# Patient Record
Sex: Female | Born: 1960 | Race: Black or African American | Hispanic: No | Marital: Single | Smoking: Former smoker
Health system: Southern US, Community
[De-identification: ages and names within clinical notes are randomized; demographics above are authoritative.]

## PROBLEM LIST (undated history)

## (undated) DIAGNOSIS — T4145XA Adverse effect of unspecified anesthetic, initial encounter: Secondary | ICD-10-CM

## (undated) DIAGNOSIS — F32A Depression, unspecified: Secondary | ICD-10-CM

## (undated) DIAGNOSIS — F329 Major depressive disorder, single episode, unspecified: Secondary | ICD-10-CM

## (undated) DIAGNOSIS — M797 Fibromyalgia: Secondary | ICD-10-CM

## (undated) DIAGNOSIS — I38 Endocarditis, valve unspecified: Secondary | ICD-10-CM

## (undated) DIAGNOSIS — Z8614 Personal history of Methicillin resistant Staphylococcus aureus infection: Secondary | ICD-10-CM

## (undated) DIAGNOSIS — Z923 Personal history of irradiation: Secondary | ICD-10-CM

## (undated) DIAGNOSIS — K219 Gastro-esophageal reflux disease without esophagitis: Secondary | ICD-10-CM

## (undated) DIAGNOSIS — G4730 Sleep apnea, unspecified: Secondary | ICD-10-CM

## (undated) DIAGNOSIS — J439 Emphysema, unspecified: Secondary | ICD-10-CM

## (undated) DIAGNOSIS — M179 Osteoarthritis of knee, unspecified: Secondary | ICD-10-CM

## (undated) DIAGNOSIS — C7951 Secondary malignant neoplasm of bone: Secondary | ICD-10-CM

## (undated) DIAGNOSIS — R918 Other nonspecific abnormal finding of lung field: Secondary | ICD-10-CM

## (undated) DIAGNOSIS — T8859XA Other complications of anesthesia, initial encounter: Secondary | ICD-10-CM

## (undated) DIAGNOSIS — L309 Dermatitis, unspecified: Secondary | ICD-10-CM

## (undated) DIAGNOSIS — I700 Atherosclerosis of aorta: Secondary | ICD-10-CM

## (undated) DIAGNOSIS — J90 Pleural effusion, not elsewhere classified: Secondary | ICD-10-CM

## (undated) DIAGNOSIS — I10 Essential (primary) hypertension: Secondary | ICD-10-CM

## (undated) DIAGNOSIS — F419 Anxiety disorder, unspecified: Secondary | ICD-10-CM

## (undated) DIAGNOSIS — R011 Cardiac murmur, unspecified: Secondary | ICD-10-CM

## (undated) DIAGNOSIS — R6889 Other general symptoms and signs: Secondary | ICD-10-CM

## (undated) DIAGNOSIS — R0602 Shortness of breath: Secondary | ICD-10-CM

## (undated) DIAGNOSIS — M1710 Unilateral primary osteoarthritis, unspecified knee: Secondary | ICD-10-CM

## (undated) DIAGNOSIS — A159 Respiratory tuberculosis unspecified: Secondary | ICD-10-CM

## (undated) DIAGNOSIS — D649 Anemia, unspecified: Secondary | ICD-10-CM

## (undated) HISTORY — DX: Cardiac murmur, unspecified: R01.1

## (undated) HISTORY — PX: THORACENTESIS: SHX235

## (undated) HISTORY — DX: Other general symptoms and signs: R68.89

## (undated) HISTORY — DX: Personal history of irradiation: Z92.3

## (undated) HISTORY — DX: Secondary malignant neoplasm of bone: C79.51

## (undated) HISTORY — DX: Gastro-esophageal reflux disease without esophagitis: K21.9

## (undated) HISTORY — DX: Pleural effusion, not elsewhere classified: J90

## (undated) HISTORY — DX: Essential (primary) hypertension: I10

---

## 2000-03-22 HISTORY — PX: TUBAL LIGATION: SHX77

## 2006-12-06 ENCOUNTER — Other Ambulatory Visit: Admission: RE | Admit: 2006-12-06 | Discharge: 2006-12-06 | Payer: Self-pay | Admitting: *Deleted

## 2008-11-20 LAB — CONVERTED CEMR LAB

## 2009-12-05 ENCOUNTER — Ambulatory Visit: Payer: Self-pay | Admitting: Family Medicine

## 2009-12-05 DIAGNOSIS — K219 Gastro-esophageal reflux disease without esophagitis: Secondary | ICD-10-CM

## 2009-12-05 DIAGNOSIS — R011 Cardiac murmur, unspecified: Secondary | ICD-10-CM

## 2009-12-05 DIAGNOSIS — I10 Essential (primary) hypertension: Secondary | ICD-10-CM

## 2009-12-05 DIAGNOSIS — E663 Overweight: Secondary | ICD-10-CM

## 2010-04-11 ENCOUNTER — Encounter: Payer: Self-pay | Admitting: Obstetrics and Gynecology

## 2010-04-21 NOTE — Assessment & Plan Note (Signed)
Summary: NEW PT EST (PT REQ CPX W/ PAP) // RS   Vital Signs:  Patient profile:   50 year old female Menstrual status:  regular LMP:     11/17/2009 Height:      66 inches (167.64 cm) Weight:      283 pounds (128.64 kg) BMI:     45.84 O2 Sat:      98 % on Room air Temp:     98.3 degrees F (36.83 degrees C) oral Pulse rate:   102 / minute BP sitting:   124 / 92  (left arm) Cuff size:   large  Vitals Entered By: Josph Macho RMA (December 05, 2009 11:57 AM)  O2 Flow:  Room air CC: Establish new Patient/ physical w/pap/ CF Is Patient Diabetic? No LMP (date): 11/17/2009     Menstrual Status regular Enter LMP: 11/17/2009 Last PAP Result historical   Current Medications (verified): 1)  Nexium 40 Mg Cpdr (Esomeprazole Magnesium) .... Once Daily 2)  Hydrochlorothiazide 25 Mg Tabs (Hydrochlorothiazide) .... Once Daily  Allergies (verified): No Known Drug Allergies   Impression & Recommendations: patient left before exam or completion of visit.  Complete Medication List: 1)  Nexium 40 Mg Cpdr (Esomeprazole magnesium) .... Once daily 2)  Hydrochlorothiazide 25 Mg Tabs (Hydrochlorothiazide) .... Once daily  Other Orders: No Charge Patient Arrived (NCPA0) (NCPA0)  Preventive Care Screening  Pap Smear:    Date:  11/20/2008    Results:  historical   Last Tetanus Booster:    Date:  03/22/2005    Results:  Historical   Mammogram:    Date:  03/23/2003    Results:  historical

## 2010-04-22 ENCOUNTER — Encounter: Payer: Self-pay | Admitting: Obstetrics and Gynecology

## 2010-04-27 ENCOUNTER — Other Ambulatory Visit: Payer: Self-pay | Admitting: Obstetrics & Gynecology

## 2010-04-27 DIAGNOSIS — R928 Other abnormal and inconclusive findings on diagnostic imaging of breast: Secondary | ICD-10-CM

## 2010-10-28 ENCOUNTER — Encounter: Payer: Self-pay | Admitting: Family Medicine

## 2010-10-28 ENCOUNTER — Ambulatory Visit (INDEPENDENT_AMBULATORY_CARE_PROVIDER_SITE_OTHER): Payer: 59 | Admitting: Family Medicine

## 2010-10-28 VITALS — BP 138/96 | HR 120 | Temp 98.6°F | Resp 12 | Ht 65.5 in | Wt 273.0 lb

## 2010-10-28 DIAGNOSIS — M797 Fibromyalgia: Secondary | ICD-10-CM

## 2010-10-28 DIAGNOSIS — K219 Gastro-esophageal reflux disease without esophagitis: Secondary | ICD-10-CM

## 2010-10-28 DIAGNOSIS — IMO0001 Reserved for inherently not codable concepts without codable children: Secondary | ICD-10-CM

## 2010-10-28 DIAGNOSIS — J45909 Unspecified asthma, uncomplicated: Secondary | ICD-10-CM

## 2010-10-28 DIAGNOSIS — I10 Essential (primary) hypertension: Secondary | ICD-10-CM

## 2010-10-28 MED ORDER — PREDNISONE 10 MG PO TABS: ORAL_TABLET | ORAL | Status: AC

## 2010-10-28 NOTE — Progress Notes (Signed)
  Subjective:    Patient ID: Beth [name], female    DOB: 1960/08/12, 50 y.o.   MRN: [id number]  HPI New patient to establish care. Past medical history reviewed. Mild intermittent asthma, GERD, history of heart murmur which she states has been diagnosed with mitral valve prolapse, hypertension, and recently diagnosed fibromyalgia. She is seeing rheumatologist and placed on some type of muscle relaxer. No consistent exercise. All medications reviewed. Blood pressures marginal control. Takes Singulair for asthma. Currently has some wheezing for past few days. Previously been on some type of inhaler. She does smoke about 2 cigarettes per day.  Family history significant for father alcoholism. Hypertension in several members.  Patient is divorced. One grown child. She works full time. Smoking history as above. One glass of wine per day.   Review of Systems  Constitutional: Positive for fatigue. Negative for fever, activity change, appetite change and unexpected weight change.  HENT: Negative for sore throat and trouble swallowing.   Respiratory: Positive for wheezing. Negative for cough and shortness of breath.   Cardiovascular: Negative for chest pain, palpitations and leg swelling.  Gastrointestinal: Negative for abdominal pain and blood in stool.  Genitourinary: Negative for dysuria.  Musculoskeletal: Positive for myalgias. Negative for back pain, joint swelling, arthralgias and gait problem.  Neurological: Negative for dizziness, syncope, weakness and headaches.       Objective:   Physical Exam  Constitutional: She is oriented to person, place, and time. She appears well-developed and well-nourished. No distress.  HENT:  Right Ear: External ear normal.  Left Ear: External ear normal.  Mouth/Throat: Oropharynx is clear and moist.  Eyes: Pupils are equal, round, and reactive to light.  Neck: Neck supple. No thyromegaly present.  Cardiovascular: Normal rate and regular rhythm.  Exam  reveals no gallop.        Patient is murmur heard best at the left sternal border. Holosystolic and 3/6  Pulmonary/Chest: Effort normal and breath sounds normal. No respiratory distress. She has no wheezes. She has no rales.  Musculoskeletal: She exhibits no edema.  Lymphadenopathy:    She has no cervical adenopathy.  Neurological: She is alert and oriented to person, place, and time. No cranial nerve deficit.  Psychiatric: She has a normal mood and affect. Her behavior is normal.          Assessment & Plan:  #1 history of fibromyalgia. Patient requesting forms completed for work for periodic leave secondary medical issues. These are completed. We discussed importance of regular physical activity and exercise #2 asthma with current mild exacerbation. Prednisone taper written. Consider steroid inhaler if this becomes more frequent #3 history of reported mitral valve prolapse #4 history of GERD controlled and stable #5 hypertension stable. Needs to work on weight loss #6 health maintenance. Schedule complete physical 3 months

## 2010-10-30 ENCOUNTER — Telehealth: Payer: Self-pay | Admitting: Family Medicine

## 2010-10-30 NOTE — Telephone Encounter (Signed)
This WILL be completed by Monday.

## 2010-10-30 NOTE — Telephone Encounter (Signed)
Pt was following up on FMLA paper work. Pt requesting paper work by August 26.

## 2010-11-05 DIAGNOSIS — Z0279 Encounter for issue of other medical certificate: Secondary | ICD-10-CM

## 2010-11-27 ENCOUNTER — Telehealth: Payer: Self-pay | Admitting: Family Medicine

## 2010-11-27 MED ORDER — PREDNISONE 10 MG PO TABS: 10.0000 mg | ORAL_TABLET | ORAL | Status: AC

## 2010-11-27 NOTE — Telephone Encounter (Signed)
Prednisone taper 10 mg :  4-4-4-3-3-2-2-1 and office follow up if no better.

## 2010-11-27 NOTE — Telephone Encounter (Signed)
Pt.notified

## 2010-11-27 NOTE — Telephone Encounter (Signed)
Pt is asking if Dr Caryl Never will call in Prednisone for an asthma flare.

## 2010-12-29 ENCOUNTER — Telehealth: Payer: Self-pay | Admitting: Family Medicine

## 2010-12-29 NOTE — Telephone Encounter (Signed)
Pt has Hx of 5 mg and 10 mg disp on 10-28-10 and 11-27-10?

## 2010-12-29 NOTE — Telephone Encounter (Signed)
Pt informed on VM phone

## 2010-12-29 NOTE — Telephone Encounter (Signed)
Pt requesting refill on predniSONE (DELTASONE) 5 MG tablet    [facility]

## 2010-12-29 NOTE — Telephone Encounter (Signed)
Needs office follow up.  If requiring repeat prednisone will likely need steroid inhaler.  Schedule to discuss.

## 2011-01-12 ENCOUNTER — Telehealth: Payer: Self-pay | Admitting: Family Medicine

## 2011-01-12 NOTE — Telephone Encounter (Signed)
Only albuterol inhalers are acute relievers (eg ProAir, Ventolin, Proventil).  OK to give her one sample if available. Qvar, Pulmicort, Advair are daily controller medications.  Whether to use meds like Qvar hinges on severity of her asthma and I do not have a good feel at this time.  She really needs office visit to reassess asthma as soon as possible when she gets her insurance.

## 2011-01-12 NOTE — Telephone Encounter (Signed)
I spoke with pt, has hx of asthma and [facility] had treated her with Ventolin Inhaler.  Currently she is supposed to be taking Singuliar, however she ran out several weeks ago.  She does not have any insurance until Jan 21, 2011.  I asked her to call her pharmacy and ask how much 10-15 pills would cost out of pocket.  Pt requesting a sample inhaller, samples available here are Qvar, Pulmicort and Advair 100/50

## 2011-01-12 NOTE — Telephone Encounter (Signed)
Pt called and is req sample ventilator for asthma. Pt has sch an ov for 11/1 re: asthma and bronchitus issues. Pt did not want to come in any sooner.

## 2011-01-13 NOTE — Telephone Encounter (Signed)
Pt informed, she did get Singuliar #30 for $9.00, so she is all set until OV

## 2011-01-21 ENCOUNTER — Ambulatory Visit (INDEPENDENT_AMBULATORY_CARE_PROVIDER_SITE_OTHER): Payer: 59 | Admitting: Family Medicine

## 2011-01-21 ENCOUNTER — Encounter: Payer: Self-pay | Admitting: Family Medicine

## 2011-01-21 VITALS — BP 130/96 | Temp 98.4°F | Wt 258.0 lb

## 2011-01-21 DIAGNOSIS — R059 Cough, unspecified: Secondary | ICD-10-CM

## 2011-01-21 DIAGNOSIS — I10 Essential (primary) hypertension: Secondary | ICD-10-CM

## 2011-01-21 DIAGNOSIS — K219 Gastro-esophageal reflux disease without esophagitis: Secondary | ICD-10-CM

## 2011-01-21 DIAGNOSIS — Z23 Encounter for immunization: Secondary | ICD-10-CM

## 2011-01-21 DIAGNOSIS — Z8659 Personal history of other mental and behavioral disorders: Secondary | ICD-10-CM

## 2011-01-21 DIAGNOSIS — R05 Cough: Secondary | ICD-10-CM

## 2011-01-21 MED ORDER — VENLAFAXINE HCL ER 150 MG PO TB24: 150.0000 mg | ORAL_TABLET | Freq: Every day | ORAL | Status: DC

## 2011-01-21 NOTE — Progress Notes (Signed)
  Subjective:    Patient ID: Beth [name], female    DOB: 09/15/1960, 50 y.o.   MRN: [id number]  HPI  Here for several issues. History of asthma. She recently quit smoking 2 weeks ago. Has continued dry cough. Singulair helps but still has frequent night coughing and she is aware of some wheezing at night. No dyspnea with activity. No history of steroid inhaler use.  Hypertension. Takes HCTZ. Currently in process of losing some weight and lost about 15 pounds this last August.  History reflux. As she's lost weight reflux has improved. She was able to taper off Nexium recently.  History of depression. Currently stable. Takes generic Effexor and requesting refills. She also has history of fibromyalgia and this seems to help with her fibromyalgia pain.   Review of Systems  Constitutional: Negative for fever and chills.  HENT: Negative for ear pain.   Respiratory: Positive for cough and wheezing. Negative for shortness of breath.   Cardiovascular: Negative for chest pain, palpitations and leg swelling.  Neurological: Negative for dizziness and headaches.       Objective:   Physical Exam  Constitutional: She is oriented to person, place, and time. She appears well-developed and well-nourished.  Neck: Neck supple. No thyromegaly present.  Cardiovascular: Normal rate and regular rhythm.   No murmur heard. Pulmonary/Chest: Effort normal and breath sounds normal. No respiratory distress. She has no wheezes. She has no rales.  Musculoskeletal: She exhibits no edema.  Lymphadenopathy:    She has no cervical adenopathy.  Neurological: She is alert and oriented to person, place, and time.          Assessment & Plan:  #1 hypertension. Mildly elevated today. Did not take blood pressure medication. Should continue to improve with weight loss. No additional meds at this time. Reassess at physical in 2 months #2 history of asthma. Probably moderate persistent. Add Pulmicort 180 mg one puff  twice daily with instructions given. Continue Singulair. Reassess at followup #3 history of GERD stable and improved. Continue tapering off Nexium  #4 history of depression stable refill generic Effexor for one year

## 2011-01-21 NOTE — Patient Instructions (Signed)
Pulmicort one puff twice daily and rinse mouth after use.

## 2011-01-28 ENCOUNTER — Ambulatory Visit: Payer: 59 | Admitting: Family Medicine

## 2011-03-11 ENCOUNTER — Telehealth: Payer: Self-pay | Admitting: Family Medicine

## 2011-03-11 MED ORDER — ESOMEPRAZOLE MAGNESIUM 40 MG PO CPDR: 40.0000 mg | DELAYED_RELEASE_CAPSULE | Freq: Every day | ORAL | Status: DC

## 2011-03-11 NOTE — Telephone Encounter (Signed)
Pt req full script of Nexium called in to [facility] [address].

## 2011-03-17 ENCOUNTER — Other Ambulatory Visit: Payer: 59

## 2011-03-23 ENCOUNTER — Encounter (HOSPITAL_COMMUNITY): Payer: Self-pay | Admitting: *Deleted

## 2011-03-23 ENCOUNTER — Emergency Department (HOSPITAL_COMMUNITY)
Admission: EM | Admit: 2011-03-23 | Discharge: 2011-03-23 | Disposition: A | Discharge status: Home / Self Care | Payer: 59 | Source: Home / Self Care | Attending: Emergency Medicine | Admitting: Emergency Medicine

## 2011-03-23 ENCOUNTER — Emergency Department (INDEPENDENT_AMBULATORY_CARE_PROVIDER_SITE_OTHER): Payer: 59

## 2011-03-23 DIAGNOSIS — M25461 Effusion, right knee: Secondary | ICD-10-CM

## 2011-03-23 DIAGNOSIS — M25469 Effusion, unspecified knee: Secondary | ICD-10-CM

## 2011-03-23 HISTORY — DX: Unilateral primary osteoarthritis, unspecified knee: M17.10

## 2011-03-23 HISTORY — DX: Fibromyalgia: M79.7

## 2011-03-23 HISTORY — DX: Osteoarthritis of knee, unspecified: M17.9

## 2011-03-23 MED ORDER — IBUPROFEN 800 MG PO TABS: 800.0000 mg | ORAL_TABLET | Freq: Once | ORAL | Status: DC

## 2011-03-23 MED ORDER — ACETAMINOPHEN 500 MG PO TABS: 500.0000 mg | ORAL_TABLET | Freq: Four times a day (QID) | ORAL | Status: AC | PRN

## 2011-03-23 MED ORDER — IBUPROFEN 600 MG PO TABS: 600.0000 mg | ORAL_TABLET | Freq: Four times a day (QID) | ORAL | Status: DC | PRN

## 2011-03-23 MED ORDER — HYDROCODONE-ACETAMINOPHEN 5-325 MG PO TABS: 2.0000 | ORAL_TABLET | ORAL | Status: AC | PRN

## 2011-03-23 MED ORDER — IBUPROFEN 800 MG PO TABS
ORAL_TABLET | ORAL | Status: AC
  Filled 2011-03-23: qty 1

## 2011-03-23 NOTE — ED Provider Notes (Signed)
History     CSN: [id number]  Arrival date & time 03/23/11  1655   First MD Initiated Contact with Patient 03/23/11 1722      Chief Complaint  Patient presents with  . Knee Pain    HPI Comments: Pt with right knee swelling, achy dull nonradiating posterior patellar pain x 5 days. States just got a new dog and has been walking him frequently which is an increase in her normal physical activity. Went for long walk other day and last night c/o worsening of knee pain. C/o "popping" in knee espeially with walking. Tried 400 mg ibu w/o relief last night, BC powder today. No N/V, fevers, numbness, weakness, giving way. H/o injury to this knee told she had DJD. No h/o DM, steriod use.   Patient is a 51 y.o. female presenting with knee pain. The history is provided by the patient.  Knee Pain This is a new problem. The current episode started more than 2 days ago. The problem occurs constantly. The problem has been gradually worsening. The symptoms are aggravated by walking. The symptoms are relieved by nothing.    Past Medical History  Diagnosis Date  . Asthma   . GERD (gastroesophageal reflux disease)   . Hypertension   . Heart murmur   . Fibromyalgia   . Arthritis of knee, degenerative     History reviewed. No pertinent past surgical history.  Family History  Problem Relation Age of Onset  . Hypertension Mother   . Alcohol abuse Father   . Hypertension Maternal Aunt   . Hypertension Maternal Uncle   . Hypertension Maternal Grandmother   . Hypertension Maternal Grandfather     History  Substance Use Topics  . Smoking status: Former Smoker -- 0.5 packs/day for 15 years    Types: Cigarettes    Quit date: 01/01/2011  . Smokeless tobacco: Not on file  . Alcohol Use: No    OB History    Grav Para Term Preterm Abortions TAB SAB Ect Mult Living                  Review of Systems  Constitutional: Negative for fever.  Gastrointestinal: Negative for nausea.  Musculoskeletal:  Positive for joint swelling and arthralgias.  Skin: Negative for color change and rash.  Neurological: Negative for weakness and numbness.    Allergies  Mobic  Home Medications   Current Outpatient Rx  Name Route Sig Dispense Refill  . ESOMEPRAZOLE MAGNESIUM 40 MG PO CPDR Oral Take 1 capsule (40 mg total) by mouth daily before breakfast. 30 capsule 11  . HYDROCHLOROTHIAZIDE 25 MG PO TABS Oral Take 25 mg by mouth daily.     Marland Kitchen MONTELUKAST SODIUM 10 MG PO TABS Oral Take 10 mg by mouth at bedtime.      . VENLAFAXINE HCL ER 150 MG PO TB24 Oral Take 1 tablet (150 mg total) by mouth daily. 30 each 11  . ACETAMINOPHEN 500 MG PO TABS Oral Take 1 tablet (500 mg total) by mouth every 6 (six) hours as needed for pain. 30 tablet 0  . HYDROCODONE-ACETAMINOPHEN 5-325 MG PO TABS Oral Take 2 tablets by mouth every 4 (four) hours as needed for pain. 20 tablet 0  . IBUPROFEN 600 MG PO TABS Oral Take 1 tablet (600 mg total) by mouth every 6 (six) hours as needed for pain. 30 tablet 0    BP 137/82  Pulse 100  Temp(Src) 98.1 F (36.7 C) (Oral)  Resp 18  SpO2  100%  LMP 03/06/2011  Physical Exam  Nursing note and vitals reviewed. Constitutional: She is oriented to person, place, and time. She appears well-developed and well-nourished. No distress.  HENT:  Head: Normocephalic and atraumatic.  Eyes: EOM are normal. Pupils are equal, round, and reactive to light.  Neck: Normal range of motion.  Cardiovascular: Regular rhythm.   Pulmonary/Chest: Effort normal and breath sounds normal.  Musculoskeletal: Normal range of motion.       Right Knee ROM baseline for Pt, Flexion/extension  intact, Tenderness along patellar retinaculum Patella NT, Patellar apprehension test negative, Patellar tendon NT, Medial joint mildly tender, Lateral joint NT, Popliteal region NT, Lachman's stable, Varus stress testing stable, Valgus stress testing stable, McMurray's testing normal, distal NVI with intact baseline sensation /  motor / pulse distal to knee. (+) effusion.    Neurological: She is alert and oriented to person, place, and time.  Skin: Skin is warm and dry.  Psychiatric: She has a normal mood and affect. Her behavior is normal. Judgment and thought content normal.    ED Course  Procedures (including critical care time)  Labs Reviewed - No data to display Dg Knee Complete 4 Views Right  03/23/2011  *RADIOLOGY REPORT*  Clinical Data: Post patellar knee pain.  Rule out effusion, fracture.  Pain but no trauma.  Question of fluid per patient.  RIGHT KNEE - COMPLETE 4+ VIEW  Comparison: None.  Findings: There is a large joint effusion.  Moderate degenerative changes involve the medial, lateral, and patellofemoral compartments.  No evidence for acute fracture or subluxation.  IMPRESSION:  1.  Moderate degenerative changes. 2.  Large joint effusion. 3.  No evidence for fracture.  Original Report Authenticated By: Patterson Hammersmith, M.D.     1. Knee effusion, right       MDM  Suspect traumatic effusion with increased physical activity and h/o DJD in this knee. No fevers, minimal erythema, pt able to tolerate AROM and PROM, septic joint unlikely. No h/o gout. Checking XR to r/o fx.  Imaging reviewed by myself. Report per radiologist. Discussed imaging with pt. Applied knee immobilizer, crutches WBAT, instructed pt on ice, nsaid/ norco prn, and f/u with ortho Dr. Shon Baton on call, or [facility] in 7 days if no improvement. Pt agrees with plan   Luiz Blare, MD 03/23/11 2218

## 2011-03-23 NOTE — ED Notes (Signed)
Pt with onset of right knee pain x 5 days swelling onset last night with increased pain

## 2011-03-24 ENCOUNTER — Encounter: Payer: 59 | Admitting: Family Medicine

## 2011-04-01 ENCOUNTER — Other Ambulatory Visit: Payer: Self-pay | Admitting: Family Medicine

## 2011-04-01 ENCOUNTER — Ambulatory Visit: Payer: 59 | Admitting: Family Medicine

## 2011-04-06 ENCOUNTER — Other Ambulatory Visit: Payer: Self-pay | Admitting: Family Medicine

## 2011-04-06 NOTE — Telephone Encounter (Signed)
Pt would like a refill on ibuprofen 600 mg for pain call [facility] [phone]. Pt was prescribed med at [facility] on 03-23-2011.

## 2011-04-06 NOTE — Telephone Encounter (Signed)
May refill Ibuprofen 600 mg po q 6 hours prn pain #60 with no refill.

## 2011-04-07 ENCOUNTER — Other Ambulatory Visit: Payer: Self-pay | Admitting: *Deleted

## 2011-04-07 MED ORDER — IBUPROFEN 600 MG PO TABS: 600.0000 mg | ORAL_TABLET | Freq: Four times a day (QID) | ORAL | Status: DC | PRN

## 2011-04-07 NOTE — Telephone Encounter (Signed)
Pt left a VM for me also, this was filled earlier today #30 with 0 refills

## 2011-04-12 ENCOUNTER — Ambulatory Visit (INDEPENDENT_AMBULATORY_CARE_PROVIDER_SITE_OTHER): Payer: 59 | Admitting: Family Medicine

## 2011-04-12 ENCOUNTER — Encounter: Payer: Self-pay | Admitting: Family Medicine

## 2011-04-12 VITALS — BP 110/80 | HR 80 | Temp 98.3°F | Resp 12 | Ht 66.0 in | Wt 250.0 lb

## 2011-04-12 DIAGNOSIS — J45909 Unspecified asthma, uncomplicated: Secondary | ICD-10-CM

## 2011-04-12 DIAGNOSIS — I10 Essential (primary) hypertension: Secondary | ICD-10-CM

## 2011-04-12 DIAGNOSIS — J4520 Mild intermittent asthma, uncomplicated: Secondary | ICD-10-CM

## 2011-04-12 DIAGNOSIS — Z0000 Encounter for general adult medical examination without abnormal findings: Secondary | ICD-10-CM

## 2011-04-12 LAB — HEPATIC FUNCTION PANEL
ALT: 11 U/L (ref 0–35)
AST: 14 U/L (ref 0–37)
Albumin: 3.5 g/dL (ref 3.5–5.2)
Alkaline Phosphatase: 91 U/L (ref 39–117)
Bilirubin, Direct: 0 mg/dL (ref 0.0–0.3)
Total Bilirubin: 0.3 mg/dL (ref 0.3–1.2)
Total Protein: 7.7 g/dL (ref 6.0–8.3)

## 2011-04-12 LAB — CBC WITH DIFFERENTIAL/PLATELET
Basophils Absolute: 0 K/uL (ref 0.0–0.1)
Basophils Relative: 0.5 % (ref 0.0–3.0)
Eosinophils Absolute: 0.4 K/uL (ref 0.0–0.7)
Eosinophils Relative: 5.2 % — ABNORMAL HIGH (ref 0.0–5.0)
HCT: 31.2 % — ABNORMAL LOW (ref 36.0–46.0)
Hemoglobin: 10.4 g/dL — ABNORMAL LOW (ref 12.0–15.0)
Lymphocytes Relative: 30.5 % (ref 12.0–46.0)
Lymphs Abs: 2.1 K/uL (ref 0.7–4.0)
MCHC: 33.5 g/dL (ref 30.0–36.0)
MCV: 88.6 fl (ref 78.0–100.0)
Monocytes Absolute: 0.5 K/uL (ref 0.1–1.0)
Monocytes Relative: 7.9 % (ref 3.0–12.0)
Neutro Abs: 3.8 K/uL (ref 1.4–7.7)
Neutrophils Relative %: 55.9 % (ref 43.0–77.0)
Platelets: 360 K/uL (ref 150.0–400.0)
RBC: 3.52 Mil/uL — ABNORMAL LOW (ref 3.87–5.11)
RDW: 14.9 % — ABNORMAL HIGH (ref 11.5–14.6)
WBC: 6.8 K/uL (ref 4.5–10.5)

## 2011-04-12 LAB — BASIC METABOLIC PANEL WITH GFR
BUN: 15 mg/dL (ref 6–23)
CO2: 26 meq/L (ref 19–32)
Calcium: 8.9 mg/dL (ref 8.4–10.5)
Chloride: 106 meq/L (ref 96–112)
Creatinine, Ser: 0.6 mg/dL (ref 0.4–1.2)
GFR: 110.39 mL/min
Glucose, Bld: 90 mg/dL (ref 70–99)
Potassium: 3.4 meq/L — ABNORMAL LOW (ref 3.5–5.1)
Sodium: 140 meq/L (ref 135–145)

## 2011-04-12 LAB — LIPID PANEL
HDL: 52.6 mg/dL (ref >39.00)
Total CHOL/HDL Ratio: 3
Triglycerides: 39 mg/dL (ref 0.0–149.0)

## 2011-04-12 MED ORDER — CYCLOBENZAPRINE HCL 5 MG PO TABS: 5.0000 mg | ORAL_TABLET | Freq: Every evening | ORAL | Status: AC | PRN

## 2011-04-12 NOTE — Progress Notes (Signed)
Subjective:    Patient ID: Beth [name], female    DOB: 1960-07-16, 51 y.o.   MRN: [id number]  HPI  Patient sent for complete physical. She continues to see gynecologist regularly and plans to schedule followup soon. Her chronic problems include history of obesity, hypertension, GERD, fibromyalgia, and history of depression. She is requesting forms be completed for periodic absence from work for flareups of her fibromyalgia. She recently required missing 3 days earlier this month. This occurs about once every 6 months. She is currently not taking any fibromyalgia specific drugs other than nonsteroidal with ibuprofen which helps somewhat. She does have in general poor sleep quality. She does not recall she's tried low dose muscle relaxers in the past.  She also has history of mild intermittent asthma with no recent flareups. She is requesting papers be completed in the event that she needs to miss work because of this.  Patient turns 51 this year. No history of screening colonoscopy. Due for repeat mammogram. No recent lab work. Tetanus up to date. Plans to see gynecologist for ongoing Pap smears  Past Medical History  Diagnosis Date  . Asthma   . GERD (gastroesophageal reflux disease)   . Hypertension   . Heart murmur   . Fibromyalgia   . Arthritis of knee, degenerative    No past surgical history on file.  reports that she quit smoking about 3 months ago. Her smoking use included Cigarettes. She has a 7.5 pack-year smoking history. She does not have any smokeless tobacco history on file. She reports that she does not drink alcohol or use illicit drugs. family history includes Alcohol abuse in her father and Hypertension in her maternal aunt, maternal grandfather, maternal grandmother, maternal uncle, and mother. Allergies  Allergen Reactions  . Mobic     Possible GI bleed      Review of Systems  Constitutional: Positive for fatigue. Negative for fever, activity change, appetite  change and unexpected weight change.  HENT: Negative for hearing loss, ear pain, sore throat and trouble swallowing.   Eyes: Negative for visual disturbance.  Respiratory: Negative for cough and shortness of breath.   Cardiovascular: Negative for chest pain and palpitations.  Gastrointestinal: Negative for vomiting, abdominal pain, diarrhea, constipation, blood in stool and abdominal distention.  Genitourinary: Negative for dysuria and hematuria.  Musculoskeletal: Positive for myalgias (intermittently) and arthralgias (occasional right knee pains). Negative for back pain.  Skin: Negative for rash.  Neurological: Negative for dizziness, syncope and headaches.  Hematological: Negative for adenopathy.  Psychiatric/Behavioral: Negative for confusion and dysphoric mood.       Objective:   Physical Exam  Constitutional: She is oriented to person, place, and time. She appears well-developed and well-nourished.  HENT:  Head: Normocephalic and atraumatic.  Right Ear: External ear normal.  Left Ear: External ear normal.  Mouth/Throat: Oropharynx is clear and moist.  Eyes: EOM are normal. Pupils are equal, round, and reactive to light.  Neck: Normal range of motion. Neck supple. No thyromegaly present.  Cardiovascular: Normal rate, regular rhythm and normal heart sounds.   No murmur heard. Pulmonary/Chest: Breath sounds normal. No respiratory distress. She has no wheezes. She has no rales.  Abdominal: Soft. Bowel sounds are normal. She exhibits no distension and no mass. There is no tenderness. There is no rebound and no guarding.  Genitourinary:       Breast and pelvic exam per GYN  Musculoskeletal: Normal range of motion. She exhibits no edema.  Lymphadenopathy:    She  has no cervical adenopathy.  Neurological: She is alert and oriented to person, place, and time. She displays normal reflexes. No cranial nerve deficit.  Skin: No rash noted.  Psychiatric: She has a normal mood and affect.  Her behavior is normal. Judgment and thought content normal.          Assessment & Plan:  #1 Health maintenance. Obtain screening lab work. She is strongly encouraged to see gynecologist for repeat mammogram and Pap smear. Schedule screening colonoscopy after age 64. Work on weight loss and more consistent aerobic exercise #2 history of fibromyalgia.  paper work completed. Trial of low-dose cyclobenzaprine 5 mg each bedtime and we discussed other potential options such as Lyrica or Cymbalta and she is not interested at this time

## 2011-04-12 NOTE — Patient Instructions (Signed)
Schedule gyn and mammogram follow up.

## 2011-04-13 ENCOUNTER — Other Ambulatory Visit: Payer: Self-pay | Admitting: Family Medicine

## 2011-04-13 DIAGNOSIS — D649 Anemia, unspecified: Secondary | ICD-10-CM

## 2011-04-13 LAB — VITAMIN D 25 HYDROXY (VIT D DEFICIENCY, FRACTURES): Vit D, 25-Hydroxy: 27 ng/mL — ABNORMAL LOW (ref 30–89)

## 2011-04-13 MED ORDER — VITAMIN D (ERGOCALCIFEROL) 1.25 MG (50000 UNIT) PO CAPS: 50000.0000 [IU] | ORAL_CAPSULE | ORAL | Status: DC

## 2011-04-13 NOTE — Progress Notes (Signed)
Quick Note:  Labs mailed to pt home with instructions, Rx ordered, future lab also ______

## 2011-04-13 NOTE — Progress Notes (Signed)
Quick Note:  Labs mailed to pt home with instructions highlighted, future labs ordered ______

## 2011-04-26 ENCOUNTER — Other Ambulatory Visit: Payer: Self-pay | Admitting: Family Medicine

## 2011-05-10 ENCOUNTER — Telehealth: Payer: Self-pay | Admitting: *Deleted

## 2011-05-10 DIAGNOSIS — M25561 Pain in right knee: Secondary | ICD-10-CM

## 2011-05-10 NOTE — Telephone Encounter (Signed)
VM from pt requesting referral to Ortho for "a knee that keeps going out".

## 2011-05-10 NOTE — Telephone Encounter (Signed)
OK to refer to [facility] orth[facility].

## 2011-05-11 NOTE — Telephone Encounter (Signed)
We cannot refill opioid for acute problem we have not evaluated.

## 2011-05-11 NOTE — Telephone Encounter (Signed)
Will make referral when pt calls back with what knee R or L

## 2011-05-11 NOTE — Telephone Encounter (Signed)
Right knee referral made, pt informed.  Pt requesting refill of Hydrocodone for knee pain she was given at Urgent Care.  She reports she is taking Ibuprofen however she still has swelling and pain.  I explained Dr Caryl Never was gone for the day, I need authorization to refill.  Pt not happy and said she may just need to go back to Urgent Care to get it filled.

## 2011-05-12 ENCOUNTER — Telehealth: Payer: Self-pay | Admitting: Family Medicine

## 2011-05-12 NOTE — Telephone Encounter (Signed)
FMLA paperwork

## 2011-05-12 NOTE — Telephone Encounter (Signed)
Please clarify. I am not aware of [facility]

## 2011-05-12 NOTE — Telephone Encounter (Signed)
Pt informed on her cell phone VM

## 2011-05-12 NOTE — Telephone Encounter (Signed)
I printed FMLA paperwork I think she is referring to and put It on your desk

## 2011-05-12 NOTE — Telephone Encounter (Signed)
Patient called stating that doctor gave her 2 times per year for flare-ups of fibromyalgia and it should have been 2 times per week. Patient asks that the md update the info and resubmit. Please inform patient when complete. Patient also stated that the md gave her a referral to [facility] but she prefers [facility]. Please advise and inform patient of advice.

## 2011-05-12 NOTE — Telephone Encounter (Signed)
Pt informed on cell VM 

## 2011-05-17 ENCOUNTER — Ambulatory Visit: Payer: 59 | Admitting: Family Medicine

## 2011-05-17 ENCOUNTER — Ambulatory Visit (INDEPENDENT_AMBULATORY_CARE_PROVIDER_SITE_OTHER): Payer: 59 | Admitting: Family Medicine

## 2011-05-17 ENCOUNTER — Encounter: Payer: Self-pay | Admitting: Family Medicine

## 2011-05-17 VITALS — BP 150/100 | Temp 98.7°F | Wt 252.0 lb

## 2011-05-17 DIAGNOSIS — M797 Fibromyalgia: Secondary | ICD-10-CM

## 2011-05-17 DIAGNOSIS — IMO0001 Reserved for inherently not codable concepts without codable children: Secondary | ICD-10-CM

## 2011-05-17 MED ORDER — CYCLOBENZAPRINE HCL 10 MG PO TABS: 10.0000 mg | ORAL_TABLET | Freq: Three times a day (TID) | ORAL | Status: AC | PRN

## 2011-05-17 MED ORDER — VENLAFAXINE HCL ER 150 MG PO TB24: 150.0000 mg | ORAL_TABLET | Freq: Every day | ORAL | Status: DC

## 2011-05-17 MED ORDER — ESOMEPRAZOLE MAGNESIUM 40 MG PO CPDR: 40.0000 mg | DELAYED_RELEASE_CAPSULE | Freq: Every day | ORAL | Status: DC

## 2011-05-17 NOTE — Progress Notes (Signed)
  Subjective:    Patient ID: Beth [name], female    DOB: [birth year], 51 y.o.   MRN: [id number]  HPI  Here to discuss fibromyalgia issues.  Some improvement with Flexeril at 10 mg dose.  Still has frequent symptoms.  Generally sleeping well.  On Effexor for mood issues.  Have discussed other fibromyalgia meds such as Lyrica and Cymbalta.   She has not taken any tricyclics.  Requesting FMLA papers reflect possible need to be out of work up to 2 times per week.  No recent change in symptoms. Exercise with walking.  Past Medical History  Diagnosis Date  . Asthma   . GERD (gastroesophageal reflux disease)   . Hypertension   . Heart murmur   . Fibromyalgia   . Arthritis of knee, degenerative    No past surgical history on file.  reports that she quit smoking about 4 months ago. Her smoking use included Cigarettes. She has a 7.5 pack-year smoking history. She does not have any smokeless tobacco history on file. She reports that she does not drink alcohol or use illicit drugs. family history includes Alcohol abuse in her father and Hypertension in her maternal aunt, maternal grandfather, maternal grandmother, maternal uncle, and mother. Allergies  Allergen Reactions  . Mobic     Possible GI bleed      Review of Systems  Constitutional: Positive for fatigue. Negative for appetite change and unexpected weight change.  Respiratory: Negative for shortness of breath.   Cardiovascular: Negative for chest pain.  Musculoskeletal: Positive for myalgias.       Objective:   Physical Exam  Constitutional: She appears well-developed and well-nourished.  Cardiovascular: Normal rate and regular rhythm.   Pulmonary/Chest: Effort normal and breath sounds normal. No respiratory distress. She has no wheezes. She has no rales.  Musculoskeletal: She exhibits no edema.          Assessment & Plan:  Fibromyalgia.  Discussed treatment options.  She is reluctant to make med changes at this time.  Try  to get more exercise.  Papers completed.

## 2011-05-24 ENCOUNTER — Encounter: Payer: Self-pay | Admitting: Family Medicine

## 2011-05-24 ENCOUNTER — Ambulatory Visit (INDEPENDENT_AMBULATORY_CARE_PROVIDER_SITE_OTHER): Payer: 59 | Admitting: Family Medicine

## 2011-05-24 VITALS — BP 130/90 | Temp 98.6°F | Wt 247.0 lb

## 2011-05-24 DIAGNOSIS — F329 Major depressive disorder, single episode, unspecified: Secondary | ICD-10-CM

## 2011-05-24 DIAGNOSIS — R0989 Other specified symptoms and signs involving the circulatory and respiratory systems: Secondary | ICD-10-CM

## 2011-05-24 DIAGNOSIS — R0600 Dyspnea, unspecified: Secondary | ICD-10-CM

## 2011-05-24 LAB — BASIC METABOLIC PANEL
Chloride: 102 mEq/L (ref 96–112)
Creatinine, Ser: 0.6 mg/dL (ref 0.4–1.2)
Potassium: 3.5 mEq/L (ref 3.5–5.1)
Sodium: 138 mEq/L (ref 135–145)

## 2011-05-24 LAB — CBC WITH DIFFERENTIAL/PLATELET
Basophils Absolute: 0 10*3/uL (ref 0.0–0.1)
Eosinophils Absolute: 0.4 10*3/uL (ref 0.0–0.7)
Lymphocytes Relative: 30.6 % (ref 12.0–46.0)
MCHC: 32.4 g/dL (ref 30.0–36.0)
Monocytes Relative: 5.5 % (ref 3.0–12.0)
Neutrophils Relative %: 59.1 % (ref 43.0–77.0)
Platelets: 428 10*3/uL — ABNORMAL HIGH (ref 150.0–400.0)
RDW: 15.3 % — ABNORMAL HIGH (ref 11.5–14.6)

## 2011-05-24 MED ORDER — ARIPIPRAZOLE 2 MG PO TABS: 2.0000 mg | ORAL_TABLET | Freq: Every day | ORAL | Status: DC

## 2011-05-24 NOTE — Progress Notes (Signed)
  Subjective:    Patient ID: Beth [name], female    DOB: 1960-03-30, 51 y.o.   MRN: [id number]  HPI  Patient seen with issues of progressive fatigue. She has known history of fibromyalgia but feels this is more. She has felt more depressed recently. She has history of depression currently treated with Effexor. She's had progressive depressed mood, decreased appetite with 5 pound weight loss in the past week with loss of interest in activities, difficulty focusing, and early morning awakening. No suicidal ideation. Previously on Prozac without relief. She has not any recent counseling. Recent thyroid function normal.  She has increased fatigue. Recent labs revealed low hemoglobin of 10.4 low vitamin D level and low potassium. She needs repeat of all these. She is taking multivitamin with iron and also vitamin D replacement.  Other issue is she's had some recent dyspnea. No chest pain. Intermittent dry cough for several weeks. She has sister with sarcoidosis is concerned she may have the same. She has not had any hemoptysis. No pleuritic pain. No parotid enlargement. No history of hypercalcemia.   No recent chest x-ray.  Patient requesting short-term disability to get her depression stable. She is having great difficulty focusing at this time.  Review of Systems  Constitutional: Positive for appetite change, fatigue and unexpected weight change. Negative for fever and chills.  Eyes: Negative for visual disturbance.  Respiratory: Positive for cough and shortness of breath. Negative for wheezing.   Cardiovascular: Negative for chest pain, palpitations and leg swelling.  Gastrointestinal: Negative for abdominal pain.  Genitourinary: Negative for dysuria.  Neurological: Negative for dizziness, syncope, weakness and headaches.  Psychiatric/Behavioral: Positive for sleep disturbance and dysphoric mood. Negative for suicidal ideas. The patient is nervous/anxious.        Objective:   Physical Exam   Constitutional: She is oriented to person, place, and time. She appears well-developed and well-nourished.  HENT:  Mouth/Throat: Oropharynx is clear and moist.  Neck: Neck supple. No thyromegaly present.  Cardiovascular: Normal rate and regular rhythm.   Pulmonary/Chest: Effort normal and breath sounds normal. No respiratory distress. She has no wheezes. She has no rales.  Musculoskeletal: She exhibits no edema.  Lymphadenopathy:    She has no cervical adenopathy.  Neurological: She is alert and oriented to person, place, and time. No cranial nerve deficit.  Psychiatric:       Somewhat depressed mood. Tearful off and on during the interview          Assessment & Plan:  #1 depression. Not well controlled on Effexor 150 mg. Discussed options. We have elected to add Abilify 2 mg daily to her regimen and reassess in one month. Patient given names of local counselors #2 fatigue. Possibly related to 1. Likely multifactorial. Recheck hemoglobin, vitamin D, and electrolyte panel  #3 dyspnea and cough in a smoker. Chest x-ray to further evaluate. Pulse oximetry is 97%

## 2011-05-25 LAB — VITAMIN D 25 HYDROXY (VIT D DEFICIENCY, FRACTURES): Vit D, 25-Hydroxy: 27 ng/mL — ABNORMAL LOW (ref 30–89)

## 2011-05-26 NOTE — Progress Notes (Signed)
Quick Note:  Pt informed ______ 

## 2011-05-28 ENCOUNTER — Telehealth: Payer: Self-pay | Admitting: Family Medicine

## 2011-05-28 NOTE — Telephone Encounter (Signed)
Disregard. Pt req to leave vm message instead.

## 2011-05-28 NOTE — Telephone Encounter (Signed)
Pt stated she is return nancy call

## 2011-05-31 ENCOUNTER — Telehealth: Payer: Self-pay | Admitting: Family Medicine

## 2011-05-31 ENCOUNTER — Encounter: Payer: Self-pay | Admitting: *Deleted

## 2011-05-31 DIAGNOSIS — Z0279 Encounter for issue of other medical certificate: Secondary | ICD-10-CM

## 2011-05-31 NOTE — Telephone Encounter (Signed)
Letter written, signed, faxed to employer/[employer] at [phone], confirmation received.  Pt informed ready for pick-up

## 2011-05-31 NOTE — Telephone Encounter (Signed)
Pt called regarding a medication and short term disability. Would not elaborate. Wants you to call her. Thanks!

## 2011-05-31 NOTE — Telephone Encounter (Signed)
Pt called back stating she needed a letter to release her to travel. Information has been given to nancy.

## 2011-05-31 NOTE — Telephone Encounter (Signed)
Pt is calling back requesting Beth [name] to return call

## 2011-06-04 ENCOUNTER — Ambulatory Visit: Payer: 59 | Admitting: Family Medicine

## 2011-06-10 ENCOUNTER — Other Ambulatory Visit: Payer: Self-pay | Admitting: Family Medicine

## 2011-06-23 ENCOUNTER — Encounter: Payer: Self-pay | Admitting: Family Medicine

## 2011-06-23 ENCOUNTER — Ambulatory Visit (INDEPENDENT_AMBULATORY_CARE_PROVIDER_SITE_OTHER): Payer: 59 | Admitting: Licensed Clinical Social Worker

## 2011-06-23 ENCOUNTER — Ambulatory Visit (INDEPENDENT_AMBULATORY_CARE_PROVIDER_SITE_OTHER): Payer: 59 | Admitting: Family Medicine

## 2011-06-23 VITALS — BP 120/82 | Temp 98.6°F | Wt 242.0 lb

## 2011-06-23 DIAGNOSIS — D649 Anemia, unspecified: Secondary | ICD-10-CM

## 2011-06-23 DIAGNOSIS — E559 Vitamin D deficiency, unspecified: Secondary | ICD-10-CM | POA: Insufficient documentation

## 2011-06-23 DIAGNOSIS — F3189 Other bipolar disorder: Secondary | ICD-10-CM

## 2011-06-23 DIAGNOSIS — Z8659 Personal history of other mental and behavioral disorders: Secondary | ICD-10-CM

## 2011-06-23 NOTE — Patient Instructions (Signed)
Go ahead and schedule screening colonoscopy and psychiatrist evaluation.

## 2011-06-23 NOTE — Progress Notes (Signed)
  Subjective:    Patient ID: Beth [name], female    DOB: [birth year], 51 y.o.   MRN: [id number]  HPI  Medical followup. Patient has history of depression. She's been on Effexor 150 mg daily and still had some depressive symptoms. We added Abilify 2 mg at night which is helping slightly. She has been seen by counselor who apparently did mood disorders questionnaire which suggested possible bipolar. She does have occasional periods where she feels high energy and decreased need for sleep. No history of any delusions. Has been recommended that she schedule with psychiatrist which she has not yet done. Is currently out of work on the basis of her anxiety and depression symptoms. She does not feel capable of going back to work at this point because of her anxiety issues. She denies any recent agitation. No suicidal ideation.  Recent low vitamin D. She has not yet gone back on replacement. She has mild normocytic anemia which may be chronic. We referred for screening colonoscopy which she never followed through with. She is again encouraged to schedule this. She denies any stool changes. No appetite changes. She has lost 5 pounds from last visit due to her efforts.  Past Medical History  Diagnosis Date  . Asthma   . GERD (gastroesophageal reflux disease)   . Hypertension   . Heart murmur   . Fibromyalgia   . Arthritis of knee, degenerative    No past surgical history on file.  reports that she quit smoking about 5 months ago. Her smoking use included Cigarettes. She has a 7.5 pack-year smoking history. She does not have any smokeless tobacco history on file. She reports that she does not drink alcohol or use illicit drugs. family history includes Alcohol abuse in her father and Hypertension in her maternal aunt, maternal grandfather, maternal grandmother, maternal uncle, and mother. Allergies  Allergen Reactions  . Mobic     Possible GI bleed      Review of Systems  Constitutional: Negative  for fever, chills, appetite change and unexpected weight change.  Respiratory: Negative for cough and shortness of breath.   Cardiovascular: Negative for chest pain.  Gastrointestinal: Negative for nausea, vomiting, abdominal pain and blood in stool.  Neurological: Negative for headaches.  Psychiatric/Behavioral: Negative for hallucinations, confusion and agitation. The patient is nervous/anxious.        Objective:   Physical Exam  Constitutional: She is oriented to person, place, and time. She appears well-developed and well-nourished.  Cardiovascular: Normal rate and regular rhythm.   Pulmonary/Chest: Effort normal and breath sounds normal. No respiratory distress. She has no wheezes. She has no rales.  Musculoskeletal: She exhibits no edema.  Neurological: She is alert and oriented to person, place, and time.  Psychiatric: She has a normal mood and affect. Her behavior is normal. Judgment and thought content normal.          Assessment & Plan:  #1 depression. Recent question raised of possible bipolar illness. She is encouraged to schedule follow up with psychiatrist for further evaluation. Continue night time use of Abilify. We discussed possible titration but she wishes to wait at this time. #2 normocytic anemia. Question chronic. She is again encouraged to call back to schedule colonoscopy which we previously tried to schedule  #3 low vitamin D. Continue replacement 50,000 international units once per week

## 2011-06-24 ENCOUNTER — Ambulatory Visit: Payer: 59 | Admitting: Family Medicine

## 2011-06-26 ENCOUNTER — Other Ambulatory Visit: Payer: Self-pay | Admitting: Family Medicine

## 2011-06-29 ENCOUNTER — Telehealth: Payer: Self-pay | Admitting: Family Medicine

## 2011-06-29 NOTE — Telephone Encounter (Signed)
Pt called re: short term disability paperwork and said that [employer] is going to fax some additional paperwork over to Dr Caryl Never that need to be completed and signed that are an addition to the disability papers that were previously faxed by Harriett Sine.  Pls call pt asap.

## 2011-07-02 NOTE — Telephone Encounter (Signed)
Pt called today to check on paperwork, Nelva Bush informed her we have not received yet, she will re-fax.

## 2011-07-05 ENCOUNTER — Encounter: Payer: Self-pay | Admitting: Gastroenterology

## 2011-07-07 NOTE — Telephone Encounter (Signed)
Disability form received, pt called to say if it was not filled out by tomorrow, it would effect her pay.

## 2011-07-07 NOTE — Telephone Encounter (Signed)
Pt informed Dr will work on it tonight and I will fax tomorrow am

## 2011-07-07 NOTE — Telephone Encounter (Signed)
Will complete by tomorrow.

## 2011-07-09 ENCOUNTER — Telehealth: Payer: Self-pay | Admitting: Family Medicine

## 2011-07-09 NOTE — Telephone Encounter (Signed)
Pt called req Short Term Disability Form. Pt says that Dr Caryl Never has the fax #. Pt says that he wrote # on notes. Pt has a psychiatric eval re: meds not working and that's why she needs short term disability.

## 2011-07-09 NOTE — Telephone Encounter (Signed)
LMTCB because we do not have the fax number.

## 2011-07-12 ENCOUNTER — Encounter: Payer: Self-pay | Admitting: Family Medicine

## 2011-07-12 ENCOUNTER — Ambulatory Visit (INDEPENDENT_AMBULATORY_CARE_PROVIDER_SITE_OTHER): Payer: 59 | Admitting: Family Medicine

## 2011-07-12 VITALS — BP 122/90 | Temp 98.2°F | Wt 240.0 lb

## 2011-07-12 DIAGNOSIS — F411 Generalized anxiety disorder: Secondary | ICD-10-CM

## 2011-07-12 DIAGNOSIS — Z8659 Personal history of other mental and behavioral disorders: Secondary | ICD-10-CM

## 2011-07-12 DIAGNOSIS — F419 Anxiety disorder, unspecified: Secondary | ICD-10-CM | POA: Insufficient documentation

## 2011-07-12 MED ORDER — ARIPIPRAZOLE 5 MG PO TABS: 5.0000 mg | ORAL_TABLET | Freq: Every day | ORAL | Status: DC

## 2011-07-12 NOTE — Progress Notes (Signed)
  Subjective:    Patient ID: Beth [name], female    DOB: 11/29/1960, 51 y.o.   MRN: [id number]  HPI  Patient is seen to discuss her depression and anxiety issues. We recently referred for counseling and she had screening with concern for possible bipolar disorder. There is positive family history of this. She is currently out of work because of some issues with anxiety. She's never had any delusions or hallucinations but feels agitated at times. We recently added Abilify 2 mg and minimal improvement. She has been on Effexor for quite some time. She has appointment to see psychiatrist end of May.  She has some forms to complete regarding work absence. She does not feel capable going back at this time. She is having difficulties with focus and mostly bothered by extreme anxiety and agitation at times. No suicidal ideation.   Review of Systems  Constitutional: Negative for appetite change and unexpected weight change.  Respiratory: Negative for cough and shortness of breath.   Cardiovascular: Negative for chest pain.  Neurological: Negative for dizziness, syncope and headaches.  Psychiatric/Behavioral: Positive for sleep disturbance, decreased concentration and agitation. Negative for suicidal ideas, confusion and self-injury. The patient is nervous/anxious.        Objective:   Physical Exam  Constitutional: She is oriented to person, place, and time. She appears well-developed and well-nourished.  Cardiovascular: Normal rate and regular rhythm.   Pulmonary/Chest: Breath sounds normal. No respiratory distress. She has no wheezes. She has no rales.  Neurological: She is alert and oriented to person, place, and time. No cranial nerve deficit.  Psychiatric: She has a normal mood and affect. Her behavior is normal. Judgment and thought content normal.       Cognitive function seems somewhat slow.  She is able to do two/three word recall.  Difficulty with serial subtractions. Judgement intact.   Oriented times four.            Assessment & Plan:  Depression and anxiety issues. She has pending referral to psychiatrist. We have suggested titrating Abilify to 5 mg and continue Effexor. Work papers to be completed.

## 2011-07-12 NOTE — Telephone Encounter (Signed)
Pt scheduled for oV today to discuss

## 2011-07-13 ENCOUNTER — Telehealth: Payer: Self-pay | Admitting: Family Medicine

## 2011-07-13 NOTE — Telephone Encounter (Signed)
Pt is aware form is waiting on MD.

## 2011-07-13 NOTE — Telephone Encounter (Signed)
Pt would like to know if her paper work has been faxed back to her employer. Please contact pt

## 2011-07-14 ENCOUNTER — Ambulatory Visit: Payer: 59 | Admitting: Family Medicine

## 2011-07-14 ENCOUNTER — Telehealth: Payer: Self-pay | Admitting: Family Medicine

## 2011-07-14 NOTE — Telephone Encounter (Signed)
Patient called to check on her paperwork and stated that it is imperative that it gets done today and she would like a call when done.

## 2011-07-14 NOTE — Telephone Encounter (Signed)
Pt informed form was faxed, a confirmation was received, copy made for my records, and will have original scanned to her chart.

## 2011-07-15 ENCOUNTER — Ambulatory Visit (INDEPENDENT_AMBULATORY_CARE_PROVIDER_SITE_OTHER): Payer: 59 | Admitting: Licensed Clinical Social Worker

## 2011-07-15 DIAGNOSIS — F3189 Other bipolar disorder: Secondary | ICD-10-CM

## 2011-07-16 ENCOUNTER — Telehealth: Payer: Self-pay | Admitting: Family Medicine

## 2011-07-16 NOTE — Telephone Encounter (Signed)
Pt notes faxed from 4-22, 4-3 and med list faxed, confirmation received

## 2011-07-16 NOTE — Telephone Encounter (Signed)
Per pt [employer] is requesting doctor notes/progress notes from 06-23-2011 to be fax to them today. Pt stated she signed a medical release form

## 2011-08-19 ENCOUNTER — Ambulatory Visit: Payer: 59 | Admitting: Family Medicine

## 2011-08-19 ENCOUNTER — Other Ambulatory Visit: Payer: Self-pay | Admitting: Family Medicine

## 2011-08-24 ENCOUNTER — Telehealth: Payer: Self-pay | Admitting: Family Medicine

## 2011-08-24 ENCOUNTER — Ambulatory Visit: Payer: 59 | Admitting: Family Medicine

## 2011-08-24 MED ORDER — PREDNISONE 20 MG PO TABS: ORAL_TABLET | ORAL | Status: DC

## 2011-08-24 NOTE — Telephone Encounter (Signed)
Needs to be seen.  Would not wait til Friday if she is having any significant dyspnea.

## 2011-08-24 NOTE — Telephone Encounter (Signed)
Prednisone 20 mg tabs- 2 tabs daily for 3 days then one tab daily for 3 days

## 2011-08-24 NOTE — Telephone Encounter (Signed)
Pt called req refill of predniSONE (DELTASONE) 10 MG tablet for asthma. Pls call in to [facility] on [address].  Pt has ov on Friday, so if Dr Caryl Never wants to just give enough med to last until ov, that is fine per pt.

## 2011-08-24 NOTE — Telephone Encounter (Signed)
Pt informed

## 2011-08-24 NOTE — Telephone Encounter (Signed)
I did talk with pt, she just wanted to add "I am wheezing really bad"  And will be in Friday.

## 2011-08-24 NOTE — Telephone Encounter (Signed)
Pt is requesting nancy to return her call today

## 2011-08-24 NOTE — Telephone Encounter (Signed)
I tried to have pt come in sooner than Friday.  She states she gets paid Friday, has another appt in [place] near our office and is concerned about gas $.  She requesting just 2 days of prednisone, stating "I'm th same as before, not wheezing too bad now, but does not want it to get worse either".

## 2011-08-27 ENCOUNTER — Encounter: Payer: Self-pay | Admitting: Family Medicine

## 2011-08-27 ENCOUNTER — Ambulatory Visit (INDEPENDENT_AMBULATORY_CARE_PROVIDER_SITE_OTHER): Payer: 59 | Admitting: Family Medicine

## 2011-08-27 ENCOUNTER — Ambulatory Visit: Payer: 59 | Admitting: Family Medicine

## 2011-08-27 ENCOUNTER — Ambulatory Visit (INDEPENDENT_AMBULATORY_CARE_PROVIDER_SITE_OTHER)
Admission: RE | Admit: 2011-08-27 | Discharge: 2011-08-27 | Disposition: A | Discharge status: Home / Self Care | Payer: 59 | Source: Ambulatory Visit | Attending: Family Medicine | Admitting: Family Medicine

## 2011-08-27 VITALS — BP 150/100 | HR 76 | Temp 98.0°F | Resp 12 | Wt 248.0 lb

## 2011-08-27 DIAGNOSIS — R0989 Other specified symptoms and signs involving the circulatory and respiratory systems: Secondary | ICD-10-CM

## 2011-08-27 DIAGNOSIS — R0609 Other forms of dyspnea: Secondary | ICD-10-CM

## 2011-08-27 DIAGNOSIS — R0600 Dyspnea, unspecified: Secondary | ICD-10-CM

## 2011-08-27 DIAGNOSIS — R9389 Abnormal findings on diagnostic imaging of other specified body structures: Secondary | ICD-10-CM

## 2011-08-27 DIAGNOSIS — R918 Other nonspecific abnormal finding of lung field: Secondary | ICD-10-CM

## 2011-08-27 NOTE — Progress Notes (Signed)
  Subjective:    Patient ID: Beth [name], female    DOB: 1960/09/27, 51 y.o.   MRN: [id number]  HPI  History of asthma. One week history of progressive cough. Nonproductive cough. Patient called in with some wheezing couple days ago started on prednisone 40 mg daily. She denies any fever or chills. No pleuritic pain. No hemoptysis. No recent loss of appetite and she's had about 8 pound weight gain since last visit.  Some dyspnea with activity but not at rest.  She is concerned because sister was recently diagnosed with sarcoidosis. Patient continues to smoke.  Past Medical History  Diagnosis Date  . Asthma   . GERD (gastroesophageal reflux disease)   . Hypertension   . Heart murmur   . Fibromyalgia   . Arthritis of knee, degenerative    No past surgical history on file.  reports that she quit smoking about 7 months ago. Her smoking use included Cigarettes. She has a 7.5 pack-year smoking history. She does not have any smokeless tobacco history on file. She reports that she does not drink alcohol or use illicit drugs. family history includes Alcohol abuse in her father and Hypertension in her maternal aunt, maternal grandfather, maternal grandmother, maternal uncle, and mother. Allergies  Allergen Reactions  . Meloxicam     Possible GI bleed      Review of Systems  Constitutional: Positive for fatigue.  HENT: Negative for sore throat and voice change.   Respiratory: Positive for cough, shortness of breath and wheezing.   Cardiovascular: Negative for chest pain and leg swelling.       Objective:   Physical Exam  Constitutional: She appears well-developed and well-nourished.  HENT:  Right Ear: External ear normal.  Left Ear: External ear normal.  Mouth/Throat: Oropharynx is clear and moist.  Cardiovascular: Normal rate and regular rhythm.   Pulmonary/Chest:       Patient has decreased breath sounds right lung compared to left. No active wheezing. No rales. No retractions.  Normal respiratory rate. Pulse oximetry 94% room air  Musculoskeletal: She exhibits no edema.  Neurological: She is alert.          Assessment & Plan:  Cough and dyspnea. No respiratory distress.  She has asymmetric findings on lung exam. Start with chest x-ray. Need to rule out pneumothorax, obstructive lesion, versus other.  CXR shows white out right lung-?postobstructive atelectasis vs large effusion.  Pt notified. CT chest with contrast and she know to to ER over weekend for any respiratory distress.

## 2011-08-29 ENCOUNTER — Encounter: Payer: Self-pay | Admitting: Family Medicine

## 2011-08-30 ENCOUNTER — Telehealth: Payer: Self-pay | Admitting: Family Medicine

## 2011-08-30 ENCOUNTER — Ambulatory Visit (INDEPENDENT_AMBULATORY_CARE_PROVIDER_SITE_OTHER)
Admission: RE | Admit: 2011-08-30 | Discharge: 2011-08-30 | Disposition: A | Discharge status: Home / Self Care | Payer: 59 | Source: Ambulatory Visit | Attending: Family Medicine | Admitting: Family Medicine

## 2011-08-30 ENCOUNTER — Telehealth: Payer: Self-pay | Admitting: *Deleted

## 2011-08-30 DIAGNOSIS — R9389 Abnormal findings on diagnostic imaging of other specified body structures: Secondary | ICD-10-CM

## 2011-08-30 DIAGNOSIS — R918 Other nonspecific abnormal finding of lung field: Secondary | ICD-10-CM

## 2011-08-30 DIAGNOSIS — J90 Pleural effusion, not elsewhere classified: Secondary | ICD-10-CM

## 2011-08-30 DIAGNOSIS — R0609 Other forms of dyspnea: Secondary | ICD-10-CM

## 2011-08-30 DIAGNOSIS — R0600 Dyspnea, unspecified: Secondary | ICD-10-CM

## 2011-08-30 HISTORY — DX: Pleural effusion, not elsewhere classified: J90

## 2011-08-30 MED ORDER — HYDROCODONE-ACETAMINOPHEN 5-325 MG PO TABS: 1.0000 | ORAL_TABLET | Freq: Four times a day (QID) | ORAL | Status: AC | PRN

## 2011-08-30 MED ORDER — IOHEXOL 300 MG/ML  SOLN
80.0000 mL | Freq: Once | INTRAMUSCULAR | Status: AC | PRN
  Administered 2011-08-30: 80 mL via INTRAVENOUS

## 2011-08-30 NOTE — Telephone Encounter (Signed)
Received VM from pt reporting severe headaches over the weekend, she is not able to sleep.  Requesting pain med for her headache so she can get some sleep tonight.  Pt is planning on noon OV tomorrow.

## 2011-08-30 NOTE — Telephone Encounter (Signed)
Vicodin 5/325 mg 1-2 po q 4-6 hours prn , disp #30 with no refill.

## 2011-08-30 NOTE — Telephone Encounter (Signed)
Opened in error

## 2011-08-30 NOTE — Telephone Encounter (Signed)
Pt informed Rx called in. 

## 2011-08-31 ENCOUNTER — Ambulatory Visit (INDEPENDENT_AMBULATORY_CARE_PROVIDER_SITE_OTHER): Payer: 59 | Admitting: Family Medicine

## 2011-08-31 ENCOUNTER — Encounter: Payer: Self-pay | Admitting: Family Medicine

## 2011-08-31 VITALS — BP 130/92 | Temp 99.3°F | Wt 242.0 lb

## 2011-08-31 DIAGNOSIS — I10 Essential (primary) hypertension: Secondary | ICD-10-CM

## 2011-08-31 DIAGNOSIS — R918 Other nonspecific abnormal finding of lung field: Secondary | ICD-10-CM

## 2011-08-31 DIAGNOSIS — R222 Localized swelling, mass and lump, trunk: Secondary | ICD-10-CM

## 2011-08-31 LAB — CBC WITH DIFFERENTIAL/PLATELET
Basophils Absolute: 0 10*3/uL (ref 0.0–0.1)
Eosinophils Absolute: 0.4 10*3/uL (ref 0.0–0.7)
Eosinophils Relative: 4.8 % (ref 0.0–5.0)
HCT: 26.8 % — ABNORMAL LOW (ref 36.0–46.0)
Lymphs Abs: 2.6 10*3/uL (ref 0.7–4.0)
MCV: 85.9 fl (ref 78.0–100.0)
Monocytes Absolute: 0.9 10*3/uL (ref 0.1–1.0)
Neutrophils Relative %: 53.1 % (ref 43.0–77.0)
Platelets: 439 10*3/uL — ABNORMAL HIGH (ref 150.0–400.0)
RDW: 19 % — ABNORMAL HIGH (ref 11.5–14.6)
WBC: 8.4 10*3/uL (ref 4.5–10.5)

## 2011-08-31 LAB — BASIC METABOLIC PANEL
BUN: 11 mg/dL (ref 6–23)
Chloride: 105 mEq/L (ref 96–112)
Creatinine, Ser: 0.6 mg/dL (ref 0.4–1.2)
Glucose, Bld: 70 mg/dL (ref 70–99)
Potassium: 3.8 mEq/L (ref 3.5–5.1)

## 2011-08-31 LAB — HEPATIC FUNCTION PANEL
Bilirubin, Direct: 0 mg/dL (ref 0.0–0.3)
Total Bilirubin: 0.2 mg/dL — ABNORMAL LOW (ref 0.3–1.2)

## 2011-08-31 NOTE — Progress Notes (Addendum)
  Subjective:    Patient ID: Beth [name], female    DOB: [birth year], 51 y.o.   MRN: [id number]  HPI  Patient seen for followup abnormal chest x-ray and CT scan. She presented here last Friday with cough and dyspnea. On exam we noted decreased breath sounds right lung. Patient sent for chest x-ray which showed white out right lung. CT chest with contrast reveals large right-sided central obstructing mass. Also pulmonary nodule left lower lobe. Multiple enlarged mediastinal lymph nodes. Large loculated right pleural effusion. Patient is seen today in followup to review results. Symptomatically stable. She has some dyspnea with activity but not at rest. Denies fever. No hemoptysis. No recent weight changes.  She's had some right frontal headache for the past few days. No visual changes. No nausea or vomiting. No focal neurologic symptoms.  Past Medical History  Diagnosis Date  . Asthma   . GERD (gastroesophageal reflux disease)   . Hypertension   . Heart murmur   . Fibromyalgia   . Arthritis of knee, degenerative    No past surgical history on file.  reports that she quit smoking about 7 months ago. Her smoking use included Cigarettes. She has a 7.5 pack-year smoking history. She does not have any smokeless tobacco history on file. She reports that she does not drink alcohol or use illicit drugs. family history includes Alcohol abuse in her father and Hypertension in her maternal aunt, maternal grandfather, maternal grandmother, maternal uncle, and mother. Allergies  Allergen Reactions  . Meloxicam     Possible GI bleed      Review of Systems  Constitutional: Positive for fatigue. Negative for fever and chills.  Respiratory: Positive for cough and shortness of breath.   Cardiovascular: Negative for palpitations and leg swelling.  Gastrointestinal: Negative for nausea, vomiting and abdominal pain.  Neurological: Positive for headaches.  Hematological: Negative for adenopathy.       Objective:   Physical Exam  Constitutional: She is oriented to person, place, and time. She appears well-developed and well-nourished.  Neck: Neck supple.  Cardiovascular: Normal rate and regular rhythm.   Pulmonary/Chest: Effort normal.       Diminished breath sounds throughout right lung. Left lung is clear  Musculoskeletal: She exhibits no edema.  Lymphadenopathy:    She has no cervical adenopathy.  Neurological: She is alert and oriented to person, place, and time.          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 She has large loculated pulmonary effusion. Pulmonary referral set up for tomorrow. Go ahead and check CT head and abdomen and pelvis. Obtain screening labs.  Patient will need oncology referral but we'll need tissue diagnosis first.  She knows to follow up immediately for any fever or increased dyspnea. #2 hypertension. Stable.  We have recommended that patient be out of work for the following dates:  08-31-11 through 12-01-11

## 2011-09-01 ENCOUNTER — Telehealth: Payer: Self-pay | Admitting: Family Medicine

## 2011-09-01 ENCOUNTER — Encounter: Payer: Self-pay | Admitting: Pulmonary Disease

## 2011-09-01 ENCOUNTER — Ambulatory Visit (INDEPENDENT_AMBULATORY_CARE_PROVIDER_SITE_OTHER): Payer: 59 | Admitting: Pulmonary Disease

## 2011-09-01 ENCOUNTER — Encounter: Payer: Self-pay | Admitting: *Deleted

## 2011-09-01 VITALS — BP 140/100 | HR 101 | Temp 98.6°F | Ht 66.0 in | Wt 240.8 lb

## 2011-09-01 DIAGNOSIS — J90 Pleural effusion, not elsewhere classified: Secondary | ICD-10-CM

## 2011-09-01 NOTE — Patient Instructions (Addendum)
US guided thoracentesis (sent for cell count, biochemistry, cultures and cytology) Follow up next week

## 2011-09-01 NOTE — Progress Notes (Deleted)
  Subjective:    Patient ID: Beth [name], female    DOB: 04/16/1960, 51 y.o.   MRN: [id number]  HPI    Review of Systems  Constitutional: Positive for unexpected weight change. Negative for fever.  HENT: Positive for congestion, rhinorrhea, trouble swallowing, postnasal drip and sinus pressure. Negative for ear pain, nosebleeds, sore throat, sneezing and dental problem.   Eyes: Negative for redness and itching.  Respiratory: Positive for cough, choking, chest tightness, shortness of breath and wheezing.   Cardiovascular: Positive for palpitations. Negative for leg swelling.  Gastrointestinal: Positive for nausea. Negative for vomiting.  Genitourinary: Negative for dysuria.  Musculoskeletal: Negative for joint swelling.  Skin: Negative for rash.  Neurological: Positive for headaches.  Hematological: Bruises/bleeds easily.  Psychiatric/Behavioral: Negative for dysphoric mood. The patient is nervous/anxious.        Objective:   Physical Exam        Assessment & Plan:   

## 2011-09-01 NOTE — Progress Notes (Signed)
Name: Beth [name] MRN: [id number] DOB: [birth year]  Referring Physician:  Caryl Never Reason for Consultation:  Lung mass / pleural effusion   INTERVENTIONAL PULMONOLOGY CONSULTATION NOTE   History of Present Illness: 51 year old smoker with progressive dyspnea since several month ago associated with dry cough.  No fever, chills, hemoptysis.  Does report night sweats.  Never had similar symptoms in the past.  Chest imaging demonstrated large right hilar mass and pleural effusion.  Patient referred for further evaluation and treatment.  Past Medical History  Diagnosis Date  . Asthma   . GERD (gastroesophageal reflux disease)   . Hypertension   . Heart murmur   . Fibromyalgia   . Arthritis of knee, degenerative    No past surgical history on file. Prior to Admission medications   Medication Sig Start Date End Date Taking? Authorizing Provider  ARIPiprazole (ABILIFY) 5 MG tablet Take 5 mg by mouth daily. 07/12/11 08/27/15 Yes [name], MD  cyclobenzaprine (FLEXERIL) 10 MG tablet Take 10 mg by mouth as needed.  05/17/11  Yes Historical Provider, MD  esomeprazole (NEXIUM) 40 MG capsule Take 1 capsule (40 mg total) by mouth daily before breakfast. 05/17/11  Yes [name], MD  fish oil-omega-3 fatty acids 1000 MG capsule Take 2 g by mouth daily.   Yes Historical Provider, MD  hydrochlorothiazide (HYDRODIURIL) 25 MG tablet TAKE ONE TABLET EVERY DAY 06/26/11  Yes [name], MD  HYDROcodone-acetaminophen (NORCO) 5-325 MG per tablet Take 1 tablet by mouth every 6 (six) hours as needed for pain. 08/30/11 09/09/11 Yes [name], MD  ibuprofen (ADVIL,MOTRIN) 600 MG tablet TAKE 1 TABLET BY MOUTH EVERY 6 HOURS AS NEEDED FOR PAIN 04/26/11  Yes [name], MD  lamoTRIgine (LAMICTAL) 25 MG tablet  08/11/11  Yes Historical Provider, MD  Multiple Vitamins-Minerals (MULTIVITAMIN WITH MINERALS) tablet Take 1 tablet by mouth daily.   Yes Historical Provider, MD  Venlafaxine HCl  150 MG TB24 Take 1 tablet (150 mg total) by mouth daily. 05/17/11  Yes [name], MD  VENTOLIN HFA 108 (90 BASE) MCG/ACT inhaler Inhale 2 puffs into the lungs as needed.  03/23/11  Yes Historical Provider, MD  Vitamin D, Ergocalciferol, (DRISDOL) 50000 UNITS CAPS Take 1 capsule (50,000 Units total) by mouth every 7 (seven) days. 04/13/11  Yes [name], MD  ARIPiprazole (ABILIFY) 2 MG tablet Take 2 mg by mouth daily. 05/24/11 08/27/15  [name], MD  montelukast (SINGULAIR) 10 MG tablet TAKE 1 TABLET EVERY EVENING 06/10/11   Gordy Savers, MD  predniSONE (DELTASONE) 20 MG tablet 2 tabs by mouth daily for 3 days, then 1 tab by mouth for 3 days 08/24/11   [name], MD   Allergies Allergies  Allergen Reactions  . Meloxicam     Possible GI bleed    Family History Lung disease: Malignancies:  Social History Smoking: Occupational exposure:  Review Of Systems:  Constitutional: Positive for unexpected weight change. Negative for fever.  HENT: Positive for congestion, rhinorrhea, trouble swallowing, postnasal drip and sinus pressure. Negative for ear pain, nosebleeds, sore throat, sneezing and dental problem.  Eyes: Negative for redness and itching.  Respiratory: Positive for cough, choking, chest tightness, shortness of breath and wheezing.  Cardiovascular: Positive for palpitations. Negative for leg swelling.  Gastrointestinal: Positive for nausea. Negative for vomiting.  Genitourinary: Negative for dysuria.  Musculoskeletal: Negative for joint swelling.  Skin: Negative for rash.  Neurological: Positive for headaches.  Hematological: Bruises/bleeds easily.  Psychiatric/Behavioral: Negative for dysphoric mood. The patient is nervous/anxious.   Vital Signs: Filed Vitals:   09/01/11 1544  BP: 140/100  Pulse: 101  Temp: 98.6 F (37 C)  TempSrc: Oral  Height: 5\' 6"  (1.676 m)  Weight: 109.226 kg (240 lb 12.8 oz)  SpO2: 94%   Physical  Examination: General:  No acute distress, obese Neuro:  Alert, oriented, nonfocal HEENT:  Pink conjunctivae, moist mucus membranes Neck:  No lymphadenopathy Cardiovascular:  RRR, no murmurs Lungs:  Diminished air entry / dullness to percussion R lung field Abdomen:  Obese, soft, nontender, nondistended Musculoskeletal:  No clubbing, cyanosis or edema Skin: No rash   Labs:  Lab 08/31/11 1308  HGB 8.5 Repeated and verified X2.*  HCT 26.8*  WBC 8.4  PLT 439.0*  NA 142  K 3.8  CL 105  CO2 27  GLUCOSE 70  BUN 11  CREATININE 0.6  CALCIUM 8.6  MG --  PHOS --  INR --  APTT --    Chest CT(images reviewed):  Large right-sided hilar mass with evidence of airway cutoff. Large right-sided loculated pleural effusion with right lung collapse. Bilateral mediastinal lymphadenopathy.  Extensive multifocal sclerotic bone lesions.  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  Unable to perform in the office today.  Will schedule US guided at [facility] for tomorrow AM.  Should send cell count, biochemistry, cultures and cytology (large volume).  If diagnostic, may not need further diagnostic procedures. -->  If thoracentesis is not diagnostic will schedule bronchoscopic biopsy. -->  May need tunneled pleural catheter in a long run, will reassess the need. -->  Will see back next week.  Orlean Bradford, M.D., F.C.C.P. Interventional Pulmonology [facility] Cell: [phone] Pager: [phone]  09/01/2011, 5:30 PM

## 2011-09-01 NOTE — Telephone Encounter (Signed)
Patient called stating that she was following up on whether or not her ov notes were sent to [facility]. Please advise.

## 2011-09-01 NOTE — Telephone Encounter (Signed)
We need to send note to keep her out of work for the next 3 months to [employer] (we may have their fax from prior notes we have sent).

## 2011-09-01 NOTE — Progress Notes (Deleted)
  Subjective:    Patient ID: Beth [name], female    DOB: 07/04/1960, 51 y.o.   MRN: [id number]  HPI    Review of Systems  Constitutional: Positive for unexpected weight change. Negative for fever.  HENT: Positive for congestion, rhinorrhea, trouble swallowing, postnasal drip and sinus pressure. Negative for ear pain, nosebleeds, sore throat, sneezing and dental problem.   Eyes: Negative for redness and itching.  Respiratory: Positive for cough, choking, chest tightness, shortness of breath and wheezing.   Cardiovascular: Positive for palpitations. Negative for leg swelling.  Gastrointestinal: Positive for nausea. Negative for vomiting.  Genitourinary: Negative for dysuria.  Musculoskeletal: Negative for joint swelling.  Skin: Negative for rash.  Neurological: Positive for headaches.  Hematological: Bruises/bleeds easily.  Psychiatric/Behavioral: Negative for dysphoric mood. The patient is nervous/anxious.        Objective:   Physical Exam        Assessment & Plan:

## 2011-09-01 NOTE — Progress Notes (Signed)
Quick Note:  Labs will be mailed to pt home ______

## 2011-09-01 NOTE — Telephone Encounter (Signed)
Pt gave verbal permission for her OV note from 6/11 be faxed to [facility] at [phone].

## 2011-09-02 ENCOUNTER — Inpatient Hospital Stay: Admission: RE | Admit: 2011-09-02 | Payer: 59 | Source: Ambulatory Visit

## 2011-09-02 ENCOUNTER — Encounter: Payer: Self-pay | Admitting: Physician Assistant

## 2011-09-02 ENCOUNTER — Telehealth: Payer: Self-pay | Admitting: Pulmonary Disease

## 2011-09-02 ENCOUNTER — Ambulatory Visit (HOSPITAL_COMMUNITY)
Admission: RE | Admit: 2011-09-02 | Discharge: 2011-09-02 | Disposition: A | Discharge status: Home / Self Care | Payer: 59 | Source: Ambulatory Visit | Attending: Physician Assistant | Admitting: Physician Assistant

## 2011-09-02 ENCOUNTER — Other Ambulatory Visit: Payer: 59

## 2011-09-02 ENCOUNTER — Ambulatory Visit (HOSPITAL_COMMUNITY)
Admission: RE | Admit: 2011-09-02 | Discharge: 2011-09-02 | Disposition: A | Discharge status: Home / Self Care | Payer: 59 | Source: Ambulatory Visit | Attending: Pulmonary Disease | Admitting: Pulmonary Disease

## 2011-09-02 VITALS — BP 150/103

## 2011-09-02 DIAGNOSIS — J90 Pleural effusion, not elsewhere classified: Secondary | ICD-10-CM | POA: Insufficient documentation

## 2011-09-02 LAB — BODY FLUID CELL COUNT WITH DIFFERENTIAL
Lymphs, Fluid: 86 %
Monocyte-Macrophage-Serous Fluid: 5 % — ABNORMAL LOW (ref 50–90)
Total Nucleated Cell Count, Fluid: 863 cu mm (ref 0–1000)

## 2011-09-02 LAB — PROTEIN, BODY FLUID: Total protein, fluid: 4.2 g/dL

## 2011-09-02 LAB — LACTATE DEHYDROGENASE, PLEURAL OR PERITONEAL FLUID: LD, Fluid: 158 U/L — ABNORMAL HIGH (ref 3–23)

## 2011-09-02 NOTE — Telephone Encounter (Signed)
Scheduled today@1 :00pm pt is aware Beth [name]

## 2011-09-02 NOTE — Procedures (Signed)
Procedure : right thoracentesis Specimen : 800 ml bloody serous fluid Complications : none immediate. Patient did experience initial chest pain during lung expansion - resolved and patient without distress  02 saturations remained above 95 % on RA. Fluid sent to the lab for studies as requested

## 2011-09-03 ENCOUNTER — Telehealth: Payer: Self-pay | Admitting: *Deleted

## 2011-09-03 DIAGNOSIS — R918 Other nonspecific abnormal finding of lung field: Secondary | ICD-10-CM

## 2011-09-03 LAB — PH, BODY FLUID: pH, Fluid: 7.5

## 2011-09-03 NOTE — Telephone Encounter (Signed)
Left message on machine for patient

## 2011-09-03 NOTE — Telephone Encounter (Signed)
Patient was calling for lab results.  She also would like to know if it is too early for a referral to a cancer center or if she should wait for results?

## 2011-09-03 NOTE — Telephone Encounter (Signed)
Pulmonologist should call with lab results. I will go ahead with setting up oncology referral.  They will need tissue diagnosis before proceeding with further treatment recommendations

## 2011-09-05 LAB — BODY FLUID CULTURE: Special Requests: NORMAL

## 2011-09-06 ENCOUNTER — Telehealth: Payer: Self-pay | Admitting: Pulmonary Disease

## 2011-09-06 NOTE — Telephone Encounter (Signed)
Called spoke patient who stated that she would like to have another thoracentesis done tomorrow before her 6.19.13 appt with Dr Herma Carson.  Pt does not want to wait until her ov to discuss this.  Pt also has a CT head and body scheduled for tomorrow (ordered by Dr Caryl Never) and she does not want to have this done - does not want anymore radiation exposure at this time; requesting Dr Burna Forts opinion on this.  Will forward to Dr Herma Carson and page him to make him aware of this message.

## 2011-09-07 ENCOUNTER — Other Ambulatory Visit: Payer: 59

## 2011-09-07 ENCOUNTER — Telehealth: Payer: Self-pay | Admitting: Family Medicine

## 2011-09-07 ENCOUNTER — Other Ambulatory Visit: Payer: Self-pay | Admitting: Pulmonary Disease

## 2011-09-07 DIAGNOSIS — J90 Pleural effusion, not elsewhere classified: Secondary | ICD-10-CM

## 2011-09-07 NOTE — Telephone Encounter (Signed)
Pt mother requesting to be contacted about fluid on her chest. Mother thinks she should be admit to the hospital Please contact

## 2011-09-08 ENCOUNTER — Ambulatory Visit (INDEPENDENT_AMBULATORY_CARE_PROVIDER_SITE_OTHER)
Admission: RE | Admit: 2011-09-08 | Discharge: 2011-09-08 | Disposition: A | Discharge status: Home / Self Care | Payer: 59 | Source: Ambulatory Visit | Attending: Pulmonary Disease | Admitting: Pulmonary Disease

## 2011-09-08 ENCOUNTER — Ambulatory Visit (INDEPENDENT_AMBULATORY_CARE_PROVIDER_SITE_OTHER): Payer: 59 | Admitting: Pulmonary Disease

## 2011-09-08 ENCOUNTER — Encounter: Payer: Self-pay | Admitting: Pulmonary Disease

## 2011-09-08 VITALS — BP 106/80 | HR 119 | Temp 98.6°F | Ht 65.0 in | Wt 233.0 lb

## 2011-09-08 DIAGNOSIS — J90 Pleural effusion, not elsewhere classified: Secondary | ICD-10-CM

## 2011-09-08 DIAGNOSIS — R222 Localized swelling, mass and lump, trunk: Secondary | ICD-10-CM

## 2011-09-08 DIAGNOSIS — R918 Other nonspecific abnormal finding of lung field: Secondary | ICD-10-CM

## 2011-09-08 NOTE — Telephone Encounter (Signed)
Not sure her mother is on HIPPA.  If patient is having significant dyspnea she would need to be seen by pulmonology.  If she has severe dyspnea she should go to hospital this time for further evaluation

## 2011-09-08 NOTE — Telephone Encounter (Signed)
Pt mother informed, pt has appt with pulmonary tomorrow.

## 2011-09-08 NOTE — Patient Instructions (Addendum)
US guided thoracentesis ASAP (send large volume for cytology) Schedule EBUS in OR next week May need PleurX placement Will call for follow up appointment

## 2011-09-08 NOTE — Progress Notes (Signed)
Name: Beth [name] MRN: [id number] DOB: 02/21/1961  Referring Physician:  Burchette Reason for Consultation:  Lung mass / pleural effusion   INTERVENTIONAL PULMONOLOGY CONSULTATION NOTE   History of Present Illness: 51-year-old smoker with progressive dyspnea since several month ago associated with dry cough.  No fever, chills, hemoptysis.  Does report night sweats.  Never had similar symptoms in the past.  Chest imaging demonstrated large right hilar mass and pleural effusion.  Patient referred for further evaluation and treatment.  Interval history:  Called the office yesterday complaining more dyspnea, suspects effusion is back.  Vital Signs: Filed Vitals:   09/08/11 1337  BP: 106/80  Pulse: 119  Temp: 98.6 F (37 C)  TempSrc: Oral  Height: 5' 5" (1.651 m)  Weight: 233 lb (105.688 kg)  SpO2: 93%   Physical Examination: General:  Comfortable Neuro:  Alert, oriented, nonfocal HEENT:  Pink conjunctivae, moist mucus membranes Neck:  No lymphadenopathy Cardiovascular:  RRR, no murmurs Lungs:  Diminished air entry on the right Abdomen:  Obese, soft, nontender, nondistended Musculoskeletal:  No clubbing, cyanosis or edema Skin: No rash   Labs: No results found for this basename: HGB:3,HCT:3,WBC:3,PLT:3,NA:5,K:2,CL:5,CO2:5,GLUCOSE:5,BUN:5,CREATININE:5,CALCIUM:5,MG:5,PHOS:5,INR:5,APTT:5 in the last 168 hours  CXR  6/19 >>> Large right pleural effusion  Chest CT(images reviewed):  Large right-sided hilar mass with evidence of airway cutoff. Large right-sided loculated pleural effusion with right lung collapse. Bilateral mediastinal lymphadenopathy.  Extensive multifocal sclerotic bone lesions.  PFT:  NA  TTE:  NA  Pleural fluid cytology: 6/13 >>> Atypical cohesive epithelioid cells  ASSESSMENT AND PLAN  Large right hilar mass suspicious for malignancy associated with mediastinal lymphadenopathy and sclerotic bone lesions suspicious for metastatic disease Large right  pleural effusion, likely malignant Mild intermittent asthma, stable  -->  Will schedule US guided thoracentesis to alleviate symptoms -->  Will resend large volume sample for cytology -->  May eventually need PleurX placement -->  Schedule Bronchoscopy / EBUS in OR next week in case pleural cytology is again nondiagnotic.  K. Dameisha Tschida, M.D., F.C.C.P. Interventional Pulmonology [facility] Cell: [phone] Pager: [phone]  09/08/2011, 2:30 PM   

## 2011-09-09 ENCOUNTER — Other Ambulatory Visit (HOSPITAL_COMMUNITY): Payer: 59

## 2011-09-09 ENCOUNTER — Ambulatory Visit (HOSPITAL_COMMUNITY)
Admission: RE | Admit: 2011-09-09 | Discharge: 2011-09-09 | Disposition: A | Discharge status: Home / Self Care | Payer: 59 | Source: Ambulatory Visit | Attending: Radiology | Admitting: Radiology

## 2011-09-09 ENCOUNTER — Ambulatory Visit (HOSPITAL_COMMUNITY)
Admission: RE | Admit: 2011-09-09 | Discharge: 2011-09-09 | Disposition: A | Discharge status: Home / Self Care | Payer: 59 | Source: Ambulatory Visit | Attending: Pulmonary Disease | Admitting: Pulmonary Disease

## 2011-09-09 DIAGNOSIS — R222 Localized swelling, mass and lump, trunk: Secondary | ICD-10-CM | POA: Insufficient documentation

## 2011-09-09 DIAGNOSIS — J90 Pleural effusion, not elsewhere classified: Secondary | ICD-10-CM | POA: Insufficient documentation

## 2011-09-09 DIAGNOSIS — R918 Other nonspecific abnormal finding of lung field: Secondary | ICD-10-CM

## 2011-09-09 NOTE — Telephone Encounter (Signed)
Pt was seen 09-08-11 by Dr. Herma Carson so will close this encounter.

## 2011-09-09 NOTE — Procedures (Signed)
US guided right thoracentesis. Yielded only 25mL of blood tinged fluid. Pt tolerated procedure well. No immediate complications.  Specimen was sent for labs. CXR ordered.  Brayton El PA-C 09/09/2011 2:23 PM

## 2011-09-10 ENCOUNTER — Encounter (HOSPITAL_COMMUNITY): Payer: Self-pay | Admitting: *Deleted

## 2011-09-13 ENCOUNTER — Encounter (HOSPITAL_COMMUNITY): Payer: Self-pay | Admitting: Certified Registered"

## 2011-09-13 ENCOUNTER — Ambulatory Visit (HOSPITAL_COMMUNITY)
Admission: RE | Admit: 2011-09-13 | Discharge: 2011-09-13 | Disposition: A | Discharge status: Home / Self Care | Payer: 59 | Source: Ambulatory Visit | Attending: Pulmonary Disease | Admitting: Pulmonary Disease

## 2011-09-13 ENCOUNTER — Ambulatory Visit (HOSPITAL_COMMUNITY): Payer: 59 | Admitting: Certified Registered"

## 2011-09-13 ENCOUNTER — Ambulatory Visit (HOSPITAL_COMMUNITY): Payer: 59

## 2011-09-13 ENCOUNTER — Encounter (HOSPITAL_COMMUNITY)
Admission: RE | Disposition: A | Discharge status: Home / Self Care | Payer: Self-pay | Source: Ambulatory Visit | Attending: Pulmonary Disease

## 2011-09-13 ENCOUNTER — Encounter (HOSPITAL_COMMUNITY): Payer: Self-pay | Admitting: *Deleted

## 2011-09-13 DIAGNOSIS — R222 Localized swelling, mass and lump, trunk: Secondary | ICD-10-CM | POA: Insufficient documentation

## 2011-09-13 DIAGNOSIS — J90 Pleural effusion, not elsewhere classified: Secondary | ICD-10-CM

## 2011-09-13 DIAGNOSIS — R599 Enlarged lymph nodes, unspecified: Secondary | ICD-10-CM | POA: Insufficient documentation

## 2011-09-13 HISTORY — DX: Endocarditis, valve unspecified: I38

## 2011-09-13 HISTORY — DX: Shortness of breath: R06.02

## 2011-09-13 HISTORY — DX: Other complications of anesthesia, initial encounter: T88.59XA

## 2011-09-13 HISTORY — DX: Other nonspecific abnormal finding of lung field: R91.8

## 2011-09-13 HISTORY — DX: Sleep apnea, unspecified: G47.30

## 2011-09-13 HISTORY — DX: Adverse effect of unspecified anesthetic, initial encounter: T41.45XA

## 2011-09-13 LAB — APTT: aPTT: 44 seconds — ABNORMAL HIGH (ref 24–37)

## 2011-09-13 LAB — CBC
HCT: 25.2 % — ABNORMAL LOW (ref 36.0–46.0)
MCV: 84.3 fL (ref 78.0–100.0)
RBC: 2.99 MIL/uL — ABNORMAL LOW (ref 3.87–5.11)
RDW: 17.3 % — ABNORMAL HIGH (ref 11.5–15.5)
WBC: 9.8 10*3/uL (ref 4.0–10.5)

## 2011-09-13 LAB — COMPREHENSIVE METABOLIC PANEL
Albumin: 2.9 g/dL — ABNORMAL LOW (ref 3.5–5.2)
BUN: 15 mg/dL (ref 6–23)
Calcium: 9.1 mg/dL (ref 8.4–10.5)
Creatinine, Ser: 0.74 mg/dL (ref 0.50–1.10)
GFR calc Af Amer: >90 mL/min (ref >90)
Glucose, Bld: 80 mg/dL (ref 70–99)
Total Protein: 7.9 g/dL (ref 6.0–8.3)

## 2011-09-13 LAB — PROTIME-INR: INR: 1.09 (ref 0.00–1.49)

## 2011-09-13 SURGERY — BRONCHOSCOPY, WITH EBUS
Anesthesia: General | Laterality: Bilateral | Wound class: Clean Contaminated

## 2011-09-13 MED ORDER — FENTANYL CITRATE 0.05 MG/ML IJ SOLN
INTRAMUSCULAR | Status: DC | PRN
  Administered 2011-09-13: 50 ug via INTRAVENOUS

## 2011-09-13 MED ORDER — LACTATED RINGERS IV SOLN
INTRAVENOUS | Status: DC | PRN
  Administered 2011-09-13: 11:00:00 via INTRAVENOUS

## 2011-09-13 MED ORDER — LIDOCAINE HCL 4 % MT SOLN
OROMUCOSAL | Status: DC | PRN
  Administered 2011-09-13: 4 mL via TOPICAL

## 2011-09-13 MED ORDER — PROPOFOL 10 MG/ML IV EMUL
INTRAVENOUS | Status: DC | PRN
  Administered 2011-09-13: 50 mg via INTRAVENOUS
  Administered 2011-09-13: 180 mg via INTRAVENOUS

## 2011-09-13 MED ORDER — NEOSTIGMINE METHYLSULFATE 1 MG/ML IJ SOLN
INTRAMUSCULAR | Status: DC | PRN
  Administered 2011-09-13: 3 mg via INTRAVENOUS

## 2011-09-13 MED ORDER — ONDANSETRON HCL 4 MG/2ML IJ SOLN: 4.0000 mg | Freq: Once | INTRAMUSCULAR | Status: DC | PRN

## 2011-09-13 MED ORDER — HYDROMORPHONE HCL PF 1 MG/ML IJ SOLN: 0.2500 mg | INTRAMUSCULAR | Status: DC | PRN

## 2011-09-13 MED ORDER — GLYCOPYRROLATE 0.2 MG/ML IJ SOLN
INTRAMUSCULAR | Status: DC | PRN
  Administered 2011-09-13: 0.4 mg via INTRAVENOUS

## 2011-09-13 MED ORDER — ROCURONIUM BROMIDE 100 MG/10ML IV SOLN
INTRAVENOUS | Status: DC | PRN
  Administered 2011-09-13: 20 mg via INTRAVENOUS

## 2011-09-13 MED ORDER — ONDANSETRON HCL 4 MG/2ML IJ SOLN
INTRAMUSCULAR | Status: DC | PRN
  Administered 2011-09-13 (×2): 4 mg via INTRAVENOUS

## 2011-09-13 MED ORDER — EPINEPHRINE HCL 1 MG/ML IJ SOLN: INTRAMUSCULAR | Status: DC | PRN

## 2011-09-13 MED ORDER — LIDOCAINE HCL (CARDIAC) 20 MG/ML IV SOLN
INTRAVENOUS | Status: DC | PRN
  Administered 2011-09-13: 40 mg via INTRAVENOUS

## 2011-09-13 MED ORDER — SUCCINYLCHOLINE CHLORIDE 20 MG/ML IJ SOLN
INTRAMUSCULAR | Status: DC | PRN
  Administered 2011-09-13: 100 mg via INTRAVENOUS

## 2011-09-13 MED ORDER — 0.9 % SODIUM CHLORIDE (POUR BTL) OPTIME
TOPICAL | Status: DC | PRN
  Administered 2011-09-13: 1000 mL

## 2011-09-13 SURGICAL SUPPLY — 23 items
BRUSH CYTOL CELLEBRITY 1.5X140 (MISCELLANEOUS) IMPLANT
CANISTER SUCTION 2500CC (MISCELLANEOUS) ×2 IMPLANT
CLOTH BEACON ORANGE TIMEOUT ST (SAFETY) ×2 IMPLANT
CONT SPEC 4OZ CLIKSEAL STRL BL (MISCELLANEOUS) ×4 IMPLANT
COVER TABLE BACK 60X90 (DRAPES) ×2 IMPLANT
EPINEPHRINE 1ML ×2 IMPLANT
FORCEPS BIOP RJ4 1.8 (CUTTING FORCEPS) IMPLANT
GLOVE SURG SIGNA 7.5 PF LTX (GLOVE) ×2 IMPLANT
KIT ROOM TURNOVER OR (KITS) ×2 IMPLANT
MARKER SKIN DUAL TIP RULER LAB (MISCELLANEOUS) ×2 IMPLANT
NEEDLE BIOPSY TRANSBRONCH 21G (NEEDLE) IMPLANT
NEEDLE SYS SONOTIP II EBUSTBNA (NEEDLE) ×2 IMPLANT
NS IRRIG 1000ML POUR BTL (IV SOLUTION) ×2 IMPLANT
OIL SILICONE PENTAX (PARTS (SERVICE/REPAIRS)) IMPLANT
PAD ARMBOARD 7.5X6 YLW CONV (MISCELLANEOUS) ×4 IMPLANT
SPONGE GAUZE 4X4 12PLY (GAUZE/BANDAGES/DRESSINGS) IMPLANT
SYR 20CC LL (SYRINGE) ×2 IMPLANT
SYR 20ML ECCENTRIC (SYRINGE) ×2 IMPLANT
SYR 5ML LUER SLIP (SYRINGE) IMPLANT
SYR TOOMEY 50ML (SYRINGE) IMPLANT
TOWEL OR 17X24 6PK STRL BLUE (TOWEL DISPOSABLE) ×2 IMPLANT
TRAP SPECIMEN MUCOUS 40CC (MISCELLANEOUS) ×2 IMPLANT
TUBE CONNECTING 12X1/4 (SUCTIONS) ×2 IMPLANT

## 2011-09-13 NOTE — Anesthesia Preprocedure Evaluation (Addendum)
Anesthesia Evaluation  Patient identified by MRN, date of birth, ID band Patient awake    Reviewed: Allergy & Precautions, H&P , NPO status   Airway Mallampati: II TM Distance: >3 FB     Dental  (+) Teeth Intact   Pulmonary    + decreased breath sounds      Cardiovascular Rhythm:Regular Rate:Normal     Neuro/Psych    GI/Hepatic   Endo/Other    Renal/GU      Musculoskeletal   Abdominal   Peds  Hematology   Anesthesia Other Findings   Reproductive/Obstetrics                           Anesthesia Physical Anesthesia Plan  ASA: III  Anesthesia Plan: General   Post-op Pain Management:    Induction: Intravenous  Airway Management Planned: Oral ETT  Additional Equipment:   Intra-op Plan:   Post-operative Plan: Extubation in OR  Informed Consent: I have reviewed the patients History and Physical, chart, labs and discussed the procedure including the risks, benefits and alternatives for the proposed anesthesia with the patient or authorized representative who has indicated his/her understanding and acceptance.   Dental advisory given  Plan Discussed with:   Anesthesia Plan Comments: (Lung Ca with R. Lung complete opacification. Htn  Kipp Brood, MD )       Anesthesia Quick Evaluation

## 2011-09-13 NOTE — Anesthesia Procedure Notes (Signed)
Procedure Name: Intubation Date/Time: 09/13/2011 11:55 AM Performed by: Beth [name] A Pre-anesthesia Checklist: Patient identified, Emergency Drugs available, Suction available, Patient being monitored and Timeout performed Patient Re-evaluated:Patient Re-evaluated prior to inductionOxygen Delivery Method: Circle system utilized Preoxygenation: Pre-oxygenation with 100% oxygen Intubation Type: IV induction and Rapid sequence Laryngoscope Size: Mac and 4 Grade View: Grade I Tube type: Oral Tube size: 8.5 mm Number of attempts: 1 Airway Equipment and Method: Stylet and LTA kit utilized Placement Confirmation: ETT inserted through vocal cords under direct vision,  positive ETCO2 and breath sounds checked- equal and bilateral Secured at: 21 cm Tube secured with: Tape Dental Injury: Teeth and Oropharynx as per pre-operative assessment

## 2011-09-13 NOTE — Progress Notes (Signed)
Dr. Noreene Larsson notified of Hgb.  7.8 today. Also reviewed EKG with him. He will evaluate pt. In Holding area.

## 2011-09-13 NOTE — Progress Notes (Signed)
EKG shown to [employer] [facility] . Pt. Does not have any previous EKG's for comparison.

## 2011-09-13 NOTE — H&P (View-Only) (Signed)
Name: Beth [name] MRN: [id number] DOB: 04-26-1960  Referring Physician:  Caryl Never Reason for Consultation:  Lung mass / pleural effusion   INTERVENTIONAL PULMONOLOGY CONSULTATION NOTE   History of Present Illness: 51 year old smoker with progressive dyspnea since several month ago associated with dry cough.  No fever, chills, hemoptysis.  Does report night sweats.  Never had similar symptoms in the past.  Chest imaging demonstrated large right hilar mass and pleural effusion.  Patient referred for further evaluation and treatment.  Interval history:  Called the office yesterday complaining more dyspnea, suspects effusion is back.  Vital Signs: Filed Vitals:   09/08/11 1337  BP: 106/80  Pulse: 119  Temp: 98.6 F (37 C)  TempSrc: Oral  Height: 5\' 5"  (1.651 m)  Weight: 233 lb (105.688 kg)  SpO2: 93%   Physical Examination: General:  Comfortable Neuro:  Alert, oriented, nonfocal HEENT:  Pink conjunctivae, moist mucus membranes Neck:  No lymphadenopathy Cardiovascular:  RRR, no murmurs Lungs:  Diminished air entry on the right Abdomen:  Obese, soft, nontender, nondistended Musculoskeletal:  No clubbing, cyanosis or edema Skin: No rash   Labs: No results found for this basename: HGB:3,HCT:3,WBC:3,PLT:3,NA:5,K:2,CL:5,CO2:5,GLUCOSE:5,BUN:5,CREATININE:5,CALCIUM:5,MG:5,PHOS:5,INR:5,APTT:5 in the last 168 hours  CXR  6/19 >>> Large right pleural effusion  Chest CT(images reviewed):  Large right-sided hilar mass with evidence of airway cutoff. Large right-sided loculated pleural effusion with right lung collapse. Bilateral mediastinal lymphadenopathy.  Extensive multifocal sclerotic bone lesions.  PFT:  NA  TTE:  NA  Pleural fluid cytology: 6/13 >>> Atypical cohesive epithelioid cells  ASSESSMENT AND PLAN  Large right hilar mass suspicious for malignancy associated with mediastinal lymphadenopathy and sclerotic bone lesions suspicious for metastatic disease Large right  pleural effusion, likely malignant Mild intermittent asthma, stable  -->  Will schedule US guided thoracentesis to alleviate symptoms -->  Will resend large volume sample for cytology -->  May eventually need PleurX placement -->  Schedule Bronchoscopy / EBUS in OR next week in case pleural cytology is again nondiagnotic.  Orlean Bradford, M.D., F.C.C.P. Interventional Pulmonology [facility] Cell: [phone] Pager: [phone]  09/08/2011, 2:30 PM

## 2011-09-13 NOTE — Op Note (Signed)
Name:  Ajahnae [name] MRN:  [id number] DOB:  [birth year]  OPERATIVE NOTE  Procedure(s): Flexible bronchoscopy [phone]) Endobronchial ultrasound ([id number]) Transbronchial needle aspiration ([id number]) of the right hilar mass  Indications: Right hilar mass  Consent:  Procedure, benefits, risks and alternatives discussed.  Questions answered.  Consent obtained.  Anesthesia:  General endotracheal.  Procedure summary:  Appropriate equipment was assembled.  The patient was brought to the operating room and identified as Beth [name].  Safety timeout was performed. The patient was placed supine on the operating table, airway established and general anesthesia administered by Anesthesia team.   After the appropriate level of anesthesia was assured, flexible video bronchoscope was lubricated and inserted through the endotracheal tube.  Airway examination was performed bilaterally to subsegmental level.  Minimal clear secretions were noted.  Mucosa appeared normal and no endobronchial lesions were identified. There was significant extrinsic compression of bronchus intermedius and lobar bronchi on the left.  Endobronchial ultrasound video bronchoscope was then lubricated and inserted through the endotracheal tube. Endobronchial ultrasound guided transbronchial needle aspiration of right hilar mass was performed and the specimen was sent for immediate reading.  Immediately after the first pass the patient was noted to be difficult to ventilate.  The EBUS bronchoscope was withdrawn.  Regular bronchoscope was inserted and the endotracheal tube was noted to be obstructed by clot / soft tissue mass which appeared to be partially attached to the tracheal wall.  It was freed using ETT manipulation and bronchoscope suction and removed.  Normal ventilation resumed.  There was no significant desaturation noted during the event.  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  The EBUS bronchoscope was used again to obtain additional TBNA samples of the right hilar mass.    After hemostasis was assured, the bronchoscope was withdrawn.  The patient was extubated in operating room and transferred to PACU.   Specimens sent: TBNA sample of the right hilar mass Clot / soft tissue mass  Complications:  As above.  Hemodynamic parameters and oxygenation remained stable throughout the procedure.  Estimated blood loss:  Less then 5 mL.  Orlean Bradford, M.D. Pulmonary and Critical Care Medicine [facility] Cell: [phone]  09/13/2011, 6:52 PM

## 2011-09-13 NOTE — Anesthesia Postprocedure Evaluation (Signed)
  Anesthesia Post-op Note  Patient: Beth [name]  Procedure(s) Performed: Procedure(s) (LRB): VIDEO BRONCHOSCOPY WITH ENDOBRONCHIAL ULTRASOUND (Bilateral)  Patient Location: PACU  Anesthesia Type: General  Level of Consciousness: awake, alert  and oriented  Airway and Oxygen Therapy: Patient Spontanous Breathing and Patient connected to nasal cannula oxygen  Post-op Pain: mild  Post-op Assessment: Post-op Vital signs reviewed and Patient's Cardiovascular Status Stable  Post-op Vital Signs: stable  Complications: No apparent anesthesia complications

## 2011-09-13 NOTE — Transfer of Care (Signed)
Immediate Anesthesia Transfer of Care Note  Patient: Beth [name]  Procedure(s) Performed: Procedure(s) (LRB): VIDEO BRONCHOSCOPY WITH ENDOBRONCHIAL ULTRASOUND (Bilateral)  Patient Location: PACU  Anesthesia Type: General  Level of Consciousness: awake, alert , oriented and patient cooperative  Airway & Oxygen Therapy: Patient Spontanous Breathing and Patient connected to face mask oxygen  Post-op Assessment: Report given to PACU RN and Post -op Vital signs reviewed and stable  Post vital signs: Reviewed and stable  Complications: No apparent anesthesia complications

## 2011-09-13 NOTE — Interval H&P Note (Signed)
No changes since last seen.  Consent obtained. 

## 2011-09-14 ENCOUNTER — Telehealth: Payer: Self-pay | Admitting: Pulmonary Disease

## 2011-09-14 ENCOUNTER — Telehealth: Payer: Self-pay | Admitting: Family Medicine

## 2011-09-14 DIAGNOSIS — C3490 Malignant neoplasm of unspecified part of unspecified bronchus or lung: Secondary | ICD-10-CM

## 2011-09-14 NOTE — Telephone Encounter (Signed)
Pt returned call. Beth [name]  

## 2011-09-14 NOTE — Telephone Encounter (Signed)
Per Dr. Herma Carson Pt needs to be referred to Dr. Shirline Frees next week and needs PET scan   Order's have been placed. Please advise PCC's, thanks

## 2011-09-14 NOTE — Telephone Encounter (Signed)
lmomtcb x1 for pt 

## 2011-09-14 NOTE — Telephone Encounter (Signed)
Referral order has been faxed to [facility].Kandice Hams

## 2011-09-14 NOTE — Telephone Encounter (Signed)
Pt called re: pulmonary surgery that she done yesterday. Pt said that she would like Beth [name] to call her today before leaving office.

## 2011-09-14 NOTE — Telephone Encounter (Signed)
lmomtcb x1 

## 2011-09-14 NOTE — Telephone Encounter (Signed)
Attempted call back, I was talking with her mother on her cell phone, she was driving.  We were disconnected.  I will try again tomorrow.

## 2011-09-15 NOTE — Telephone Encounter (Signed)
Pet scan has been scheduled for 09/20/11 @ [facility], pt mom was notified and she willl tell pt, done 09/14/11 .Kandice Hams

## 2011-09-15 NOTE — Telephone Encounter (Signed)
I spoke with pt mother, she is not sure why pt called.  Pt is home and doing OK, mother will check on her tomorrow and have daughter dall again if needed

## 2011-09-16 ENCOUNTER — Telehealth: Payer: Self-pay | Admitting: *Deleted

## 2011-09-16 ENCOUNTER — Telehealth: Payer: Self-pay | Admitting: Internal Medicine

## 2011-09-16 NOTE — Telephone Encounter (Signed)
Received PC from pt asking if all of her recent testing and procedures, surgery reports be faxed to [facility].  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

## 2011-09-16 NOTE — Telephone Encounter (Signed)
l/m on home and wrk # to call for new pt appt  aom

## 2011-09-16 NOTE — Telephone Encounter (Signed)
OK to fax.

## 2011-09-17 ENCOUNTER — Telehealth: Payer: Self-pay | Admitting: Internal Medicine

## 2011-09-17 NOTE — Telephone Encounter (Signed)
s/w pt and she cannot come in on 7/3 as she will be out of town,advised her she should keep this appt and she declined.states she will wait and she wht her pet says.  dana aware/aom

## 2011-09-17 NOTE — Telephone Encounter (Signed)
pt called and made decision to make the 7/3 appt    aom

## 2011-09-17 NOTE — Telephone Encounter (Signed)
Pt informed medical records up to today faxed to [facility], pt informed

## 2011-09-20 ENCOUNTER — Encounter (HOSPITAL_COMMUNITY)
Admission: RE | Admit: 2011-09-20 | Discharge: 2011-09-20 | Disposition: A | Discharge status: Home / Self Care | Payer: 59 | Source: Ambulatory Visit | Attending: Pulmonary Disease | Admitting: Pulmonary Disease

## 2011-09-20 ENCOUNTER — Telehealth: Payer: Self-pay | Admitting: *Deleted

## 2011-09-20 ENCOUNTER — Telehealth: Payer: Self-pay | Admitting: Internal Medicine

## 2011-09-20 DIAGNOSIS — C7951 Secondary malignant neoplasm of bone: Secondary | ICD-10-CM | POA: Insufficient documentation

## 2011-09-20 DIAGNOSIS — C770 Secondary and unspecified malignant neoplasm of lymph nodes of head, face and neck: Secondary | ICD-10-CM | POA: Insufficient documentation

## 2011-09-20 DIAGNOSIS — J90 Pleural effusion, not elsewhere classified: Secondary | ICD-10-CM | POA: Insufficient documentation

## 2011-09-20 DIAGNOSIS — C3490 Malignant neoplasm of unspecified part of unspecified bronchus or lung: Secondary | ICD-10-CM | POA: Insufficient documentation

## 2011-09-20 DIAGNOSIS — C771 Secondary and unspecified malignant neoplasm of intrathoracic lymph nodes: Secondary | ICD-10-CM | POA: Insufficient documentation

## 2011-09-20 HISTORY — DX: Secondary malignant neoplasm of bone: C79.51

## 2011-09-20 MED ORDER — FLUDEOXYGLUCOSE F - 18 (FDG) INJECTION
19.6000 | Freq: Once | INTRAVENOUS | Status: AC | PRN
  Administered 2011-09-20: 19.6 via INTRAVENOUS

## 2011-09-20 MED FILL — Epinephrine HCl Inj 1 MG/ML: INTRAMUSCULAR | Qty: 1 | Status: AC

## 2011-09-20 NOTE — Telephone Encounter (Signed)
Pt called requesting her recent medical records be faxed to the [facility], records since she saw Dr Caryl Never with diagnosis of lung CA.  This was done this AM.  Pt also requesting a letter be sent with the information discussed at OV with Dr Caryl Never with CXR, CT report, diagnosis.  Pt wants to be sure Dr Caryl Never indicates she will need permanent disability.  Pt had a PET scan done today and "she is really nervous waiting for report"  State fax [phone], attn:  Tiburcio Bash

## 2011-09-20 NOTE — Telephone Encounter (Signed)
Referrred by Dr. Blima Dessert Dx Lung Ca

## 2011-09-22 ENCOUNTER — Encounter: Payer: Self-pay | Admitting: *Deleted

## 2011-09-22 ENCOUNTER — Other Ambulatory Visit (HOSPITAL_COMMUNITY)
Admission: RE | Admit: 2011-09-22 | Discharge: 2011-09-22 | Disposition: A | Discharge status: Home / Self Care | Payer: 59 | Source: Ambulatory Visit | Attending: Internal Medicine | Admitting: Internal Medicine

## 2011-09-22 ENCOUNTER — Ambulatory Visit (HOSPITAL_BASED_OUTPATIENT_CLINIC_OR_DEPARTMENT_OTHER): Payer: 59 | Admitting: Internal Medicine

## 2011-09-22 ENCOUNTER — Ambulatory Visit (HOSPITAL_BASED_OUTPATIENT_CLINIC_OR_DEPARTMENT_OTHER): Payer: 59

## 2011-09-22 ENCOUNTER — Other Ambulatory Visit (HOSPITAL_BASED_OUTPATIENT_CLINIC_OR_DEPARTMENT_OTHER): Payer: 59 | Admitting: Lab

## 2011-09-22 VITALS — BP 125/86 | HR 103 | Temp 97.4°F | Ht 65.0 in | Wt 234.8 lb

## 2011-09-22 DIAGNOSIS — C3490 Malignant neoplasm of unspecified part of unspecified bronchus or lung: Secondary | ICD-10-CM | POA: Insufficient documentation

## 2011-09-22 DIAGNOSIS — D649 Anemia, unspecified: Secondary | ICD-10-CM

## 2011-09-22 DIAGNOSIS — C771 Secondary and unspecified malignant neoplasm of intrathoracic lymph nodes: Secondary | ICD-10-CM

## 2011-09-22 DIAGNOSIS — C7951 Secondary malignant neoplasm of bone: Secondary | ICD-10-CM

## 2011-09-22 DIAGNOSIS — C3400 Malignant neoplasm of unspecified main bronchus: Secondary | ICD-10-CM

## 2011-09-22 LAB — CBC WITH DIFFERENTIAL/PLATELET
Basophils Absolute: 0.1 10*3/uL (ref 0.0–0.1)
EOS%: 5.7 % (ref 0.0–7.0)
Eosinophils Absolute: 0.5 10*3/uL (ref 0.0–0.5)
HCT: 25.2 % — ABNORMAL LOW (ref 34.8–46.6)
HGB: 7.9 g/dL — ABNORMAL LOW (ref 11.6–15.9)
MCH: 26 pg (ref 25.1–34.0)
MCV: 83.4 fL (ref 79.5–101.0)
MONO%: 7.9 % (ref 0.0–14.0)
NEUT#: 5.4 10*3/uL (ref 1.5–6.5)
NEUT%: 58 % (ref 38.4–76.8)

## 2011-09-22 LAB — COMPREHENSIVE METABOLIC PANEL
AST: 11 U/L (ref 0–37)
Albumin: 3.5 g/dL (ref 3.5–5.2)
Alkaline Phosphatase: 102 U/L (ref 39–117)
BUN: 14 mg/dL (ref 6–23)
Calcium: 9.1 mg/dL (ref 8.4–10.5)
Chloride: 102 mEq/L (ref 96–112)
Creatinine, Ser: 0.68 mg/dL (ref 0.50–1.10)
Glucose, Bld: 93 mg/dL (ref 70–99)

## 2011-09-22 MED ORDER — CYANOCOBALAMIN 1000 MCG/ML IJ SOLN
1000.0000 ug | Freq: Once | INTRAMUSCULAR | Status: AC
  Administered 2011-09-22: 1000 ug via INTRAMUSCULAR

## 2011-09-22 MED ORDER — DEXAMETHASONE 4 MG PO TABS: 4.0000 mg | ORAL_TABLET | ORAL | Status: AC

## 2011-09-22 MED ORDER — PROCHLORPERAZINE MALEATE 10 MG PO TABS: 10.0000 mg | ORAL_TABLET | Freq: Four times a day (QID) | ORAL | Status: DC | PRN

## 2011-09-22 MED ORDER — INTEGRA 62.5-62.5-40-3 MG PO CAPS: 1.0000 | ORAL_CAPSULE | Freq: Every day | ORAL | Status: DC

## 2011-09-22 MED ORDER — FOLIC ACID 1 MG PO TABS: 1.0000 mg | ORAL_TABLET | Freq: Every day | ORAL | Status: DC

## 2011-09-22 NOTE — Progress Notes (Signed)
[facility] Telephone:([phone]) [phone]   Fax:([phone]  CONSULT NOTE  REASON FOR CONSULTATION:  51 years old [place] American female diagnosed with lung cancer.  HPI Beth [name] is a 51 y.o. female was past medical history significant for hypertension, GERD, fibromyalgia and arthritis as well as long history of smoking. The patient mentions that she has been complaining of shortness of breath on and off for the last year. That was getting worse and she was seen by her primary care physician Dr. Caryl Never. Chest x-ray was performed on 08/29/2011 and it showed complete opacification of the right lung due to effusion, dense airspace consolidation or obstructing mass with atelectasis. CT scan of the chest was performed on 08/31/2011 and it showed large right-sided central obstructing mass measuring 8.8x6.1x13.2 CM. There was significant narrowing of the branches of the right mainstem bronchus and marked attenuation of the branches of the right pulmonary artery. There was also bilateral mediastinal lymph node metastases and large loculated right pleural effusion. The patient also has extensive multifocal sclerotic bone metastasis. Ultrasound-guided thoracentesis of the right pleural fluid was performed on 09/03/2011 with drainage of 800 mL of pleural fluid. The cytology showed atypical cells. On 09/13/2014 the patient underwent flexible bronchoscopy, endobronchial ultrasound and transbronchial needle aspiration of the right hilar mass under the care of Dr. Marin Shutter. The final pathology showed the Wang needed fine-needle aspiration of the right hilar mass was positive for adenocarcinoma. The immunohistochemical stains that were performed and showed the malignant cells were positive for TTF-1 and cytokeratin 7 but negative for cytokeratin 5/60, P63 and thyroglobulin. The immunohistochemical stains phenotype along with the morphology was consistent with an adenocarcinoma of lung  primary. A PET scan on 09/20/2011 showed central obstructing mass in the right lung with near complete collapse and associated hypermetabolism with maximum SUV of 7.4 corresponding to the known lung adenocarcinoma. There was also right prevascular, paratracheal and subcarinal nodal metastasis and suspected large malignant right pleural effusion.  There was also diffuse sclerotic osseous metastases throughout the visualized axial and appendicular skeleton. Patient was referred to me today for further evaluation and recommendation regarding treatment of her condition.  When seen today she continues to complain of pain on the right side of her chest as well as shortness of breath and cough but no hemoptysis,she lost around 15 pounds in the last 2 years the patient also complains of headache and blurred vision in addition to occasional nausea.  The patient is divorced and has one son age 67. She was to work for the [employer]. She has a history of smoking one pack per day for around 25 years quit last month. She also used to drink alcohol at regular basis but not recently and no history of drug abuse. The patient has plans to move close to her family in [place] in August 2013.  @SFHPI @  Past Medical History  Diagnosis Date  . Asthma   . GERD (gastroesophageal reflux disease)   . Hypertension   . Heart murmur   . Fibromyalgia   . Arthritis of knee, degenerative   . Endocarditis     as teenager  . Complication of anesthesia     difficulty waking up  . Shortness of breath   . Lung mass     R  . Sleep apnea     no longer using CPAP    Past Surgical History  Procedure Date  . Tubal ligation 2002    Family History  Problem Relation Age of Onset  . Hypertension Mother   . Alcohol abuse Father   . Hypertension Maternal Aunt   . Hypertension Maternal Uncle   . Hypertension Maternal Grandmother   . Hypertension Maternal Grandfather     Social History History  Substance  Use Topics  . Smoking status: Former Smoker -- 0.5 packs/day for 15 years    Types: Cigarettes    Quit date: 01/01/2011  . Smokeless tobacco: Not on file  . Alcohol Use: No    Allergies  Allergen Reactions  . Meloxicam     Possible GI bleed    Current Outpatient Prescriptions  Medication Sig Dispense Refill  . ARIPiprazole (ABILIFY) 5 MG tablet Take 5 mg by mouth daily.      Marland Kitchen esomeprazole (NEXIUM) 40 MG capsule Take 40 mg by mouth daily before breakfast.      . fish oil-omega-3 fatty acids 1000 MG capsule Take 2 g by mouth daily.      . hydrochlorothiazide (HYDRODIURIL) 25 MG tablet Take 25 mg by mouth daily.      Marland Kitchen lamoTRIgine (LAMICTAL) 25 MG tablet Take 25 mg by mouth daily.      . montelukast (SINGULAIR) 10 MG tablet Take 10 mg by mouth at bedtime.      . Multiple Vitamins-Minerals (MULTIVITAMIN WITH MINERALS) tablet Take 1 tablet by mouth daily.      . Venlafaxine HCl 150 MG TB24 Take 150 mg by mouth daily.      . VENTOLIN HFA 108 (90 BASE) MCG/ACT inhaler Inhale 2 puffs into the lungs every 4 (four) hours as needed. For wheezing      . Vitamin D, Ergocalciferol, (DRISDOL) 50000 UNITS CAPS Take 50,000 Units by mouth every 7 (seven) days. On wednesdays       Current Facility-Administered Medications  Medication Dose Route Frequency Provider Last Rate Last Dose  . cyanocobalamin ((VITAMIN B-12)) injection 1,000 mcg  1,000 mcg Intramuscular Once Si Gaul, MD        Review of Systems  A comprehensive review of systems was negative except for: Constitutional: positive for fatigue and weight loss Respiratory: positive for cough, dyspnea on exertion and pleurisy/chest pain Gastrointestinal: positive for nausea Musculoskeletal: positive for muscle weakness and myalgias  Physical Exam  ZOX:WRUEA, healthy, no distress, well nourished and well developed SKIN: skin color, texture, turgor are normal HEAD: Normocephalic, No masses, lesions, tenderness or abnormalities EYES:  normal EARS: External ears normal OROPHARYNX:no exudate and no erythema  NECK: supple, no adenopathy LYMPH:  no palpable lymphadenopathy, no hepatosplenomegaly BREAST: not examined. LUNGS: Dullness to percussion and decreased breath sounds at the lower right lung field, clear to auscultation on the left HEART: regular rate & rhythm, no murmurs and no gallops ABDOMEN:abdomen soft, non-tender, normal bowel sounds and no masses or organomegaly BACK: Back symmetric, no curvature. EXTREMITIES:no joint deformities, effusion, or inflammation, no edema, no skin discoloration, no clubbing, no cyanosis  NEURO: alert & oriented x 3 with fluent speech, no focal motor/sensory deficits, gait normal  PERFORMANCE STATUS: ECOG 1  LABORATORY DATA: Lab Results  Component Value Date   WBC 9.3 09/22/2011   HGB 7.9* 09/22/2011   HCT 25.2* 09/22/2011   MCV 83.4 09/22/2011   PLT 564* 09/22/2011      Chemistry      Component Value Date/Time   NA 139 09/13/2011 0907   K 3.7 09/13/2011 0907   CL 100 09/13/2011 0907   CO2 28 09/13/2011 0907   BUN 15 09/13/2011  4696   CREATININE 0.74 09/13/2011 0907      Component Value Date/Time   CALCIUM 9.1 09/13/2011 0907   ALKPHOS 91 09/13/2011 0907   AST 14 09/13/2011 0907   ALT 8 09/13/2011 0907   BILITOT 0.2* 09/13/2011 0907       RADIOGRAPHIC STUDIES: Dg Chest 1 View  09/09/2011  *RADIOLOGY REPORT*  Clinical Data: Post right thoracentesis.  CHEST - 1 VIEW  Comparison: 09/08/2011  Findings: Continued near complete opacification of the right hemithorax.  Stable appearance.  No pneumothorax following thoracentesis.  Left lung is clear.  IMPRESSION: Stable appearance.  No pneumothorax.  Original Report Authenticated By: Cyndie Chime, M.D.   Dg Chest 1 View  09/02/2011  *RADIOLOGY REPORT*  Clinical Data: Status post right-sided thoracentesis.  CHEST - 1 VIEW  Comparison: Chest x-ray 08/27/2011.  Findings: Compared to the prior examination the size of the large right-sided  pleural fluid collection has slightly decreased.  There is now an air-fluid level within this collection, compatible with a hydropneumothorax (the pneumothorax component is rather small). There is now a small amount of aerated lung in the right perihilar region.  Known pulmonary nodules in the left lower lobe are not well visualized on today's examination.  The aerated left lung is remarkable for pulmonary venous congestion without frank pulmonary edema.  Heart size is mildly enlarged.  IMPRESSION: 1.  Right-sided hydropneumothorax following thoracentesis.  The pneumothorax component is small, while the size of the right-sided pleural effusion has slightly decreased, and there is now some aeration to the right lung in the perihilar region.  These results were discussed by telephone on 09/02/2011  at  02:50 p.m. to  Michael Litter, PA, who verbally acknowledged these results.  Original Report Authenticated By: Florencia Reasons, M.D.   Dg Chest 2 View  09/13/2011  *RADIOLOGY REPORT*  Clinical Data: Cardiac and pulmonary history, asthma, hypertension, bronchoscopy  CHEST - 2 VIEW  Comparison: 09/09/2011  Findings: Persistent sub total opacification of right hemithorax by large right pleural effusion and pulmonary atelectasis. Underlying infiltrate and tumor not excluded. Upper normal heart size. Pulmonary vascular congestion. Question minimal left upper lobe infiltrate. Remaining left lung clear. No pneumothorax. Sclerosis at proximal right humerus question prior infarct.  IMPRESSION: No interval change.  Original Report Authenticated By: Lollie Marrow, M.D.   Dg Chest 2 View  09/08/2011  *RADIOLOGY REPORT*  Clinical Data: Pleural effusion.  CHEST - 2 VIEW  Comparison: CT chest 08/30/2011 and plain film chest 09/02/2011  Findings: Near complete whiteout of the right hemithorax is again seen.  Small focus of aerated lung is seen in the right mid chest. Left lung remains clear.  Heart size is upper normal.   Multifocal sclerotic lesions consistent with metastatic disease is again identified.  IMPRESSION:  1.  No change in near complete whiteout of the right chest consistent with large effusion and extensive airspace disease. 2.  Multifocal osseous metastases.  Original Report Authenticated By: Bernadene Bell. Maricela Curet, M.D.   Dg Chest 2 View  08/27/2011  *RADIOLOGY REPORT*  Clinical Data: Cough and decreased breath sounds  CHEST - 2 VIEW  Comparison: None  Findings: Heart size appears enlarged.  There is complete opacification of the right hemidiaphragm.  Patchy airspace densities are noted within the left lung.  No left pleural effusion or edema.  IMPRESSION:  1.  Complete opacification of the right lung may be due to effusion, dense airspace consolidation or obstructing mass with atelectasis. 2.  Mild  patchy airspace disease is noted within the left lung  Original Report Authenticated By: Rosealee Albee, M.D.   Ct Chest W Contrast  08/30/2011  *RADIOLOGY REPORT*  Clinical Data: Cough and shortness of breath.  Abnormal chest radiograph  CT CHEST WITH CONTRAST  Technique:  Multidetector CT imaging of the chest was performed following the standard protocol during bolus administration of intravenous contrast.  Contrast: 80mL OMNIPAQUE IOHEXOL 300 MG/ML  SOLN  Comparison: 08/27/2011  Findings: There is no supraclavicular or axillary adenopathy. There are multiple enlarged mediastinal lymph nodes.  Prevascular lymph node anterior to the superior vena cava measures 1.3 cm, image 20.  Precarinal lymph node measures 1.1 cm, image 21.  There is a left-sided mediastinal lymph node adjacent to the transverse aortic arch which measures 1.3 cm, image 19.  The patient has a large loculated right pleural effusion.  Large, right-sided central obstructing mass measures 8.8 x 6.1 x 13.2 cm.  There is significant narrowing of the branches of the right mainstem bronchus.  Marked attenuation of the branches of the right pulmonary artery  is also present.  There is a pulmonary nodule identified within the left lower lobe which measures 0.9 cm. Nonspecific area of ground-glass attenuation is present within the left upper lobe.  Review of the visualized osseous structures is significant for extensive multifocal sclerotic bone metastases.  No pathologic fractures identified.  IMPRESSION:  1.  Large, right-sided central obstructing mass.  Worrisome for primary bronchogenic carcinoma. 2.  Bilateral mediastinal lymph node metastasis. 3.  Large loculated right pleural effusion.  Therapeutic and diagnostic thoracentesis may be helpful. 4.  Extensive multifocal sclerotic bone metastases.  Original Report Authenticated By: Rosealee Albee, M.D.   Nm Pet Image Initial (pi) Skull Base To Thigh  09/20/2011  *RADIOLOGY REPORT*  Clinical Data: Initial treatment strategy for lung cancer.  NUCLEAR MEDICINE PET SKULL BASE TO THIGH  Fasting Blood Glucose:  86  Technique:  19.6 mCi F-18 FDG was injected intravenously. CT data was obtained and used for attenuation correction and anatomic localization only.  (This was not acquired as a diagnostic CT examination.) Additional exam technical data entered on technologist worksheet.  Comparison:  CT chest dated 08/30/2011  Findings:  Neck: No hypermetabolic lymph nodes in the neck.  Chest:  Near complete collapse of the right lung with associated central obstructing mass/hypermetabolism, max SUV 7.4, corresponding to known lung adenocarcinoma.  8 and 12 mm left lower lobe pulmonary nodules (series 2/images 110 and 117), without convincing hypermetabolism.  1.9 cm right prevascular node (series 2/image 92), max SUV 17.3. Additional right paratracheal and subcarinal nodal metastases, max SUV 6.9 (PET image 97).  Large right pleural effusion with associated pleural thickening and internal debris (series 2/image 132), suggesting malignant effusion.  Foci of gas posteriorly (series 2/image 101), possibly related to prior  thoracentesis, although not nondependent.  Abdomen/Pelvis:  No abnormal hypermetabolic activity within the liver, pancreas, adrenal glands, or spleen.  Malrotated right kidney.  Mildly heterogeneous uterus, possibly reflecting uterine fibroids.  Small fat-containing left inguinal hernia.  No hypermetabolic lymph nodes in the abdomen or pelvis.  [place]:  Diffuse sclerotic osseous metastases throughout the visualized axial and appendicular skeleton.  Associated hypermetabolism in the right sternum, max SUV 7.4 (PET image 82).  IMPRESSION: Central obstructing mass in the right lung with near complete collapse and associated hypermetabolism, max SUV 7.4, corresponding to known lung adenocarcinoma.  Right prevascular, paratracheal, and subcarinal nodal metastases, as described above, max SUV 17.3.  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  Diffuse sclerotic osseous metastases throughout the visualized axial and appendicular skeleton, max SUV 7.4.  Original Report Authenticated By: Charline Bills, M.D.   US Thoracentesis Asp Pleural Space W/img Guide  09/09/2011  *RADIOLOGY REPORT*  Clinical Data:  Right-sided hilar mass, right-sided pleural effusion  ULTRASOUND GUIDED right THORACENTESIS  Comparison:  Previous thoracentesis on 09/02/11  An ultrasound guided thoracentesis was thoroughly discussed with the patient and questions answered.  The benefits, risks, alternatives and complications were also discussed.  The patient understands and wishes to proceed with the procedure.  Written consent was obtained.  Ultrasound was performed to localize and mark an adequate pocket of fluid in the right chest.  There were numerous loculations seen and thickened pleura. The lung was also densely consolidated as well. The largest of the collections was marked for aspiration. The area was then prepped and draped in the normal sterile fashion.  1% Lidocaine was used for local anesthesia.  Under ultrasound guidance a 19  gauge Yueh catheter was introduced.  Thoracentesis was performed, but yielded only approximately 25cc of blood tinged fluid. The catheter was removed and another thorough US exam was done by Dr. Fredia Sorrow but another suitable location for thoracentesis was not found. A dressing was placed.  Complications:  None  Findings: A total of approximately 25 ml of blood tinged fluid was removed. A fluid sample was sent for laboratory analysis.  IMPRESSION: Successful ultrasound guided right thoracentesis yielding 25 ml of pleural fluid.  The pt states she is scheduled for bronchoscopy which is likely to yield better results for tissue diagnosis.  Procedure completed with Dr. Fredia Sorrow.  Read by Brayton El PA-C  Original Report Authenticated By: Reola Calkins, M.D.   US Thoracentesis Asp Pleural Space W/img Guide  09/03/2011  *RADIOLOGY REPORT*  Clinical Data:  Symptomatic right pleural effusion of uncertain etiology.  Request has been made for large right-sided thoracentesis.  ULTRASOUND GUIDED right THORACENTESIS  Comparison:  CT imaging of the chest.  An ultrasound guided thoracentesis was thoroughly discussed with the patient and questions answered.  The benefits, risks, alternatives and complications were also discussed.  The patient understands and wishes to proceed with the procedure.  Written consent was obtained.  Ultrasound was performed to localize and mark an adequate pocket of fluid in the right chest.  The area was then prepped and draped in the normal sterile fashion.  1% Lidocaine was used for local anesthesia.  Under ultrasound guidance a 19 gauge Yueh catheter was introduced.  Thoracentesis was performed. During aspiration the patient moved pulling the Griffith Citron out of position and the procedure was aborted. A dressing was applied and patient went for post CXR which was reviewed with radiologist, Dr. Vinnie Level.  Complications:  None immediate  Findings: A total of approximately 800 ml of bloody serous  fluid was removed. A fluid sample was sent for laboratory analysis.  IMPRESSION: Successful ultrasound guided right thoracentesis yielding 800 ml of pleural fluid.  Read by: Anselm Pancoast, P.A.-C  Original Report Authenticated By: Reola Calkins, M.D.    ASSESSMENT: This is a very pleasant 51 years old African American female diagnosed with metastatic non-small cell lung cancer, adenocarcinoma presented with large right lung mass in addition to mediastinal lymphadenopathy a right pleural effusion and extensive bone metastasis.  PLAN: I have a lengthy discussion with the patient today about her disease stage, prognosis and treatment options. I recommended for the patient the following: #1 I will  complete the staging workup I ordered an MRI of the brain to rule out any brain metastasis. #2 I would sent a tissue block to be tested for EGFR mutation as well as ALK gene translocation. #3 if the patient has negative mutation status, I would consider her for systemic chemotherapy with carboplatin for AUC of 5 and Alimta 500 mg/M2 every 3 weeks. I discussed with the patient adverse effect of this treatment including but not limited to alopecia, myelosuppression, nausea and vomiting, peripheral neuropathy, liver or renal dysfunction. The patient agreed to the current plan. #4 the patient would have a chemotherapy education class before starting the first cycle of her treatment next week. #5 I would consider her for treatment with either Zometa or Xgeva after I receive dental clearance from her dentist. #6 she will receive vitamin B12 injection today. She was also given prescription for Compazine 10 mg by mouth every 6 hours as needed for nausea, folic acid 1 mg by mouth daily, Decadron 4 mg by mouth twice a day the day before, day of and day after the chemotherapy. #7 for anemia, I would start the patient on Integra plus1 capsule by mouth daily. #8 the patient would come back for followup visit in 2 weeks  for reevaluation and management any adverse effect of her chemotherapy. #9 I may consider referring the patient to radiation oncology for consideration of palliative radiotherapy to the right lung mass as well as the painful bony metastasis.  All questions were answered. The patient knows to call the clinic with any problems, questions or concerns. We can certainly see the patient much sooner if necessary.  Thank you so much for allowing me to participate in the care of Beth [name]. I will continue to follow up the patient with you and assist in her care.  I spent 35 minutes counseling the patient face to face. The total time spent in the appointment was 65 minutes.   Kaeya Schiffer K. 09/22/2011, 3:39 PM

## 2011-09-22 NOTE — Progress Notes (Signed)
Spoke with pt at [facility] today.  Gave educational/resource information with explaination.

## 2011-09-27 ENCOUNTER — Telehealth: Payer: Self-pay | Admitting: Medical Oncology

## 2011-09-27 ENCOUNTER — Telehealth: Payer: Self-pay | Admitting: *Deleted

## 2011-09-27 ENCOUNTER — Encounter: Payer: Self-pay | Admitting: Radiation Oncology

## 2011-09-27 ENCOUNTER — Other Ambulatory Visit: Payer: Self-pay | Admitting: Internal Medicine

## 2011-09-27 DIAGNOSIS — C3490 Malignant neoplasm of unspecified part of unspecified bronchus or lung: Secondary | ICD-10-CM

## 2011-09-27 LAB — FUNGUS CULTURE W SMEAR
Fungal Smear: NONE SEEN
Special Requests: NORMAL

## 2011-09-27 NOTE — Telephone Encounter (Signed)
Pt calling to request a letter be sent to the [facility], Attn: Tiburcio Bash, Fax [phone].  Letter needs to indicate pt will need permanent disability.  Pt found out on 7/11 that she has stage 4 lung cancer and has metastasized to other organs, chemo to start this week sometime.

## 2011-09-27 NOTE — Telephone Encounter (Signed)
Pt aware of appointment for xrt

## 2011-09-27 NOTE — Telephone Encounter (Signed)
Has questions about starting chemo and  left a message but not heard back . I told [name] per Dr Arbutus Ped that he has ordered appointment with Radiation oncologist to start treating the lung . He is waiting for results from  special genetic test and will see her in two weeks. I told her someone will call her back today . She voices understanding.

## 2011-09-27 NOTE — Telephone Encounter (Signed)
I told pt I did not have any paperwork .

## 2011-09-28 ENCOUNTER — Encounter: Payer: Self-pay | Admitting: Radiation Oncology

## 2011-09-28 ENCOUNTER — Ambulatory Visit
Admission: RE | Admit: 2011-09-28 | Discharge: 2011-09-28 | Disposition: A | Discharge status: Home / Self Care | Payer: 59 | Source: Ambulatory Visit | Attending: Radiation Oncology | Admitting: Radiation Oncology

## 2011-09-28 ENCOUNTER — Other Ambulatory Visit: Payer: Self-pay | Admitting: Medical Oncology

## 2011-09-28 VITALS — BP 115/83 | HR 110 | Temp 98.6°F | Resp 20 | Wt 236.5 lb

## 2011-09-28 DIAGNOSIS — C7952 Secondary malignant neoplasm of bone marrow: Secondary | ICD-10-CM | POA: Insufficient documentation

## 2011-09-28 DIAGNOSIS — C3490 Malignant neoplasm of unspecified part of unspecified bronchus or lung: Secondary | ICD-10-CM | POA: Insufficient documentation

## 2011-09-28 DIAGNOSIS — C7951 Secondary malignant neoplasm of bone: Secondary | ICD-10-CM | POA: Insufficient documentation

## 2011-09-28 DIAGNOSIS — Z79899 Other long term (current) drug therapy: Secondary | ICD-10-CM | POA: Insufficient documentation

## 2011-09-28 DIAGNOSIS — Z510 Encounter for antineoplastic radiation therapy: Secondary | ICD-10-CM | POA: Insufficient documentation

## 2011-09-28 DIAGNOSIS — C7931 Secondary malignant neoplasm of brain: Secondary | ICD-10-CM

## 2011-09-28 DIAGNOSIS — K219 Gastro-esophageal reflux disease without esophagitis: Secondary | ICD-10-CM | POA: Insufficient documentation

## 2011-09-28 DIAGNOSIS — I10 Essential (primary) hypertension: Secondary | ICD-10-CM | POA: Insufficient documentation

## 2011-09-28 HISTORY — DX: Depression, unspecified: F32.A

## 2011-09-28 HISTORY — DX: Major depressive disorder, single episode, unspecified: F32.9

## 2011-09-28 HISTORY — DX: Anxiety disorder, unspecified: F41.9

## 2011-09-28 NOTE — Progress Notes (Signed)
[facility] Radiation Oncology NEW PATIENT EVALUATION  Name: Beth [name] MRN: [id number]  Date:   09/28/2011           DOB: 03-29-1960  Status: outpatient   CC: [name], MD  Si Gaul, MD    REFERRING PHYSICIAN: Si Gaul, MD   DIAGNOSIS: Stage IV adenocarcinoma of the right lung   HISTORY OF PRESENT ILLNESS:  Beth [name] is a 51 y.o. female who is seen today for the courtesy of Dr. Arbutus Ped for consideration of a brief course of palliative radiotherapy in the management of her stage IV adenocarcinoma of the right lung. She states that she has had intermittent dyspnea over the past one to one half years. This worsened, and her primary care physician, Dr. Caryl Never obtained a chest x-ray on 08/29/2011 which showed complete opacification of the right lung due to a pleural effusion and centrally obstructing mass.. A CT scan of the chest from 08/31/2011 showed a large right-sided central obstructing mass measuring 8.8 x 6.1 x 13.2 cm. There was significant narrowing of the branches of the right mainstem bronchus and marked attenuation of the branches of the right pulmonary artery. There was also bilateral mediastinal lymph node metastases a large loculated right pleural effusion. The patient was also noted to have multifocal sclerotic bone metastases. She had drainage of 800 mL of pleural fluid on 09/03/2011 with cytology showing atypical cells. On 05/16/2011 she underwent flexible bronchoscopy with transbronchial needle aspiration of the right hilar mass  diagnostic for adenocarcinoma. A staging PET scan showed a central obstructing mass the right lung with near complete collapse along with mediastinal lymphadenopathy. There were diffuse sclerotic metastases seen throughout the axial and appendicular skeleton. She was seen in consultation by Dr. Arbutus Ped who has her seen today for consideration of a brief course of palliative radiation therapy. Special  stains/genetics are pending for selection of chemotherapy. She states that she has lost approximately 50 pounds over the past 2 years. Her appetite has been good. She does have chronic frontal headaches but these are unchanged. No history of change in vision, nausea, or vomiting. A brain MRI scan is pending for completion of her staging workup. She does report dyspnea on exertion.   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     PAST SURGICAL HISTORY:  Past Surgical History  Procedure Date  . Tubal ligation 2002  . Thoracentesis 09/02/11, 09/09/11    right-side pleural effusion     FAMILY HISTORY: family history includes Alcohol abuse in her father; Hypertension in her maternal aunt, maternal grandfather, maternal grandmother, maternal uncle, and mother; and Sarcoidosis in her sister. Her mother is alive and well at age 27. Her father died from alcoholic cirrhosis, unknown age.   SOCIAL HISTORY:  reports that she quit smoking about 8 months ago. Her smoking use included Cigarettes. She has a 7.5 pack-year smoking history. She does not have any smokeless tobacco history on file. She reports that she does not drink alcohol or use illicit drugs. 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 Single, 1 son age 82.  ALLERGIES: Meloxicam   MEDICATIONS:  Current Outpatient Prescriptions  Medication Sig Dispense Refill  . ARIPiprazole (ABILIFY) 5 MG tablet  Take 5 mg by mouth daily.      . cyclobenzaprine (FLEXERIL) 5 MG tablet       . esomeprazole (NEXIUM) 40 MG capsule Take 40 mg by mouth daily before breakfast.      . Fe  Fum-FePoly-Vit C-Vit B3 (INTEGRA) 62.5-62.5-40-3 MG CAPS Take 1 tablet by mouth daily.  30 capsule  2  . FeFum-FePoly-FA-B Cmp-C-Biot (INTEGRA PLUS) CAPS       . fish oil-omega-3 fatty acids 1000 MG capsule Take 2 g by mouth daily.      . folic acid (FOLVITE) 1 MG tablet Take 1 tablet (1 mg total) by mouth daily.  30 tablet  1  . hydrochlorothiazide (HYDRODIURIL) 25 MG tablet Take 25 mg by mouth daily.      Marland Kitchen lamoTRIgine (LAMICTAL) 25 MG tablet Take 25 mg by mouth daily.      . montelukast (SINGULAIR) 10 MG tablet Take 10 mg by mouth at bedtime.      . Multiple Vitamins-Minerals (MULTIVITAMIN WITH MINERALS) tablet Take 1 tablet by mouth daily.      . prochlorperazine (COMPAZINE) 10 MG tablet Take 1 tablet (10 mg total) by mouth every 6 (six) hours as needed.  30 tablet  1  . Vitamin D, Ergocalciferol, (DRISDOL) 50000 UNITS CAPS Take 50,000 Units by mouth every 7 (seven) days. On wednesdays      . dexamethasone (DECADRON) 4 MG tablet Take 1 tablet (4 mg total) by mouth as directed. 1 tab BID day before day of and day after chemo  40 tablet  1  . Venlafaxine HCl 150 MG TB24 Take 150 mg by mouth daily.      . VENTOLIN HFA 108 (90 BASE) MCG/ACT inhaler Inhale 2 puffs into the lungs every 4 (four) hours as needed. For wheezing         REVIEW OF SYSTEMS:  Pertinent items are noted in HPI.    PHYSICAL EXAM: Alert and oriented, in no respiratory distress.  weight is 236 lb 8 oz (107.276 kg). Her oral temperature is 98.6 F (37 C). Her blood pressure is 115/83 and her pulse is 110. Her respiration is 20 and oxygen saturation is 97%.   Head and neck examination: Grossly unremarkable. Nodes: Without palpable cervical or supraclavicular lymphadenopathy. Chest: Diminished breath sounds along the entire right hemithorax. Left lung is clear to auscultation. Heart: Regular in rhythm. Abdomen: Soft without masses organomegaly. Back: Without spinal or CVA tenderness. The pelvis there is no palpable pelvic  discomfort. Neurologic examination: Grossly nonfocal.    LABORATORY DATA:  Lab Results  Component Value Date   WBC 9.3 09/22/2011   HGB 7.9* 09/22/2011   HCT 25.2* 09/22/2011   MCV 83.4 09/22/2011   PLT 564* 09/22/2011   Lab Results  Component Value Date   NA 138 09/22/2011   K 4.5 09/22/2011   CL 102 09/22/2011   CO2 28 09/22/2011   Lab Results  Component Value Date   ALT <8 09/22/2011   AST 11 09/22/2011   ALKPHOS 102 09/22/2011   BILITOT 0.2* 09/22/2011      IMPRESSION:  stage IV adenocarcinoma of the right lung. She obviously needs systemic therapy. However, I feel that she would benefit from a 2-3 week course of radiation therapy directed to her right chest/mediastinum to control her bone disease. I explained to the patient that it was probably unlikely that she would reexpand her lung since it may have been collapse for some time. I discussed the potential acute  and late toxicities of radiation therapy she wishes to proceed as outlined. I will have her return tomorrow for simulation/treatment planning and I will try to get her treatment started with her tomorrow as well. Chemotherapy can begin at any time at the discretion of Dr. Arbutus Ped. Consent is signed today.   PLAN:  as discussed above.    I spent 60 minutes minutes face to face with the patient and more than 50% of that time was spent in counseling and/or coordination of care.

## 2011-09-28 NOTE — Progress Notes (Signed)
New consult Lung Cancer dx 09/13/11 Adenocarcinoma Single, 1 son, alert,oriented x3, staeady gait, dry cough, c/o right back thoracic pain  And front of chest, sob with ambulation, 97% room air sats 4:45 PM    Allergies:NKda Anxious to start chemotherapy

## 2011-09-28 NOTE — Progress Notes (Signed)
Please see the Nurse Progress Note in the MD Initial Consult Encounter for this patient. 

## 2011-09-29 ENCOUNTER — Encounter: Payer: Self-pay | Admitting: Radiation Oncology

## 2011-09-29 ENCOUNTER — Ambulatory Visit
Admission: RE | Admit: 2011-09-29 | Discharge: 2011-09-29 | Disposition: A | Discharge status: Home / Self Care | Payer: 59 | Source: Ambulatory Visit | Attending: Radiation Oncology | Admitting: Radiation Oncology

## 2011-09-29 DIAGNOSIS — C3490 Malignant neoplasm of unspecified part of unspecified bronchus or lung: Secondary | ICD-10-CM

## 2011-09-29 NOTE — Progress Notes (Signed)
Met with patient to discuss RO billing.  Dx: 162.9 Lung, NOS  Attending Rad: Dr. Chinita Greenland Tx: [phone] Extrl Beam

## 2011-09-29 NOTE — Progress Notes (Signed)
3-D simulation note:  The patient underwent 3-D simulation for treatment of her non-small cell carcinoma the right lung. She was set up conformally to her right lung PTV 2700 with AP and PA fields. I took a 1.0 cm expansion of her CTV 2700. 2 separate multileaf, designed to conform the field. I prescribing 2700 cGy in 15 sessions utilizing 15 MV photons. I requesting daily cone beam CT, setting up to her carina and spine in addition to her CTV. Dose volume histograms were obtained for the lungs, esophagus, spinal cord, heart, and targets.

## 2011-09-29 NOTE — Addendum Note (Signed)
Encounter addended by: Lowella Petties, RN on: 09/29/2011  7:19 AM<BR>     Documentation filed: Charges VN

## 2011-09-29 NOTE — Progress Notes (Signed)
Simulation/treatment planning note: The patient was taken to the CT simulator. She was placed on a wing board with her arms extended. Her chest was scanned. I please isocenter along the right mediastinum. I contoured her CTV 2700 presented her gross disease and extension and expanded this by 1.0 cm to create PTV 2700. I prescribing 2700 cGy in 15 sessions to her PTV 2700. This be given in conjunction with chemotherapy and we can expect increased toxicity, namely esophagitis. This represents a special treatment procedure. She'll have weekly blood counts. I requesting daily cone beam CT, setting up to her carina and spinal cord. She'll begin her radiation therapy later today. She is now ready for 3-D simulation.

## 2011-09-30 ENCOUNTER — Ambulatory Visit
Admission: RE | Admit: 2011-09-30 | Discharge: 2011-09-30 | Disposition: A | Discharge status: Home / Self Care | Payer: 59 | Source: Ambulatory Visit | Attending: Radiation Oncology | Admitting: Radiation Oncology

## 2011-09-30 ENCOUNTER — Telehealth: Payer: Self-pay | Admitting: Medical Oncology

## 2011-09-30 ENCOUNTER — Telehealth: Payer: Self-pay | Admitting: Family Medicine

## 2011-09-30 ENCOUNTER — Telehealth: Payer: Self-pay | Admitting: Internal Medicine

## 2011-09-30 ENCOUNTER — Encounter: Payer: Self-pay | Admitting: Radiation Oncology

## 2011-09-30 DIAGNOSIS — C3490 Malignant neoplasm of unspecified part of unspecified bronchus or lung: Secondary | ICD-10-CM

## 2011-09-30 MED ORDER — HYDROCODONE-ACETAMINOPHEN 5-500 MG PO CAPS: 1.0000 | ORAL_CAPSULE | Freq: Four times a day (QID) | ORAL | Status: AC | PRN

## 2011-09-30 NOTE — Progress Notes (Signed)
Weekly Management Note:  Site:R Lung Current Dose:  360  cGy Projected Dose: 2700  cGy  Narrative: The patient is seen today for routine under treatment assessment. CBCT/MVCT images/port films were reviewed. The chart was reviewed.   She seen today complaining of worsening right chest pain along with pain involving her right lower extremity. She denies low back pain. She denies lower stem the numbness or weakness. She is tried Aleve, Advil and Tylenol without much benefit.  Physical Examination: There were no vitals filed for this visit..  Weight:  . No change.  Impression: Tolerating radiation therapy well, however, she is more symptomatic from what I believe to be right chest pain from her primary carcinoma. I'll start her on hydrocodone/APAP (5/500) to take 1 by mouth every 6 hours when necessary.  Plan: Continue radiation therapy as planned.

## 2011-09-30 NOTE — Telephone Encounter (Signed)
Pt left voicemail message requesting a referral to see another oncologist as she is "not happy" with the one she is currently seeing.  Returned call to pt and left a message informing her that we received her message.  However, Dr. Caryl Never and Harriett Sine were out of the office until Monday and we would follow up with her request once they returned to the office.

## 2011-09-30 NOTE — Progress Notes (Signed)
Pt c/o "headaches, pain in my right chest and through to my back which is sometimes severe, has radiated down my right leg before". She states she has had this pain, but it has worsened in past few days. Pt states she has tried Tylenol, Aleve, Motrin with no relief except some relief of her headache. She states she is not sleeping well due to her pain. Pt requesting pain med. Dr Dayton Scrape in to see pt; Hydrocodone-APAPscript given to pt.  Post sim education completed w/pt; gave pt "Radiation and You" booklet w/pertinent pages marked, Radiaplex lotion w/instructions for proper use. Pt verbalized understanding. Informed pt she may see nurse at any time during her treatments for questions, concerns.

## 2011-09-30 NOTE — Telephone Encounter (Signed)
Pt called me back and was upset that "things were not moving fast enough . I am going to get another doctor" and hung up. I notified Corrie Dandy in radiation .

## 2011-09-30 NOTE — Telephone Encounter (Signed)
Pt called back and wants to know if special labs tests back and I told her they were not . Marland Kitchen She is also requesting some pain medicine for anterior chest pain. I consulted with Dr Donnald Garre and per Dr Donnald Garre pt is getting radiation for pain. I  left a message to discuss her pain with radiation nurse today .

## 2011-09-30 NOTE — Telephone Encounter (Signed)
caling about appointments for " head CT" and chemo appointments.-message forwarded to [facility]

## 2011-09-30 NOTE — Addendum Note (Signed)
Encounter addended by: Glennie Hawk, RN on: 09/30/2011  3:38 PM<BR>     Documentation filed: Notes Section

## 2011-09-30 NOTE — Telephone Encounter (Signed)
l/m with mri appt and mkm f;u

## 2011-09-30 NOTE — Progress Notes (Signed)
Informed pt she has nutrition appt scheduled on 10/07/11. Pt unsure if she'll keep this appt; informed her she may need nutrition consult due to radiation tx possibly causing swallowing difficulty and sore throat. Pt verbalized understanding.

## 2011-09-30 NOTE — Progress Notes (Signed)
Weekly Management Note: (Patient seen on first day, September 29, 2011)  Site:R Lung Current Dose:  180  cGy Projected Dose: 2700  cGy  Narrative: The patient is seen today for routine under treatment assessment. CBCT/MVCT images/port films were reviewed. The chart was reviewed.  "Mark and start today". No new complaints today.  Physical Examination: There were no vitals filed for this visit..  Weight:  . No change  Impression: Tolerating radiation therapy well.  Plan: Continue radiation therapy as planned.

## 2011-10-01 ENCOUNTER — Ambulatory Visit
Admission: RE | Admit: 2011-10-01 | Discharge: 2011-10-01 | Disposition: A | Discharge status: Home / Self Care | Payer: 59 | Source: Ambulatory Visit | Attending: Radiation Oncology | Admitting: Radiation Oncology

## 2011-10-01 ENCOUNTER — Telehealth: Payer: Self-pay | Admitting: Medical Oncology

## 2011-10-01 NOTE — Telephone Encounter (Signed)
I returned pts call and gave her appointment information for MRI

## 2011-10-03 NOTE — Telephone Encounter (Signed)
There is only one oncology group in [place].  Going outside of [place] would usually mean [place], [facility], or [place].  Let me know if she is interested.  However, she will likely get very similar care in any of these locations-including [place].  We have a very solid oncology group here.  It is probably not an option to see another oncologist in [place] as Dr Shirline Frees sees all of the lung cancer patients.

## 2011-10-04 ENCOUNTER — Emergency Department (HOSPITAL_COMMUNITY)
Admission: EM | Admit: 2011-10-04 | Discharge: 2011-10-04 | Disposition: A | Discharge status: Home / Self Care | Payer: 59 | Attending: Emergency Medicine | Admitting: Emergency Medicine

## 2011-10-04 ENCOUNTER — Emergency Department (HOSPITAL_COMMUNITY): Payer: 59

## 2011-10-04 ENCOUNTER — Ambulatory Visit: Payer: 59

## 2011-10-04 ENCOUNTER — Telehealth: Payer: Self-pay | Admitting: Internal Medicine

## 2011-10-04 ENCOUNTER — Encounter: Payer: Self-pay | Admitting: *Deleted

## 2011-10-04 ENCOUNTER — Telehealth: Payer: Self-pay | Admitting: *Deleted

## 2011-10-04 ENCOUNTER — Encounter (HOSPITAL_COMMUNITY): Payer: Self-pay | Admitting: *Deleted

## 2011-10-04 ENCOUNTER — Other Ambulatory Visit: Payer: 59 | Admitting: Lab

## 2011-10-04 ENCOUNTER — Ambulatory Visit: Payer: 59 | Admitting: Internal Medicine

## 2011-10-04 DIAGNOSIS — J45909 Unspecified asthma, uncomplicated: Secondary | ICD-10-CM | POA: Insufficient documentation

## 2011-10-04 DIAGNOSIS — J90 Pleural effusion, not elsewhere classified: Secondary | ICD-10-CM | POA: Insufficient documentation

## 2011-10-04 DIAGNOSIS — IMO0001 Reserved for inherently not codable concepts without codable children: Secondary | ICD-10-CM | POA: Insufficient documentation

## 2011-10-04 DIAGNOSIS — Z87891 Personal history of nicotine dependence: Secondary | ICD-10-CM | POA: Insufficient documentation

## 2011-10-04 DIAGNOSIS — I10 Essential (primary) hypertension: Secondary | ICD-10-CM | POA: Insufficient documentation

## 2011-10-04 DIAGNOSIS — F341 Dysthymic disorder: Secondary | ICD-10-CM | POA: Insufficient documentation

## 2011-10-04 DIAGNOSIS — K219 Gastro-esophageal reflux disease without esophagitis: Secondary | ICD-10-CM | POA: Insufficient documentation

## 2011-10-04 DIAGNOSIS — Z85118 Personal history of other malignant neoplasm of bronchus and lung: Secondary | ICD-10-CM | POA: Insufficient documentation

## 2011-10-04 LAB — CARDIAC PANEL(CRET KIN+CKTOT+MB+TROPI)
Relative Index: INVALID (ref 0.0–2.5)
Troponin I: <0.3 ng/mL (ref <0.30)

## 2011-10-04 LAB — CBC WITH DIFFERENTIAL/PLATELET
Basophils Relative: 0 % (ref 0–1)
HCT: 26.7 % — ABNORMAL LOW (ref 36.0–46.0)
Hemoglobin: 8.2 g/dL — ABNORMAL LOW (ref 12.0–15.0)
Lymphs Abs: 1.7 10*3/uL (ref 0.7–4.0)
MCHC: 30.7 g/dL (ref 30.0–36.0)
Monocytes Absolute: 0.8 10*3/uL (ref 0.1–1.0)
Monocytes Relative: 8 % (ref 3–12)
Neutro Abs: 7.4 10*3/uL (ref 1.7–7.7)
Neutrophils Relative %: 70 % (ref 43–77)
RBC: 3.22 MIL/uL — ABNORMAL LOW (ref 3.87–5.11)

## 2011-10-04 LAB — BASIC METABOLIC PANEL
BUN: 12 mg/dL (ref 6–23)
CO2: 26 mEq/L (ref 19–32)
Chloride: 94 mEq/L — ABNORMAL LOW (ref 96–112)
Creatinine, Ser: 0.56 mg/dL (ref 0.50–1.10)
Glucose, Bld: 120 mg/dL — ABNORMAL HIGH (ref 70–99)
Potassium: 3.4 mEq/L — ABNORMAL LOW (ref 3.5–5.1)

## 2011-10-04 MED ORDER — OXYCODONE-ACETAMINOPHEN 5-325 MG PO TABS: 2.0000 | ORAL_TABLET | ORAL | Status: DC | PRN

## 2011-10-04 MED ORDER — IOHEXOL 350 MG/ML SOLN
100.0000 mL | Freq: Once | INTRAVENOUS | Status: AC | PRN
  Administered 2011-10-04: 100 mL via INTRAVENOUS

## 2011-10-04 MED ORDER — MORPHINE SULFATE 4 MG/ML IJ SOLN
4.0000 mg | Freq: Once | INTRAMUSCULAR | Status: AC
  Administered 2011-10-04: 4 mg via INTRAVENOUS
  Filled 2011-10-04: qty 1

## 2011-10-04 MED ORDER — ONDANSETRON HCL 4 MG/2ML IJ SOLN
4.0000 mg | Freq: Once | INTRAMUSCULAR | Status: AC
  Administered 2011-10-04: 4 mg via INTRAVENOUS
  Filled 2011-10-04: qty 2

## 2011-10-04 NOTE — Telephone Encounter (Signed)
pt called and l/m to make an appt,per sj she had spoken/pt l/m for her as well,go ahead and make an appt,called pt with appt   aom

## 2011-10-04 NOTE — ED Provider Notes (Signed)
History     CSN: [id number]  Arrival date & time 10/04/11  6644   First MD Initiated Contact with Patient 10/04/11 [phone]      Chief Complaint  Patient presents with  . Chest Pain    pain in right lung; worse with breathing    (Consider location/radiation/quality/duration/timing/severity/associated sxs/prior treatment) HPI Comments: Patient presents with complaints of right-sided sharp chest pain. This been gone on for several months but it's been worse over the last 4-5 days. She states it does hurt when she agrees. She is feeling a little bit more short of breath than normal. She has a history of a right lung cancer mass which is being treated with radiation therapy. She is not receiving chemotherapy and she's not had surgery, mass. She's been followed by Dr. Gwenyth Bouillon. She does feel a she's had some low-grade fevers over last couple days. She's had a cough which has been slightly productive. She denies any leg pain or swelling. Denies a history of DVTs in the past. She's been using some Tylenol for the pain with no improvement.  Patient is a 51 y.o. female presenting with chest pain. The history is provided by the patient.  Chest Pain The chest pain began 3 - 5 days ago. Primary symptoms include a fever, fatigue, shortness of breath and cough. Pertinent negatives for primary symptoms include no abdominal pain, no nausea, no vomiting and no dizziness.  Pertinent negatives for associated symptoms include no diaphoresis, no numbness and no weakness.     Past Medical History  Diagnosis Date  . Asthma   . GERD (gastroesophageal reflux disease)   . Hypertension   . Heart murmur   . Fibromyalgia   . Arthritis of knee, degenerative   . Endocarditis     as teenager  . Complication of anesthesia     difficulty waking up  . Shortness of breath   . Lung mass     R- ADENOCARCINOMA  . Sleep apnea     no longer using CPAP  . Pleural effusion 08/30/11  . Osseous metastasis 09/20/11    per  PET scan  . Anxiety   . Depression     Past Surgical History  Procedure Date  . Tubal ligation 2002  . Thoracentesis 09/02/11, 09/09/11    right-side pleural effusion    Family History  Problem Relation Age of Onset  . Hypertension Mother   . Alcohol abuse Father   . Hypertension Maternal Aunt   . Hypertension Maternal Uncle   . Hypertension Maternal Grandmother   . Hypertension Maternal Grandfather   . Sarcoidosis Sister     History  Substance Use Topics  . Smoking status: Former Smoker -- 0.5 packs/day for 15 years    Types: Cigarettes    Quit date: 01/01/2011  . Smokeless tobacco: Not on file  . Alcohol Use: No     per H&P, used to drink alcohol regularly    OB History    Grav Para Term Preterm Abortions TAB SAB Ect Mult Living                  Review of Systems  Constitutional: Positive for fever and fatigue. Negative for chills and diaphoresis.  HENT: Negative for congestion, rhinorrhea and sneezing.   Eyes: Negative.   Respiratory: Positive for cough and shortness of breath. Negative for chest tightness.   Cardiovascular: Positive for chest pain. Negative for leg swelling.  Gastrointestinal: Negative for nausea, vomiting, abdominal pain, diarrhea and blood  in stool.  Genitourinary: Negative for frequency, hematuria, flank pain and difficulty urinating.  Musculoskeletal: Negative for back pain and arthralgias.  Skin: Negative for rash.  Neurological: Negative for dizziness, speech difficulty, weakness, numbness and headaches.    Allergies  Meloxicam  Home Medications   Current Outpatient Rx  Name Route Sig Dispense Refill  . ARIPIPRAZOLE 5 MG PO TABS Oral Take 5 mg by mouth daily.    . CYCLOBENZAPRINE HCL 5 MG PO TABS      . ESOMEPRAZOLE MAGNESIUM 40 MG PO CPDR Oral Take 40 mg by mouth daily before breakfast.    . INTEGRA 62.5-62.5-40-3 MG PO CAPS Oral Take 1 tablet by mouth daily.    . OMEGA-3 FATTY ACIDS 1000 MG PO CAPS Oral Take 2 g by mouth daily.     Marland Kitchen FOLIC ACID 1 MG PO TABS Oral Take 1 tablet (1 mg total) by mouth daily. 30 tablet 1  . HYDROCHLOROTHIAZIDE 25 MG PO TABS Oral Take 25 mg by mouth daily.    Marland Kitchen HYDROCODONE-ACETAMINOPHEN 5-500 MG PO CAPS Oral Take 1 capsule by mouth every 6 (six) hours as needed for pain. 30 capsule 1  . LAMOTRIGINE 25 MG PO TABS Oral Take 25 mg by mouth daily.    . MULTI-VITAMIN/MINERALS PO TABS Oral Take 1 tablet by mouth daily.    . VENLAFAXINE HCL ER 150 MG PO TB24 Oral Take 150 mg by mouth daily.    Marland Kitchen VITAMIN D (ERGOCALCIFEROL) 50000 UNITS PO CAPS Oral Take 50,000 Units by mouth every 7 (seven) days. On wednesdays    . OXYCODONE-ACETAMINOPHEN 5-325 MG PO TABS Oral Take 2 tablets by mouth every 4 (four) hours as needed for pain. 15 tablet 0  . PROCHLORPERAZINE MALEATE 10 MG PO TABS Oral Take 1 tablet (10 mg total) by mouth every 6 (six) hours as needed. 30 tablet 1    BP 97/63  Pulse 120  Temp 98.7 F (37.1 C) (Oral)  Resp 18  Wt 233 lb (105.688 kg)  SpO2 94%  LMP 09/28/2011  Physical Exam  Constitutional: She is oriented to person, place, and time. She appears well-developed and well-nourished.  HENT:  Head: Normocephalic and atraumatic.  Mouth/Throat: Oropharynx is clear and moist.  Eyes: Pupils are equal, round, and reactive to light.  Neck: Normal range of motion. Neck supple.  Cardiovascular: Normal rate, regular rhythm and normal heart sounds.   Pulmonary/Chest: Effort normal and breath sounds normal. No respiratory distress. She has no wheezes. She has no rales. She exhibits no tenderness.       Decreased breath sounds on the right as compared to the left  Abdominal: Soft. Bowel sounds are normal. There is no tenderness. There is no rebound and no guarding.  Musculoskeletal: Normal range of motion. She exhibits no edema.  Lymphadenopathy:    She has no cervical adenopathy.  Neurological: She is alert and oriented to person, place, and time.  Skin: Skin is warm and dry. No rash noted.    Psychiatric: She has a normal mood and affect.    ED Course  Procedures (including critical care time)  Results for orders placed during the hospital encounter of 10/04/11  CBC WITH DIFFERENTIAL      Component Value Range   WBC 10.5  4.0 - 10.5 K/uL   RBC 3.22 (*) 3.87 - 5.11 MIL/uL   Hemoglobin 8.2 (*) 12.0 - 15.0 g/dL   HCT 81.1 (*) 91.4 - 78.2 %   MCV 82.9  78.0 -  100.0 fL   MCH 25.5 (*) 26.0 - 34.0 pg   MCHC 30.7  30.0 - 36.0 g/dL   RDW 21.3 (*) 08.6 - 57.8 %   Platelets 547 (*) 150 - 400 K/uL   Neutrophils Relative 70  43 - 77 %   Neutro Abs 7.4  1.7 - 7.7 K/uL   Lymphocytes Relative 16  12 - 46 %   Lymphs Abs 1.7  0.7 - 4.0 K/uL   Monocytes Relative 8  3 - 12 %   Monocytes Absolute 0.8  0.1 - 1.0 K/uL   Eosinophils Relative 6 (*) 0 - 5 %   Eosinophils Absolute 0.7  0.0 - 0.7 K/uL   Basophils Relative 0  0 - 1 %   Basophils Absolute 0.0  0.0 - 0.1 K/uL  BASIC METABOLIC PANEL      Component Value Range   Sodium 131 (*) 135 - 145 mEq/L   Potassium 3.4 (*) 3.5 - 5.1 mEq/L   Chloride 94 (*) 96 - 112 mEq/L   CO2 26  19 - 32 mEq/L   Glucose, Bld 120 (*) 70 - 99 mg/dL   BUN 12  6 - 23 mg/dL   Creatinine, Ser 4.69  0.50 - 1.10 mg/dL   Calcium 9.0  8.4 - 62.9 mg/dL   GFR calc non Af Amer >90  >90 mL/min   GFR calc Af Amer >90  >90 mL/min  CARDIAC PANEL(CRET KIN+CKTOT+MB+TROPI)      Component Value Range   Total CK 24  7 - 177 U/L   CK, MB 1.0  0.3 - 4.0 ng/mL   Troponin I <0.30  <0.30 ng/mL   Relative Index RELATIVE INDEX IS INVALID  0.0 - 2.5    Date: 10/04/2011  Rate: 96  Rhythm: normal sinus rhythm  QRS Axis: left  Intervals: normal  ST/T Wave abnormalities: nonspecific ST/T changes  Conduction Disutrbances:none  Narrative Interpretation:   Old EKG Reviewed: unchanged  Results for orders placed during the hospital encounter of 10/04/11  CBC WITH DIFFERENTIAL      Component Value Range   WBC 10.5  4.0 - 10.5 K/uL   RBC 3.22 (*) 3.87 - 5.11 MIL/uL    Hemoglobin 8.2 (*) 12.0 - 15.0 g/dL   HCT 52.8 (*) 41.3 - 24.4 %   MCV 82.9  78.0 - 100.0 fL   MCH 25.5 (*) 26.0 - 34.0 pg   MCHC 30.7  30.0 - 36.0 g/dL   RDW 01.0 (*) 27.2 - 53.6 %   Platelets 547 (*) 150 - 400 K/uL   Neutrophils Relative 70  43 - 77 %   Neutro Abs 7.4  1.7 - 7.7 K/uL   Lymphocytes Relative 16  12 - 46 %   Lymphs Abs 1.7  0.7 - 4.0 K/uL   Monocytes Relative 8  3 - 12 %   Monocytes Absolute 0.8  0.1 - 1.0 K/uL   Eosinophils Relative 6 (*) 0 - 5 %   Eosinophils Absolute 0.7  0.0 - 0.7 K/uL   Basophils Relative 0  0 - 1 %   Basophils Absolute 0.0  0.0 - 0.1 K/uL  BASIC METABOLIC PANEL      Component Value Range   Sodium 131 (*) 135 - 145 mEq/L   Potassium 3.4 (*) 3.5 - 5.1 mEq/L   Chloride 94 (*) 96 - 112 mEq/L   CO2 26  19 - 32 mEq/L   Glucose, Bld 120 (*) 70 - 99 mg/dL  BUN 12  6 - 23 mg/dL   Creatinine, Ser 4.25  0.50 - 1.10 mg/dL   Calcium 9.0  8.4 - 95.6 mg/dL   GFR calc non Af Amer >90  >90 mL/min   GFR calc Af Amer >90  >90 mL/min  CARDIAC PANEL(CRET KIN+CKTOT+MB+TROPI)      Component Value Range   Total CK 24  7 - 177 U/L   CK, MB 1.0  0.3 - 4.0 ng/mL   Troponin I <0.30  <0.30 ng/mL   Relative Index RELATIVE INDEX IS INVALID  0.0 - 2.5   Dg Chest 1 View  09/09/2011  *RADIOLOGY REPORT*  Clinical Data: Post right thoracentesis.  CHEST - 1 VIEW  Comparison: 09/08/2011  Findings: Continued near complete opacification of the right hemithorax.  Stable appearance.  No pneumothorax following thoracentesis.  Left lung is clear.  IMPRESSION: Stable appearance.  No pneumothorax.  Original Report Authenticated By: Cyndie Chime, M.D.   Dg Chest 2 View  09/13/2011  *RADIOLOGY REPORT*  Clinical Data: Cardiac and pulmonary history, asthma, hypertension, bronchoscopy  CHEST - 2 VIEW  Comparison: 09/09/2011  Findings: Persistent sub total opacification of right hemithorax by large right pleural effusion and pulmonary atelectasis. Underlying infiltrate and tumor not  excluded. Upper normal heart size. Pulmonary vascular congestion. Question minimal left upper lobe infiltrate. Remaining left lung clear. No pneumothorax. Sclerosis at proximal right humerus question prior infarct.  IMPRESSION: No interval change.  Original Report Authenticated By: Lollie Marrow, M.D.   Dg Chest 2 View  09/08/2011  *RADIOLOGY REPORT*  Clinical Data: Pleural effusion.  CHEST - 2 VIEW  Comparison: CT chest 08/30/2011 and plain film chest 09/02/2011  Findings: Near complete whiteout of the right hemithorax is again seen.  Small focus of aerated lung is seen in the right mid chest. Left lung remains clear.  Heart size is upper normal.  Multifocal sclerotic lesions consistent with metastatic disease is again identified.  IMPRESSION:  1.  No change in near complete whiteout of the right chest consistent with large effusion and extensive airspace disease. 2.  Multifocal osseous metastases.  Original Report Authenticated By: Bernadene Bell. Maricela Curet, M.D.   Ct Angio Chest W/cm &/or Wo Cm  10/04/2011  *RADIOLOGY REPORT*  Clinical Data: Lung cancer with chest pain and shortness of breath. Evaluate for pulmonary embolus versus pleural effusion.  CT ANGIOGRAPHY CHEST  Technique:  Multidetector CT imaging of the chest using the standard protocol during bolus administration of intravenous contrast. Multiplanar reconstructed images including MIPs were obtained and reviewed to evaluate the vascular anatomy.  Contrast: OMNIPAQUE IOHEXOL 350 MG/ML SOLN  Comparison: PET CT 09/20/2011 and CT chest 08/30/2011.  Findings: No definite pulmonary embolus.  Mediastinal adenopathy measures up to 1.7 cm in the AP window (previously 1.3 cm).  There is a centrally obstructing ill-defined mass in the right lung measuring approximately 9.2 x 6.6 cm (previously 8.8 x 6.1 cm).  A large complex collection of pleural fluid occupies the right hemithorax, with a thick rind of soft tissue. Overall size has decreased slightly from  08/30/2011.  A single locule of air is seen posteriorly within the right pleural space, which may be due to intervening thoracentesis. Pulmonary arteries are enlarged.  Heart is at the upper limits of normal in size.  No pericardial effusion.  Left lower lobe nodule measures 9 mm (previously 7 mm).  Added density in the left lung is due in part to expiratory phase imaging.  There are some areas  of more confluent ground-glass in the left upper lobe.  No left pleural fluid.  Right upper and right lower lobe bronchi are obstructed.  Airway is otherwise unremarkable.  Incidental imaging of the upper abdomen shows no acute findings. Retrocrural adenopathy measures up to 10 mm.  Sclerotic lesions are seen throughout the visualized osseous structures.  IMPRESSION:  1.  No definite pulmonary embolus. 2.  Slight progression of stage IV primary bronchogenic carcinoma, when compared baseline examination of 08/30/2011. 3.  Slight interval decrease in size of a presumably malignant right pleural effusion after intervening thoracentesis.  Original Report Authenticated By: Reyes Ivan, M.D.   Nm Pet Image Initial (pi) Skull Base To Thigh  09/20/2011  *RADIOLOGY REPORT*  Clinical Data: Initial treatment strategy for lung cancer.  NUCLEAR MEDICINE PET SKULL BASE TO THIGH  Fasting Blood Glucose:  86  Technique:  19.6 mCi F-18 FDG was injected intravenously. CT data was obtained and used for attenuation correction and anatomic localization only.  (This was not acquired as a diagnostic CT examination.) Additional exam technical data entered on technologist worksheet.  Comparison:  CT chest dated 08/30/2011  Findings:  Neck: No hypermetabolic lymph nodes in the neck.  Chest:  Near complete collapse of the right lung with associated central obstructing mass/hypermetabolism, max SUV 7.4, corresponding to known lung adenocarcinoma.  8 and 12 mm left lower lobe pulmonary nodules (series 2/images 110 and 117), without convincing  hypermetabolism.  1.9 cm right prevascular node (series 2/image 92), max SUV 17.3. Additional right paratracheal and subcarinal nodal metastases, max SUV 6.9 (PET image 97).  Large right pleural effusion with associated pleural thickening and internal debris (series 2/image 132), suggesting malignant effusion.  Foci of gas posteriorly (series 2/image 101), possibly related to prior thoracentesis, although not nondependent.  Abdomen/Pelvis:  No abnormal hypermetabolic activity within the liver, pancreas, adrenal glands, or spleen.  Malrotated right kidney.  Mildly heterogeneous uterus, possibly reflecting uterine fibroids.  Small fat-containing left inguinal hernia.  No hypermetabolic lymph nodes in the abdomen or pelvis.  [place]:  Diffuse sclerotic osseous metastases throughout the visualized axial and appendicular skeleton.  Associated hypermetabolism in the right sternum, max SUV 7.4 (PET image 82).  IMPRESSION: Central obstructing mass in the right lung with near complete collapse and associated hypermetabolism, max SUV 7.4, corresponding to known lung adenocarcinoma.  Right prevascular, paratracheal, and subcarinal nodal metastases, as described above, max SUV 17.3.  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  Diffuse sclerotic osseous metastases throughout the visualized axial and appendicular skeleton, max SUV 7.4.  Original Report Authenticated By: Charline Bills, M.D.   US Thoracentesis Asp Pleural Space W/img Guide  09/09/2011  *RADIOLOGY REPORT*  Clinical Data:  Right-sided hilar mass, right-sided pleural effusion  ULTRASOUND GUIDED right THORACENTESIS  Comparison:  Previous thoracentesis on 09/02/11  An ultrasound guided thoracentesis was thoroughly discussed with the patient and questions answered.  The benefits, risks, alternatives and complications were also discussed.  The patient understands and wishes to proceed with the procedure.  Written consent was obtained.  Ultrasound was performed  to localize and mark an adequate pocket of fluid in the right chest.  There were numerous loculations seen and thickened pleura. The lung was also densely consolidated as well. The largest of the collections was marked for aspiration. The area was then prepped and draped in the normal sterile fashion.  1% Lidocaine was used for local anesthesia.  Under ultrasound guidance a 19 gauge Yueh catheter was introduced.  Thoracentesis was performed,  but yielded only approximately 25cc of blood tinged fluid. The catheter was removed and another thorough US exam was done by Dr. Fredia Sorrow but another suitable location for thoracentesis was not found. A dressing was placed.  Complications:  None  Findings: A total of approximately 25 ml of blood tinged fluid was removed. A fluid sample was sent for laboratory analysis.  IMPRESSION: Successful ultrasound guided right thoracentesis yielding 25 ml of pleural fluid.  The pt states she is scheduled for bronchoscopy which is likely to yield better results for tissue diagnosis.  Procedure completed with Dr. Fredia Sorrow.  Read by Brayton El PA-C  Original Report Authenticated By: Reola Calkins, M.D.       Ct Angio Chest W/cm &/or Wo Cm  10/04/2011  *RADIOLOGY REPORT*  Clinical Data: Lung cancer with chest pain and shortness of breath. Evaluate for pulmonary embolus versus pleural effusion.  CT ANGIOGRAPHY CHEST  Technique:  Multidetector CT imaging of the chest using the standard protocol during bolus administration of intravenous contrast. Multiplanar reconstructed images including MIPs were obtained and reviewed to evaluate the vascular anatomy.  Contrast: OMNIPAQUE IOHEXOL 350 MG/ML SOLN  Comparison: PET CT 09/20/2011 and CT chest 08/30/2011.  Findings: No definite pulmonary embolus.  Mediastinal adenopathy measures up to 1.7 cm in the AP window (previously 1.3 cm).  There is a centrally obstructing ill-defined mass in the right lung measuring approximately 9.2 x 6.6  cm (previously 8.8 x 6.1 cm).  A large complex collection of pleural fluid occupies the right hemithorax, with a thick rind of soft tissue. Overall size has decreased slightly from 08/30/2011.  A single locule of air is seen posteriorly within the right pleural space, which may be due to intervening thoracentesis. Pulmonary arteries are enlarged.  Heart is at the upper limits of normal in size.  No pericardial effusion.  Left lower lobe nodule measures 9 mm (previously 7 mm).  Added density in the left lung is due in part to expiratory phase imaging.  There are some areas of more confluent ground-glass in the left upper lobe.  No left pleural fluid.  Right upper and right lower lobe bronchi are obstructed.  Airway is otherwise unremarkable.  Incidental imaging of the upper abdomen shows no acute findings. Retrocrural adenopathy measures up to 10 mm.  Sclerotic lesions are seen throughout the visualized osseous structures.  IMPRESSION:  1.  No definite pulmonary embolus. 2.  Slight progression of stage IV primary bronchogenic carcinoma, when compared baseline examination of 08/30/2011. 3.  Slight interval decrease in size of a presumably malignant right pleural effusion after intervening thoracentesis.  Original Report Authenticated By: Reyes Ivan, M.D.     1. Pleural effusion       MDM  Patient with no evidence of pulmonary embolus or pneumonia on the CT scan. There is some expansion of her bronchogenic carcinoma. There is a large right pleural effusion which is noted on past x-rays it seems to be improved since her last thoracentesis. I discussed findings with Dr. Arbutus Ped and he suggested pain medicine and and they will follow her up as an outpatient. She had been taking Vicodin which she did not feel was helping her pain very well.  Will switch her to oxycodone and have her follow Dr. Curt Bears, MD 10/04/11 1058

## 2011-10-04 NOTE — Telephone Encounter (Signed)
Ok to provide letter that pt has stage 4 Adenocarcinoma of lung with complete consolidation and collapse of right lung and multiple metastases.  She has severe dyspnea and will be permanently disabled.

## 2011-10-04 NOTE — Telephone Encounter (Signed)
Called pt to check on status since her ED visit this morning. No answer; left vm w/callback #.

## 2011-10-04 NOTE — Telephone Encounter (Signed)
Letter faxed, confirmation received, pt informed 

## 2011-10-04 NOTE — Telephone Encounter (Signed)
Pt informed and voiced her frustration, but understood.  She will think about her options and let us know how we can assist.

## 2011-10-04 NOTE — ED Notes (Signed)
Pt is angry, left message on MD's phone stating "you know what this is, I don't appreciate you blowing me off, if you don't want to tx me anymore"; pt states "it's my lung cancer, hurts worse with breathing"; spoke to MD on phone, wanting to r/o PE or pleural effusion.

## 2011-10-05 ENCOUNTER — Encounter: Payer: Self-pay | Admitting: Internal Medicine

## 2011-10-05 ENCOUNTER — Telehealth: Payer: Self-pay | Admitting: Family Medicine

## 2011-10-05 ENCOUNTER — Telehealth: Payer: Self-pay | Admitting: *Deleted

## 2011-10-05 ENCOUNTER — Encounter: Payer: Self-pay | Admitting: *Deleted

## 2011-10-05 ENCOUNTER — Other Ambulatory Visit: Payer: Self-pay | Admitting: Radiation Oncology

## 2011-10-05 ENCOUNTER — Ambulatory Visit: Payer: 59

## 2011-10-05 DIAGNOSIS — C3490 Malignant neoplasm of unspecified part of unspecified bronchus or lung: Secondary | ICD-10-CM | POA: Insufficient documentation

## 2011-10-05 MED ORDER — MORPHINE SULFATE ER 15 MG PO TBCR: 15.0000 mg | EXTENDED_RELEASE_TABLET | Freq: Two times a day (BID) | ORAL | Status: DC

## 2011-10-05 MED ORDER — MORPHINE SULFATE 15 MG PO TABS: 15.0000 mg | ORAL_TABLET | ORAL | Status: DC | PRN

## 2011-10-05 MED ORDER — DIPHENHYDRAMINE HCL 25 MG PO CAPS: ORAL_CAPSULE | ORAL | Status: DC

## 2011-10-05 NOTE — Telephone Encounter (Signed)
Called pt who left vm re: pain medications. Pt states she only has 2 Oxycodone 5-325 mg tabs left, requests a refill and increase in dose. Pt received 15 tabs from ED yesterday to take 2 tabs q4 hrs prn pain. Pt states right chest pain was 10/10, states w/med her pain decreased to 5. She states she got "good relief yesterday but has not had as much relief of pain today".  Discussed w/Dr Michell Heinrich who prescribed MS Contin, MSIR for insurance coverage. Called pt to inform; advised she will have to see dr before she receives scripts. Also educated pt on need for OTC to prevent constipation. Advised she take stool softeners nightly plus Miralax or similar laxative.  Pt also aware that due to [facility] issues, she may not receive radiation tx today. If issues resolved she will have to come to cancer center later today for treatment, requiring another trip. Pt verbalized understanding, agreement of all above.

## 2011-10-05 NOTE — Progress Notes (Signed)
Put [employer] disability forms on nurse's desk.

## 2011-10-05 NOTE — Progress Notes (Signed)
Clinical Social Work received referral from Colen Darling, RadOnc RN, for psychosocial assessment.  CSW spoke briefly with [name], however, [name] requested to speak at a later time or meet at [facility] after her radtx.  CSW will follow up with the patient tomorrow or after radtx on Thursday.   Kathrin Penner, MSW, [facility] Clinical Social Worker [facility] [phone]

## 2011-10-05 NOTE — Telephone Encounter (Signed)
She can take benadryl 25 mg 1-2 po q 6 hours prn allergies #120

## 2011-10-05 NOTE — Progress Notes (Signed)
Pt in nursing to pick up scripts; seen by Dr Michell Heinrich. Pt can be tx this morning, but states she wants to come back this afternoon. [facility] RT will call pt w/appt time. Talked w/pt re: seeing L Mullis, SW. Pt states she wishes to speak w/SW re: transportation needs. Left vm for L Mullis.

## 2011-10-05 NOTE — Telephone Encounter (Signed)
Pt called - said to disregard the earlier message about pain meds - it's been taken care of. She would like a Rx of Benadryl called in ([facility]). Realizes it is OTC, but if RX, can pay with her [employer] card. Thanks.

## 2011-10-05 NOTE — Progress Notes (Signed)
[employer] mgmt services form (physician statement) placed in Beth [name]'s inbox in medical mgmt.  SLJ

## 2011-10-06 ENCOUNTER — Telehealth: Payer: Self-pay | Admitting: *Deleted

## 2011-10-06 ENCOUNTER — Ambulatory Visit: Payer: 59

## 2011-10-06 ENCOUNTER — Telehealth: Payer: Self-pay | Admitting: Internal Medicine

## 2011-10-06 NOTE — Telephone Encounter (Signed)
Reached pt on cell phone. She states she cancelled her radiation appt this morning because her "pain was too bad". Pt states yesterday between 3:30 pm - 8 pm she had difficulty getting her pain under control. She states that she is taking her MS Contin q 12 hr, MSIR q 4 hrs, currently rates her right chest pain at 5/10. She states this med regime is controlling her pain better than other pain meds. Pt also states that she prefers tx in the afternoon, "it is easier for me". Her schedule for radiation tx is back to 2:45 pm for the remainder of her tx. Pt states she "will come in for treatment tomorrow".

## 2011-10-06 NOTE — Telephone Encounter (Signed)
Per RT note, pt called and cancelled her radiation tx appt today. Called pt to discuss; left vm w/callback #.

## 2011-10-06 NOTE — Telephone Encounter (Signed)
pt l/m to verify her 7/19 mri,called and l/m with info    aom

## 2011-10-07 ENCOUNTER — Other Ambulatory Visit (HOSPITAL_BASED_OUTPATIENT_CLINIC_OR_DEPARTMENT_OTHER): Payer: 59 | Admitting: Lab

## 2011-10-07 ENCOUNTER — Encounter: Payer: Self-pay | Admitting: Internal Medicine

## 2011-10-07 ENCOUNTER — Encounter: Payer: Self-pay | Admitting: Nutrition

## 2011-10-07 ENCOUNTER — Ambulatory Visit (HOSPITAL_BASED_OUTPATIENT_CLINIC_OR_DEPARTMENT_OTHER): Payer: 59 | Admitting: Internal Medicine

## 2011-10-07 ENCOUNTER — Telehealth: Payer: Self-pay | Admitting: *Deleted

## 2011-10-07 ENCOUNTER — Encounter: Payer: 59 | Admitting: Nutrition

## 2011-10-07 ENCOUNTER — Encounter: Payer: Self-pay | Admitting: *Deleted

## 2011-10-07 ENCOUNTER — Telehealth: Payer: Self-pay | Admitting: Internal Medicine

## 2011-10-07 ENCOUNTER — Other Ambulatory Visit: Payer: Self-pay | Admitting: *Deleted

## 2011-10-07 ENCOUNTER — Other Ambulatory Visit (HOSPITAL_COMMUNITY): Payer: 59

## 2011-10-07 ENCOUNTER — Ambulatory Visit: Payer: 59

## 2011-10-07 VITALS — BP 97/67 | HR 131 | Temp 98.4°F | Ht 65.0 in | Wt 233.3 lb

## 2011-10-07 DIAGNOSIS — R0602 Shortness of breath: Secondary | ICD-10-CM

## 2011-10-07 DIAGNOSIS — C3490 Malignant neoplasm of unspecified part of unspecified bronchus or lung: Secondary | ICD-10-CM

## 2011-10-07 DIAGNOSIS — C3400 Malignant neoplasm of unspecified main bronchus: Secondary | ICD-10-CM

## 2011-10-07 DIAGNOSIS — C7951 Secondary malignant neoplasm of bone: Secondary | ICD-10-CM

## 2011-10-07 LAB — CBC WITH DIFFERENTIAL/PLATELET
Eosinophils Absolute: 0.1 10*3/uL (ref 0.0–0.5)
HCT: 25.2 % — ABNORMAL LOW (ref 34.8–46.6)
LYMPH%: 12.6 % — ABNORMAL LOW (ref 14.0–49.7)
MONO#: 1.3 10*3/uL — ABNORMAL HIGH (ref 0.1–0.9)
NEUT#: 11.1 10*3/uL — ABNORMAL HIGH (ref 1.5–6.5)
NEUT%: 77.3 % — ABNORMAL HIGH (ref 38.4–76.8)
Platelets: 552 10*3/uL — ABNORMAL HIGH (ref 145–400)
RBC: 3.04 10*6/uL — ABNORMAL LOW (ref 3.70–5.45)
WBC: 14.3 10*3/uL — ABNORMAL HIGH (ref 3.9–10.3)
lymph#: 1.8 10*3/uL (ref 0.9–3.3)

## 2011-10-07 LAB — COMPREHENSIVE METABOLIC PANEL
ALT: <8 U/L (ref 0–35)
CO2: 24 mEq/L (ref 19–32)
Calcium: 8.5 mg/dL (ref 8.4–10.5)
Chloride: 92 mEq/L — ABNORMAL LOW (ref 96–112)
Glucose, Bld: 142 mg/dL — ABNORMAL HIGH (ref 70–99)
Sodium: 131 mEq/L — ABNORMAL LOW (ref 135–145)
Total Bilirubin: 0.4 mg/dL (ref 0.3–1.2)
Total Protein: 7.5 g/dL (ref 6.0–8.3)

## 2011-10-07 LAB — TECHNOLOGIST REVIEW: Technologist Review: 3

## 2011-10-07 MED ORDER — ERLOTINIB HCL 150 MG PO TABS: 150.0000 mg | ORAL_TABLET | Freq: Every day | ORAL | Status: DC

## 2011-10-07 NOTE — Progress Notes (Signed)
Faxed disability form to [facility] attn: Parke Simmers @ [phone].

## 2011-10-07 NOTE — Progress Notes (Signed)
Quick Note:  Call patient with the result and she needs to go to the ED for IV hydration for acute renal failure. ______

## 2011-10-07 NOTE — Telephone Encounter (Signed)
After speaking w/L Mullis SW, called pt to inform her L Mauri Reading will talk w/her the next time she comes in for tx. No answer at pt's home; left vm. Informed pt in vm that this RN will be out of office from 10/08/11 through 10/12/11 and that she would need to speak w/other RN instead of leaving vm on this RN's phone. Pt verbalized understanding. Then spoke w/A Ramond Dial SW who stated she will try to call pt today to discuss transpo.  [facility] RT notified that pt may not have transpo today for tx.

## 2011-10-07 NOTE — Progress Notes (Signed)
Received call from [place] at [facility] regarding new lung cancer drug for EGFR positive NSCLC, Arlys John stated drug was just approved last week and the manufacturer of the drug does not currently have a date that the drug will be available.  Per Dr Donnald Garre, will proceed with [name] 150mg  daily.  SLJ

## 2011-10-07 NOTE — Progress Notes (Signed)
[facility] Telephone:([phone]   Fax:([phone]) [phone]  OFFICE PROGRESS NOTE  [name], MD [address] [place] [address]  DIAGNOSIS: Metastatic non-small cell lung cancer, adenocarcinoma with positive EGFR mutation in exon 19 diagnosed in June of 2013  PRIOR THERAPY: None.  CURRENT THERAPY:Palliative radiotherapy to the right lung under the care of Dr. Dayton Scrape.  INTERVAL HISTORY: Beth [name] 51 y.o. female returns to the clinic today for followup visit. The patient is doing fine today except for the shortness breath with exertion as well as pain on the back and right side of her chest. She is currently undergoing palliative radiotherapy to the right lung mass under the care of Dr. Dayton Scrape. She still have 2 more weeks of radiation. She is currently on MS Contin 15 mg by mouth every 12 hours in addition to morphine sulfate 15 mg by mouth every 4 hours for breakthrough pain. The patient takes her morphine sulfate almost every 2 hours. She was a little bit lethargic today because of her pain medications. The mutation status for EGFR came back positive for mutation in exon 19 which usually carry a good response to treatment with EGFR tyrosine kinase inhibitor, like [name] or the newly approved drug, Afatinib (Gilotrif). The patient denied having any significant fever or chills, no nausea or vomiting. She was referred to Dr. Marin Shutter for consideration of Pleurx catheter placement but because off the central collapse of her right lung it was felt that the procedure will not be helpful.   MEDICAL HISTORY: Past Medical History  Diagnosis Date  . Asthma   . GERD (gastroesophageal reflux disease)   . Hypertension   . Heart murmur   . Fibromyalgia   . Arthritis of knee, degenerative   . Endocarditis     as teenager  . Complication of anesthesia     difficulty waking up  . Shortness of breath   . Lung mass     R- ADENOCARCINOMA  . Sleep apnea      no longer using CPAP  . Pleural effusion 08/30/11  . 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 Anxiety   . Depression     ALLERGIES:  is allergic to meloxicam.  MEDICATIONS:  Current Outpatient Prescriptions  Medication Sig Dispense Refill  . ARIPiprazole (ABILIFY) 5 MG tablet Take 5 mg by mouth daily.      . cyclobenzaprine (FLEXERIL) 5 MG tablet Take 5 mg by mouth daily as needed.       . diphenhydrAMINE (BENADRYL) 25 mg capsule One to two tabs every 6 hours prn allergies  120 capsule  3  . esomeprazole (NEXIUM) 40 MG capsule Take 40 mg by mouth daily before breakfast.      . Fe Fum-FePoly-Vit C-Vit B3 (INTEGRA) 62.5-62.5-40-3 MG CAPS Take 1 tablet by mouth daily.      . fish oil-omega-3 fatty acids 1000 MG capsule Take 2 g by mouth daily.      . folic acid (FOLVITE) 1 MG tablet Take 1 tablet (1 mg total) by mouth daily.  30 tablet  1  . hydrochlorothiazide (HYDRODIURIL) 25 MG tablet Take 25 mg by mouth daily.      . hydrocodone-acetaminophen (LORCET-HD) 5-500 MG per capsule Take 1 capsule by mouth every 6 (six) hours as needed for pain.  30 capsule  1  . lamoTRIgine (LAMICTAL) 25 MG tablet Take 25 mg by mouth daily.      Marland Kitchen morphine (  MS CONTIN) 15 MG 12 hr tablet Take 1 tablet (15 mg total) by mouth 2 (two) times daily.  60 tablet  0  . morphine (MSIR) 15 MG tablet Take 1 tablet (15 mg total) by mouth every 4 (four) hours as needed for pain.  90 tablet  0  . Multiple Vitamins-Minerals (MULTIVITAMIN WITH MINERALS) tablet Take 1 tablet by mouth daily.      Marland Kitchen oxyCODONE-acetaminophen (PERCOCET) 5-325 MG per tablet Take 2 tablets by mouth every 4 (four) hours as needed for pain.  15 tablet  0  . Venlafaxine HCl 150 MG TB24 Take 150 mg by mouth daily.      . Vitamin D, Ergocalciferol, (DRISDOL) 50000 UNITS CAPS Take 50,000 Units by mouth every 7 (seven) days. On wednesdays      . prochlorperazine (COMPAZINE) 10 MG tablet Take 1 tablet (10 mg total) by mouth every 6 (six) hours as  needed.  30 tablet  1    SURGICAL HISTORY:  Past Surgical History  Procedure Date  . Tubal ligation 2002  . Thoracentesis 09/02/11, 09/09/11    right-side pleural effusion    REVIEW OF SYSTEMS:  A comprehensive review of systems was negative except for: Constitutional: positive for anorexia and fatigue Respiratory: positive for cough, dyspnea on exertion and pleurisy/chest pain Musculoskeletal: positive for bone pain   PHYSICAL EXAMINATION: General appearance: alert, cooperative, fatigued and no distress Head: Normocephalic, without obvious abnormality, atraumatic Neck: no adenopathy Lymph nodes: Cervical, supraclavicular, and axillary nodes normal. Resp: diminished breath sounds RLL, RML and RUL and dullness to percussion RLL, RML and RUL Cardio: regular rate and rhythm, S1, S2 normal, no murmur, click, rub or gallop GI: soft, non-tender; bowel sounds normal; no masses,  no organomegaly Extremities: extremities normal, atraumatic, no cyanosis or edema Neurologic: Alert and oriented X 3, normal strength and tone. Normal symmetric reflexes. Normal coordination and gait  ECOG PERFORMANCE STATUS: 1 - Symptomatic but completely ambulatory  Blood pressure 97/67, pulse 131, temperature 98.4 F (36.9 C), temperature source Oral, height 5\' 5"  (1.651 m), weight 233 lb 4.8 oz (105.824 kg), last menstrual period 09/28/2011.  LABORATORY DATA: Lab Results  Component Value Date   WBC 14.3* 10/07/2011   HGB 7.9* 10/07/2011   HCT 25.2* 10/07/2011   MCV 83.0 10/07/2011   PLT 552* 10/07/2011      Chemistry      Component Value Date/Time   NA 131* 10/04/2011 0829   K 3.4* 10/04/2011 0829   CL 94* 10/04/2011 0829   CO2 26 10/04/2011 0829   BUN 12 10/04/2011 0829   CREATININE 0.56 10/04/2011 0829      Component Value Date/Time   CALCIUM 9.0 10/04/2011 0829   ALKPHOS 102 09/22/2011 1356   AST 11 09/22/2011 1356   ALT <8 09/22/2011 1356   BILITOT 0.2* 09/22/2011 1356       RADIOGRAPHIC STUDIES: Dg  Chest 1 View  09/09/2011  *RADIOLOGY REPORT*  Clinical Data: Post right thoracentesis.  CHEST - 1 VIEW  Comparison: 09/08/2011  Findings: Continued near complete opacification of the right hemithorax.  Stable appearance.  No pneumothorax following thoracentesis.  Left lung is clear.  IMPRESSION: Stable appearance.  No pneumothorax.  Original Report Authenticated By: Cyndie Chime, M.D.   Dg Chest 2 View  09/13/2011  *RADIOLOGY REPORT*  Clinical Data: Cardiac and pulmonary history, asthma, hypertension, bronchoscopy  CHEST - 2 VIEW  Comparison: 09/09/2011  Findings: Persistent sub total opacification of right hemithorax by large right pleural effusion  and pulmonary atelectasis. Underlying infiltrate and tumor not excluded. Upper normal heart size. Pulmonary vascular congestion. Question minimal left upper lobe infiltrate. Remaining left lung clear. No pneumothorax. Sclerosis at proximal right humerus question prior infarct.  IMPRESSION: No interval change.  Original Report Authenticated By: Lollie Marrow, M.D.   Dg Chest 2 View  09/08/2011  *RADIOLOGY REPORT*  Clinical Data: Pleural effusion.  CHEST - 2 VIEW  Comparison: CT chest 08/30/2011 and plain film chest 09/02/2011  Findings: Near complete whiteout of the right hemithorax is again seen.  Small focus of aerated lung is seen in the right mid chest. Left lung remains clear.  Heart size is upper normal.  Multifocal sclerotic lesions consistent with metastatic disease is again identified.  IMPRESSION:  1.  No change in near complete whiteout of the right chest consistent with large effusion and extensive airspace disease. 2.  Multifocal osseous metastases.  Original Report Authenticated By: Bernadene Bell. Maricela Curet, M.D.   Ct Angio Chest W/cm &/or Wo Cm  10/04/2011  *RADIOLOGY REPORT*  Clinical Data: Lung cancer with chest pain and shortness of breath. Evaluate for pulmonary embolus versus pleural effusion.  CT ANGIOGRAPHY CHEST  Technique:  Multidetector CT  imaging of the chest using the standard protocol during bolus administration of intravenous contrast. Multiplanar reconstructed images including MIPs were obtained and reviewed to evaluate the vascular anatomy.  Contrast: OMNIPAQUE IOHEXOL 350 MG/ML SOLN  Comparison: PET CT 09/20/2011 and CT chest 08/30/2011.  Findings: No definite pulmonary embolus.  Mediastinal adenopathy measures up to 1.7 cm in the AP window (previously 1.3 cm).  There is a centrally obstructing ill-defined mass in the right lung measuring approximately 9.2 x 6.6 cm (previously 8.8 x 6.1 cm).  A large complex collection of pleural fluid occupies the right hemithorax, with a thick rind of soft tissue. Overall size has decreased slightly from 08/30/2011.  A single locule of air is seen posteriorly within the right pleural space, which may be due to intervening thoracentesis. Pulmonary arteries are enlarged.  Heart is at the upper limits of normal in size.  No pericardial effusion.  Left lower lobe nodule measures 9 mm (previously 7 mm).  Added density in the left lung is due in part to expiratory phase imaging.  There are some areas of more confluent ground-glass in the left upper lobe.  No left pleural fluid.  Right upper and right lower lobe bronchi are obstructed.  Airway is otherwise unremarkable.  Incidental imaging of the upper abdomen shows no acute findings. Retrocrural adenopathy measures up to 10 mm.  Sclerotic lesions are seen throughout the visualized osseous structures.  IMPRESSION:  1.  No definite pulmonary embolus. 2.  Slight progression of stage IV primary bronchogenic carcinoma, when compared baseline examination of 08/30/2011. 3.  Slight interval decrease in size of a presumably malignant right pleural effusion after intervening thoracentesis.  Original Report Authenticated By: Reyes Ivan, M.D.   Nm Pet Image Initial (pi) Skull Base To Thigh  09/20/2011  *RADIOLOGY REPORT*  Clinical Data: Initial treatment  strategy for lung cancer.  NUCLEAR MEDICINE PET SKULL BASE TO THIGH  Fasting Blood Glucose:  86  Technique:  19.6 mCi F-18 FDG was injected intravenously. CT data was obtained and used for attenuation correction and anatomic localization only.  (This was not acquired as a diagnostic CT examination.) Additional exam technical data entered on technologist worksheet.  Comparison:  CT chest dated 08/30/2011  Findings:  Neck: No hypermetabolic lymph nodes in the neck.  Chest:  Near complete collapse of the right lung with associated central obstructing mass/hypermetabolism, max SUV 7.4, corresponding to known lung adenocarcinoma.  8 and 12 mm left lower lobe pulmonary nodules (series 2/images 110 and 117), without convincing hypermetabolism.  1.9 cm right prevascular node (series 2/image 92), max SUV 17.3. Additional right paratracheal and subcarinal nodal metastases, max SUV 6.9 (PET image 97).  Large right pleural effusion with associated pleural thickening and internal debris (series 2/image 132), suggesting malignant effusion.  Foci of gas posteriorly (series 2/image 101), possibly related to prior thoracentesis, although not nondependent.  Abdomen/Pelvis:  No abnormal hypermetabolic activity within the liver, pancreas, adrenal glands, or spleen.  Malrotated right kidney.  Mildly heterogeneous uterus, possibly reflecting uterine fibroids.  Small fat-containing left inguinal hernia.  No hypermetabolic lymph nodes in the abdomen or pelvis.  [place]:  Diffuse sclerotic osseous metastases throughout the visualized axial and appendicular skeleton.  Associated hypermetabolism in the right sternum, max SUV 7.4 (PET image 82).  IMPRESSION: Central obstructing mass in the right lung with near complete collapse and associated hypermetabolism, max SUV 7.4, corresponding to known lung adenocarcinoma.  Right prevascular, paratracheal, and subcarinal nodal metastases, as described above, max SUV 17.3.  Suspected large malignant  right pleural effusion.  Diffuse sclerotic osseous metastases throughout the visualized axial and appendicular skeleton, max SUV 7.4.  Original Report Authenticated By: Charline Bills, M.D.   US Thoracentesis Asp Pleural Space W/img Guide  09/09/2011  *RADIOLOGY REPORT*  Clinical Data:  Right-sided hilar mass, right-sided pleural effusion  ULTRASOUND GUIDED right THORACENTESIS  Comparison:  Previous thoracentesis on 09/02/11  An ultrasound guided thoracentesis was thoroughly discussed with the patient and questions answered.  The benefits, risks, alternatives and complications were also discussed.  The patient understands and wishes to proceed with the procedure.  Written consent was obtained.  Ultrasound was performed to localize and mark an adequate pocket of fluid in the right chest.  There were numerous loculations seen and thickened pleura. The lung was also densely consolidated as well. The largest of the collections was marked for aspiration. The area was then prepped and draped in the normal sterile fashion.  1% Lidocaine was used for local anesthesia.  Under ultrasound guidance a 19 gauge Yueh catheter was introduced.  Thoracentesis was performed, but yielded only approximately 25cc of blood tinged fluid. The catheter was removed and another thorough US exam was done by Dr. Fredia Sorrow but another suitable location for thoracentesis was not found. A dressing was placed.  Complications:  None  Findings: A total of approximately 25 ml of blood tinged fluid was removed. A fluid sample was sent for laboratory analysis.  IMPRESSION: Successful ultrasound guided right thoracentesis yielding 25 ml of pleural fluid.  The pt states she is scheduled for bronchoscopy which is likely to yield better results for tissue diagnosis.  Procedure completed with Dr. Fredia Sorrow.  Read by Brayton El PA-C  Original Report Authenticated By: Reola Calkins, M.D.    ASSESSMENT: This is a very pleasant 51 years old African  American female recently diagnosed with metastatic non-small cell lung cancer, adenocarcinoma with positive EGFR mutation in exon 19. The patient is currently undergoing palliative radiotherapy to the right lung mass.  PLAN: I have a lengthy discussion with the patient today about her disease status especially after the posterior EGFR mutation. I recommended for the patient the following: #1 complete a course of palliative radiotherapy under the care of Dr. Dayton Scrape. #2 she would have MRI of the brain  performed tomorrow for evaluation and to rule out any brain metastasis. #3 I would consider the patient for treatment with oral target agent like [name] or Afatinib. Afatinib is just approved but not available commercially yet and may take several weeks to become available. I will start the patient on [name] 150 mg by mouth daily. I discussed with the patient adverse effect of this treatment including but not limited to skin rash, diarrhea, interstitial lung disease, liver or renal dysfunction. I expect her to start this treatment in the next few days once it is approved by her insurance. #4 She would come back for followup visit in 2 weeks for evaluation and management any adverse effect of her treatment. #5 for pain management, I increase her dose of MS Contin to 30 mg by mouth every 12 hours and the patient will continue on morphine sulfate 15 mg by mouth q. 4 hours as needed for breakthrough pain. #6 the patient was seen by the thoracic navigator as well as the cancer Center social worker to help with her transportation. #7 for anemia, I started the patient on Integra plus 1 capsule by mouth daily. She was getting sample from the clinic. The patient was advised to call immediately if she has any concerning symptoms in the interval.  All questions were answered. The patient knows to call the clinic with any problems, questions or concerns. We can certainly see the patient much sooner if necessary.  I  spent 20 minutes counseling the patient face to face. The total time spent in the appointment was 30 minutes.

## 2011-10-07 NOTE — Progress Notes (Signed)
Spoke with pt at [facility] today.  Questions and concerns addressed 

## 2011-10-07 NOTE — Telephone Encounter (Signed)
Gave pt appt date for 10/21/11 lab and MD

## 2011-10-07 NOTE — Progress Notes (Signed)
Faxed disability papers to [employer] attn: Sunday Shams @ [phone].

## 2011-10-07 NOTE — Progress Notes (Signed)
Patient did not show up for Nutrition appointment on October 07, 2011.

## 2011-10-07 NOTE — Telephone Encounter (Signed)
Called pt re: transportation. Pt states she doesn't feel she should drive due to taking pain meds. Pt has no family in town, [place]. Left vm for L Mullis, SW to contact pt and assist w/transpo. Called pt back to inform. Pt also stated her family lives in [place], [place], and she plans to move there at the end of July. Pt states Dr Arbutus Ped is aware and will co-ordinate her continuing care in [place].

## 2011-10-07 NOTE — Telephone Encounter (Signed)
Message copied by Caren Griffins on Thu Oct 07, 2011  4:07 PM ------      Message from: Si Gaul      Created: Thu Oct 07, 2011  3:57 PM       Call patient with the result and she needs to go to the ED for IV hydration for acute renal failure.

## 2011-10-07 NOTE — Telephone Encounter (Signed)
Called and left msg on pt home and cell phone regarding Dr [facility] orders to go to the ED.  Pt did not answer either phone, left msg's to call back.  Called and spoke to Val in radiation (pt has radiation appt at 5:30pm tonight).  Wanted to let someone one in radiation that she needs to go to the ED in the event she does not call back before 5pm today.  Val verbalized understanding.  Also called rad-onc techs at [facility] [phone] to inform them as well.  SLJ

## 2011-10-08 ENCOUNTER — Other Ambulatory Visit: Payer: Self-pay | Admitting: Internal Medicine

## 2011-10-08 ENCOUNTER — Encounter (HOSPITAL_COMMUNITY): Payer: Self-pay | Admitting: *Deleted

## 2011-10-08 ENCOUNTER — Telehealth: Payer: Self-pay | Admitting: *Deleted

## 2011-10-08 ENCOUNTER — Inpatient Hospital Stay (HOSPITAL_COMMUNITY)
Admission: EM | Admit: 2011-10-08 | Discharge: 2011-10-14 | DRG: 683 | Disposition: A | Discharge status: Home Health | Payer: 59 | Attending: Internal Medicine | Admitting: Internal Medicine

## 2011-10-08 ENCOUNTER — Ambulatory Visit: Payer: 59

## 2011-10-08 ENCOUNTER — Emergency Department (HOSPITAL_COMMUNITY): Payer: 59

## 2011-10-08 ENCOUNTER — Inpatient Hospital Stay: Admission: RE | Admit: 2011-10-08 | Payer: 59 | Source: Ambulatory Visit

## 2011-10-08 ENCOUNTER — Other Ambulatory Visit: Payer: Self-pay

## 2011-10-08 DIAGNOSIS — K5900 Constipation, unspecified: Secondary | ICD-10-CM | POA: Diagnosis present

## 2011-10-08 DIAGNOSIS — I10 Essential (primary) hypertension: Secondary | ICD-10-CM | POA: Diagnosis present

## 2011-10-08 DIAGNOSIS — C779 Secondary and unspecified malignant neoplasm of lymph node, unspecified: Secondary | ICD-10-CM | POA: Diagnosis present

## 2011-10-08 DIAGNOSIS — G4730 Sleep apnea, unspecified: Secondary | ICD-10-CM | POA: Diagnosis present

## 2011-10-08 DIAGNOSIS — J45909 Unspecified asthma, uncomplicated: Secondary | ICD-10-CM | POA: Diagnosis present

## 2011-10-08 DIAGNOSIS — F419 Anxiety disorder, unspecified: Secondary | ICD-10-CM

## 2011-10-08 DIAGNOSIS — C3490 Malignant neoplasm of unspecified part of unspecified bronchus or lung: Secondary | ICD-10-CM | POA: Diagnosis present

## 2011-10-08 DIAGNOSIS — J9811 Atelectasis: Secondary | ICD-10-CM | POA: Diagnosis present

## 2011-10-08 DIAGNOSIS — R000 Tachycardia, unspecified: Secondary | ICD-10-CM | POA: Diagnosis present

## 2011-10-08 DIAGNOSIS — I509 Heart failure, unspecified: Secondary | ICD-10-CM

## 2011-10-08 DIAGNOSIS — D638 Anemia in other chronic diseases classified elsewhere: Secondary | ICD-10-CM | POA: Diagnosis present

## 2011-10-08 DIAGNOSIS — T451X5A Adverse effect of antineoplastic and immunosuppressive drugs, initial encounter: Secondary | ICD-10-CM | POA: Diagnosis present

## 2011-10-08 DIAGNOSIS — E871 Hypo-osmolality and hyponatremia: Secondary | ICD-10-CM | POA: Diagnosis present

## 2011-10-08 DIAGNOSIS — N19 Unspecified kidney failure: Secondary | ICD-10-CM

## 2011-10-08 DIAGNOSIS — Z79899 Other long term (current) drug therapy: Secondary | ICD-10-CM

## 2011-10-08 DIAGNOSIS — IMO0001 Reserved for inherently not codable concepts without codable children: Secondary | ICD-10-CM | POA: Diagnosis present

## 2011-10-08 DIAGNOSIS — D473 Essential (hemorrhagic) thrombocythemia: Secondary | ICD-10-CM | POA: Diagnosis present

## 2011-10-08 DIAGNOSIS — R400 Somnolence: Secondary | ICD-10-CM | POA: Diagnosis present

## 2011-10-08 DIAGNOSIS — K219 Gastro-esophageal reflux disease without esophagitis: Secondary | ICD-10-CM | POA: Diagnosis present

## 2011-10-08 DIAGNOSIS — Z8659 Personal history of other mental and behavioral disorders: Secondary | ICD-10-CM

## 2011-10-08 DIAGNOSIS — R7402 Elevation of levels of lactic acid dehydrogenase (LDH): Secondary | ICD-10-CM | POA: Diagnosis present

## 2011-10-08 DIAGNOSIS — D75839 Thrombocytosis, unspecified: Secondary | ICD-10-CM | POA: Diagnosis present

## 2011-10-08 DIAGNOSIS — E663 Overweight: Secondary | ICD-10-CM

## 2011-10-08 DIAGNOSIS — D649 Anemia, unspecified: Secondary | ICD-10-CM | POA: Diagnosis present

## 2011-10-08 DIAGNOSIS — R7989 Other specified abnormal findings of blood chemistry: Secondary | ICD-10-CM | POA: Diagnosis present

## 2011-10-08 DIAGNOSIS — R7401 Elevation of levels of liver transaminase levels: Secondary | ICD-10-CM | POA: Diagnosis present

## 2011-10-08 DIAGNOSIS — R011 Cardiac murmur, unspecified: Secondary | ICD-10-CM

## 2011-10-08 DIAGNOSIS — D72829 Elevated white blood cell count, unspecified: Secondary | ICD-10-CM | POA: Diagnosis present

## 2011-10-08 DIAGNOSIS — C7951 Secondary malignant neoplasm of bone: Secondary | ICD-10-CM | POA: Diagnosis present

## 2011-10-08 DIAGNOSIS — J90 Pleural effusion, not elsewhere classified: Secondary | ICD-10-CM

## 2011-10-08 DIAGNOSIS — N179 Acute kidney failure, unspecified: Principal | ICD-10-CM | POA: Diagnosis present

## 2011-10-08 DIAGNOSIS — R0902 Hypoxemia: Secondary | ICD-10-CM | POA: Diagnosis present

## 2011-10-08 DIAGNOSIS — J9819 Other pulmonary collapse: Secondary | ICD-10-CM | POA: Diagnosis present

## 2011-10-08 DIAGNOSIS — R918 Other nonspecific abnormal finding of lung field: Secondary | ICD-10-CM

## 2011-10-08 DIAGNOSIS — J4520 Mild intermittent asthma, uncomplicated: Secondary | ICD-10-CM

## 2011-10-08 LAB — COMPREHENSIVE METABOLIC PANEL WITH GFR
ALT: 83 U/L — ABNORMAL HIGH (ref 0–35)
AST: 204 U/L — ABNORMAL HIGH (ref 0–37)
Albumin: 3.2 g/dL — ABNORMAL LOW (ref 3.5–5.2)
CO2: 22 meq/L (ref 19–32)
Calcium: 8.8 mg/dL (ref 8.4–10.5)
Chloride: 84 meq/L — ABNORMAL LOW (ref 96–112)
GFR calc non Af Amer: 10 mL/min — ABNORMAL LOW (ref >90)
Sodium: 126 meq/L — ABNORMAL LOW (ref 135–145)

## 2011-10-08 LAB — CBC WITH DIFFERENTIAL/PLATELET
Basophils Absolute: 0 K/uL (ref 0.0–0.1)
Basophils Relative: 0 % (ref 0–1)
Eosinophils Absolute: 0.1 10*3/uL (ref 0.0–0.7)
Eosinophils Relative: 1 % (ref 0–5)
HCT: 25.5 % — ABNORMAL LOW (ref 36.0–46.0)
Hemoglobin: 8 g/dL — ABNORMAL LOW (ref 12.0–15.0)
Lymphocytes Relative: 11 % — ABNORMAL LOW (ref 12–46)
Lymphs Abs: 1.7 10*3/uL (ref 0.7–4.0)
MCH: 25.7 pg — ABNORMAL LOW (ref 26.0–34.0)
MCHC: 31.4 g/dL (ref 30.0–36.0)
MCV: 82 fL (ref 78.0–100.0)
Monocytes Absolute: 1.3 10*3/uL — ABNORMAL HIGH (ref 0.1–1.0)
Monocytes Relative: 9 % (ref 3–12)
Neutro Abs: 11.9 K/uL — ABNORMAL HIGH (ref 1.7–7.7)
Neutrophils Relative %: 79 % — ABNORMAL HIGH (ref 43–77)
Platelets: 588 K/uL — ABNORMAL HIGH (ref 150–400)
RBC: 3.11 MIL/uL — ABNORMAL LOW (ref 3.87–5.11)
RDW: 17.6 % — ABNORMAL HIGH (ref 11.5–15.5)
WBC: 15.1 K/uL — ABNORMAL HIGH (ref 4.0–10.5)

## 2011-10-08 LAB — PRO B NATRIURETIC PEPTIDE: Pro B Natriuretic peptide (BNP): 17339 pg/mL — ABNORMAL HIGH (ref 0–125)

## 2011-10-08 LAB — COMPREHENSIVE METABOLIC PANEL
Alkaline Phosphatase: 96 U/L (ref 39–117)
BUN: 52 mg/dL — ABNORMAL HIGH (ref 6–23)
Creatinine, Ser: 4.69 mg/dL — ABNORMAL HIGH (ref 0.50–1.10)
GFR calc Af Amer: 12 mL/min — ABNORMAL LOW (ref >90)
Glucose, Bld: 116 mg/dL — ABNORMAL HIGH (ref 70–99)
Potassium: 4.4 mEq/L (ref 3.5–5.1)
Total Bilirubin: 0.4 mg/dL (ref 0.3–1.2)
Total Protein: 8.4 g/dL — ABNORMAL HIGH (ref 6.0–8.3)

## 2011-10-08 LAB — BLOOD GAS, ARTERIAL
Acid-base deficit: 2 mmol/L (ref 0.0–2.0)
Bicarbonate: 24.5 meq/L — ABNORMAL HIGH (ref 20.0–24.0)
Drawn by: 340271
FIO2: 0.32 %
O2 Saturation: 95.8 %
Patient temperature: 98.6
TCO2: 23.9 mmol/L (ref 0–100)
pCO2 arterial: 55.2 mmHg — ABNORMAL HIGH (ref 35.0–45.0)
pH, Arterial: 7.27 — ABNORMAL LOW (ref 7.350–7.450)
pO2, Arterial: 100 mmHg (ref 80.0–100.0)

## 2011-10-08 LAB — URINALYSIS, ROUTINE W REFLEX MICROSCOPIC
Glucose, UA: NEGATIVE mg/dL
Hgb urine dipstick: NEGATIVE
Ketones, ur: NEGATIVE mg/dL
Leukocytes, UA: NEGATIVE
Nitrite: NEGATIVE
Protein, ur: 30 mg/dL — AB
Specific Gravity, Urine: 1.024 (ref 1.005–1.030)
Urobilinogen, UA: 1 mg/dL (ref 0.0–1.0)
pH: 5 (ref 5.0–8.0)

## 2011-10-08 LAB — URINE MICROSCOPIC-ADD ON

## 2011-10-08 LAB — PROTIME-INR
INR: 1.24 (ref 0.00–1.49)
Prothrombin Time: 15.9 s — ABNORMAL HIGH (ref 11.6–15.2)

## 2011-10-08 LAB — PHOSPHORUS: Phosphorus: 7.1 mg/dL — ABNORMAL HIGH (ref 2.3–4.6)

## 2011-10-08 LAB — TROPONIN I: Troponin I: <0.3 ng/mL (ref <0.30)

## 2011-10-08 LAB — MAGNESIUM: Magnesium: 2.3 mg/dL (ref 1.5–2.5)

## 2011-10-08 LAB — APTT: aPTT: 45 seconds — ABNORMAL HIGH (ref 24–37)

## 2011-10-08 MED ORDER — VENLAFAXINE HCL ER 150 MG PO TB24: 150.0000 mg | ORAL_TABLET | Freq: Every day | ORAL | Status: DC

## 2011-10-08 MED ORDER — PANTOPRAZOLE SODIUM 40 MG PO TBEC
40.0000 mg | DELAYED_RELEASE_TABLET | Freq: Every day | ORAL | Status: DC
  Administered 2011-10-08 – 2011-10-13 (×6): 40 mg via ORAL
  Filled 2011-10-08 (×7): qty 1

## 2011-10-08 MED ORDER — LAMOTRIGINE 25 MG PO TABS
25.0000 mg | ORAL_TABLET | Freq: Every day | ORAL | Status: DC
  Administered 2011-10-09 – 2011-10-14 (×6): 25 mg via ORAL
  Filled 2011-10-08 (×6): qty 1

## 2011-10-08 MED ORDER — VENLAFAXINE HCL ER 150 MG PO CP24
150.0000 mg | ORAL_CAPSULE | Freq: Every day | ORAL | Status: DC
  Administered 2011-10-09 – 2011-10-14 (×6): 150 mg via ORAL
  Filled 2011-10-08 (×6): qty 1

## 2011-10-08 MED ORDER — ARIPIPRAZOLE 5 MG PO TABS
5.0000 mg | ORAL_TABLET | Freq: Every day | ORAL | Status: DC
  Administered 2011-10-09 – 2011-10-14 (×6): 5 mg via ORAL
  Filled 2011-10-08 (×6): qty 1

## 2011-10-08 MED ORDER — ONDANSETRON HCL 4 MG PO TABS: 4.0000 mg | ORAL_TABLET | Freq: Four times a day (QID) | ORAL | Status: DC | PRN

## 2011-10-08 MED ORDER — SODIUM CHLORIDE 0.9 % IV BOLUS (SEPSIS)
500.0000 mL | Freq: Once | INTRAVENOUS | Status: AC
  Administered 2011-10-08: 500 mL via INTRAVENOUS

## 2011-10-08 MED ORDER — LEVALBUTEROL HCL 1.25 MG/3ML IN NEBU: 1.2500 mg | INHALATION_SOLUTION | Freq: Once | RESPIRATORY_TRACT | Status: DC

## 2011-10-08 MED ORDER — LEVALBUTEROL HCL 1.25 MG/0.5ML IN NEBU
1.2500 mg | INHALATION_SOLUTION | Freq: Once | RESPIRATORY_TRACT | Status: AC
  Administered 2011-10-08: 1.25 mg via RESPIRATORY_TRACT
  Filled 2011-10-08: qty 0.5

## 2011-10-08 MED ORDER — HEPARIN SODIUM (PORCINE) 5000 UNIT/ML IJ SOLN
5000.0000 [IU] | Freq: Three times a day (TID) | INTRAMUSCULAR | Status: DC
  Administered 2011-10-08 – 2011-10-14 (×17): 5000 [IU] via SUBCUTANEOUS
  Filled 2011-10-08 (×21): qty 1

## 2011-10-08 MED ORDER — METHYLPREDNISOLONE SODIUM SUCC 125 MG IJ SOLR: 125.0000 mg | Freq: Once | INTRAMUSCULAR | Status: DC

## 2011-10-08 MED ORDER — ERLOTINIB HCL 150 MG PO TABS: 150.0000 mg | ORAL_TABLET | Freq: Every day | ORAL | Status: DC

## 2011-10-08 MED ORDER — SODIUM CHLORIDE 0.9 % IV SOLN
INTRAVENOUS | Status: DC
  Administered 2011-10-09 (×3): via INTRAVENOUS
  Administered 2011-10-10: 20 mL via INTRAVENOUS
  Administered 2011-10-10 (×2): via INTRAVENOUS

## 2011-10-08 MED ORDER — ONDANSETRON HCL 4 MG/2ML IJ SOLN
4.0000 mg | Freq: Four times a day (QID) | INTRAMUSCULAR | Status: DC | PRN
  Filled 2011-10-08: qty 2

## 2011-10-08 MED ORDER — HYDROCODONE-ACETAMINOPHEN 5-325 MG PO TABS
1.0000 | ORAL_TABLET | ORAL | Status: DC | PRN
  Administered 2011-10-09: 2 via ORAL
  Administered 2011-10-10 (×2): 1 via ORAL
  Administered 2011-10-10 – 2011-10-12 (×6): 2 via ORAL
  Administered 2011-10-12: 1 via ORAL
  Administered 2011-10-13 – 2011-10-14 (×7): 2 via ORAL
  Filled 2011-10-08 (×2): qty 2
  Filled 2011-10-08: qty 1
  Filled 2011-10-08 (×3): qty 2
  Filled 2011-10-08: qty 1
  Filled 2011-10-08 (×4): qty 2
  Filled 2011-10-08: qty 1
  Filled 2011-10-08 (×5): qty 2
  Filled 2011-10-08: qty 1

## 2011-10-08 MED ORDER — MORPHINE SULFATE 2 MG/ML IJ SOLN
1.0000 mg | INTRAMUSCULAR | Status: DC | PRN
  Administered 2011-10-12 (×2): 1 mg via INTRAVENOUS
  Filled 2011-10-08 (×3): qty 1

## 2011-10-08 NOTE — Telephone Encounter (Signed)
Pt's mother called asking why Beth [name] needed to go to the ED, she does not want to pay the $175 co-pay and cannot afford it.  Spoke to Dr Arline Asp in Dr Arbutus Ped absence, Dr Arline Asp emphasized the importance of pt going to the ED due to Acute Renal Failure and this could be life threatening and will probably need to go additional testing as well as IVF.  Pt's mother verbalized understanding and will take pt to the [facility] ED.  SLJ

## 2011-10-08 NOTE — Progress Notes (Signed)
Pharmacy Follow Up Note - Hydroxyurea   Patient's home med, [name] (erlotinib), has not been verified by pharmacy since patient does not meet criteria for automatic continuation inpatient without oncologist approval.  Erlotinib ([name]) hold criteria  SCr > 1.5x baseline (or > 2 if baseline unknown) --> SCr 4.69  AST or ALT > 3x ULN --> AST 204  Bili > 1.5x ULN  Acute coronary syndrome  Acute CVA  Bullous or exfoliative skin eruption  Gastrointestinal perforation  Unexplained pneumonitis / hypoxemia  Plan:  Holding erlotinib.  Clance Boll, PharmD, BCPS Pager: [phone] 09/04/2011 8:48 AM

## 2011-10-08 NOTE — ED Notes (Signed)
Pt. wheeled to room Res A via wheelchair on 4L of O2. -- Debbie RN, Clance Boll, and MD Ghim at bedside.

## 2011-10-08 NOTE — ED Provider Notes (Signed)
History     CSN: [id number]  Arrival date & time 10/08/11  1523   First MD Initiated Contact with Patient 10/08/11 1550      Chief Complaint  Patient presents with  . Abnormal Lab  . Fatigue    (Consider location/radiation/quality/duration/timing/severity/associated sxs/prior treatment) HPI Comments: Level 5 caveat due to abn vital signs.  Pt was found to have lung tumor and cancer a couple of months ago, quit smoking then per family, had blood tests presumably a couple of days ago and clinic was trying to call her to come to the ED for some reason.  Family is from [place] and just arrived here as well and pt's mother tried to call pt herself a few times and pt did respond and reported she would go to the hospital, since about 11 AM, but only now arrived.  Pt drove self.  Triage discovered that RA Sat was 71%.  Pt is not known to use O2.  She has been told she has asthma, was told 2 years ago she had sarcoid but family has doubts.  Pt denies CP, palpitations.  She admits she has been taking almost double her morphine (2 rather than 1 tablets of 15 mg morphine) every 4 hours recently.    The history is provided by the patient and a relative.    Past Medical History  Diagnosis Date  . Asthma   . GERD (gastroesophageal reflux disease)   . Hypertension   . Heart murmur   . Fibromyalgia   . Arthritis of knee, degenerative   . Endocarditis     as teenager  . Complication of anesthesia     difficulty waking up  . Shortness of breath   . Lung mass     R- ADENOCARCINOMA  . Sleep apnea     no longer using CPAP  . Pleural effusion 08/30/11  . 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 Anxiety   . Depression     Past Surgical History  Procedure Date  . Tubal ligation 2002  . Thoracentesis 09/02/11, 09/09/11    right-side pleural effusion    Family History  Problem Relation Age of Onset  . Hypertension Mother   . Alcohol abuse Father   . Hypertension Maternal Aunt   .  Hypertension Maternal Uncle   . Hypertension Maternal Grandmother   . Hypertension Maternal Grandfather   . Sarcoidosis Sister     History  Substance Use Topics  . Smoking status: Former Smoker -- 0.5 packs/day for 15 years    Types: Cigarettes    Quit date: 01/01/2011  . Smokeless tobacco: Not on file  . Alcohol Use: No     per H&P, used to drink alcohol regularly    OB History    Grav Para Term Preterm Abortions TAB SAB Ect Mult Living                  Review of Systems  Unable to perform ROS: Unstable vital signs    Allergies  Meloxicam  Home Medications   Current Outpatient Rx  Name Route Sig Dispense Refill  . ARIPIPRAZOLE 5 MG PO TABS Oral Take 5 mg by mouth daily.    . CYCLOBENZAPRINE HCL 5 MG PO TABS Oral Take 5 mg by mouth daily as needed.     Marland Kitchen DIPHENHYDRAMINE HCL 25 MG PO CAPS  One to two tabs every 6 hours prn allergies 120 capsule 3  . ERLOTINIB 150 MG  PO TABS Oral Take 1 tablet (150 mg total) by mouth daily. Take on an empty stomach 1 hour before meals or 2 hours after. 30 tablet 2  . ESOMEPRAZOLE MAGNESIUM 40 MG PO CPDR Oral Take 40 mg by mouth daily before breakfast.    . INTEGRA 62.5-62.5-40-3 MG PO CAPS Oral Take 1 tablet by mouth daily.    . OMEGA-3 FATTY ACIDS 1000 MG PO CAPS Oral Take 2 g by mouth daily.    Marland Kitchen FOLIC ACID 1 MG PO TABS Oral Take 1 tablet (1 mg total) by mouth daily. 30 tablet 1  . HYDROCHLOROTHIAZIDE 25 MG PO TABS Oral Take 25 mg by mouth daily.    Marland Kitchen HYDROCODONE-ACETAMINOPHEN 5-500 MG PO CAPS Oral Take 1 capsule by mouth every 6 (six) hours as needed for pain. 30 capsule 1  . LAMOTRIGINE 25 MG PO TABS Oral Take 25 mg by mouth daily.    . MORPHINE SULFATE ER 15 MG PO TBCR Oral Take 1 tablet (15 mg total) by mouth 2 (two) times daily. 60 tablet 0  . MORPHINE SULFATE 15 MG PO TABS Oral Take 1 tablet (15 mg total) by mouth every 4 (four) hours as needed for pain. 90 tablet 0  . MULTI-VITAMIN/MINERALS PO TABS Oral Take 1 tablet by mouth  daily.    . OXYCODONE-ACETAMINOPHEN 5-325 MG PO TABS Oral Take 2 tablets by mouth every 4 (four) hours as needed for pain. 15 tablet 0  . VENLAFAXINE HCL ER 150 MG PO TB24 Oral Take 150 mg by mouth daily.    Marland Kitchen VITAMIN D (ERGOCALCIFEROL) 50000 UNITS PO CAPS Oral Take 50,000 Units by mouth every 7 (seven) days. On wednesdays    . PROCHLORPERAZINE MALEATE 10 MG PO TABS Oral Take 1 tablet (10 mg total) by mouth every 6 (six) hours as needed. 30 tablet 1    BP 126/89  Pulse 116  Temp 98.5 F (36.9 C) (Oral)  Resp 13  SpO2 100%  LMP 09/28/2011  Physical Exam  Nursing note and vitals reviewed. Constitutional: She appears well-developed and well-nourished. She appears listless. She has a sickly appearance. She appears distressed.  HENT:  Head: Normocephalic.  Eyes: Pupils are equal, round, and reactive to light. No scleral icterus.  Neck: Normal range of motion. Neck supple.  Cardiovascular: S1 normal and S2 normal.  Tachycardia present.   No murmur heard. Pulmonary/Chest: Tachypnea noted. She has no decreased breath sounds. She has no wheezes. She has rhonchi. She has no rales.  Abdominal: Soft. She exhibits no distension. There is no tenderness. There is no rebound.  Musculoskeletal: She exhibits no edema and no tenderness.  Neurological: She appears listless.  Skin: Skin is warm. No rash noted. She is diaphoretic.    ED Course  Procedures (including critical care time)   CRITICAL CARE Performed by: Lear Ng.   Total critical care time: 40 MIN  Critical care time was exclusive of separately billable procedures and treating other patients.  Critical care was necessary to treat or prevent imminent or life-threatening deterioration.  Critical care was time spent personally by me on the following activities: development of treatment plan with patient and/or surrogate as well as nursing, discussions with consultants, evaluation of patient's response to treatment, examination  of patient, obtaining history from patient or surrogate, ordering and performing treatments and interventions, ordering and review of laboratory studies, ordering and review of radiographic studies, pulse oximetry and re-evaluation of patient's condition.   Labs Reviewed  CBC WITH DIFFERENTIAL -  Abnormal; Notable for the following:    WBC 15.1 (*)     RBC 3.11 (*)     Hemoglobin 8.0 (*)     HCT 25.5 (*)     MCH 25.7 (*)     RDW 17.6 (*)     Platelets 588 (*)     Neutrophils Relative 79 (*)     Neutro Abs 11.9 (*)     Lymphocytes Relative 11 (*)     Monocytes Absolute 1.3 (*)     All other components within normal limits  COMPREHENSIVE METABOLIC PANEL - Abnormal; Notable for the following:    Sodium 126 (*)     Chloride 84 (*)     Glucose, Bld 116 (*)     BUN 52 (*)     Creatinine, Ser 4.69 (*)     Total Protein 8.4 (*)     Albumin 3.2 (*)     AST 204 (*)     ALT 83 (*)     GFR calc non Af Amer 10 (*)     GFR calc Af Amer 12 (*)     All other components within normal limits  PRO B NATRIURETIC PEPTIDE - Abnormal; Notable for the following:    Pro B Natriuretic peptide (BNP) 17339.0 (*)     All other components within normal limits  BLOOD GAS, ARTERIAL - Abnormal; Notable for the following:    pH, Arterial 7.270 (*)     pCO2 arterial 55.2 (*)     Bicarbonate 24.5 (*)     All other components within normal limits  URINALYSIS, ROUTINE W REFLEX MICROSCOPIC - Abnormal; Notable for the following:    APPearance CLOUDY (*)     Bilirubin Urine SMALL (*)     Protein, ur 30 (*)     All other components within normal limits  PROTIME-INR - Abnormal; Notable for the following:    Prothrombin Time 15.9 (*)     All other components within normal limits  APTT - Abnormal; Notable for the following:    aPTT 45 (*)     All other components within normal limits  URINE MICROSCOPIC-ADD ON - Abnormal; Notable for the following:    Squamous Epithelial / LPF FEW (*)     Bacteria, UA FEW (*)       Casts HYALINE CASTS (*)     All other components within normal limits  TROPONIN I  PHOSPHORUS  MAGNESIUM  URINE CULTURE   Dg Chest Port 1 View  10/08/2011  *RADIOLOGY REPORT*  Clinical Data: Lung mass.  Pleural effusion.  Hypertension.  PORTABLE CHEST - 1 VIEW  Comparison: 10/04/2011  Findings: The patient is rotated to the right on today's exam, resulting in reduced diagnostic sensitivity and specificity. Complete opacification of the right hemithorax noted, with bowel to the right of the heart indicating the known large anterior diaphragmatic hernia.  The patient has known left-sided pulmonary nodules based on recent CT scan; these are only apparent is a vague lingular densities on chest radiography.  No pneumothorax observed.  IMPRESSION: 1.  Complete opacification of the right hemithorax, similar to the appearance from CT scan for days ago.  2.  Bowel gas projects to the right of the heart, compatible with the known large right anterior diaphragmatic hernia.  3.  Vague densities in the left lung correspond to previously seen pulmonary nodules.  Original Report Authenticated By: Dellia Cloud, M.D.     1. CHF (congestive heart failure)  2. Renal failure   3. Elevated LFTs   4. Lung cancer     RA sat of 71% is interpreted to be abn.    ECG at time 16:02 shows sinus tachycardia at rate 117, borderline LAD, poor R wave progression V1-V5 with Rsr` in V1.  Minimal flat t waves similar to prior ECG from 08/04/11 except rate is faster by 21 bpm   6:33 PM Discussed with [facility] as well as Dr. Geanie Berlin to eventually consult on pt.  No emergent need at this time in my opinion.  sats are ok, BP and HR are ok, mildly tachy, but no cardiac collapse, will admit to step down per [facility].    MDM  Pt's sats are up to 99% on O2 at 4L [place].  Pt is somnolent, could be due to morphine, but is awake, talkative, sensical, appears dehydrated . She reports she is urinating more than usual.  Denies abd  pain,          Gavin Pound. Oletta Lamas, MD 10/08/11 1610

## 2011-10-08 NOTE — Telephone Encounter (Signed)
Called and spoke to pt's mother Beth [name], informed her that we have been unsuccessful in contacting pt to send her to the ED.  Beth [name] stated she has been unable to get in touch with her as well and is going to drive from [place] to make sure she is okay.  Beth [name] stated she will take her to the ED.   Asked Beth [name] if pt's son is in town and she said he is in [place] and to not call him.  Dr Donnald Garre aware.  SLJ

## 2011-10-08 NOTE — Progress Notes (Signed)
Page Md Betti Cruz regarding the Pt's urine output.The Pt is currently not making any urine. A F/C was placed in the Ed. Per ED report that last time the Pt made urine was after an In and Out cath was placed, she made about 500cc of urine. They next placed a F/C and since then her F/C bag has been dry. Bladder scan completed 197 cc of urine was the result. F/c has been irrigated, inserted a few inches further deflated  and inflated and aspirated still no results. MD aware no new orders given, told to monitor for the duration of the shift. Will continue to monitor.

## 2011-10-08 NOTE — Telephone Encounter (Signed)
Dr. Burchette pt  

## 2011-10-08 NOTE — H&P (Addendum)
[facility] History and Physical  Beth [name] ZOX:[id number] DOB: 12-21-1960 DOA: 10/08/2011  Referring physician: ED physician PCP: Beth Covey, MD   Chief Complaint: Somnolence  HPI:  Pt is 51 yo female who was recently diagnosed with lung cancer several months ago and has seen Dr. Shirline Frees in an outpatient setting who was also apparently see at the clinic several days prior to admission and had blood tests done. She was called from the clinic to be advised to go to the ED for further evaluation as she was found to be in acute renal failure per blood tests. Oncology office was not able to get in touch with her and family from [place] tried as well and pt was finally brought to ED but due to her somewhat somnolent status I am not able to obtain details on further history, please note most of this history obtained from ED physician and review of notes. Pt apparently drove herself to the hospital. In ED she was found to be hypoxic with O2 sat's in 70's. She denies any shortness of breath or chest pain, reports taking several different medications for pain control but unable to tell me the exact amount and names.   Assessment/Plan  Principal Problem:  *Somnolence - unclear what the exact etiology is at this time but likely secondary to narcotic use in an attempt to control symptoms - will admit the pt to telemetry floor for further evaluation and management - pt is full code  - will monitor vitals per floor protocol - will hold all home analgesia at this time and provide percocet and morphine as needed - once mental status improves we can slowly introduce home medication regimen, longer acting regimen  Active Problems:  Normocytic anemia - secondary to chronic disease - will continue to monitor Hg and Hct - will hold of on transfusion for now as it appears as this it pt's baseline - will decide in AM on transfusion based on Hg and Hct level   Malignant neoplasm of  bronchus and lung, unspecified site - will need to notify oncology service in AM if needed, will let primary team decide - per recent PET scan this is metastatic adenocarcinoma, osseous and nodal mets noted  - imaging studies appear unchanged from recent diagnostic tests   Leukocytosis - unclear etiology and so far I can not tell if there is an infectious etiology - will follow up on urinalysis and urine culture, will check blood culture if pt spikes fever > 100.5 F - CBC in AM   Hyponatremia - likely multifactorial and secondary to ? SIADH vs pre renal etiology - will continue hydration with NS for now - BMP in AM   Acute renal failure - unclear etiology at this time - will hold all nephrotoxic agents - IVF NS at 100 cc/hr - BMP in AM - will order renal US as well   Essential hypertension, benign - hold home BP medications for now, HCTZ - will monitor vitals per floor protocol - readjust the regimen as indicated   GERD - continue Protonix   Thrombocytosis - likely reactive - CBC in AM   Transaminitis - will obtain CMET in AM  Code Status: Full Family Communication: No family at bedside Disposition Plan: Will admit to step down unit    Review of Systems:   Constitutional: Negative for fever, chills and malaise/fatigue. Negative for diaphoresis.  HENT: Negative for hearing loss, ear pain, nosebleeds, congestion, sore throat, neck pain, tinnitus and ear discharge.  Eyes: Negative for blurred vision, double vision, photophobia, pain, discharge and redness.  Respiratory: Negative for cough, hemoptysis, sputum production, shortness of breath, wheezing and stridor.   Cardiovascular: Negative for chest pain, palpitations, orthopnea, claudication and leg swelling.  Gastrointestinal: Negative for nausea, vomiting and abdominal pain. Negative for heartburn, constipation, blood in stool and melena.  Genitourinary: Negative for dysuria, urgency, frequency, hematuria and flank  pain.  Musculoskeletal: Positive for myalgias, back pain. Skin: Negative for itching and rash.  Neurological: Negative for dizziness, positive for generalized weakness. Negative for tingling, tremors, sensory change, speech change, focal weakness, loss of consciousness and headaches.  Endo/Heme/Allergies: Negative for environmental allergies and polydipsia. Does not bruise/bleed easily.  Psychiatric/Behavioral: Negative for suicidal ideas. The patient is not nervous/anxious.      Past Medical History  Diagnosis Date  . Asthma   . GERD (gastroesophageal reflux disease)   . Hypertension   . Heart murmur   . Fibromyalgia   . Arthritis of knee, degenerative   . Endocarditis     as teenager  . Complication of anesthesia     difficulty waking up  . Shortness of breath   . Lung mass     R- ADENOCARCINOMA  . Sleep apnea     no longer using CPAP  . Pleural effusion 08/30/11  . 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 Anxiety   . Depression    Past Surgical History  Procedure Date  . Tubal ligation 2002  . Thoracentesis 09/02/11, 09/09/11    right-side pleural effusion   Social History:  reports that she quit smoking about 9 months ago. Her smoking use included Cigarettes. She has a 7.5 pack-year smoking history. She does not have any smokeless tobacco history on file. She reports that she does not drink alcohol or use illicit drugs.  Allergies  Allergen Reactions  . Meloxicam     Possible GI bleed    Family History  Problem Relation Age of Onset  . Hypertension Mother   . Alcohol abuse Father   . Hypertension Maternal Aunt   . Hypertension Maternal Uncle   . Hypertension Maternal Grandmother   . Hypertension Maternal Grandfather   . Sarcoidosis Sister     Prior to Admission medications   Medication Sig Start Date End Date Taking? Authorizing Provider  ARIPiprazole (ABILIFY) 5 MG tablet Take 5 mg by mouth daily. 07/12/11 08/27/15 Yes Beth Covey, MD    cyclobenzaprine (FLEXERIL) 5 MG tablet Take 5 mg by mouth daily as needed.  07/12/11  Yes Historical Provider, MD  diphenhydrAMINE (BENADRYL) 25 mg capsule One to two tabs every 6 hours prn allergies 10/05/11  Yes Beth Covey, MD  erlotinib ([name]) 150 MG tablet Take 1 tablet (150 mg total) by mouth daily. Take on an empty stomach 1 hour before meals or 2 hours after. 10/07/11 11/06/11 Yes Si Gaul, MD  esomeprazole (NEXIUM) 40 MG capsule Take 40 mg by mouth daily before breakfast.   Yes Historical Provider, MD  Fe Fum-FePoly-Vit C-Vit B3 (INTEGRA) 62.5-62.5-40-3 MG CAPS Take 1 tablet by mouth daily. 09/22/11  Yes Si Gaul, MD  fish oil-omega-3 fatty acids 1000 MG capsule Take 2 g by mouth daily.   Yes Historical Provider, MD  folic acid (FOLVITE) 1 MG tablet Take 1 tablet (1 mg total) by mouth daily. 09/22/11 09/21/12 Yes Si Gaul, MD  hydrochlorothiazide (HYDRODIURIL) 25 MG tablet Take 25 mg by mouth daily.   Yes Historical Provider, MD  hydrocodone-acetaminophen (LORCET-HD) 5-500 MG per capsule Take 1 capsule by mouth every 6 (six) hours as needed for pain. 09/30/11 10/10/11 Yes Maryln Gottron, MD  lamoTRIgine (LAMICTAL) 25 MG tablet Take 25 mg by mouth daily.   Yes Historical Provider, MD  morphine (MS CONTIN) 15 MG 12 hr tablet Take 1 tablet (15 mg total) by mouth 2 (two) times daily. 10/05/11  Yes Lurline Hare, MD  morphine (MSIR) 15 MG tablet Take 1 tablet (15 mg total) by mouth every 4 (four) hours as needed for pain. 10/05/11 10/15/11 Yes Lurline Hare, MD  Multiple Vitamins-Minerals (MULTIVITAMIN WITH MINERALS) tablet Take 1 tablet by mouth daily.   Yes Historical Provider, MD  oxyCODONE-acetaminophen (PERCOCET) 5-325 MG per tablet Take 2 tablets by mouth every 4 (four) hours as needed for pain. 10/04/11 10/14/11 Yes Rolan Bucco, MD  Venlafaxine HCl 150 MG TB24 Take 150 mg by mouth daily.   Yes Historical Provider, MD  Vitamin D, Ergocalciferol, (DRISDOL) 50000 UNITS  CAPS Take 50,000 Units by mouth every 7 (seven) days. On wednesdays   Yes Historical Provider, MD  prochlorperazine (COMPAZINE) 10 MG tablet Take 1 tablet (10 mg total) by mouth every 6 (six) hours as needed. 09/22/11 09/29/11  Si Gaul, MD   Physical Exam: Filed Vitals:   10/08/11 1546 10/08/11 1605 10/08/11 1619 10/08/11 1653  BP:  126/89 126/89   Pulse:   116   Temp:      TempSrc:      Resp:  18 13   SpO2: 99% 100% 98% 100%    Physical Exam  Constitutional: Appears slightly somnolent but not in distress.  HENT: Normocephalic. External right and left ear normal. Oropharynx is clear and moist.  Eyes: Conjunctivae and EOM are normal. PERRLA, no scleral icterus.  Neck: Normal ROM. Neck supple. No JVD. No tracheal deviation. No thyromegaly.  CVS: Regular rhythm, tachycardic, S1/S2 +, SEM 2/6, no gallops, no carotid bruit.  Pulmonary: Effort and breath sounds normal, no stridor, rhonchi, wheezes, rales.  Abdominal: Soft. BS +,  no distension, tenderness, rebound or guarding.  Musculoskeletal: Normal range of motion. No edema and no tenderness.  Lymphadenopathy: No lymphadenopathy noted, cervical, inguinal. Neuro: Somnolent. Normal reflexes, muscle tone coordination. No cranial nerve deficit. Skin: Skin is warm and dry. No rash noted. Not diaphoretic. No erythema. No pallor.   Labs on Admission:  Basic Metabolic Panel:  Lab 10/08/11 3086 10/07/11 0933 10/04/11 0829  NA 126* 131* 131*  K 4.4 4.1 3.4*  CL 84* 92* 94*  CO2 22 24 26   GLUCOSE 116* 142* 120*  BUN 52* 26* 12  CREATININE 4.69* 3.39* 0.56  CALCIUM 8.8 8.5 9.0  MG -- -- --  PHOS -- -- --   Liver Function Tests:  Lab 10/08/11 1648 10/07/11 0933  AST 204* 21  ALT 83* <8  ALKPHOS 96 87  BILITOT 0.4 0.4  PROT 8.4* 7.5  ALBUMIN 3.2* 3.7    CBC:  Lab 10/08/11 1648 10/07/11 0933 10/04/11 0829  WBC 15.1* 14.3* 10.5  NEUTROABS 11.9* 11.1* 7.4  HGB 8.0* 7.9* 8.2*  HCT 25.5* 25.2* 26.7*  MCV 82.0 83.0 82.9    PLT 588* 552* 547*   Cardiac Enzymes:  Lab 10/08/11 1648 10/04/11 0829  CKTOTAL -- 24  CKMB -- 1.0  CKMBINDEX -- --  TROPONINI <0.30 <0.30    Radiological Exams on Admission:  Dg Chest Port 1 View 10/08/2011  IMPRESSION:  1.  Complete opacification of the right hemithorax, similar to the  appearance from CT scan for days ago.   2.  Bowel gas projects to the right of the heart, compatible with the known large right anterior diaphragmatic hernia.   3.  Vague densities in the left lung correspond to previously seen pulmonary nodules.     EKG: Normal sinus rhythm, no ST/T wave changes  Debbora Presto, MD  [facility] Pager [phone]  If 7PM-7AM, please contact night-coverage [email or web address] Password [email or web address] 10/08/2011, 6:20 PM

## 2011-10-08 NOTE — ED Notes (Signed)
Please call family:  Juanita Swaziland (mother) [phone] or Rolanda Lundborg (sister) [phone] w/ any concerns.

## 2011-10-08 NOTE — Telephone Encounter (Signed)
At approximately 9:00am Dr Arbutus Ped attempted to call patient home and cell # to inform pt she is in acute renal failure and she needs to go to the ED.  No answer on home or cell #.  SLJ

## 2011-10-08 NOTE — ED Notes (Signed)
Pt reports her drs office called and told her her labs were abnormal and to come to ER. Pt nodding off in mid-sentence. Pt sats on RA 71%, Pt place on 02 at 6L sats increased to 95%. Pt states she is sleepy because of "morphine"

## 2011-10-09 ENCOUNTER — Encounter (HOSPITAL_COMMUNITY): Payer: Self-pay | Admitting: *Deleted

## 2011-10-09 DIAGNOSIS — C3490 Malignant neoplasm of unspecified part of unspecified bronchus or lung: Secondary | ICD-10-CM

## 2011-10-09 DIAGNOSIS — N19 Unspecified kidney failure: Secondary | ICD-10-CM

## 2011-10-09 DIAGNOSIS — R7989 Other specified abnormal findings of blood chemistry: Secondary | ICD-10-CM | POA: Diagnosis present

## 2011-10-09 DIAGNOSIS — R0902 Hypoxemia: Secondary | ICD-10-CM | POA: Diagnosis present

## 2011-10-09 LAB — GLUCOSE, CAPILLARY

## 2011-10-09 LAB — COMPREHENSIVE METABOLIC PANEL
BUN: 57 mg/dL — ABNORMAL HIGH (ref 6–23)
CO2: 24 mEq/L (ref 19–32)
Calcium: 8.8 mg/dL (ref 8.4–10.5)
Creatinine, Ser: 2.55 mg/dL — ABNORMAL HIGH (ref 0.50–1.10)
GFR calc Af Amer: 24 mL/min — ABNORMAL LOW (ref >90)
GFR calc non Af Amer: 21 mL/min — ABNORMAL LOW (ref >90)
Glucose, Bld: 109 mg/dL — ABNORMAL HIGH (ref 70–99)

## 2011-10-09 NOTE — Progress Notes (Signed)
Page MD Deterding. Made her aware of the Pt's change in pupil status from a 3 to a sluggish 2 . Pt Alert x3  Does know her name, the date and year and president. Does not know what has brought her to the hospital. Changed her F/C from a 14 fr to 18 fr got back 350 cc of amber urine. Her vaginal area appear to have some white discharge DR Betti Cruz made aware no orders given will monitor.

## 2011-10-09 NOTE — Progress Notes (Addendum)
[facility] PROGRESS NOTE  Braelee Swaziland ZOX:[id number] DOB: 1960-07-04 DOA: 10/08/2011 PCP: [name], MD Oncology: Dr. Arbutus Ped  Assessment/Plan: Principal Problem:  *Somnolence, likely from decreased renal clearance of pain medication in the setting of ARF / hypoxia  Admitted to SDU for close monitoring.  Hypoxia likely from respiratory depression.  Improved with supplemental oxygen.  Oxygen saturations 95-100% overnight.  Flexaril, MS Contin, MSIR placed on hold. Active Problems:  Elevated BNP  Likely reflective of ARF.  CXR not consistent with significant pulmonary edema.  Essential hypertension, benign  HCTZ on hold secondary to ARF.  BP stable.  GERD  Continue PPI.  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  On iron supplement at home (currently on hold).  Baseline hemoglobin 7-8.0 mg/dL.  Stage IV adenocarcinoma of lung  Receiving [name] at home.  [name] currently on hold.  Leukocytosis  U/A clear.  CXR not indicative of PNA.  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  Mild, gently hydrate.  Acute renal failure with hyperphosphatemia  May be from [name].  [name] and HCTZ on hold.  Renal ultrasound negative for obstruction.  Continue to hydrate.  Creatinine improving.  Dr. Myna Hidalgo notified.  Thrombocytosis  Likely reactive.  Transaminitis  Likely a side effect of tarceva.  No obvious liver metastasis noted on PET scan.   Code Status: Full Family Communication: Mother is apparently coming from [place].  Will meet with her when she arrives. Disposition Plan: Home when stable.   Brief narrative: [name] is a 51 year old woman with newly diagnosed stage IV adenocarcinoma of the lung (started radiation treatment on 09/29/11) who was found to have ARF when routine blood work done at the [facility] on 10/07/11 revealed a creatinine of 3.39 (baseline creatinine 0.5-0.6).  [facility], and  she ultimately was brought to the hospital by her family due to somnolence.  Upon initial evaluation in the ER, she had oxygen saturations in the 70's.  Medical Consultants:  Dr. Myna Hidalgo, Oncology  Other Consultants:  None  Procedures:  None  Antibiotics:  None  HPI/Subjective: Mrs. [place]az[place]and is still a bit somnolent, but awakens to voice and light touch.  She moans and answers "yes" and "no" questions.  Denies significant pain and dyspnea at present.  Oriented to self and place.  Objective: Filed Vitals:   10/09/11 0515 10/09/11 0545 10/09/11 0600 10/09/11 0615  BP: 143/78 115/59    Pulse: 108 106 109 208  Temp:      TempSrc:      Resp: 14 17 14 14   Weight:      SpO2: 96% 95% 96% 100%    Intake/Output Summary (Last 24 hours) at 10/09/11 0735 Last data filed at 10/09/11 0600  Gross per 24 hour  Intake      0 ml  Output    750 ml  Net   -750 ml    Exam: Gen:  Sleepy. Cardiovascular:  RRR, No M/R/G Respiratory: Lungs diminished in the bases. Gastrointestinal: Abdomen soft, NT/ND with normal active bowel sounds. Extremities: No C/E/C  Data Reviewed: Basic Metabolic Panel:  Lab 10/09/11 8119 10/08/11 1648 10/07/11 0933 10/04/11 0829  NA 130* 126* 131* 131*  K 4.4 4.4 -- --  CL 90* 84* 92* 94*  CO2 24 22 24 26   GLUCOSE 109* 116* 142* 120*  BUN 57* 52* 26* 12  CREATININE 2.55* 4.69* 3.39* 0.56  CALCIUM 8.8 8.8 8.5 9.0  MG -- 2.3 -- --  PHOS -- 7.1* -- --   GFR The CrCl is unknown because both a height and weight (above a minimum accepted value) are required for this calculation. Liver Function Tests:  Lab 10/09/11 0533 10/08/11 1648 10/07/11 0933  AST 175* 204* 21  ALT 109* 83* <8  ALKPHOS 90 96 87  BILITOT 0.3 0.4 0.4  PROT 7.8 8.4* 7.5  ALBUMIN 2.9* 3.2* 3.7   Coagulation profile  Lab 10/08/11 1648  INR 1.24  PROTIME --    CBC:  Lab 10/08/11 1648 10/07/11 0933 10/04/11 0829  WBC 15.1* 14.3* 10.5  NEUTROABS 11.9* 11.1* 7.4  HGB 8.0* 7.9*  8.2*  HCT 25.5* 25.2* 26.7*  MCV 82.0 83.0 82.9  PLT 588* 552* 547*   Cardiac Enzymes:  Lab 10/08/11 1648 10/04/11 0829  CKTOTAL -- 24  CKMB -- 1.0  CKMBINDEX -- --  TROPONINI <0.30 <0.30   BNP (last 3 results)  Basename 10/08/11 1648  PROBNP 17339.0*   Microbiology Recent Results (from the past 240 hour(s))  TECHNOLOGIST REVIEW     Status: Normal   Collection Time   10/07/11  9:33 AM      Component Value Range Status Comment   Technologist Review 3 %nRBC   Final    Urinalysis    Component Value Date/Time   COLORURINE YELLOW 10/08/2011 1654   APPEARANCEUR CLOUDY* 10/08/2011 1654   LABSPEC 1.024 10/08/2011 1654   PHURINE 5.0 10/08/2011 1654   GLUCOSEU NEGATIVE 10/08/2011 1654   HGBUR NEGATIVE 10/08/2011 1654   BILIRUBINUR SMALL* 10/08/2011 1654   KETONESUR NEGATIVE 10/08/2011 1654   PROTEINUR 30* 10/08/2011 1654   UROBILINOGEN 1.0 10/08/2011 1654   NITRITE NEGATIVE 10/08/2011 1654   LEUKOCYTESUR NEGATIVE 10/08/2011 1654      Studies:  US Renal 10/08/2011 IMPRESSION: Negative renal ultrasound.  No hydronephrosis.  Original Report Authenticated By: Charline Bills, M.D.    Dg Chest Port 1 View 10/08/2011 IMPRESSION: 1.  Complete opacification of the right hemithorax, similar to the appearance from CT scan for days ago.  2.  Bowel gas projects to the right of the heart, compatible with the known large right anterior diaphragmatic hernia.  3.  Vague densities in the left lung correspond to previously seen pulmonary nodules.  Original Report Authenticated By: Dellia Cloud, M.D.   Scheduled Meds:    . ARIPiprazole  5 mg Oral Daily  . heparin  5,000 Units Subcutaneous Q8H  . lamoTRIgine  25 mg Oral Daily  . levalbuterol  1.25 mg Nebulization Once  . pantoprazole  40 mg Oral Daily  . sodium chloride  500 mL Intravenous Once  . venlafaxine XR  150 mg Oral Daily  . DISCONTD: erlotinib  150 mg Oral Daily  . DISCONTD: levalbuterol  1.25 mg Nebulization Once  . DISCONTD:  methylPREDNISolone (SOLU-MEDROL) injection  125 mg Intravenous Once  . DISCONTD: Venlafaxine HCl  150 mg Oral Daily   Continuous Infusions:    . sodium chloride 125 mL/hr at 10/09/11 0600    Time spent: 45 minutes.   LOS: 1 day   Jazelle Achey  [facility] Pager [phone].  If 8PM-8AM, please contact night-coverage at [email or web address], password [facility] 10/09/2011, 7:35 AM

## 2011-10-09 NOTE — Consult Note (Signed)
[id number] is the consult note.  Pete e.

## 2011-10-10 DIAGNOSIS — R000 Tachycardia, unspecified: Secondary | ICD-10-CM | POA: Diagnosis present

## 2011-10-10 LAB — CBC
Hemoglobin: 7.1 g/dL — ABNORMAL LOW (ref 12.0–15.0)
MCH: 25.3 pg — ABNORMAL LOW (ref 26.0–34.0)
RBC: 2.73 MIL/uL — ABNORMAL LOW (ref 3.87–5.11)
WBC: 9.4 10*3/uL (ref 4.0–10.5)

## 2011-10-10 LAB — COMPREHENSIVE METABOLIC PANEL
AST: 70 U/L — ABNORMAL HIGH (ref 0–37)
Albumin: 2.3 g/dL — ABNORMAL LOW (ref 3.5–5.2)
BUN: 30 mg/dL — ABNORMAL HIGH (ref 6–23)
Calcium: 8.5 mg/dL (ref 8.4–10.5)
Creatinine, Ser: 0.62 mg/dL (ref 0.50–1.10)
GFR calc non Af Amer: >90 mL/min (ref >90)

## 2011-10-10 LAB — URINE CULTURE: Colony Count: 70000

## 2011-10-10 LAB — D-DIMER, QUANTITATIVE: D-Dimer, Quant: 9.09 ug/mL-FEU — ABNORMAL HIGH (ref 0.00–0.48)

## 2011-10-10 MED ORDER — IPRATROPIUM BROMIDE 0.02 % IN SOLN
0.5000 mg | Freq: Four times a day (QID) | RESPIRATORY_TRACT | Status: DC
  Administered 2011-10-10: 0.5 mg via RESPIRATORY_TRACT
  Filled 2011-10-10 (×3): qty 2.5

## 2011-10-10 MED ORDER — ALBUTEROL SULFATE (5 MG/ML) 0.5% IN NEBU
2.5000 mg | INHALATION_SOLUTION | RESPIRATORY_TRACT | Status: DC | PRN
  Administered 2011-10-10: 2.5 mg via RESPIRATORY_TRACT
  Filled 2011-10-10: qty 0.5

## 2011-10-10 MED ORDER — ALBUTEROL SULFATE (5 MG/ML) 0.5% IN NEBU
2.5000 mg | INHALATION_SOLUTION | Freq: Four times a day (QID) | RESPIRATORY_TRACT | Status: DC
  Administered 2011-10-10 – 2011-10-11 (×2): 2.5 mg via RESPIRATORY_TRACT
  Filled 2011-10-10 (×2): qty 0.5

## 2011-10-10 NOTE — Progress Notes (Signed)
[facility] PROGRESS NOTE  Beth [name] ZOX:[id number] DOB: 15-Jul-1960 DOA: 10/08/2011 PCP: [name], MD Oncology: Dr. Arbutus Ped  Assessment/Plan: Principal Problem:  *Somnolence, likely from decreased renal clearance of pain medication in the setting of ARF / hypoxia  Hypoxia likely from respiratory depression.  Improved with supplemental oxygen.  Mental status improved.  Oxygen saturations 92-100% overnight.  Flexaril, MS Contin, MSIR placed on hold. Active Problems:  Tachycardia  Need to rule out PE given history of malignancy.  Check D-Dimer.  If elevated, will get CT chest.  Elevated BNP  Likely reflective of ARF.  CXR not consistent with significant pulmonary edema.  Essential hypertension, benign  HCTZ on hold secondary to ARF.  BP stable.  GERD  Continue PPI.  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  On iron supplement at home (currently on hold).  Baseline hemoglobin 7-8.0 mg/dL.  Stage IV adenocarcinoma of lung  Receiving [name] at home.  [name] currently on hold.  Leukocytosis  U/A clear.  CXR not indicative of PNA.  Normalized, likely demargination from stress reaction.  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  Improved with hydration.  Acute renal failure with hyperphosphatemia  May be from [name].  [name] and HCTZ on hold.  Renal ultrasound negative for obstruction.  Creatinine back to normal.  KVO IVF.  Dr. Myna Hidalgo consulting.  Thrombocytosis  Likely reactive.  Transaminitis  Likely a side effect of tarceva.  No obvious liver metastasis noted on PET scan.  Improving.   Code Status: Full Family Communication: Spoke with family 10/09/11.  No one at bedside today.   Disposition Plan: Home when stable.   Brief narrative: [name] is a 51 year old woman with newly diagnosed stage IV adenocarcinoma of the lung (started radiation treatment on 09/29/11) who was found to have ARF when routine blood work  done at the [facility] on 10/07/11 revealed a creatinine of 3.39 (baseline creatinine 0.5-0.6).  [facility], and she ultimately was brought to the hospital by her family due to somnolence.  Upon initial evaluation in the ER, she had oxygen saturations in the 70's.  Medical Consultants:  Dr. Myna Hidalgo, Oncology  Other Consultants:  Physical therapy  Occupational therapy  Procedures:  None  Antibiotics:  None  HPI/Subjective: [name] is complaining of dyspnea.  Had wheezing earlier, and was given a bronchodilator treatment with improved symptoms.  Some pleuritic chest pain.  Wants foley out.  Asking when she can go home.    Objective: Filed Vitals:   10/09/11 2000 10/09/11 2300 10/10/11 0545 10/10/11 1129  BP: 109/69 131/83 100/68   Pulse: 110 110 102   Temp: 98.2 F (36.8 C) 98.2 F (36.8 C) 97.6 F (36.4 C)   TempSrc: Oral Oral Oral   Resp: 24 20 18    Height:  5\' 6"  (1.676 m)    Weight:  106.2 kg (234 lb 2.1 oz) 106.5 kg (234 lb 12.6 oz)   SpO2: 96% 92% 100% 95%    Intake/Output Summary (Last 24 hours) at 10/10/11 1147 Last data filed at 10/10/11 0700  Gross per 24 hour  Intake   2670 ml  Output   1975 ml  Net    695 ml    Exam: Gen:  More alert. Cardiovascular:  Tachy, No M/R/G Respiratory: Lungs diminished in the bases. Gastrointestinal: Abdomen soft, NT/ND with normal active bowel sounds. Extremities: No C/E/C  Data Reviewed: Basic Metabolic Panel:  Lab 10/10/11 8119 10/09/11 0533 10/08/11  1648 10/07/11 0933 10/04/11 0829  NA 134* 130* 126* 131* 131*  K 3.9 4.4 -- -- --  CL 97 90* 84* 92* 94*  CO2 25 24 22 24 26   GLUCOSE 87 109* 116* 142* 120*  BUN 30* 57* 52* 26* 12  CREATININE 0.62 2.55* 4.69* 3.39* 0.56  CALCIUM 8.5 8.8 8.8 8.5 9.0  MG -- -- 2.3 -- --  PHOS -- -- 7.1* -- --   GFR Estimated Creatinine Clearance: 103.9 ml/min (by C-G formula based on Cr of 0.62). Liver Function Tests:  Lab 10/10/11 0538  10/09/11 0533 10/08/11 1648 10/07/11 0933  AST 70* 175* 204* 21  ALT 80* 109* 83* <8  ALKPHOS 80 90 96 87  BILITOT 0.3 0.3 0.4 0.4  PROT 6.9 7.8 8.4* 7.5  ALBUMIN 2.3* 2.9* 3.2* 3.7   Coagulation profile  Lab 10/08/11 1648  INR 1.24  PROTIME --    CBC:  Lab 10/10/11 0538 10/08/11 1648 10/07/11 0933 10/04/11 0829  WBC 9.4 15.1* 14.3* 10.5  NEUTROABS -- 11.9* 11.1* 7.4  HGB 7.1* 8.0* 7.9* 8.2*  HCT 22.8* 25.5* 25.2* 26.7*  MCV 83.5 82.0 83.0 82.9  PLT 437* 588* 552* 547*   Cardiac Enzymes:  Lab 10/08/11 1648 10/04/11 0829  CKTOTAL -- 24  CKMB -- 1.0  CKMBINDEX -- --  TROPONINI <0.30 <0.30   BNP (last 3 results)  Basename 10/08/11 1648  PROBNP 17339.0*   Microbiology Recent Results (from the past 240 hour(s))  TECHNOLOGIST REVIEW     Status: Normal   Collection Time   10/07/11  9:33 AM      Component Value Range Status Comment   Technologist Review 3 %nRBC   Final    Urinalysis    Component Value Date/Time   COLORURINE YELLOW 10/08/2011 1654   APPEARANCEUR CLOUDY* 10/08/2011 1654   LABSPEC 1.024 10/08/2011 1654   PHURINE 5.0 10/08/2011 1654   GLUCOSEU NEGATIVE 10/08/2011 1654   HGBUR NEGATIVE 10/08/2011 1654   BILIRUBINUR SMALL* 10/08/2011 1654   KETONESUR NEGATIVE 10/08/2011 1654   PROTEINUR 30* 10/08/2011 1654   UROBILINOGEN 1.0 10/08/2011 1654   NITRITE NEGATIVE 10/08/2011 1654   LEUKOCYTESUR NEGATIVE 10/08/2011 1654      Studies:  US Renal 10/08/2011 IMPRESSION: Negative renal ultrasound.  No hydronephrosis.  Original Report Authenticated By: Charline Bills, M.D.    Dg Chest Port 1 View 10/08/2011 IMPRESSION: 1.  Complete opacification of the right hemithorax, similar to the appearance from CT scan for days ago.  2.  Bowel gas projects to the right of the heart, compatible with the known large right anterior diaphragmatic hernia.  3.  Vague densities in the left lung correspond to previously seen pulmonary nodules.  Original Report Authenticated By: Dellia Cloud, M.D.   Scheduled Meds:    . albuterol  2.5 mg Nebulization Q6H  . ARIPiprazole  5 mg Oral Daily  . heparin  5,000 Units Subcutaneous Q8H  . ipratropium  0.5 mg Nebulization Q6H  . lamoTRIgine  25 mg Oral Daily  . pantoprazole  40 mg Oral Daily  . venlafaxine XR  150 mg Oral Daily   Continuous Infusions:    . sodium chloride 125 mL/hr at 10/10/11 1141    Time spent: 35 minutes.   LOS: 2 days   Rian Busche  [facility] Pager [phone].  If 8PM-8AM, please contact night-coverage at [email or web address], password [facility] 10/10/2011, 11:47 AM

## 2011-10-10 NOTE — Consult Note (Signed)
NAME:  Beth [name], Teliah             ACCOUNT NO.:  [id number]  MEDICAL RECORD NO.:  [id number]  LOCATION:  1417                         FACILITY:  [facility]  PHYSICIAN:  Josph Macho, M.D.  DATE OF BIRTH:  1960-04-01  DATE OF CONSULTATION: DATE OF DISCHARGE:                                CONSULTATION   REFERRING PHYSICIAN:  Salome Arnt, MD  REASON FOR CONSULTATION: 1. Metastatic adenocarcinoma of the lung, EGFR positive. 2. New-onset renal failure.  HISTORY OF PRESENT ILLNESS:  Ms. Beth [name] is a 51 year old African- American female.  She is followed by Dr. Gwenyth Bouillon.  She has metastatic adenocarcinoma of the lung.  She presented back in June.  She was found to have EGFR positivity.  She subsequently has been started on [name] at 150 mg.  She is undergoing palliative radiation therapy for right lung obstruction.  She comes in obtunded.  She was admitted on October 08, 2011.  She was found to have renal failure.  Her BUN and creatinine were 52 and 4.69. On October 04, 2011, her BUN was 12, creatinine 0.56.  She really cannot say much to me as she is obtunded.  She likely has some narcotic over dose because of decreased excretion because of her kidney failure.  She did have a renal ultrasound done.  This did not show any evidence of obstruction.  We were asked to see her since she is Dr. Sharlene Dory patient to help provide any input as to management.  PAST MEDICAL HISTORY:  Remarkable for asthma, GERD, hypertension, fibromyalgia, arthritis, anxiety, and depression.  ALLERGIES:  MELOXICAM.  HOME MEDICATIONS:  Abilify 5 mg daily, Flexeril 5 mg daily p.r.n., [name] 150 mg p.o. daily, Nexium 40 mg p.o. daily, folic acid 1 mg p.o. daily, hydrochlorothiazide 25 mg daily, Lorcet (5/500) 1 p.o. q.6 h. p.r.n., Lamictal 25 mg daily, MS Contin 15 mg p.o. b.i.d., MSIR 15 mg q.4 h. p.r.n., Compazine 10 mg p.o. q.6 h. p.r.n., venlafaxine 150 mg p.o. daily, Percocet (5/325) two p.o. q.4 h.  p.r.n.  SOCIAL HISTORY:  Remarkable for tobacco use.  She smokes 1 pack per day. There is no alcohol use.  There is no obvious occupational exposures.  PHYSICAL EXAMINATION:  GENERAL:  This is an obtunded black female in no obvious distress. VITAL SIGNS:  97.6, pulse 86, respiratory rate 11, blood pressure 121/66, and oxygen saturation is 96% on room air. HEAD AND NECK:  Normocephalic and atraumatic skull.  No ocular or oral lesions.  She has no palpable cervical or supraclavicular lymph nodes. LUNGS:  Decreased breath sounds on the right side.  Left side is relatively clear. CARDIAC:  Tachycardic but regular.  There are no murmurs, rubs, or bruits. ABDOMEN:  Soft with good bowel sounds.  There is no palpable abdominal mass.  There is no palpable hepatosplenomegaly. EXTREMITIES:  No clubbing, cyanosis, or edema. NEUROLOGICAL:  Again shows somewhat of obtunded patient.  LABORATORY STUDIES:  White cell count of 15, hemoglobin 8, hematocrit 25.5, and platelet count 588.  Sodium 138, potassium 4.4, BUN 57, and creatinine 2.55.  Calcium 8.8.  Liver function tests show SGPT of 5, SGOT 175.  IMPRESSION:  Ms. Beth [name] is a 51 year old African-American female  with metastatic adenocarcinoma of the lung.  She is the EGFR positive.  It is really hard to tell when she actually started [name].  It sounds like this was started just yesterday.  I have a hard time believing that this is the cause of her renal failure.  I know there is report of acute renal failure, and hepatorenal syndrome with [name].  I am unsure of the time frame before this begins.  It looks like her renal function is improving.  As such, I would just hold on the [name] for now.  I suppose that given that she is EGFR positive, one would really like to pursue targeted therapy.  One might be able to decrease the dose of [name] and see how she tolerates this.  Again, it seems like she has only had one dose, that of  [name].  We will certainly follow her along.     Josph Macho, M.D.     PRE/MEDQ  D:  10/09/2011  T:  10/10/2011  Job:  [id number]

## 2011-10-10 NOTE — Consult Note (Signed)
NAME:  Beth [name], Beth [name]             ACCOUNT NO.:  [id number]  MEDICAL RECORD NO.:  [id number]  LOCATION:  1417                         FACILITY:  [facility]  PHYSICIAN:  Josph Macho, M.D.  DATE OF BIRTH:  1960/07/24  DATE OF CONSULTATION: DATE OF DISCHARGE:                                CONSULTATION   REFERRING PHYSICIAN:  Salome Arnt.  REASON FOR CONSULTATION:  Metastatic adenocarcinoma of the lung.  Dictation ended at this point.     Josph Macho, M.D.     PRE/MEDQ  D:  10/09/2011  T:  10/09/2011  Job:  [id number]

## 2011-10-11 ENCOUNTER — Ambulatory Visit
Admission: RE | Admit: 2011-10-11 | Discharge: 2011-10-11 | Disposition: A | Discharge status: Home / Self Care | Payer: 59 | Source: Ambulatory Visit | Attending: Radiation Oncology | Admitting: Radiation Oncology

## 2011-10-11 ENCOUNTER — Telehealth: Payer: Self-pay | Admitting: Medical Oncology

## 2011-10-11 ENCOUNTER — Ambulatory Visit: Payer: 59 | Admitting: Internal Medicine

## 2011-10-11 ENCOUNTER — Inpatient Hospital Stay (HOSPITAL_COMMUNITY): Payer: 59

## 2011-10-11 ENCOUNTER — Other Ambulatory Visit: Payer: 59 | Admitting: Lab

## 2011-10-11 ENCOUNTER — Encounter: Payer: Self-pay | Admitting: *Deleted

## 2011-10-11 ENCOUNTER — Ambulatory Visit: Admission: RE | Admit: 2011-10-11 | Payer: 59 | Source: Ambulatory Visit | Admitting: Radiation Oncology

## 2011-10-11 DIAGNOSIS — K5900 Constipation, unspecified: Secondary | ICD-10-CM | POA: Diagnosis present

## 2011-10-11 DIAGNOSIS — D649 Anemia, unspecified: Secondary | ICD-10-CM

## 2011-10-11 DIAGNOSIS — E860 Dehydration: Secondary | ICD-10-CM

## 2011-10-11 DIAGNOSIS — C3490 Malignant neoplasm of unspecified part of unspecified bronchus or lung: Secondary | ICD-10-CM

## 2011-10-11 LAB — ABO/RH: ABO/RH(D): O POS

## 2011-10-11 MED ORDER — IPRATROPIUM BROMIDE 0.02 % IN SOLN
0.5000 mg | Freq: Four times a day (QID) | RESPIRATORY_TRACT | Status: DC | PRN
  Administered 2011-10-11 – 2011-10-12 (×3): 0.5 mg via RESPIRATORY_TRACT
  Filled 2011-10-11 (×3): qty 2.5

## 2011-10-11 MED ORDER — ALBUTEROL SULFATE (5 MG/ML) 0.5% IN NEBU
2.5000 mg | INHALATION_SOLUTION | Freq: Four times a day (QID) | RESPIRATORY_TRACT | Status: DC | PRN
  Administered 2011-10-11 – 2011-10-12 (×3): 2.5 mg via RESPIRATORY_TRACT
  Filled 2011-10-11 (×3): qty 0.5

## 2011-10-11 MED ORDER — DIPHENHYDRAMINE HCL 50 MG/ML IJ SOLN
25.0000 mg | Freq: Once | INTRAMUSCULAR | Status: AC
  Administered 2011-10-11: 25 mg via INTRAVENOUS
  Filled 2011-10-11: qty 1

## 2011-10-11 MED ORDER — HYDROCOD POLST-CHLORPHEN POLST 10-8 MG/5ML PO LQCR
5.0000 mL | Freq: Two times a day (BID) | ORAL | Status: DC | PRN
  Administered 2011-10-11: 5 mL via ORAL
  Filled 2011-10-11: qty 5

## 2011-10-11 MED ORDER — ERLOTINIB HCL 150 MG PO TABS
150.0000 mg | ORAL_TABLET | Freq: Every day | ORAL | Status: DC
  Administered 2011-10-12 – 2011-10-14 (×3): 150 mg via ORAL
  Filled 2011-10-11 (×4): qty 1

## 2011-10-11 MED ORDER — ACETAMINOPHEN 325 MG PO TABS
650.0000 mg | ORAL_TABLET | Freq: Once | ORAL | Status: AC
  Administered 2011-10-11: 650 mg via ORAL
  Filled 2011-10-11: qty 2

## 2011-10-11 MED ORDER — IOHEXOL 350 MG/ML SOLN
100.0000 mL | Freq: Once | INTRAVENOUS | Status: AC | PRN
  Administered 2011-10-11: 100 mL via INTRAVENOUS

## 2011-10-11 MED ORDER — POLYETHYLENE GLYCOL 3350 17 G PO PACK
17.0000 g | PACK | Freq: Every day | ORAL | Status: DC
  Administered 2011-10-11 – 2011-10-13 (×3): 17 g via ORAL
  Filled 2011-10-11 (×3): qty 1

## 2011-10-11 MED ORDER — FUROSEMIDE 10 MG/ML IJ SOLN
40.0000 mg | Freq: Once | INTRAMUSCULAR | Status: AC
  Administered 2011-10-11: 40 mg via INTRAVENOUS
  Filled 2011-10-11: qty 4

## 2011-10-11 NOTE — Progress Notes (Signed)
Weekly Management Note:  Site:R Lung Current Dose:  720  cGy Projected Dose: 2700  cGy  Narrative: The patient is seen today for routine under treatment assessment. CBCT/MVCT images/port films were reviewed. The chart was reviewed.   She was hospitalized for uncontrolled chest pain along with excessive use of narcotics resulting in hypertension and renal failure. She start [name] last week. She remains hospitalized. She still has right-sided chest pain felt to be secondary to her primary tumor with right lung atelectasis.  Physical Examination: There were no vitals filed for this visit..  Weight:  . Decreased breath sounds along the right hemithorax. Left lung clear.  Impression: Tolerating radiation therapy well.  Plan: Continue radiation therapy as planned.

## 2011-10-11 NOTE — Progress Notes (Signed)
Patient ID: Beth [name], female   DOB: Jan 11, 1961, 51 y.o.   MRN: [id number] Subjective: Feeling better this morning. Denied any bleeding. Pain under good control. Objective: Vital signs in last 24 hours: Temp:  [97.4 F (36.3 C)-98.6 F (37 C)] 97.4 F (36.3 C) (07/22 0621) Pulse Rate:  [104-123] 104  (07/22 0621) Resp:  [20-22] 20  (07/22 0621) BP: (108-150)/(64-91) 108/64 mmHg (07/22 0621) SpO2:  [95 %-100 %] 100 % (07/22 0621) Weight:  [105.3 kg (232 lb 2.3 oz)] 105.3 kg (232 lb 2.3 oz) (07/22 7829)  Intake/Output from previous day: 07/21 0701 - 07/22 0700 In: 280 [P.O.:120; I.V.:160] Out: 300 [Urine:300] Intake/Output this shift:    General appearance: alert, cooperative and no distress Resp: clear to auscultation bilaterally Cardio: regular rate and rhythm, S1, S2 normal, no murmur, click, rub or gallop GI: soft, non-tender; bowel sounds normal; no masses,  no organomegaly Extremities: extremities normal, atraumatic, no cyanosis or edema  Lab Results:   Basename 10/10/11 0538 10/08/11 1648  WBC 9.4 15.1*  HGB 7.1* 8.0*  HCT 22.8* 25.5*  PLT 437* 588*   BMET  Basename 10/10/11 0538 10/09/11 0533  NA 134* 130*  K 3.9 4.4  CL 97 90*  CO2 25 24  GLUCOSE 87 109*  BUN 30* 57*  CREATININE 0.62 2.55*  CALCIUM 8.5 8.8    Studies/Results: No results found.  Medications: I have reviewed the patient's current medications.  Assessment/Plan: Metastatic adenocarcinoma with positive EGFR mutation admitted with acute renal failure  (ARF) likely secondary to dehydration. The ARF has resolved with IVF hydration. Her anemia has worsened, recommend transfusing 2 units PRBCs prior to discharge home. Patient to resume treatment with [name] as prescribed by Dr.Kerrianne Jeng at discharge with outpatient follow up as previousyl scheduled.  LOS: 3 days    Marlana Salvage 10/11/2011 9:00 AM  Hematology/oncology attending: The patient is seen and examined today. She is  feeling much better. Her renal function has significantly improved. She still have anemia, will consider her for 2 units of packed rbc's transfusion. Patient will resume her treatment with [name]. She may be ready for discharge within the next 1-2 days.

## 2011-10-11 NOTE — Evaluation (Signed)
Physical Therapy Evaluation Patient Details Name: Beth [name] MRN: [id number] DOB: 09-03-1960 Today's Date: 10/11/2011 Time: 1478-2956 PT Time Calculation (min): 24 min  PT Assessment / Plan / Recommendation Clinical Impression  51 y.o. female with h/o of lung Cancer admitted with somnolence, ARF. Pt reports feeling SOB without O2. SaO2 92% on RA, 99% on 3 L O2. HR 119 with walking, 109 at rest. Pt walked 28' with min/guard assist for balance, with 3L O2. HHPT recommended to address balance/endurance. Pt would benefit from acute PT to maximize safety and independence with mobility.    PT Assessment  Patient needs continued PT services    Follow Up Recommendations  Home health PT    Barriers to Discharge None      Equipment Recommendations  None recommended by PT    Recommendations for Other Services OT consult   Frequency Min 3X/week    Precautions / Restrictions     Pertinent Vitals/Pain *At rest SaO2 92% on RA, HR 109; SaO2 99% on 3L O2 After walking SaO2 96% on 3L O2, HR 119 No pain**      Mobility  Bed Mobility Bed Mobility: Supine to Sit Supine to Sit: 6: Modified independent (Device/Increase time);With rails;HOB elevated Transfers Transfers: Sit to Stand;Stand to Sit Sit to Stand: 4: Min guard;From bed Stand to Sit: 4: Min guard;To chair/3-in-1 Details for Transfer Assistance: min/guard for safety due to low Hgb, pt had LOB after standing from bed-she leaned to R and sat down on bed-she denied dizziness, and stated that the O2 tank had been in her way Ambulation/Gait Ambulation/Gait Assistance: 4: Min guard Ambulation Distance (Feet): 28 Feet Assistive device: None Ambulation/Gait Assistance Details: min/guard for mild unsteadiness Gait Pattern: Within Functional Limits;Lateral trunk lean to left;Lateral trunk lean to right    Exercises     PT Diagnosis: Generalized weakness  PT Problem List: Decreased mobility;Cardiopulmonary status limiting activity PT  Treatment Interventions: Gait training;Functional mobility training;Balance training;Patient/family education   PT Goals Acute Rehab PT Goals PT Goal Formulation: With patient Time For Goal Achievement: 10/25/11 Potential to Achieve Goals: Good Pt will go Sit to Stand: with modified independence;with upper extremity assist PT Goal: Sit to Stand - Progress: Goal set today Pt will Ambulate: 51 - 150 feet;with modified independence PT Goal: Ambulate - Progress: Goal set today Pt will Go Up / Down Stairs: 6-9 stairs;with supervision PT Goal: Up/Down Stairs - Progress: Goal set today  Visit Information  Last PT Received On: 10/11/11 Assistance Needed: +1    Subjective Data  Subjective: I'm sleepy.  Patient Stated Goal: go stay with sister in [place]   Prior Functioning  Home Living Lives With: Alone Available Help at Discharge: Family (staying with family in [place].) Type of Home: House Home Access: Level entry Home Layout: Two level Alternate Level Stairs-Number of Steps: flight of stairs. Alternate Level Stairs-Rails: Right;Left;Can reach both Bathroom Shower/Tub: Tub/shower unit Home Adaptive Equipment: None Prior Function Level of Independence: Independent Driving: Yes Vocation: Retired Musician: No difficulties Dominant Hand: Right    Cognition  Overall Cognitive Status: Appears within functional limits for tasks assessed/performed Area of Impairment: Attention Orientation Level: Appears intact for tasks assessed Behavior During Session: [facility] for tasks performed    Extremity/Trunk Assessment Right Upper Extremity Assessment RUE ROM/Strength/Tone: [facility] for tasks assessed Left Upper Extremity Assessment LUE ROM/Strength/Tone: WFL for tasks assessed Right Lower Extremity Assessment RLE ROM/Strength/Tone: WFL for tasks assessed RLE Sensation: WFL - Light Touch RLE Coordination: WFL - gross/fine motor Left Lower  Extremity Assessment LLE  ROM/Strength/Tone: WFL for tasks assessed LLE Sensation: WFL - Light Touch LLE Coordination: WFL - gross/fine motor Trunk Assessment Trunk Assessment: Normal   Balance Balance Balance Assessed: Yes Static Sitting Balance Static Sitting - Balance Support: Feet supported;Bilateral upper extremity supported Static Sitting - Level of Assistance: 6: Modified independent (Device/Increase time) Static Sitting - Comment/# of Minutes: 3  End of Session PT - End of Session Equipment Utilized During Treatment: Gait belt;Oxygen Activity Tolerance: Patient limited by fatigue Patient left: in chair;with call bell/phone within reach Nurse Communication: Mobility status  GP     Ralene Bathe Kistler 10/11/2011, 11:04 AM  [phone]

## 2011-10-11 NOTE — Care Management Note (Addendum)
    Page 1 of 2   10/14/2011     12:32:12 PM   CARE MANAGEMENT NOTE 10/14/2011  Patient:  Beth [name], Beth [name]   Account Number:  [id number]  Date Initiated:  10/11/2011  Documentation initiated by:  Lanier Clam  Subjective/Objective Assessment:   ADMITTED W/SOMNOLENCE.RECENT ZO:XWRU CA.     Action/Plan:   FROM HOME   Anticipated DC Date:  10/14/2011   Anticipated DC Plan:  HOME W HOME HEALTH SERVICES  In-house referral  Clinical Social Worker      DC Planning Services  CM consult      Choice offered to / List presented to:  C-1 Patient   DME arranged  [facility]      DME agency  [facility]     HH arranged  HH-2 PT      [facility] agency  [facility]   Status of service:  Completed, signed off Medicare Important Message given?   (If response is "NO", the following Medicare IM given date fields will be blank) Date Medicare IM given:   Date Additional Medicare IM given:    Discharge Disposition:  HOME W HOME HEALTH SERVICES  Per UR Regulation:  Reviewed for med. necessity/level of care/duration of stay  If discussed at Long Length of Stay Meetings, dates discussed:    Comments:  10/14/11 Feliza Diven RN,BSN NCM [phone] NOW D/C PLAN HOME W/HH.[facility]) INFORMED OF D/C FOR HHPT/HOME 02.  10/12/11 Angelos Wasco RN,BSN NCM [phone] DESATING 70'S. NOW D/C PLAN SNF.  10/11/11 Fredrico Beedle RN,BSN NCM [phone] PROVIDED W/HHC AGENCY LIST .PT/OT-HH.WILL NEED HH ORDERS IF MD AGREE.

## 2011-10-11 NOTE — Progress Notes (Signed)
In pt lying in bed getting blood transfusion.  No pain or c/o at this time.  Will return to floor after dr. Dayton Scrape sees her

## 2011-10-11 NOTE — Evaluation (Signed)
Occupational Therapy Evaluation Patient Details Name: Beth [name] MRN: [id number] DOB: Aug 27, 1960 Today's Date: 10/11/2011 Time: 9562-1308 OT Time Calculation (min): 26 min  OT Assessment / Plan / Recommendation Clinical Impression  Pt is a 51 yo female who presents with h/o of lung Cancer admitted with somnolence, ARF. Pt reports feeling SOB without O2. SaO2 92% on RA, 99% on 3 L O2. HR 119 with walking, 109 at rest. Skilled OT indicated to maximize I w/BADLs to mod I level in prep for d/c home. Pt will likely not need f/u OT. TBD based on progress.    OT Assessment  Patient needs continued OT Services    Follow Up Recommendations  No OT follow up;Home health OT    Barriers to Discharge      Equipment Recommendations  None recommended by OT    Recommendations for Other Services    Frequency  Min 2X/week    Precautions / Restrictions Precautions Precautions: Fall   Pertinent Vitals/Pain See above    ADL  Grooming: Performed;Wash/dry hands;Min guard Where Assessed - Grooming: Unsupported standing Upper Body Bathing: Simulated;Set up Where Assessed - Upper Body Bathing: Unsupported sitting Lower Body Bathing: Simulated;Min guard Where Assessed - Lower Body Bathing: Unsupported sit to stand Upper Body Dressing: Simulated;Set up Where Assessed - Upper Body Dressing: Unsupported sitting Lower Body Dressing: Simulated;Min guard Where Assessed - Lower Body Dressing: Unsupported sit to stand Toilet Transfer: Performed;Min guard Toilet Transfer Method: Sit to Barista: Regular height toilet Toileting - Clothing Manipulation and Hygiene: Performed;Min guard Where Assessed - Engineer, mining and Hygiene: Sit to stand from 3-in-1 or toilet Equipment Used: Gait belt Transfers/Ambulation Related to ADLs: Limited ambulation around the room only due to low hgb. Pt appeared woozy, lethargic thoughout but stated this was "normal" and that she did  not feel dizzy.    OT Diagnosis: Generalized weakness  OT Problem List: Decreased activity tolerance;Decreased safety awareness;Decreased knowledge of use of DME or AE;Impaired balance (sitting and/or standing) OT Treatment Interventions: Self-care/ADL training;Therapeutic activities;DME and/or AE instruction;Patient/family education;Balance training   OT Goals Acute Rehab OT Goals OT Goal Formulation: With patient Time For Goal Achievement: 10/25/11 Potential to Achieve Goals: Good ADL Goals Pt Will Perform Grooming: with modified independence;Standing at sink ADL Goal: Grooming - Progress: Goal set today Pt Will Perform Upper Body Dressing: with modified independence;Unsupported;Sitting, chair;Sitting, bed ADL Goal: Upper Body Dressing - Progress: Goal set today Pt Will Perform Lower Body Dressing: with modified independence;Sit to stand from chair;Sit to stand from bed ADL Goal: Lower Body Dressing - Progress: Goal set today Pt Will Transfer to Toilet: with modified independence;Regular height toilet;Ambulation ADL Goal: Toilet Transfer - Progress: Goal set today Pt Will Perform Toileting - Clothing Manipulation: with modified independence;Standing ADL Goal: Toileting - Clothing Manipulation - Progress: Goal set today Pt Will Perform Toileting - Hygiene: with modified independence;Sit to stand from 3-in-1/toilet ADL Goal: Toileting - Hygiene - Progress: Goal set today Pt Will Perform Tub/Shower Transfer: Tub transfer;with modified independence ADL Goal: Tub/Shower Transfer - Progress: Goal set today  Visit Information  Last OT Received On: 10/11/11 Assistance Needed: +1 PT/OT Co-Evaluation/Treatment: Yes    Subjective Data  Subjective: I'm wobbly cause you girls keep holding onto me. Patient Stated Goal: Go to [place] to stay with family prior to hospitalization.   Prior Functioning  Vision/Perception  Home Living Lives With: Alone Available Help at Discharge: Family  (staying with family in [place].) Type of Home: House Home Access: Level entry  Home Layout: Two level Alternate Level Stairs-Number of Steps: flight of stairs. Alternate Level Stairs-Rails: Right;Left;Can reach both Bathroom Shower/Tub: Engineer, manufacturing systems: Standard Home Adaptive Equipment: None Prior Function Level of Independence: Independent Driving: Yes Vocation: Retired Musician: No difficulties Dominant Hand: Right      Cognition  Overall Cognitive Status: Appears within functional limits for tasks assessed/performed Area of Impairment: Attention Orientation Level: Appears intact for tasks assessed Behavior During Session: [facility] for tasks performed    Extremity/Trunk Assessment Right Upper Extremity Assessment RUE ROM/Strength/Tone: [facility] for tasks assessed Left Upper Extremity Assessment LUE ROM/Strength/Tone: WFL for tasks assessed Right Lower Extremity Assessment RLE ROM/Strength/Tone: WFL for tasks assessed RLE Sensation: WFL - Light Touch RLE Coordination: WFL - gross/fine motor Left Lower Extremity Assessment LLE ROM/Strength/Tone: WFL for tasks assessed LLE Sensation: WFL - Light Touch LLE Coordination: WFL - gross/fine motor Trunk Assessment Trunk Assessment: Normal   Mobility Bed Mobility Bed Mobility: Supine to Sit Supine to Sit: 6: Modified independent (Device/Increase time);With rails;HOB elevated Transfers Sit to Stand: 4: Min guard;From bed Stand to Sit: 4: Min guard;To chair/3-in-1 Details for Transfer Assistance: min/guard for safety due to low Hgb, pt had LOB after standing from bed-she leaned to R and sat down on bed-she denied dizziness, and stated that the O2 tank had been in her way   Exercise    Balance Balance Balance Assessed: Yes Static Sitting Balance Static Sitting - Balance Support: Feet supported;Bilateral upper extremity supported Static Sitting - Level of Assistance: 6: Modified independent  (Device/Increase time) Static Sitting - Comment/# of Minutes: 3  End of Session OT - End of Session Equipment Utilized During Treatment: Gait belt Activity Tolerance: Patient limited by fatigue Patient left: in chair;with call bell/phone within reach  GO     Tannon Peerson A OTR/L [phone] 10/11/2011, 11:15 AM

## 2011-10-11 NOTE — Telephone Encounter (Signed)
Mother called and asked if pt was treated today. I told her mother that she received radiation today.

## 2011-10-11 NOTE — Telephone Encounter (Signed)
Mother asking about radiation treatments I transferred her call to XRT.

## 2011-10-11 NOTE — Progress Notes (Signed)
[facility] PROGRESS NOTE  Beth [name] ZOX:[id number] DOB: 17-Aug-1960 DOA: 10/08/2011 PCP: [name], MD Oncology: Dr. Arbutus Ped  Assessment/Plan: Principal Problem:  *Somnolence, likely from decreased renal clearance of pain medication in the setting of ARF / hypoxia  Hypoxia likely from respiratory depression.  Improved with supplemental oxygen.  Mental status improved.  Oxygen saturations 92-100% overnight.  Flexaril, MS Contin, MSIR placed on hold. Active Problems:  Constipation  Start Miralax.  Tachycardia  Need to rule out PE given history of malignancy.  D-Dimer was elevated, CT chest then done, was negative for PE.  Elevated BNP  Likely reflective of ARF.  CXR not consistent with significant pulmonary edema.  Essential hypertension, benign  HCTZ on hold secondary to ARF.  BP stable.  GERD  Continue PPI.  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  On iron supplement at home (currently on hold).  Baseline hemoglobin 7-8.0 mg/dL.  2 units of PRBCs ordered by oncologist for today.  Stage IV adenocarcinoma of lung  Receiving [name] at home.  [name] currently on hold.  Can resume at discharge, per oncology.  Leukocytosis  U/A clear.  CXR not indicative of PNA.  Normalized, likely demargination from stress reaction.  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  Improved with hydration.  Acute renal failure with hyperphosphatemia  May be from [name].  [name] and HCTZ on hold.  Renal ultrasound negative for obstruction.  Creatinine back to normal.  KVO IVF.  Seen by oncology while in hospital.  Thrombocytosis  Likely reactive.  Transaminitis  Likely a side effect of tarceva.  No obvious liver metastasis noted on PET scan.  Improving.   Code Status: Full Family Communication: Spoke with family 10/09/11.  No one at bedside today.   Disposition Plan: Home when stable.   Brief narrative: [name] is a 51 year old  woman with newly diagnosed stage IV adenocarcinoma of the lung (started radiation treatment on 09/29/11) who was found to have ARF when routine blood work done at the [facility] on 10/07/11 revealed a creatinine of 3.39 (baseline creatinine 0.5-0.6).  [facility], and she ultimately was brought to the hospital by her family due to somnolence.  Upon initial evaluation in the ER, she had oxygen saturations in the 70's.  Medical Consultants:  Dr. Myna Hidalgo and Dr. Arbutus Ped, Oncology  Other Consultants:  Physical therapy: Home PT.  Occupational therapy: Home OT.  Procedures:  2 units of PRBCs on 10/11/11.  Antibiotics:  None  HPI/Subjective: [name] is complaining of dyspnea.  Had wheezing earlier, and was given a bronchodilator treatment with improved symptoms.  Some pleuritic chest pain.  Wants foley out.  Asking when she can go home.    Objective: Filed Vitals:   10/10/11 1129 10/10/11 1400 10/11/11 0621 10/11/11 1054  BP:  150/91 108/64   Pulse:  123 104 109  Temp:  98.6 F (37 C) 97.4 F (36.3 C)   TempSrc:  Oral Oral   Resp:  22 20   Height:      Weight:   105.3 kg (232 lb 2.3 oz)   SpO2: 95% 97% 100% 92%    Intake/Output Summary (Last 24 hours) at 10/11/11 1220 Last data filed at 10/11/11 0600  Gross per 24 hour  Intake    160 ml  Output    300 ml  Net   -140 ml    Exam: Gen:  More alert. Cardiovascular:  Tachy, No M/R/G Respiratory: Lungs  diminished in the bases, some scattered high pitched wheezes Gastrointestinal: Abdomen soft, NT/ND with normal active bowel sounds. Extremities: No C/E/C  Data Reviewed: Basic Metabolic Panel:  Lab 10/10/11 5409 10/09/11 0533 10/08/11 1648 10/07/11 0933  NA 134* 130* 126* 131*  K 3.9 4.4 -- --  CL 97 90* 84* 92*  CO2 25 24 22 24   GLUCOSE 87 109* 116* 142*  BUN 30* 57* 52* 26*  CREATININE 0.62 2.55* 4.69* 3.39*  CALCIUM 8.5 8.8 8.8 8.5  MG -- -- 2.3 --  PHOS -- -- 7.1* --    GFR Estimated Creatinine Clearance: 103.2 ml/min (by C-G formula based on Cr of 0.62). Liver Function Tests:  Lab 10/10/11 0538 10/09/11 0533 10/08/11 1648 10/07/11 0933  AST 70* 175* 204* 21  ALT 80* 109* 83* <8  ALKPHOS 80 90 96 87  BILITOT 0.3 0.3 0.4 0.4  PROT 6.9 7.8 8.4* 7.5  ALBUMIN 2.3* 2.9* 3.2* 3.7   Coagulation profile  Lab 10/08/11 1648  INR 1.24  PROTIME --    CBC:  Lab 10/10/11 0538 10/08/11 1648 10/07/11 0933  WBC 9.4 15.1* 14.3*  NEUTROABS -- 11.9* 11.1*  HGB 7.1* 8.0* 7.9*  HCT 22.8* 25.5* 25.2*  MCV 83.5 82.0 83.0  PLT 437* 588* 552*   Cardiac Enzymes:  Lab 10/08/11 1648  CKTOTAL --  CKMB --  CKMBINDEX --  TROPONINI <0.30   BNP (last 3 results)  Basename 10/08/11 1648  PROBNP 17339.0*   Microbiology Recent Results (from the past 240 hour(s))  TECHNOLOGIST REVIEW     Status: Normal   Collection Time   10/07/11  9:33 AM      Component Value Range Status Comment   Technologist Review 3 %nRBC   Final   URINE CULTURE     Status: Normal   Collection Time   10/08/11  4:54 PM      Component Value Range Status Comment   Specimen Description URINE, CATHETERIZED   Final    Special Requests NONE   Final    Culture  Setup Time 10/09/2011 01:23   Final    Colony Count 70,000 COLONIES/ML   Final    Culture     Final    Value: LACTOBACILLUS SPECIES     Note: Standardized susceptibility testing for this organism is not available.   Report Status 10/10/2011 FINAL   Final    Urinalysis    Component Value Date/Time   COLORURINE YELLOW 10/08/2011 1654   APPEARANCEUR CLOUDY* 10/08/2011 1654   LABSPEC 1.024 10/08/2011 1654   PHURINE 5.0 10/08/2011 1654   GLUCOSEU NEGATIVE 10/08/2011 1654   HGBUR NEGATIVE 10/08/2011 1654   BILIRUBINUR SMALL* 10/08/2011 1654   KETONESUR NEGATIVE 10/08/2011 1654   PROTEINUR 30* 10/08/2011 1654   UROBILINOGEN 1.0 10/08/2011 1654   NITRITE NEGATIVE 10/08/2011 1654   LEUKOCYTESUR NEGATIVE 10/08/2011 1654      Studies:  US  Renal 10/08/2011 IMPRESSION: Negative renal ultrasound.  No hydronephrosis.  Original Report Authenticated By: Charline Bills, M.D.    Dg Chest Port 1 View 10/08/2011 IMPRESSION: 1.  Complete opacification of the right hemithorax, similar to the appearance from CT scan for days ago.  2.  Bowel gas projects to the right of the heart, compatible with the known large right anterior diaphragmatic hernia.  3.  Vague densities in the left lung correspond to previously seen pulmonary nodules.  Original Report Authenticated By: Dellia Cloud, M.D.   Scheduled Meds:    . acetaminophen  650  mg Oral Once  . albuterol  2.5 mg Nebulization Q6H  . ARIPiprazole  5 mg Oral Daily  . diphenhydrAMINE  25 mg Intravenous Once  . heparin  5,000 Units Subcutaneous Q8H  . ipratropium  0.5 mg Nebulization Q6H  . lamoTRIgine  25 mg Oral Daily  . pantoprazole  40 mg Oral Daily  . polyethylene glycol  17 g Oral Daily  . venlafaxine XR  150 mg Oral Daily   Continuous Infusions:    . sodium chloride 20 mL (10/10/11 1235)    Time spent: 25 minutes.   LOS: 3 days   Kamilah Correia  [facility] Pager [phone].  If 8PM-8AM, please contact night-coverage at [email or web address], password [facility] 10/11/2011, 12:20 PM

## 2011-10-11 NOTE — Progress Notes (Signed)
Spoke with [name] today at [facility] with Leotis Shames social worker regarding transportation issues and explaining treatment plan.  [employer] has been contacted for help with transportation.

## 2011-10-11 NOTE — Progress Notes (Signed)
Pt is in the hospital.

## 2011-10-11 NOTE — Progress Notes (Signed)
[facility] Radiation Oncology Dept Therapy Treatment Record Phone [phone]   Radiation Therapy was administered to Beth [name] on: 10/11/2011  3:01 PM and was treatment # 4 out of a planned course of 15 treatments.

## 2011-10-12 ENCOUNTER — Ambulatory Visit
Admission: RE | Admit: 2011-10-12 | Discharge: 2011-10-12 | Disposition: A | Discharge status: Home / Self Care | Payer: 59 | Source: Ambulatory Visit | Attending: Radiation Oncology | Admitting: Radiation Oncology

## 2011-10-12 DIAGNOSIS — J9811 Atelectasis: Secondary | ICD-10-CM | POA: Diagnosis present

## 2011-10-12 DIAGNOSIS — R0602 Shortness of breath: Secondary | ICD-10-CM

## 2011-10-12 DIAGNOSIS — N186 End stage renal disease: Secondary | ICD-10-CM

## 2011-10-12 LAB — TYPE AND SCREEN: Unit division: 0

## 2011-10-12 LAB — BASIC METABOLIC PANEL
CO2: 32 mEq/L (ref 19–32)
Chloride: 99 mEq/L (ref 96–112)
Potassium: 3.3 mEq/L — ABNORMAL LOW (ref 3.5–5.1)
Sodium: 140 mEq/L (ref 135–145)

## 2011-10-12 LAB — CBC
HCT: 28.5 % — ABNORMAL LOW (ref 36.0–46.0)
Hemoglobin: 8.9 g/dL — ABNORMAL LOW (ref 12.0–15.0)
MCV: 83.1 fL (ref 78.0–100.0)
Platelets: 386 10*3/uL (ref 150–400)
RBC: 3.43 MIL/uL — ABNORMAL LOW (ref 3.87–5.11)
WBC: 11.1 10*3/uL — ABNORMAL HIGH (ref 4.0–10.5)

## 2011-10-12 MED ORDER — POTASSIUM CHLORIDE CRYS ER 20 MEQ PO TBCR
40.0000 meq | EXTENDED_RELEASE_TABLET | Freq: Once | ORAL | Status: AC
  Administered 2011-10-12: 40 meq via ORAL
  Filled 2011-10-12: qty 2

## 2011-10-12 MED ORDER — POTASSIUM CHLORIDE ER 10 MEQ PO TBCR: 10.0000 meq | EXTENDED_RELEASE_TABLET | Freq: Two times a day (BID) | ORAL | Status: DC

## 2011-10-12 MED ORDER — POLYETHYLENE GLYCOL 3350 17 G PO PACK: 17.0000 g | PACK | Freq: Every day | ORAL | Status: DC

## 2011-10-12 MED ORDER — HYDROCOD POLST-CHLORPHEN POLST 10-8 MG/5ML PO LQCR: 5.0000 mL | Freq: Two times a day (BID) | ORAL | Status: DC | PRN

## 2011-10-12 MED ORDER — BISACODYL 10 MG RE SUPP
10.0000 mg | Freq: Once | RECTAL | Status: DC
  Filled 2011-10-12: qty 1

## 2011-10-12 NOTE — Progress Notes (Signed)
Occupational Therapy Treatment Patient Details Name: Beth [name] MRN: [id number] DOB: [birth year] Today's Date: 10/12/2011 Time: 4098-1191 OT Time Calculation (min): 19 min  OT Assessment / Plan / Recommendation Comments on Treatment Session Pt making progress towards goals despite significant O2 desaturation with mobiliy on RA. Pt started out at 95% on 3 L. After short walk to bathroom and light ADL activity pt desaturated to 79-83% on RA. Educated pt on PLB but pt had difficulty taking slow deep breaths. Now recommending HHOT at d/c.    Follow Up Recommendations  Home health OT    Barriers to Discharge       Equipment Recommendations  None recommended by OT    Recommendations for Other Services    Frequency Min 2X/week   Plan Discharge plan remains appropriate    Precautions / Restrictions Precautions Precautions: Fall   Pertinent Vitals/Pain See above    ADL  Grooming: Performed;Supervision/safety;Wash/dry face Where Assessed - Grooming: Unsupported standing Lower Body Dressing: Performed;Supervision/safety Where Assessed - Lower Body Dressing: Unsupported sitting (donning socks) Toilet Transfer: Performed;Supervision/safety Toilet Transfer Method: Sit to Barista: Regular height toilet Toileting - Clothing Manipulation and Hygiene: Simulated;Supervision/safety Where Assessed - Toileting Clothing Manipulation and Hygiene: Sit to stand from 3-in-1 or toilet Transfers/Ambulation Related to ADLs: Pt with 1 LOB while ambulating to the bathroom which she was able to self correct. ADL Comments: Pt continues to fatigue quickly and desats on RA. After short walk to the bathroom, pts O2 saturation was 79 on RA.    OT Diagnosis:    OT Problem List:   OT Treatment Interventions:     OT Goals ADL Goals ADL Goal: Grooming - Progress: Progressing toward goals ADL Goal: Lower Body Dressing - Progress: Progressing toward goals ADL Goal: Toilet Transfer -  Progress: Progressing toward goals ADL Goal: Toileting - Clothing Manipulation - Progress: Progressing toward goals ADL Goal: Toileting - Hygiene - Progress: Progressing toward goals Miscellaneous OT Goals Miscellaneous OT Goal #1: Pt will verbalize 3 energy conservation strategies and incorporate into ADL activity with min VCs OT Goal: Miscellaneous Goal #1 - Progress: Goal set today  Visit Information  Last OT Received On: 10/12/11 Assistance Needed: +1    Subjective Data  Subjective: I think I need to go to rehab because I forget to go to radiation.   Prior Functioning       Cognition  Overall Cognitive Status: No family/caregiver present to determine baseline cognitive functioning Area of Impairment: Attention;Memory Arousal/Alertness: Awake/alert Orientation Level: Appears intact for tasks assessed Behavior During Session: [facility] for tasks performed Current Attention Level: Selective Memory Deficits: Pt states she "forgets to go to radiation" when she is at home.    Mobility Bed Mobility Supine to Sit: 6: Modified independent (Device/Increase time);With rails;HOB flat Transfers Sit to Stand: 5: Supervision;With upper extremity assist;From bed;From toilet Stand to Sit: 5: Supervision;With upper extremity assist;With armrests;To chair/3-in-1 Details for Transfer Assistance: min VCs for safety and hand placement. Pt has tendency to "flop" when sitting.   Exercises    Balance Static Sitting Balance Static Sitting - Balance Support: Feet supported;No upper extremity supported Static Sitting - Level of Assistance: 7: Independent Static Standing Balance Static Standing - Balance Support: No upper extremity supported;During functional activity Static Standing - Level of Assistance: 5: Stand by assistance  End of Session OT - End of Session Activity Tolerance: Patient limited by fatigue Patient left: in chair;with call bell/phone within reach Nurse Communication: Other (comment)  (O2 desaturation with mobility)  GO     Lawrnce Reyez A OTR/L [id number] 10/12/2011, 2:31 PM

## 2011-10-12 NOTE — Plan of Care (Signed)
Problem: Discharge Progression Outcomes Goal: Discharge plan in place and appropriate Outcome: Progressing Working on plan for discharge. Social work looking into possible SNF placement.

## 2011-10-12 NOTE — Plan of Care (Signed)
Problem: Phase I Progression Outcomes Goal: Voiding-avoid urinary catheter unless indicated Outcome: Adequate for Discharge Patient voids per bedside commode.

## 2011-10-12 NOTE — Discharge Summary (Signed)
Physician Discharge Summary  Mozella [name] ZOX:[id number] DOB: 05/26/1960 DOA: 10/08/2011  PCP: [name], MD  Admit date: 10/08/2011 Discharge date: 10/12/2011  Recommendations for Outpatient Follow-up:  1.  Do not prescribe PPI therapy, plasma concentration and pharmacologic effects of Erlotinib may be decreased by Proton Pump Inhibitors. According to official package labeling, coadministration of Proton Pump Inhibitors with Erlotinib should be avoided, if possible.  2.  Home health PT/OT set up for patient. 3.  Please be cautious with the number of pain medications prescribed at one time.   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  Diet recommendation: Heart healthy, low sodium.  History of present illness:  Beth [name] is a 51 year old woman with newly diagnosed stage IV adenocarcinoma of the lung (started radiation treatment on 09/29/11) who was found to have ARF when routine blood work done at the [facility] on 10/07/11 revealed a creatinine of 3.39 (baseline creatinine 0.5-0.6). [facility], and she ultimately was brought to the hospital by her family due to somnolence. Upon initial evaluation in the ER, she had oxygen saturations in the 70's.   Hospital Course by problem:  Principal Problem:  *Somnolence, likely from decreased renal clearance of pain medication in the setting of ARF /  hypoxia  Hypoxia likely from respiratory depression. Improved with supplemental oxygen. Mental status improved.  Oxygen saturations stable since admission.  Flexaril, MS Contin, MSIR placed on hold.  Can resume Flexaril and MSIR at d/c, but was instructed to d/c MS Contin. Active Problems:  Constipation  Continue Miralax. Will give dulcolax suppository prior to d/c. Tachycardia  PE ruled out with CT of chest. Elevated BNP  Felt to be reflective of ARF. CXR was not consistent with significant pulmonary edema. Essential hypertension, benign  HCTZ on hold secondary to ARF.  BP stable. OK to resume HCTZ at d/c, renal function back to baseline. GERD  D/C PPI due to interaction with [name]. 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  On iron supplement at home (currently on hold).  Baseline hemoglobin 7-8.0 mg/dL.  2 units of PRBCs given 10/11/11. Stage IV adenocarcinoma of lung with collapse of right lung [name] initially placed on hold due to concerns that it may have contributed to ARF. [name] resumed by oncologist 10/12/11.  Leukocytosis  U/A clear. CXR not indicative of PNA.  Normalized, likely demargination from stress reaction. 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  Improved with hydration. Acute renal failure with hyperphosphatemia  Likely from narcotic OD, had only had one dose of [name].  Renal ultrasound negative for obstruction.  Creatinine back to normal after hydrating.  Dr. Arbutus Ped resumed [name] 10/12/11. Thrombocytosis  Likely reactive. Transaminitis  Likely a side effect of tarceva. No obvious liver metastasis noted on PET scan.  Improving.  Procedures:  2 units of PRBCs given 10/11/11.  Consultations:  Dr. Si Gaul, Oncology  Discharge Exam: Filed Vitals:   10/12/11  0656  BP: 93/64  Pulse: 106  Temp: 98.4 F (36.9 C)  Resp: 20   Filed Vitals:   10/11/11 1940 10/11/11 2015 10/11/11 2300 10/12/11 0656  BP: 126/80 134/88 107/70  93/64  Pulse: 121 123 114 106  Temp: 98.4 F (36.9 C) 98.3 F (36.8 C) 99 F (37.2 C) 98.4 F (36.9 C)  TempSrc: Oral Oral Oral Oral  Resp: 20 20 20 20   Height:      Weight:    105.325 kg (232 lb 3.2 oz)  SpO2:   100% 96%    Gen:  NAD Cardiovascular:  RRR, No M/R/G Respiratory: Lungs CTAB Gastrointestinal: Abdomen soft, NT/ND with normal active bowel sounds. Extremities: No C/E/C   Discharge Instructions  Discharge Orders    Future Appointments: Provider: Department: Dept Phone: Center:   10/13/2011  2:45 PM [facility] [phone] None   10/14/2011 2:45 PM [facility] [phone] None   10/15/2011 2:45 PM [facility] [phone] None   10/18/2011 3:00 PM [facility] [phone] None   10/19/2011 2:45 PM [facility] [phone] None   10/20/2011 2:45 PM [facility] [phone] None   10/21/2011 9:45 AM Radene Gunning [facility] [phone] None   10/21/2011 10:15 AM Si Gaul, MD [facility] [phone] None   10/21/2011 2:45 PM [facility] [phone] None   10/22/2011 2:45 PM [facility] [phone] None   10/25/2011 3:30 PM [facility] [phone] None   10/26/2011 2:45 PM [facility] [phone] None     Future Orders Please Complete By Expires   Diet - low sodium heart healthy      Home Health      Questions: Responses:   To provide the following care/treatments PT    OT   Increase activity slowly      Call MD for:  severe uncontrolled pain      Call MD for:  persistant nausea and vomiting      Call MD for:  extreme fatigue      Discharge instructions      Comments:   Be very careful with your pain medicines.  Do not take more than the recommended amount.  Do not take Nexium or any acid  suppressing medication as these medications decrease the effectiveness of [name], the chemotherapy you are being given to treat lung cancer.     Medication List  As of 10/12/2011 10:45 AM   STOP taking these medications         esomeprazole 40 MG capsule      hydrocodone-acetaminophen 5-500 MG per capsule      morphine 15 MG 12 hr tablet      oxyCODONE-acetaminophen 5-325 MG per tablet         TAKE these medications         ARIPiprazole 5 MG tablet   Commonly known as: ABILIFY   Take 5 mg by mouth daily.      chlorpheniramine-HYDROcodone 10-8 MG/5ML Lqcr   Commonly known as: TUSSIONEX   Take 5 mLs by mouth every 12 (twelve) hours as needed.      cyclobenzaprine 5 MG tablet   Commonly known as: FLEXERIL   Take 5 mg by mouth daily as needed.      diphenhydrAMINE 25 mg capsule   Commonly known as: BENADRYL   One to two  tabs every 6 hours prn allergies      erlotinib 150 MG tablet   Commonly known as: [name]   Take 1 tablet (150 mg total) by mouth daily. Take on an empty stomach 1 hour before meals or 2 hours after.      fish oil-omega-3 fatty acids 1000 MG capsule   Take 2 g by mouth daily.      folic acid 1 MG tablet   Commonly known as: FOLVITE   Take 1 tablet (1 mg total) by mouth daily.      hydrochlorothiazide 25 MG tablet   Commonly known as: HYDRODIURIL   Take 25 mg by mouth daily.      INTEGRA 62.5-62.5-40-3 MG Caps   Take 1 tablet by mouth daily.      lamoTRIgine 25 MG tablet   Commonly known as: LAMICTAL   Take 25 mg by mouth daily.      morphine 15 MG tablet   Commonly known as: MSIR   Take 1 tablet (15 mg total) by mouth every 4 (four) hours as needed for pain.      multivitamin with minerals tablet   Take 1 tablet by mouth daily.      polyethylene glycol packet   Commonly known as: MIRALAX / GLYCOLAX   Take 17 g by mouth daily.      potassium chloride 10 MEQ tablet   Commonly known as: K-DUR   Take 1 tablet (10 mEq total) by mouth 2  (two) times daily.      prochlorperazine 10 MG tablet   Commonly known as: COMPAZINE   Take 1 tablet (10 mg total) by mouth every 6 (six) hours as needed.      Venlafaxine HCl 150 MG Tb24   Take 150 mg by mouth daily.      Vitamin D (Ergocalciferol) 50000 UNITS Caps   Commonly known as: DRISDOL   Take 50,000 Units by mouth every 7 (seven) days. On wednesdays           Follow-up Information    Follow up with Maryln Gottron, MD. (Daily at your scheduled radiation treatment time M-F)    Contact information:   [address] [place] [address] [phone]       Follow up with Lajuana Matte., MD. Schedule an appointment as soon as possible for a visit in 1 week.   Contact information:   [address] [place] [address] [phone]       Follow up with [name], MD. Schedule an appointment as soon as possible for a visit in 1 week.   Contact information:   [address] [place] [address] [phone]           The results of significant diagnostics from this hospitalization (including imaging, microbiology, ancillary and laboratory) are listed below for reference.    Significant Diagnostic Studies:  US Renal 10/08/2011  IMPRESSION: Negative renal ultrasound.  No hydronephrosis.  Original Report Authenticated By: Charline Bills, M.D.    Dg Chest Port 1 View 10/08/2011  IMPRESSION: 1.  Complete opacification of the right hemithorax, similar to the appearance from CT scan for days ago.  2.  Bowel gas projects to the right of the heart, compatible with the known large right anterior diaphragmatic hernia.  3.  Vague densities in the left lung correspond to previously seen pulmonary nodules.  Original Report Authenticated By: Dellia Cloud, M.D.    Ct Angio Chest W/cm &/  or Wo Cm 10/11/2011  IMPRESSION: Stable complete collapse of right lung due to a central obstructing tumor.  No evidence of  acute pulmonary embolism.  Original Report Authenticated By: Reola Calkins, M.D.   Microbiology: Recent Results (from the past 240 hour(s))  TECHNOLOGIST REVIEW     Status: Normal   Collection Time   10/07/11  9:33 AM      Component Value Range Status Comment   Technologist Review 3 %nRBC   Final   URINE CULTURE     Status: Normal   Collection Time   10/08/11  4:54 PM      Component Value Range Status Comment   Specimen Description URINE, CATHETERIZED   Final    Special Requests NONE   Final    Culture  Setup Time 10/09/2011 01:23   Final    Colony Count 70,000 COLONIES/ML   Final    Culture     Final    Value: LACTOBACILLUS SPECIES     Note: Standardized susceptibility testing for this organism is not available.   Report Status 10/10/2011 FINAL   Final      Labs: Basic Metabolic Panel:  Lab 10/12/11 1610 10/10/11 0538 10/09/11 0533 10/08/11 1648 10/07/11 0933  NA 140 134* 130* 126* 131*  K 3.3* 3.9 4.4 4.4 4.1  CL 99 97 90* 84* 92*  CO2 32 25 24 22 24   GLUCOSE 104* 87 109* 116* 142*  BUN 10 30* 57* 52* 26*  CREATININE 0.53 0.62 2.55* 4.69* 3.39*  CALCIUM 8.7 8.5 8.8 8.8 8.5  MG -- -- -- 2.3 --  PHOS -- -- -- 7.1* --   Liver Function Tests:  Lab 10/10/11 0538 10/09/11 0533 10/08/11 1648 10/07/11 0933  AST 70* 175* 204* 21  ALT 80* 109* 83* <8  ALKPHOS 80 90 96 87  BILITOT 0.3 0.3 0.4 0.4  PROT 6.9 7.8 8.4* 7.5  ALBUMIN 2.3* 2.9* 3.2* 3.7   No results found for this basename: LIPASE:5,AMYLASE:5 in the last 168 hours No results found for this basename: AMMONIA:5 in the last 168 hours CBC:  Lab 10/12/11 0454 10/10/11 0538 10/08/11 1648 10/07/11 0933  WBC 11.1* 9.4 15.1* 14.3*  NEUTROABS -- -- 11.9* 11.1*  HGB 8.9* 7.1* 8.0* 7.9*  HCT 28.5* 22.8* 25.5* 25.2*  MCV 83.1 83.5 82.0 83.0  PLT 386 437* 588* 552*   Cardiac Enzymes:  Lab 10/08/11 1648  CKTOTAL --  CKMB --  CKMBINDEX --  TROPONINI <0.30   BNP: BNP (last 3 results)  Basename 10/08/11 1648    PROBNP 17339.0*   CBG:  Lab 10/09/11 0814  GLUCAP 114*    Time coordinating discharge: 45 minutes.  Signed:  Codee Tutson  Pager [phone] [facility] 10/12/2011, 10:45 AM

## 2011-10-12 NOTE — Progress Notes (Signed)
[facility] Radiation Oncology Dept Therapy Treatment Record Phone [phone]   Radiation Therapy was administered to Beth [name] on: 10/12/2011  10:19 AM and was treatment # 5 out of a planned course of 15 treatments.

## 2011-10-12 NOTE — Progress Notes (Signed)
Called Dr. Darnelle Catalan to update on patient condition. Pt has increased temp of 99.2, increased pain, and decreased urine output. Will continue to monitor. New orders received.

## 2011-10-12 NOTE — Progress Notes (Signed)
PT Cancellation Note  Treatment cancelled today due to pt reports increased pain in R side of chest.  RN in to see pt just prior to arrival and medicated pt.Maida Sale E 10/12/2011, 3:00 PM Pager: [phone]

## 2011-10-12 NOTE — Plan of Care (Signed)
Problem: Discharge Progression Outcomes Goal: Pain controlled with appropriate interventions Outcome: Progressing Pt educated on maintaining pain control and not waiting until the pain is full-blown before treating as it will take longer to catch up.

## 2011-10-12 NOTE — Plan of Care (Signed)
Problem: Phase I Progression Outcomes Goal: Up in chair, BRP Outcome: Adequate for Discharge Patient sat up in chair today, tolerated very well.

## 2011-10-12 NOTE — Clinical Social Work Psychosocial (Unsigned)
     Clinical Social Work Department BRIEF PSYCHOSOCIAL ASSESSMENT 10/12/2011  Patient:  Beth [name], Beth [name]     Account Number:  [id number]     Admit date:  10/08/2011  Clinical Social Worker:  Hattie Perch  Date/Time:  10/12/2011 12:00 M  Referred by:  Physician  Date Referred:  10/12/2011 Referred for  SNF Placement   Other Referral:   Interview type:  Patient Other interview type:    PSYCHOSOCIAL DATA Living Status:  ALONE Admitted from facility:   Level of care:   Primary support name:  [place] Primary support relationship to patient:  PARENT Degree of support available:   fair    CURRENT CONCERNS Current Concerns  Post-Acute Placement   Other Concerns:    SOCIAL WORK ASSESSMENT / PLAN CSW met with patient. patient is alert and oriented X3 and in need of snf placement. patient is agreeable to snf placement but states that she doesnt know of any places around here.   Assessment/plan status:   Other assessment/ plan:   Information/referral to community resources:    PATIENTS/FAMILYS RESPONSE TO PLAN OF CARE: Patient is agreeable to CSW faxing patient out and patient recieving bed offers.

## 2011-10-13 ENCOUNTER — Ambulatory Visit
Admission: RE | Admit: 2011-10-13 | Discharge: 2011-10-13 | Disposition: A | Discharge status: Home / Self Care | Payer: 59 | Source: Ambulatory Visit | Attending: Radiation Oncology | Admitting: Radiation Oncology

## 2011-10-13 DIAGNOSIS — J9819 Other pulmonary collapse: Secondary | ICD-10-CM

## 2011-10-13 DIAGNOSIS — I10 Essential (primary) hypertension: Secondary | ICD-10-CM

## 2011-10-13 LAB — BASIC METABOLIC PANEL
Calcium: 8.8 mg/dL (ref 8.4–10.5)
GFR calc Af Amer: >90 mL/min (ref >90)
GFR calc non Af Amer: >90 mL/min (ref >90)
Sodium: 139 mEq/L (ref 135–145)

## 2011-10-13 MED ORDER — DOCUSATE SODIUM 100 MG PO CAPS
100.0000 mg | ORAL_CAPSULE | Freq: Two times a day (BID) | ORAL | Status: DC
  Administered 2011-10-13 – 2011-10-14 (×2): 100 mg via ORAL
  Filled 2011-10-13 (×4): qty 1

## 2011-10-13 MED ORDER — DSS 100 MG PO CAPS: 100.0000 mg | ORAL_CAPSULE | Freq: Two times a day (BID) | ORAL | Status: DC

## 2011-10-13 MED ORDER — POLYETHYLENE GLYCOL 3350 17 G PO PACK
17.0000 g | PACK | Freq: Three times a day (TID) | ORAL | Status: DC
  Administered 2011-10-13 – 2011-10-14 (×2): 17 g via ORAL
  Filled 2011-10-13 (×5): qty 1

## 2011-10-13 MED ORDER — MORPHINE SULFATE 2 MG/ML IJ SOLN: 2.0000 mg | INTRAMUSCULAR | Status: DC | PRN

## 2011-10-13 MED ORDER — POLYETHYLENE GLYCOL 3350 17 G PO PACK: 17.0000 g | PACK | Freq: Every day | ORAL | Status: AC

## 2011-10-13 NOTE — Progress Notes (Signed)
[facility] PROGRESS NOTE  Beth [name] ZOX:[id number] DOB: 04/13/1960 DOA: 10/08/2011 PCP: [name], MD  * Please note that D/C summary done by my colleague 10/12/2011. There is no change in medical management since 10/12/2011. Patient will complete radiation therapy at 2:45 pm today and will be discharged to SNF, [facility] after RT. Patient has decided on [facility], SW aware of this decision.  Assessment and Plan:  Principal Problem:  *Somnolence, likely from decreased renal clearance of pain medication in the setting of ARF / hypoxia  Secondary to hypoxia Respiratory status stable at this time Please note that the patient will go for radiation therapy today at 2:45 pm after which she can be discharged to SNF Flexaril, MS Contin, MSIR placed on hold. Can resume Flexaril and MSIR at d/c, but was instructed to d/c MS Contin.  Active Problems:  Constipation  Continue Miralax and colace Tachycardia  PE ruled out with CT of chest. Elevated BNP  Felt to be reflective of ARF. CXR was not consistent with significant pulmonary edema. Essential hypertension, benign  HCTZ on hold secondary to ARF.  BP stable.  OK to resume HCTZ at d/c, renal function back to baseline. GERD  D/Ced PPI due to interaction with [name]. 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  On iron supplement at home  Baseline hemoglobin 7-8.0 mg/dL.  2 units of PRBCs given 10/11/11. Hemoglobin 8.9 10/12/2011 Stage IV adenocarcinoma of lung with collapse of right lung  [name] initially placed on hold due to concerns that it may have contributed to ARF.  [name] resumed by oncologist 10/12/11.  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  Sodium 139 today Acute renal failure with hyperphosphatemia  Likely from narcotic OD, had only had one dose of [name].  Renal ultrasound negative for obstruction.  Creatinine back to normal value Dr. Arbutus Ped resumed [name] 10/12/11. Thrombocytosis    Platelet count [id number], WNL Transaminitis  Likely a side effect of tarceva. No obvious liver metastasis noted on PET scan.   Procedures:  2 units of PRBCs given 10/11/11. Consultations:  Dr. Si Gaul, Oncology  Manson Passey, MD  [facility] Pager [phone]  If 7PM-7AM, please contact night-coverage [email or web address] Password [email or web address] 10/13/2011, 1:18 PM   LOS: 5 days   HPI/Subjective: No acute events overnight.  Objective: Filed Vitals:   10/12/11 1505 10/12/11 1600 10/12/11 2215 10/13/11 0552  BP:   117/75 116/74  Pulse:   105 101  Temp:   98.5 F (36.9 C) 97.7 F (36.5 C)  TempSrc:   Oral Oral  Resp:   16 16  Height:      Weight:    233 lb (105.688 kg)  SpO2: 94% 100% 98% 100%    Intake/Output Summary (Last 24 hours) at 10/13/11 1318 Last data filed at 10/13/11 0050  Gross per 24 hour  Intake    240 ml  Output    250 ml  Net    -10 ml    Exam:   General:  Pt is alert, follows commands appropriately, not in acute distress  Cardiovascular: Regular rate and rhythm, S1/S2, no murmurs, no rubs, no gallops  Respiratory: Clear to auscultation bilaterally, no wheezing, no crackles, no rhonchi  Abdomen: Soft, non tender, non distended, bowel sounds present, no guarding  Extremities: No edema, pulses DP and PT palpable bilaterally  Neuro: Grossly nonfocal  Data Reviewed: Basic Metabolic Panel:  Lab 10/13/11 8295 10/12/11 0454 10/10/11 0538 10/09/11 0533 10/08/11 1648  NA 139 140  134* 130* 126*  K 3.7 3.3* 3.9 4.4 4.4  CL 98 99 97 90* 84*  CO2 32 32 25 24 22   GLUCOSE 80 104* 87 109* 116*  BUN 12 10 30* 57* 52*  CREATININE 0.62 0.53 0.62 2.55* 4.69*  CALCIUM 8.8 8.7 8.5 8.8 8.8  MG -- -- -- -- 2.3  PHOS -- -- -- -- 7.1*   Liver Function Tests:  Lab 10/10/11 0538 10/09/11 0533 10/08/11 1648 10/07/11 0933  AST 70* 175* 204* 21  ALT 80* 109* 83* <8  ALKPHOS 80 90 96 87  BILITOT 0.3 0.3 0.4 0.4  PROT 6.9 7.8 8.4* 7.5  ALBUMIN  2.3* 2.9* 3.2* 3.7   CBC:  Lab 10/12/11 0454 10/10/11 0538 10/08/11 1648 10/07/11 0933  WBC 11.1* 9.4 15.1* 14.3*  NEUTROABS -- -- 11.9* 11.1*  HGB 8.9* 7.1* 8.0* 7.9*  HCT 28.5* 22.8* 25.5* 25.2*  MCV 83.1 83.5 82.0 83.0  PLT 386 437* 588* 552*   Cardiac Enzymes:  Lab 10/08/11 1648  CKTOTAL --  CKMB --  CKMBINDEX --  TROPONINI <0.30   BNP: No components found with this basename: POCBNP:5 CBG:  Lab 10/09/11 0814  GLUCAP 114*    Recent Results (from the past 240 hour(s))  TECHNOLOGIST REVIEW     Status: Normal   Collection Time   10/07/11  9:33 AM      Component Value Range Status Comment   Technologist Review 3 %nRBC   Final   URINE CULTURE     Status: Normal   Collection Time   10/08/11  4:54 PM      Component Value Range Status Comment   Specimen Description URINE, CATHETERIZED   Final    Special Requests NONE   Final    Culture  Setup Time 10/09/2011 01:23   Final    Colony Count 70,000 COLONIES/ML   Final    Culture     Final    Value: LACTOBACILLUS SPECIES     Note: Standardized susceptibility testing for this organism is not available.   Report Status 10/10/2011 FINAL   Final      Studies: No results found.  Scheduled Meds:   . ARIPiprazole  5 mg Oral Daily  . bisacodyl  10 mg Rectal Once  . docusate sodium  100 mg Oral BID  . erlotinib  150 mg Oral Daily  . heparin  5,000 Units Subcutaneous Q8H  . lamoTRIgine  25 mg Oral Daily  . pantoprazole  40 mg Oral Daily  . polyethylene glycol  17 g Oral TID  . venlafaxine XR  150 mg Oral Daily  . DISCONTD: polyethylene glycol  17 g Oral Daily   Continuous Infusions:   . sodium chloride 20 mL (10/10/11 1235)

## 2011-10-13 NOTE — Progress Notes (Signed)
CSW met with patient. CSW gave patient bed offers. Will follow for choice.  Insiya Oshea C. Ellijah Leffel MSW, LCSW [phone]

## 2011-10-13 NOTE — Progress Notes (Signed)
PT Cancellation Note  Treatment cancelled today due to pt wishes to rest before radiation.   Pt reports d/c home after radiation.  Kaena Santori,KATHrine E 10/13/2011, 2:05 PM Pager: [phone]

## 2011-10-13 NOTE — Progress Notes (Signed)
[name] is unable to take patient as cancer medications are more than the reimbursement rate. CSW called [facility]. Faxed DC summary. Possible acceptance there in AM.  Auriel Kist C. Mykal Kirchman MSW, LCSW [phone]

## 2011-10-14 ENCOUNTER — Ambulatory Visit
Admission: RE | Admit: 2011-10-14 | Discharge: 2011-10-14 | Disposition: A | Discharge status: Home / Self Care | Payer: 59 | Source: Ambulatory Visit | Attending: Radiation Oncology | Admitting: Radiation Oncology

## 2011-10-14 DIAGNOSIS — K5900 Constipation, unspecified: Secondary | ICD-10-CM

## 2011-10-14 MED ORDER — HYDROCOD POLST-CHLORPHEN POLST 10-8 MG/5ML PO LQCR: 5.0000 mL | Freq: Two times a day (BID) | ORAL | Status: DC | PRN

## 2011-10-14 MED ORDER — POLYETHYLENE GLYCOL 3350 17 G PO PACK: 17.0000 g | PACK | Freq: Every day | ORAL | Status: AC

## 2011-10-14 MED ORDER — MORPHINE SULFATE 15 MG PO TABS: 15.0000 mg | ORAL_TABLET | ORAL | Status: AC | PRN

## 2011-10-14 MED ORDER — CYCLOBENZAPRINE HCL 5 MG PO TABS: 5.0000 mg | ORAL_TABLET | Freq: Every day | ORAL | Status: DC | PRN

## 2011-10-14 MED ORDER — HYDROCODONE-ACETAMINOPHEN 5-325 MG PO TABS: 2.0000 | ORAL_TABLET | ORAL | Status: AC | PRN

## 2011-10-14 MED ORDER — DSS 100 MG PO CAPS: 100.0000 mg | ORAL_CAPSULE | Freq: Two times a day (BID) | ORAL | Status: AC

## 2011-10-14 MED ORDER — DIPHENHYDRAMINE HCL 25 MG PO CAPS: ORAL_CAPSULE | ORAL | Status: DC

## 2011-10-14 NOTE — Progress Notes (Signed)
CSW met with patient. Patient is accepted to [facility]. Patient states that she will go home with home health. No longer interested in snf.  Jove Beyl C. Markese Bloxham MSW, LCSW [phone]

## 2011-10-14 NOTE — Progress Notes (Signed)
Pt sats 79% on RA. Audrie Lia

## 2011-10-14 NOTE — Discharge Summary (Signed)
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  Leukocytosis  Hyponatremia  Acute renal failure  Thrombocytosis  Transaminitis  Hypoxia secondary to respiratory depression from narcotics/ARF  Hyperphosphatemia  Elevated brain natriuretic peptide (BNP) level  Tachycardia  Constipation  Collapse of right lung  Discharge Condition: medically stable for discharge home with home PT; patient has declined SNF offers at this time: I have alos mentioned to her about transportation issue for radiation treatment but she has assured me that that has been set up and she will have transport provided for radiation therapy  Recommendations for Outpatient Follow-up:  1. Do not prescribe PPI therapy, plasma concentration and pharmacologic effects of Erlotinib may be decreased by Proton Pump Inhibitors. According to official package labeling, coadministration of Proton Pump Inhibitors with Erlotinib should be avoided, if possible.  2. Home health PT/OT set up for patient.  3. Please be cautious with the number of pain medications prescribed at one time.    Diet recommendation: Heart healthy, low sodium.   History of present illness:  B[name] is a 51 year old woman with newly diagnosed stage IV adenocarcinoma of the lung (started radiation treatment on 09/29/11) who was found to have ARF when routine blood work done at the [facility] on 10/07/11 revealed a creatinine of 3.39 (baseline creatinine 0.5-0.6). [facility], and she ultimately was brought to the hospital by her family  due to somnolence. Upon initial evaluation in the ER, she had oxygen saturations in the 70's.   Hospital Course by problem:   Principal Problem:  *Somnolence, likely from decreased renal clearance of pain medication in the setting of ARF / hypoxia  Hypoxia likely from respiratory depression. Improved with supplemental oxygen. Mental status improved.  Oxygen saturations stable since admission.  Flexaril, MS IR and norco on discharge; I have instructed patient to stop taking MS contin Active Problems:  Constipation  Continue Miralax and colalce; patient refused biscaodyl Tachycardia  PE ruled out with CT of chest. Elevated BNP  Felt to be reflective of ARF. CXR was not consistent with significant pulmonary edema. Essential hypertension, benign  HCTZ on hold secondary to ARF.  BP stable.  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  On iron supplement at home (currently on hold).  Baseline hemoglobin 7-8.0 mg/dL.  2 units of PRBCs given 10/11/11. Stage IV adenocarcinoma of lung with collapse of right lung  [name] initially placed on hold due to concerns that it may have contributed to ARF.  [name] resumed by oncologist 10/12/11.  Leukocytosis  U/A clear. CXR not indicative of PNA.  Normalized, likely demargination from stress reaction. 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  Improved with hydration. Acute renal failure with hyperphosphatemia  Likely from narcotic OD, had only had one dose of [name].  Renal ultrasound negative for obstruction.  Creatinine back to normal after hydrating.  Dr. Arbutus Ped resumed [name] 10/12/11. Thrombocytosis  Likely reactive. Transaminitis  Likely a  side effect of tarceva. No obvious liver metastasis noted on PET scan.  Improving.  Procedures:   2 units of PRBCs given 10/11/11. Consultations:  Dr. Si Gaul, Oncology  Discharge Exam: Filed Vitals:   10/14/11 0452  BP: 106/64  Pulse: 101  Temp: 98.3 F (36.8 C)  Resp: 17   Filed Vitals:   10/13/11 0552 10/13/11 1413 10/13/11 2243 10/14/11 0452  BP: 116/74 105/67 129/71 106/64  Pulse: 101 104 102 101  Temp: 97.7 F (36.5 C) 97.6 F (36.4 C) 98.3 F (36.8 C) 98.3 F (36.8 C)  TempSrc: Oral Oral Oral Oral  Resp: 16 18 18 17   Height:      Weight: 233 lb (105.688 kg)   235 lb 3.2 oz (106.686 kg)  SpO2: 100% 99% 93% 100%    General: Pt is alert, follows commands appropriately, not in acute distress Cardiovascular: Regular rate and rhythm, S1/S2 +, no murmurs, no rubs, no gallops Respiratory: diminished breath sounds bilaterally, no wheezing, no crackles, no rhonchi Abdominal: Soft, non tender, non distended, bowel sounds +, no guarding Extremities: no edema, no cyanosis, pulses palpable bilaterally DP and PT Neuro: Grossly nonfocal  Discharge Instructions  Discharge Orders    Future Appointments: Provider: Department: Dept Phone: Center:   10/15/2011 2:45 PM [facility] [phone] None   10/18/2011 3:00 PM [facility] [phone] None   10/19/2011 2:45 PM [facility] [phone] None   10/20/2011 2:45 PM [facility] [phone] None   10/21/2011 9:45 AM Radene Gunning [facility] [phone] None   10/21/2011 10:15 AM Si Gaul, MD [facility] [phone] None   10/21/2011 4:30 PM [facility] [phone] None   10/22/2011 2:45 PM [facility] [phone] None   10/25/2011 3:30 PM [facility] [phone] None   10/26/2011 2:45 PM [facility] [phone] None     Future Orders Please Complete By Expires   Diet - low sodium heart healthy       Diet - low sodium heart healthy      Diet - low sodium heart healthy      Home Health      Questions: Responses:   To provide the following care/treatments PT    OT   Increase activity slowly      Call MD for:  severe uncontrolled pain      Call MD for:  persistant nausea and vomiting      Call MD for:  extreme fatigue      Discharge instructions      Comments:   Be very careful with your pain medicines.  Do not take more than the recommended amount.  Do not take Nexium or any acid suppressing medication as these medications decrease the effectiveness of [name], the chemotherapy you are being given to treat lung cancer.   Increase activity slowly      Call MD for:  persistant nausea and vomiting      Call MD for:  severe uncontrolled pain      Call MD for:  difficulty breathing, headache or visual disturbances      Call MD for:  persistant dizziness or light-headedness      Increase activity slowly      Call MD for:  persistant nausea and vomiting      Call MD for:  severe uncontrolled pain      Call MD  for:  difficulty breathing, headache or visual disturbances      Call MD for:  persistant dizziness or light-headedness        Medication List  As of 10/14/2011 10:17 AM   STOP taking these medications         esomeprazole 40 MG capsule      hydrocodone-acetaminophen 5-500 MG per capsule      morphine 15 MG 12 hr tablet      oxyCODONE-acetaminophen 5-325 MG per tablet         TAKE these medications         ARIPiprazole 5 MG tablet   Commonly known as: ABILIFY   Take 5 mg by mouth daily.      chlorpheniramine-HYDROcodone 10-8 MG/5ML Lqcr   Commonly known as: TUSSIONEX   Take 5 mLs by mouth every 12 (twelve) hours as needed.      cyclobenzaprine 5 MG tablet   Commonly known as: FLEXERIL   Take 1 tablet (5 mg total) by mouth daily as needed for muscle spasms.      diphenhydrAMINE 25 mg capsule   Commonly known as: BENADRYL   One to two tabs every 6 hours prn  allergies      DSS 100 MG Caps   Take 100 mg by mouth 2 (two) times daily.      erlotinib 150 MG tablet   Commonly known as: [name]   Take 1 tablet (150 mg total) by mouth daily. Take on an empty stomach 1 hour before meals or 2 hours after.      fish oil-omega-3 fatty acids 1000 MG capsule   Take 2 g by mouth daily.      folic acid 1 MG tablet   Commonly known as: FOLVITE   Take 1 tablet (1 mg total) by mouth daily.      hydrochlorothiazide 25 MG tablet   Commonly known as: HYDRODIURIL   Take 25 mg by mouth daily.      HYDROcodone-acetaminophen 5-325 MG per tablet   Commonly known as: NORCO/VICODIN   Take 2 tablets by mouth every 4 (four) hours as needed for pain (for moderate pain).      INTEGRA 62.5-62.5-40-3 MG Caps   Take 1 tablet by mouth daily.      lamoTRIgine 25 MG tablet   Commonly known as: LAMICTAL   Take 25 mg by mouth daily.      morphine 15 MG tablet   Commonly known as: MSIR   Take 1 tablet (15 mg total) by mouth every 4 (four) hours as needed for pain (for breakthrough pain).      multivitamin with minerals tablet   Take 1 tablet by mouth daily.      polyethylene glycol packet   Commonly known as: MIRALAX / GLYCOLAX   Take 17 g by mouth daily.      polyethylene glycol packet   Commonly known as: MIRALAX / GLYCOLAX   Take 17 g by mouth daily.      potassium chloride 10 MEQ tablet   Commonly known as: K-DUR   Take 1 tablet (10 mEq total) by mouth 2 (two) times daily.      prochlorperazine 10 MG tablet   Commonly known as: COMPAZINE   Take 1 tablet (10 mg total) by mouth every 6 (six) hours as needed.      Venlafaxine HCl 150 MG Tb24   Take 150 mg by mouth daily.      Vitamin D (Ergocalciferol) 50000  UNITS Caps   Commonly known as: DRISDOL   Take 50,000 Units by mouth every 7 (seven) days. On wednesdays           Follow-up Information    Follow up with Maryln Gottron, MD. (Daily at your scheduled radiation treatment time M-F)     Contact information:   [address] [place] [address] [phone]       Follow up with Lajuana Matte., MD. Schedule an appointment as soon as possible for a visit in 1 week.   Contact information:   [address] [place] [address] [phone]       Follow up with [name], MD. Schedule an appointment as soon as possible for a visit in 1 week.   Contact information:   [address] [place] [address] [phone]           The results of significant diagnostics from this hospitalization (including imaging, microbiology, ancillary and laboratory) are listed below for reference.    Significant Diagnostic Studies: Ct Angio Chest W/cm &/or Wo Cm 10/11/2011  *RADIOLOGY REPORT*  Clinical Data:  Shortness of breath and elevated D-dimer.  Known central obstructing mass on the right causing collapse of the right lung by prior CT.  CT ANGIOGRAPHY CHEST WITH CONTRAST  Technique:  Multidetector CT imaging of the chest was performed using the standard protocol during bolus administration of intravenous contrast.  Multiplanar CT image reconstructions including MIPs were obtained to evaluate the vascular anatomy.  Contrast: OMNIPAQUE IOHEXOL 350 MG/ML SOLN 100 ml Omnipaque 350 IV  Comparison:  10/04/2011  Findings:  Stable complete collapse of the right lung with some degree of surrounding pleural fluid and central obstructing tumor. Aeration of the left lung also stable.  No evidence of pulmonary embolism in the left lung.  Left-sided airways are normally patent. No left pleural fluid.  Stable cardiomegaly.  Review of the MIP images confirms the above findings.  IMPRESSION: Stable complete collapse of right lung due to a central obstructing tumor.  No evidence of acute pulmonary embolism.  Original Report Authenticated By: Reola Calkins, M.D.   Ct Angio Chest W/cm &/or Wo Cm 10/04/2011  *RADIOLOGY REPORT*   Clinical Data: Lung cancer with chest pain and shortness of breath. Evaluate for pulmonary embolus versus pleural effusion.  CT ANGIOGRAPHY CHEST  Technique:  Multidetector CT imaging of the chest using the standard protocol during bolus administration of intravenous contrast. Multiplanar reconstructed images including MIPs were obtained and reviewed to evaluate the vascular anatomy.  Contrast: OMNIPAQUE IOHEXOL 350 MG/ML SOLN  Comparison: PET CT 09/20/2011 and CT chest 08/30/2011.  Findings: No definite pulmonary embolus.  Mediastinal adenopathy measures up to 1.7 cm in the AP window (previously 1.3 cm).  There is a centrally obstructing ill-defined mass in the right lung measuring approximately 9.2 x 6.6 cm (previously 8.8 x 6.1 cm).  A large complex collection of pleural fluid occupies the right hemithorax, with a thick rind of soft tissue. Overall size has decreased slightly from 08/30/2011.  A single locule of air is seen posteriorly within the right pleural space, which may be due to intervening thoracentesis. Pulmonary arteries are enlarged.  Heart is at the upper limits of normal in size.  No pericardial effusion.  Left lower lobe nodule measures 9 mm (previously 7 mm).  Added density in the left lung is due in part to expiratory phase imaging.  There are some areas of more confluent  ground-glass in the left upper lobe.  No left pleural fluid.  Right upper and right lower lobe bronchi are obstructed.  Airway is otherwise unremarkable.  Incidental imaging of the upper abdomen shows no acute findings. Retrocrural adenopathy measures up to 10 mm.  Sclerotic lesions are seen throughout the visualized osseous structures.  IMPRESSION:  1.  No definite pulmonary embolus. 2.  Slight progression of stage IV primary bronchogenic carcinoma, when compared baseline examination of 08/30/2011. 3.  Slight interval decrease in size of a presumably malignant right pleural effusion after intervening thoracentesis.   Original Report Authenticated By: Reyes Ivan, M.D.   US Renal 10/08/2011  *RADIOLOGY REPORT*  Clinical Data: Renal failure  RENAL/URINARY TRACT ULTRASOUND COMPLETE  Comparison:  None.  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  No mass or hydronephrosis.  Left Kidney:  Measures 12.4 cm.  No mass or hydronephrosis.  Bladder:  Decompressed by indwelling Foley catheter.  IMPRESSION: Negative renal ultrasound.  No hydronephrosis.  Original Report Authenticated By: Charline Bills, M.D.   Nm Pet Image Initial (pi) Skull Base To Thigh 09/20/2011  *RADIOLOGY REPORT*  Clinical Data: Initial treatment strategy for lung cancer.  NUCLEAR MEDICINE PET SKULL BASE TO THIGH  Fasting Blood Glucose:  86  Technique:  19.6 mCi F-18 FDG was injected intravenously. CT data was obtained and used for attenuation correction and anatomic localization only.  (This was not acquired as a diagnostic CT examination.) Additional exam technical data entered on technologist worksheet.  Comparison:  CT chest dated 08/30/2011  Findings:  Neck: No hypermetabolic lymph nodes in the neck.  Chest:  Near complete collapse of the right lung with associated central obstructing mass/hypermetabolism, max SUV 7.4, corresponding to known lung adenocarcinoma.  8 and 12 mm left lower lobe pulmonary nodules (series 2/images 110 and 117), without convincing hypermetabolism.  1.9 cm right prevascular node (series 2/image 92), max SUV 17.3. Additional right paratracheal and subcarinal nodal metastases, max SUV 6.9 (PET image 97).  Large right pleural effusion with associated pleural thickening and internal debris (series 2/image 132), suggesting malignant effusion.  Foci of gas posteriorly (series 2/image 101), possibly related to prior thoracentesis, although not nondependent.  Abdomen/Pelvis:  No abnormal hypermetabolic activity within the liver, pancreas, adrenal glands, or spleen.   Malrotated right kidney.  Mildly heterogeneous uterus, possibly reflecting uterine fibroids.  Small fat-containing left inguinal hernia.  No hypermetabolic lymph nodes in the abdomen or pelvis.  [place]:  Diffuse sclerotic osseous metastases throughout the visualized axial and appendicular skeleton.  Associated hypermetabolism in the right sternum, max SUV 7.4 (PET image 82).  IMPRESSION: Central obstructing mass in the right lung with near complete collapse and associated hypermetabolism, max SUV 7.4, corresponding to known lung adenocarcinoma.  Right prevascular, paratracheal, and subcarinal nodal metastases, as described above, max SUV 17.3.  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  Diffuse sclerotic osseous metastases throughout the visualized axial and appendicular skeleton, max SUV 7.4.  Original Report Authenticated By: Charline Bills, M.D.   Dg Chest Port 1 View 10/08/2011  *RADIOLOGY REPORT*  Clinical Data: Lung mass.  Pleural effusion.  Hypertension.  PORTABLE CHEST - 1 VIEW  Comparison: 10/04/2011  Findings: The patient is rotated to the right on today's exam, resulting in reduced diagnostic sensitivity and specificity. Complete opacification of the right hemithorax noted, with bowel to the right of the heart indicating the known large anterior diaphragmatic hernia.  The patient has known left-sided  pulmonary nodules based on recent CT scan; these are only apparent is a vague lingular densities on chest radiography.  No pneumothorax observed.  IMPRESSION: 1.  Complete opacification of the right hemithorax, similar to the appearance from CT scan for days ago.  2.  Bowel gas projects to the right of the heart, compatible with the known large right anterior diaphragmatic hernia.  3.  Vague densities in the left lung correspond to previously seen pulmonary nodules.  Original Report Authenticated By: Dellia Cloud, M.D.    Microbiology: Recent Results (from the past 240 hour(s))    TECHNOLOGIST REVIEW     Status: Normal   Collection Time   10/07/11  9:33 AM      Component Value Range Status Comment   Technologist Review 3 %nRBC   Final   URINE CULTURE     Status: Normal   Collection Time   10/08/11  4:54 PM      Component Value Range Status Comment   Specimen Description URINE, CATHETERIZED   Final    Special Requests NONE   Final    Culture  Setup Time 10/09/2011 01:23   Final    Colony Count 70,000 COLONIES/ML   Final    Culture     Final    Value: LACTOBACILLUS SPECIES     Note: Standardized susceptibility testing for this organism is not available.   Report Status 10/10/2011 FINAL   Final      Labs: Basic Metabolic Panel:  Lab 10/13/11 1478 10/12/11 0454 10/10/11 0538 10/09/11 0533 10/08/11 1648  NA 139 140 134* 130* 126*  K 3.7 3.3* 3.9 4.4 4.4  CL 98 99 97 90* 84*  CO2 32 32 25 24 22   GLUCOSE 80 104* 87 109* 116*  BUN 12 10 30* 57* 52*  CREATININE 0.62 0.53 0.62 2.55* 4.69*  CALCIUM 8.8 8.7 8.5 8.8 8.8  MG -- -- -- -- 2.3  PHOS -- -- -- -- 7.1*   Liver Function Tests:  Lab 10/10/11 0538 10/09/11 0533 10/08/11 1648  AST 70* 175* 204*  ALT 80* 109* 83*  ALKPHOS 80 90 96  BILITOT 0.3 0.3 0.4  PROT 6.9 7.8 8.4*  ALBUMIN 2.3* 2.9* 3.2*   CBC:  Lab 10/12/11 0454 10/10/11 0538 10/08/11 1648  WBC 11.1* 9.4 15.1*  NEUTROABS -- -- 11.9*  HGB 8.9* 7.1* 8.0*  HCT 28.5* 22.8* 25.5*  MCV 83.1 83.5 82.0  PLT 386 437* 588*   Cardiac Enzymes:  Lab 10/08/11 1648  CKTOTAL --  CKMB --  CKMBINDEX --  TROPONINI <0.30   BNP: BNP (last 3 results)  Basename 10/08/11 1648  PROBNP 17339.0*   CBG:  Lab 10/09/11 0814  GLUCAP 114*    Time coordinating discharge: Over 30 minutes  Signed:  Manson Passey, MD  [facility] 10/14/2011, 10:17 AM  Pager #: [phone]

## 2011-10-15 ENCOUNTER — Ambulatory Visit
Admission: RE | Admit: 2011-10-15 | Discharge: 2011-10-15 | Disposition: A | Discharge status: Home / Self Care | Payer: 59 | Source: Ambulatory Visit | Attending: Radiation Oncology | Admitting: Radiation Oncology

## 2011-10-15 LAB — AFB CULTURE WITH SMEAR (NOT AT ARMC)

## 2011-10-18 ENCOUNTER — Telehealth: Payer: Self-pay | Admitting: Internal Medicine

## 2011-10-18 ENCOUNTER — Ambulatory Visit
Admission: RE | Admit: 2011-10-18 | Discharge: 2011-10-18 | Disposition: A | Discharge status: Home / Self Care | Payer: 59 | Source: Ambulatory Visit | Attending: Radiation Oncology | Admitting: Radiation Oncology

## 2011-10-18 NOTE — Telephone Encounter (Signed)
lm for pt to call

## 2011-10-19 ENCOUNTER — Ambulatory Visit
Admission: RE | Admit: 2011-10-19 | Discharge: 2011-10-19 | Disposition: A | Discharge status: Home / Self Care | Payer: 59 | Source: Ambulatory Visit | Attending: Radiation Oncology | Admitting: Radiation Oncology

## 2011-10-19 VITALS — BP 130/82 | HR 105 | Temp 98.9°F | Resp 20 | Wt 228.8 lb

## 2011-10-19 DIAGNOSIS — C3490 Malignant neoplasm of unspecified part of unspecified bronchus or lung: Secondary | ICD-10-CM

## 2011-10-19 NOTE — Progress Notes (Signed)
Weekly Management Note:  Site:R Lung Current Dose:  1800  cGy Projected Dose: 2700  cGy  Narrative: The patient is seen today for routine under treatment assessment. CBCT/MVCT images/port films were reviewed. The chart was reviewed.   She states that her cough is much improved. She is having less chest pain is now taking less hydrocodone. She will start [name] in the near future.  Physical Examination:  Filed Vitals:   10/19/11 1536  BP: 130/82  Pulse: 105  Temp: 98.9 F (37.2 C)  Resp: 20  .  Weight: 228 lb 12.8 oz (103.783 kg). There still diminished breath sounds along the right lung zone. Left lung is clear.  Impression: Tolerating radiation therapy well.  Plan: Continue radiation therapy as planned.

## 2011-10-19 NOTE — Progress Notes (Signed)
Pt states she has less pain in chest now, no cough but increased SOB, weakness, fatigue, loss of appetite. Not taking Morphine for pain, but taking Hydrocodone approx 3 x/daily w/good relief. She does not wish to see nutritionist, states her weight loss is due to stress, not loss of appetite. In reviewing pt's meds she states she has never picked up [name]. Pt states she is taking samples from Dr Asa Lente office which she thought was chemo med. It appears she is taking Integra samples for her anemia. States she is not taking Magnesium or Potassium. Pt has labs, appt w/med onc Thurs. She states she will bring in all meds for review as she is unsure what she should be taking.

## 2011-10-20 ENCOUNTER — Ambulatory Visit
Admission: RE | Admit: 2011-10-20 | Discharge: 2011-10-20 | Disposition: A | Discharge status: Home / Self Care | Payer: 59 | Source: Ambulatory Visit | Attending: Radiation Oncology | Admitting: Radiation Oncology

## 2011-10-20 ENCOUNTER — Telehealth: Payer: Self-pay | Admitting: *Deleted

## 2011-10-20 NOTE — Telephone Encounter (Signed)
Beth [name], Physical Therapist from [facility] [phone]) left VM stating they have called and left messages 3 times for pt re: referral to PT.  Because she has not returned their calls, they are taking her off the list. FYI

## 2011-10-21 ENCOUNTER — Other Ambulatory Visit: Payer: 59 | Admitting: Lab

## 2011-10-21 ENCOUNTER — Ambulatory Visit: Admission: RE | Admit: 2011-10-21 | Payer: 59 | Source: Ambulatory Visit

## 2011-10-21 ENCOUNTER — Ambulatory Visit: Payer: 59 | Admitting: Internal Medicine

## 2011-10-21 ENCOUNTER — Other Ambulatory Visit: Payer: Self-pay | Admitting: *Deleted

## 2011-10-21 NOTE — Progress Notes (Signed)
Pt called, she cannot come to f/u today because she has a flat tire, per Dr Donnald Garre okay to r/s to next week.  Onc tx schedule filled out  SLJ

## 2011-10-22 ENCOUNTER — Ambulatory Visit
Admission: RE | Admit: 2011-10-22 | Discharge: 2011-10-22 | Disposition: A | Discharge status: Home / Self Care | Payer: 59 | Source: Ambulatory Visit | Attending: Radiation Oncology | Admitting: Radiation Oncology

## 2011-10-22 ENCOUNTER — Telehealth: Payer: Self-pay | Admitting: Internal Medicine

## 2011-10-22 NOTE — Telephone Encounter (Signed)
l/m with 8/6 appt info     aom

## 2011-10-25 ENCOUNTER — Ambulatory Visit: Payer: 59

## 2011-10-26 ENCOUNTER — Encounter: Payer: Self-pay | Admitting: Radiation Oncology

## 2011-10-26 ENCOUNTER — Ambulatory Visit (HOSPITAL_BASED_OUTPATIENT_CLINIC_OR_DEPARTMENT_OTHER): Payer: 59 | Admitting: Internal Medicine

## 2011-10-26 ENCOUNTER — Other Ambulatory Visit: Payer: Self-pay | Admitting: Family Medicine

## 2011-10-26 ENCOUNTER — Ambulatory Visit
Admission: RE | Admit: 2011-10-26 | Discharge: 2011-10-26 | Disposition: A | Discharge status: Home / Self Care | Payer: 59 | Source: Ambulatory Visit | Attending: Radiation Oncology | Admitting: Radiation Oncology

## 2011-10-26 ENCOUNTER — Telehealth: Payer: Self-pay | Admitting: *Deleted

## 2011-10-26 ENCOUNTER — Other Ambulatory Visit (HOSPITAL_BASED_OUTPATIENT_CLINIC_OR_DEPARTMENT_OTHER): Payer: 59 | Admitting: Lab

## 2011-10-26 VITALS — BP 140/93 | HR 144 | Temp 98.8°F | Resp 22 | Wt 215.3 lb

## 2011-10-26 VITALS — BP 127/93 | HR 133 | Resp 20 | Wt 215.0 lb

## 2011-10-26 DIAGNOSIS — C3400 Malignant neoplasm of unspecified main bronchus: Secondary | ICD-10-CM

## 2011-10-26 DIAGNOSIS — C3490 Malignant neoplasm of unspecified part of unspecified bronchus or lung: Secondary | ICD-10-CM

## 2011-10-26 DIAGNOSIS — D509 Iron deficiency anemia, unspecified: Secondary | ICD-10-CM

## 2011-10-26 DIAGNOSIS — C7951 Secondary malignant neoplasm of bone: Secondary | ICD-10-CM

## 2011-10-26 DIAGNOSIS — C7952 Secondary malignant neoplasm of bone marrow: Secondary | ICD-10-CM

## 2011-10-26 DIAGNOSIS — R197 Diarrhea, unspecified: Secondary | ICD-10-CM

## 2011-10-26 LAB — COMPREHENSIVE METABOLIC PANEL
Alkaline Phosphatase: 124 U/L — ABNORMAL HIGH (ref 39–117)
CO2: 31 mEq/L (ref 19–32)
Creatinine, Ser: 0.61 mg/dL (ref 0.50–1.10)
Glucose, Bld: 101 mg/dL — ABNORMAL HIGH (ref 70–99)
Total Bilirubin: 1 mg/dL (ref 0.3–1.2)

## 2011-10-26 LAB — CBC WITH DIFFERENTIAL/PLATELET
BASO%: 0.2 % (ref 0.0–2.0)
Eosinophils Absolute: 0.1 10*3/uL (ref 0.0–0.5)
HCT: 27.3 % — ABNORMAL LOW (ref 34.8–46.6)
LYMPH%: 10.5 % — ABNORMAL LOW (ref 14.0–49.7)
MCHC: 31.3 g/dL — ABNORMAL LOW (ref 31.5–36.0)
MCV: 83 fL (ref 79.5–101.0)
MONO#: 0.7 10*3/uL (ref 0.1–0.9)
MONO%: 8.7 % (ref 0.0–14.0)
NEUT%: 79.2 % — ABNORMAL HIGH (ref 38.4–76.8)
Platelets: 414 10*3/uL — ABNORMAL HIGH (ref 145–400)
WBC: 7.9 10*3/uL (ref 3.9–10.3)

## 2011-10-26 MED ORDER — CLINDAMYCIN PHOSPHATE 1 % EX SOLN: Freq: Two times a day (BID) | CUTANEOUS | Status: DC

## 2011-10-26 NOTE — Telephone Encounter (Signed)
Gave patient appointment for 11-16-2011 labs and md

## 2011-10-26 NOTE — Progress Notes (Signed)
Received patient in the clinic today following treatment for PUT with Dr. Dayton Scrape. Patient is alert and oriented to person, place, and time. No distress noted. Steady gait noted. Pleasant affect noted. Patient denies pain at this time. Patient reports a persistent dry cough. Patient denies having a sore throat or difficulty swallowing. Patient denies skin changes to chest wall but, reports pustules on face are new x1 week. Patient denies changing soaps or detergents. Patient reports increase shortness of breath and fatigue. Reported all findings to Dr. Dayton Scrape.

## 2011-10-26 NOTE — Progress Notes (Signed)
Quick Note:  Call patient with the result and give K-dur 20 meq po qd X 7 ______ 

## 2011-10-26 NOTE — Progress Notes (Signed)
Weekly Management Note:  Site:R Lung Current Dose:  2340  cGy Projected Dose: 2700  cGy  Narrative: The patient is seen today for routine under treatment assessment. CBCT/MVCT images/port films were reviewed. The chart was reviewed.   She states that her right chest discomfort is much improved. She did not take any pain medication yesterday. She recently developed an acne like rash along her face. She has now been on [name] for 2 weeks. Her breathing may be slightly improved. No dysphagia. She is much better spirits.  Physical Examination:  Filed Vitals:   10/26/11 1545  BP: 127/93  Pulse: 133  Resp: 20  .  Weight: 215 lb (97.523 kg). There is an acne like along her face. There remains diminished breath sounds along the right hemithorax.  Impression: Tolerating radiation therapy well. Chest pain continues to improve.  Plan: Continue radiation therapy as planned. She'll see Dr. Shirline Frees today and she'll finish her radiation therapy this Thursday. She'll then return for a one-month followup visit.

## 2011-10-26 NOTE — Progress Notes (Signed)
[facility] [phone]   [phone]  OFFICE PROGRESS NOTE  [name], MD [address] [place] [address]  DIAGNOSIS: Metastatic non-small cell lung cancer, adenocarcinoma with positive EGFR mutation in exon 19 diagnosed in June of 2013  PRIOR THERAPY: None.  CURRENT THERAPY: #1 palliative radiotherapy to the right lung under the care of Dr. Dayton Scrape expected to be completed on 10/29/2011. #2 [name] 150 mg by mouth daily started 10/11/2011.  INTERVAL HISTORY: Jolonda [place] 51 y.o. female returns to the clinic today for followup visit. The patient is doing much better today with no specific complaints. She is tolerating her palliative radiotherapy fairly well. She was also started on [name] 150 mg by mouth daily 2 weeks ago. She is tolerating the treatment with [name] fairly well except for grade 1-2 skin rash mainly on the face. The patient denied having any significant diarrhea. She was also started on treatment with Integra plus 1 capsule by mouth daily for iron deficiency anemia. She denied having any significant chest pain, no cough or hemoptysis. She has no weight loss or night sweats.  MEDICAL HISTORY: Past Medical History  Diagnosis Date  . Asthma   . GERD (gastroesophageal reflux disease)   . Hypertension   . Heart murmur   . Fibromyalgia   . Arthritis of knee, degenerative   . Endocarditis     as teenager  . Complication of anesthesia     difficulty waking up  . Shortness of breath   . Lung mass     R- ADENOCARCINOMA  . Sleep apnea     no longer using CPAP  . Pleural effusion 08/30/11  . 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 Anxiety   . Depression     ALLERGIES:  is allergic to meloxicam.  MEDICATIONS:  Current Outpatient Prescriptions  Medication Sig Dispense Refill  . ARIPiprazole (ABILIFY) 5 MG tablet Take 5 mg by mouth daily.      . chlorpheniramine-HYDROcodone (TUSSIONEX) 10-8 MG/5ML  LQCR Take 5 mLs by mouth every 12 (twelve) hours as needed.  140 mL  0  . erlotinib ([name]) 150 MG tablet Take 1 tablet (150 mg total) by mouth daily. Take on an empty stomach 1 hour before meals or 2 hours after.  30 tablet  2  . Fe Fum-FePoly-Vit C-Vit B3 (INTEGRA) 62.5-62.5-40-3 MG CAPS Take 1 tablet by mouth daily.      . fish oil-omega-3 fatty acids 1000 MG capsule Take 2 g by mouth daily.      . folic acid (FOLVITE) 1 MG tablet Take 1 tablet (1 mg total) by mouth daily.  30 tablet  1  . hydrochlorothiazide (HYDRODIURIL) 25 MG tablet Take 25 mg by mouth daily.      Marland Kitchen KLOR-CON M10 10 MEQ tablet       . montelukast (SINGULAIR) 10 MG tablet       . Multiple Vitamins-Minerals (MULTIVITAMIN WITH MINERALS) tablet Take 1 tablet by mouth daily.      . Venlafaxine HCl 150 MG TB24 Take 150 mg by mouth daily.      . Vitamin D, Ergocalciferol, (DRISDOL) 50000 UNITS CAPS Take 50,000 Units by mouth every 7 (seven) days. On wednesdays      . clindamycin (CLEOCIN-T) 1 % external solution Apply topically 2 (two) times daily.  240 mL  0  . cyclobenzaprine (FLEXERIL) 5 MG tablet Take 1 tablet (5 mg total) by mouth daily  as needed for muscle spasms.  30 tablet  0  . dexamethasone (DECADRON) 4 MG tablet       . diphenhydrAMINE (BENADRYL) 25 mg capsule One to two tabs every 6 hours prn allergies  120 capsule  3  . HYDROcodone-acetaminophen (VICODIN) 5-500 MG per tablet       . lamoTRIgine (LAMICTAL) 25 MG tablet Take 25 mg by mouth daily.      Marland Kitchen morphine (MSIR) 15 MG tablet       . oxyCODONE-acetaminophen (PERCOCET/ROXICET) 5-325 MG per tablet       . polyethylene glycol (MIRALAX / GLYCOLAX) packet       . potassium chloride (K-DUR) 10 MEQ tablet Take 1 tablet (10 mEq total) by mouth 2 (two) times daily.  60 tablet  3  . prochlorperazine (COMPAZINE) 10 MG tablet Take 1 tablet (10 mg total) by mouth every 6 (six) hours as needed.  30 tablet  1    SURGICAL HISTORY:  Past Surgical History  Procedure Date  .  Tubal ligation 2002  . Thoracentesis 09/02/11, 09/09/11    right-side pleural effusion    REVIEW OF SYSTEMS:  A comprehensive review of systems was negative except for: Respiratory: positive for dyspnea on exertion Skin rash mainly on the face   PHYSICAL EXAMINATION: General appearance: alert, cooperative and no distress Head: Normocephalic, without obvious abnormality, atraumatic Neck: no adenopathy Lymph nodes: Cervical, supraclavicular, and axillary nodes normal. Resp: clear to auscultation bilaterally Back: symmetric, no curvature. ROM normal. No CVA tenderness. Cardio: Tachycardic GI: soft, non-tender; bowel sounds normal; no masses,  no organomegaly Extremities: extremities normal, atraumatic, no cyanosis or edema Neurologic: Alert and oriented X 3, normal strength and tone. Normal symmetric reflexes. Normal coordination and gait  ECOG PERFORMANCE STATUS: 1 - Symptomatic but completely ambulatory  Blood pressure 140/93, pulse 144, temperature 98.8 F (37.1 C), temperature source Oral, resp. rate 22, weight 215 lb 4.8 oz (97.659 kg), last menstrual period 09/28/2011.  LABORATORY DATA: Lab Results  Component Value Date   WBC 7.9 10/26/2011   HGB 8.6* 10/26/2011   HCT 27.3* 10/26/2011   MCV 83.0 10/26/2011   PLT 414* 10/26/2011      Chemistry      Component Value Date/Time   NA 139 10/13/2011 0445   K 3.7 10/13/2011 0445   CL 98 10/13/2011 0445   CO2 32 10/13/2011 0445   BUN 12 10/13/2011 0445   CREATININE 0.62 10/13/2011 0445      Component Value Date/Time   CALCIUM 8.8 10/13/2011 0445   ALKPHOS 80 10/10/2011 0538   AST 70* 10/10/2011 0538   ALT 80* 10/10/2011 0538   BILITOT 0.3 10/10/2011 0538       RADIOGRAPHIC STUDIES: Ct Angio Chest W/cm &/or Wo Cm  10/11/2011  *RADIOLOGY REPORT*  Clinical Data:  Shortness of breath and elevated D-dimer.  Known central obstructing mass on the right causing collapse of the right lung by prior CT.  CT ANGIOGRAPHY CHEST WITH CONTRAST  Technique:   Multidetector CT imaging of the chest was performed using the standard protocol during bolus administration of intravenous contrast.  Multiplanar CT image reconstructions including MIPs were obtained to evaluate the vascular anatomy.  Contrast: OMNIPAQUE IOHEXOL 350 MG/ML SOLN 100 ml Omnipaque 350 IV  Comparison:  10/04/2011  Findings:  Stable complete collapse of the right lung with some degree of surrounding pleural fluid and central obstructing tumor. Aeration of the left lung also stable.  No evidence of pulmonary embolism in  the left lung.  Left-sided airways are normally patent. No left pleural fluid.  Stable cardiomegaly.  Review of the MIP images confirms the above findings.  IMPRESSION: Stable complete collapse of right lung due to a central obstructing tumor.  No evidence of acute pulmonary embolism.  Original Report Authenticated By: Reola Calkins, M.D.   Ct Angio Chest W/cm &/or Wo Cm  10/04/2011  *RADIOLOGY REPORT*  Clinical Data: Lung cancer with chest pain and shortness of breath. Evaluate for pulmonary embolus versus pleural effusion.  CT ANGIOGRAPHY CHEST  Technique:  Multidetector CT imaging of the chest using the standard protocol during bolus administration of intravenous contrast. Multiplanar reconstructed images including MIPs were obtained and reviewed to evaluate the vascular anatomy.  Contrast: OMNIPAQUE IOHEXOL 350 MG/ML SOLN  Comparison: PET CT 09/20/2011 and CT chest 08/30/2011.  Findings: No definite pulmonary embolus.  Mediastinal adenopathy measures up to 1.7 cm in the AP window (previously 1.3 cm).  There is a centrally obstructing ill-defined mass in the right lung measuring approximately 9.2 x 6.6 cm (previously 8.8 x 6.1 cm).  A large complex collection of pleural fluid occupies the right hemithorax, with a thick rind of soft tissue. Overall size has decreased slightly from 08/30/2011.  A single locule of air is seen posteriorly within the right pleural space,  which may be due to intervening thoracentesis. Pulmonary arteries are enlarged.  Heart is at the upper limits of normal in size.  No pericardial effusion.  Left lower lobe nodule measures 9 mm (previously 7 mm).  Added density in the left lung is due in part to expiratory phase imaging.  There are some areas of more confluent ground-glass in the left upper lobe.  No left pleural fluid.  Right upper and right lower lobe bronchi are obstructed.  Airway is otherwise unremarkable.  Incidental imaging of the upper abdomen shows no acute findings. Retrocrural adenopathy measures up to 10 mm.  Sclerotic lesions are seen throughout the visualized osseous structures.  IMPRESSION:  1.  No definite pulmonary embolus. 2.  Slight progression of stage IV primary bronchogenic carcinoma, when compared baseline examination of 08/30/2011. 3.  Slight interval decrease in size of a presumably malignant right pleural effusion after intervening thoracentesis.  Original Report Authenticated By: Reyes Ivan, M.D.   US Renal  10/08/2011  *RADIOLOGY REPORT*  Clinical Data: Renal failure  RENAL/URINARY TRACT ULTRASOUND COMPLETE  Comparison:  None.  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  No mass or hydronephrosis.  Left Kidney:  Measures 12.4 cm.  No mass or hydronephrosis.  Bladder:  Decompressed by indwelling Foley catheter.  IMPRESSION: Negative renal ultrasound.  No hydronephrosis.  Original Report Authenticated By: Charline Bills, M.D.   Dg Chest Port 1 View  10/08/2011  *RADIOLOGY REPORT*  Clinical Data: Lung mass.  Pleural effusion.  Hypertension.  PORTABLE CHEST - 1 VIEW  Comparison: 10/04/2011  Findings: The patient is rotated to the right on today's exam, resulting in reduced diagnostic sensitivity and specificity. Complete opacification of the right hemithorax noted, with bowel to the right of the heart indicating the known large anterior  diaphragmatic hernia.  The patient has known left-sided pulmonary nodules based on recent CT scan; these are only apparent is a vague lingular densities on chest radiography.  No pneumothorax observed.  IMPRESSION: 1.  Complete opacification of the right hemithorax, similar to the appearance from CT scan for days ago.  2.  Bowel gas projects to the right of the heart, compatible with the known large right anterior diaphragmatic hernia.  3.  Vague densities in the left lung correspond to previously seen pulmonary nodules.  Original Report Authenticated By: Dellia Cloud, M.D.    ASSESSMENT: This is a very pleasant 51 years old African American female recently diagnosed with metastatic non-small cell lung cancer, adenocarcinoma with positive EGFR mutation currently undergoing palliative radiotherapy to the right lung mass in addition to treatment with oral [name]. The patient is tolerating her treatment fairly well and symptomatically she is improving.  PLAN: I recommended for the patient to continue treatment was [name] at the current dose.  For the skin rash, I would start her on clindamycin 1% lotion to be applied twice a day. She was advised for diarrhea 1-2 mg by mouth after every loose stool for a maximum of 16 mg by mouth daily. The patient would come back for followup visit in 3 weeks for reevaluation and management any adverse effect of her treatment. She was advised to call immediately if she has any concerning symptoms in the interval.  All questions were answered. The patient knows to call the clinic with any problems, questions or concerns. We can certainly see the patient much sooner if necessary.  I spent 15 minutes counseling the patient face to face. The total time spent in the appointment was 25 minutes.

## 2011-10-27 ENCOUNTER — Telehealth: Payer: Self-pay | Admitting: Medical Oncology

## 2011-10-27 ENCOUNTER — Telehealth: Payer: Self-pay | Admitting: Radiation Oncology

## 2011-10-27 ENCOUNTER — Ambulatory Visit: Payer: 59

## 2011-10-27 MED ORDER — POTASSIUM CHLORIDE CRYS ER 20 MEQ PO TBCR: 20.0000 meq | EXTENDED_RELEASE_TABLET | Freq: Every day | ORAL | Status: DC

## 2011-10-27 NOTE — Telephone Encounter (Signed)
Called to pharmacy and pt's mother

## 2011-10-27 NOTE — Telephone Encounter (Signed)
Message copied by Charma Igo on Wed Oct 27, 2011  8:52 AM ------      Message from: Si Gaul      Created: Tue Oct 26, 2011  8:23 PM       Call patient with the result and give K dur 20 meq po qd X 7

## 2011-10-27 NOTE — Telephone Encounter (Signed)
Patient phoned today to inform staff she would not be present for today's radiation treatment. Patient offered no explanation for why. Patient confirms she will be present for Friday but, has asked that the therapist phone her with a time. Contact Katie, RT RT, with this information and she verbalized she would handle it. Routed this message to Dr. Dayton Scrape and Corrie Dandy, RN.

## 2011-10-28 ENCOUNTER — Ambulatory Visit: Admission: RE | Admit: 2011-10-28 | Payer: 59 | Source: Ambulatory Visit

## 2011-10-29 ENCOUNTER — Ambulatory Visit: Payer: 59

## 2011-10-29 ENCOUNTER — Telehealth: Payer: Self-pay | Admitting: Medical Oncology

## 2011-10-29 NOTE — Telephone Encounter (Signed)
Pt mother said pt taking potassium

## 2011-10-29 NOTE — Telephone Encounter (Signed)
Message copied by Charma Igo on Fri Oct 29, 2011 12:35 PM ------      Message from: Si Gaul      Created: Tue Oct 26, 2011  8:23 PM       Call patient with the result and give K dur 20 meq po qd X 7

## 2011-10-29 NOTE — Telephone Encounter (Signed)
She started potassium dose increase yesterday and restated correct dose x 7 days

## 2011-11-01 ENCOUNTER — Ambulatory Visit
Admission: RE | Admit: 2011-11-01 | Discharge: 2011-11-01 | Disposition: A | Discharge status: Home / Self Care | Payer: 59 | Source: Ambulatory Visit | Attending: Radiation Oncology | Admitting: Radiation Oncology

## 2011-11-01 ENCOUNTER — Encounter: Payer: Self-pay | Admitting: Radiation Oncology

## 2011-11-01 ENCOUNTER — Telehealth: Payer: Self-pay | Admitting: Medical Oncology

## 2011-11-01 VITALS — BP 115/79 | HR 124 | Temp 98.8°F | Resp 20 | Wt 214.5 lb

## 2011-11-01 DIAGNOSIS — C3490 Malignant neoplasm of unspecified part of unspecified bronchus or lung: Secondary | ICD-10-CM

## 2011-11-01 MED ORDER — DOXYCYCLINE HYCLATE 50 MG PO CAPS
100.0000 mg | ORAL_CAPSULE | Freq: Two times a day (BID) | ORAL | Status: AC
Start: 2011-11-01 — End: 2011-11-11

## 2011-11-01 NOTE — Telephone Encounter (Signed)
States rash on face is worse, Has been using clindamycin since Thursday and it looks worse

## 2011-11-01 NOTE — Telephone Encounter (Signed)
Per Dr Donnald Garre I  Called in doxycycline and pt notified

## 2011-11-01 NOTE — Progress Notes (Signed)
Pt denies pain, fatigue although she states she does rest a lot. Has loss of appetite due to "bad taste in my mouth". Occassional dry cough, no SOB.

## 2011-11-01 NOTE — Progress Notes (Signed)
   Weekly Management Note Current Dose:   25.2 Gy  Projected Dose: 27 Gy   Narrative:  The patient presents for routine under treatment assessment.  CBCT/MVCT images/Port film x-rays were reviewed.  The chart was checked. She denies any new shortness of breath or pain. She has an acneiform rash over her face from [name].  Physical Findings:  weight is 214 lb 8 oz (97.297 kg). Her oral temperature is 98.8 F (37.1 C). Her blood pressure is 115/79 and her pulse is 124. Her respiration is 20 and oxygen saturation is 100%.  Sitting comfortably in a chair in no acute distress. No skin reaction thus far over her chest or back from the radiotherapy  Impression:  The patient is tolerating radiotherapy.  Plan:  Continue radiotherapy as planned. She'll finish tomorrow. One-month followup has been scheduled.  ________________________________   Lonie Peak, M.D.

## 2011-11-02 ENCOUNTER — Ambulatory Visit: Payer: 59

## 2011-11-03 ENCOUNTER — Telehealth: Payer: Self-pay | Admitting: *Deleted

## 2011-11-03 NOTE — Telephone Encounter (Signed)
Left vm for pt who has cancelled her past 2 radiation treatments. Requested callback, left #.

## 2011-11-04 ENCOUNTER — Ambulatory Visit
Admission: RE | Admit: 2011-11-04 | Discharge: 2011-11-04 | Disposition: A | Discharge status: Home / Self Care | Payer: 59 | Source: Ambulatory Visit | Attending: Radiation Oncology | Admitting: Radiation Oncology

## 2011-11-04 NOTE — Telephone Encounter (Signed)
Called pt , left vm again reminding pt of her last radiation treatment today @ 1:30 pm.

## 2011-11-07 ENCOUNTER — Encounter: Payer: Self-pay | Admitting: Radiation Oncology

## 2011-11-07 NOTE — Progress Notes (Signed)
[facility] Radiation Oncology End of Treatment Note  Name:Beth [name]  Date: 11/07/2011 ZOX:[id number] DOB:12-29-1960   Status:outpatient/inpatient    CC: [name], MD  , Dr. Si Gaul  REFERRING PHYSICIAN: Dr. Spero Geralds    DIAGNOSIS: Stage IV adenocarcinoma the right lung   INDICATION FOR TREATMENT: Palliative   TREATMENT DATES: 09/29/2011 through 11/04/2011                           SITE/DOSE:   Right chest/right lung 2700 cGy 15 sessions                         BEAMS/ENERGY: 15 MV photons parallel opposed anterior and posterior fields                  NARRATIVE:  The patient tolerated treatment well with improvement of her right chest discomfort by completion of therapy. Her dyspnea remains unchanged without reexpansion of her right lung during her course of therapy.                          PLAN: Routine followup in one month. Patient instructed to call if questions or worsening complaints in interim. She'll see Dr. Arbutus Ped for discussion of further systemic therapy.

## 2011-11-12 ENCOUNTER — Other Ambulatory Visit: Payer: Self-pay | Admitting: *Deleted

## 2011-11-12 ENCOUNTER — Ambulatory Visit: Payer: 59 | Admitting: Family Medicine

## 2011-11-15 ENCOUNTER — Telehealth: Payer: Self-pay | Admitting: Internal Medicine

## 2011-11-15 NOTE — Telephone Encounter (Signed)
l/m that appt had been rs/ ,left new appt info

## 2011-11-16 ENCOUNTER — Other Ambulatory Visit: Payer: 59 | Admitting: Lab

## 2011-11-16 ENCOUNTER — Ambulatory Visit: Payer: 59 | Admitting: Internal Medicine

## 2011-11-18 ENCOUNTER — Ambulatory Visit (HOSPITAL_BASED_OUTPATIENT_CLINIC_OR_DEPARTMENT_OTHER): Payer: 59 | Admitting: Physician Assistant

## 2011-11-18 ENCOUNTER — Other Ambulatory Visit (HOSPITAL_BASED_OUTPATIENT_CLINIC_OR_DEPARTMENT_OTHER): Payer: 59 | Admitting: Lab

## 2011-11-18 VITALS — BP 134/84 | HR 117 | Temp 98.2°F | Resp 20 | Ht 66.0 in | Wt 214.5 lb

## 2011-11-18 DIAGNOSIS — C3400 Malignant neoplasm of unspecified main bronchus: Secondary | ICD-10-CM

## 2011-11-18 DIAGNOSIS — R21 Rash and other nonspecific skin eruption: Secondary | ICD-10-CM

## 2011-11-18 DIAGNOSIS — C3490 Malignant neoplasm of unspecified part of unspecified bronchus or lung: Secondary | ICD-10-CM

## 2011-11-18 DIAGNOSIS — K219 Gastro-esophageal reflux disease without esophagitis: Secondary | ICD-10-CM

## 2011-11-18 DIAGNOSIS — C7952 Secondary malignant neoplasm of bone marrow: Secondary | ICD-10-CM

## 2011-11-18 LAB — CBC WITH DIFFERENTIAL/PLATELET
Basophils Absolute: 0 10*3/uL (ref 0.0–0.1)
Eosinophils Absolute: 0.1 10*3/uL (ref 0.0–0.5)
HCT: 31.2 % — ABNORMAL LOW (ref 34.8–46.6)
HGB: 9.8 g/dL — ABNORMAL LOW (ref 11.6–15.9)
LYMPH%: 25.5 % (ref 14.0–49.7)
MCV: 83 fL (ref 79.5–101.0)
MONO#: 0.4 10*3/uL (ref 0.1–0.9)
NEUT#: 3 10*3/uL (ref 1.5–6.5)
NEUT%: 63.6 % (ref 38.4–76.8)
Platelets: 323 10*3/uL (ref 145–400)
WBC: 4.7 10*3/uL (ref 3.9–10.3)

## 2011-11-18 LAB — COMPREHENSIVE METABOLIC PANEL (CC13)
BUN: 7 mg/dL (ref 7.0–26.0)
CO2: 27 mEq/L (ref 22–29)
Creatinine: 0.7 mg/dL (ref 0.6–1.1)
Glucose: 96 mg/dl (ref 70–99)
Sodium: 140 mEq/L (ref 136–145)
Total Bilirubin: 0.8 mg/dL (ref 0.20–1.20)
Total Protein: 6.8 g/dL (ref 6.4–8.3)

## 2011-11-18 NOTE — Patient Instructions (Addendum)
Continue on [name] 150 mg by mouth daily May take Zantac or Pepcid as needed for reflux symptoms Follow up with Dr. Arbutus Ped in one month for another symptom management visit

## 2011-11-19 ENCOUNTER — Telehealth: Payer: Self-pay | Admitting: Internal Medicine

## 2011-11-19 NOTE — Telephone Encounter (Signed)
lmonvm adviisng the pt that her appts are scheduled for sept 25th@9 :15am

## 2011-11-25 ENCOUNTER — Encounter: Payer: Self-pay | Admitting: Radiation Oncology

## 2011-11-26 NOTE — Progress Notes (Signed)
[facility] [phone]   [phone]  OFFICE PROGRESS NOTE  [name], MD [address] [place] [address]  DIAGNOSIS: Metastatic non-small cell lung cancer, adenocarcinoma with positive EGFR mutation in exon 19 diagnosed in June of 2013  PRIOR THERAPY: None.  CURRENT THERAPY: #1 palliative radiotherapy to the right lung under the care of Dr. Dayton Scrape expected to be completed on 10/29/2011. #2 [name] 150 mg by mouth daily started 10/11/2011.  INTERVAL HISTORY: Beth [name] 51 y.o. female returns to the clinic today for followup visit.  She is tolerating the treatment with [name] fairly well except for grade 1-2 skin rash mainly on the face. She reports that her eyes have been "runny and itchy" and sometimes stick together. She is having some few episodes of diarrhea but finds them to be tolerable/manageable. She does occasionally have to take Imodium. She reports that she is using oxygen only at night. The patient denied having any significant diarrhea. She continues on treatment with Integra plus 1 capsule by mouth daily for iron deficiency anemia. She denied having any significant chest pain, no cough or hemoptysis. She has no weight loss or night sweats. She does have some intermittent reflux symptoms and is no longer on a PPI. She is wondering what she can take over-the-counter for the symptoms. She requests an application for handicap parking decal.  MEDICAL HISTORY: Past Medical History  Diagnosis Date  . Asthma   . GERD (gastroesophageal reflux disease)   . Hypertension   . Heart murmur   . Fibromyalgia   . Arthritis of knee, degenerative   . Endocarditis     as teenager  . Complication of anesthesia     difficulty waking up  . Shortness of breath   . Lung mass     R- ADENOCARCINOMA  . Sleep apnea     no longer using CPAP  . Pleural effusion 08/30/11  . 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 Anxiety     . Depression   . History of radiation therapy 09/29/11-11/04/2011    right lung 2700cGy 15 sessions    ALLERGIES:  is allergic to meloxicam.  MEDICATIONS:  Current Outpatient Prescriptions  Medication Sig Dispense Refill  . ABILIFY 10 MG tablet TAKE 1 TABLET BY MOUTH EVERY MORNING  30 tablet  11  . ARIPiprazole (ABILIFY) 5 MG tablet Take 5 mg by mouth daily.      . chlorpheniramine-HYDROcodone (TUSSIONEX) 10-8 MG/5ML LQCR Take 5 mLs by mouth every 12 (twelve) hours as needed.  140 mL  0  . clindamycin (CLEOCIN-T) 1 % external solution Apply topically 2 (two) times daily.  240 mL  0  . cyclobenzaprine (FLEXERIL) 5 MG tablet Take 1 tablet (5 mg total) by mouth daily as needed for muscle spasms.  30 tablet  0  . dexamethasone (DECADRON) 4 MG tablet       . diphenhydrAMINE (BENADRYL) 25 mg capsule One to two tabs every 6 hours prn allergies  120 capsule  3  . doxycycline (VIBRA-TABS) 100 MG tablet       . Fe Fum-FePoly-Vit C-Vit B3 (INTEGRA) 62.5-62.5-40-3 MG CAPS Take 1 tablet by mouth daily.      . fish oil-omega-3 fatty acids 1000 MG capsule Take 2 g by mouth daily.      . folic acid (FOLVITE) 1 MG tablet Take 1 tablet (1 mg total) by mouth daily.  30 tablet  1  .  hydrochlorothiazide (HYDRODIURIL) 25 MG tablet Take 25 mg by mouth daily.      Marland Kitchen HYDROcodone-acetaminophen (VICODIN) 5-500 MG per tablet       . KLOR-CON M10 10 MEQ tablet       . lamoTRIgine (LAMICTAL) 25 MG tablet TAKE 2 TABLETS BY MOUTH AT BEDTIME  60 tablet  11  . montelukast (SINGULAIR) 10 MG tablet       . morphine (MSIR) 15 MG tablet       . Multiple Vitamins-Minerals (MULTIVITAMIN WITH MINERALS) tablet Take 1 tablet by mouth daily.      Marland Kitchen oxyCODONE-acetaminophen (PERCOCET/ROXICET) 5-325 MG per tablet       . polyethylene glycol (MIRALAX / GLYCOLAX) packet       . potassium chloride (K-DUR) 10 MEQ tablet Take 1 tablet (10 mEq total) by mouth 2 (two) times daily.  60 tablet  3  . potassium chloride SA (K-DUR,KLOR-CON) 20  MEQ tablet Take 1 tablet (20 mEq total) by mouth daily.  7 tablet  0  . prochlorperazine (COMPAZINE) 10 MG tablet Take 1 tablet (10 mg total) by mouth every 6 (six) hours as needed.  30 tablet  1  . [name] 150 MG tablet       . Venlafaxine HCl 150 MG TB24 Take 150 mg by mouth daily.      . Vitamin D, Ergocalciferol, (DRISDOL) 50000 UNITS CAPS Take 50,000 Units by mouth every 7 (seven) days. On wednesdays        SURGICAL HISTORY:  Past Surgical History  Procedure Date  . Tubal ligation 2002  . Thoracentesis 09/02/11, 09/09/11    right-side pleural effusion    REVIEW OF SYSTEMS:  A comprehensive review of systems was negative except for: Eyes: positive for irritation Respiratory: positive for dyspnea on exertion Gastrointestinal: positive for diarrhea Skin rash mainly on the face   PHYSICAL EXAMINATION: General appearance: alert, cooperative and no distress Head: Normocephalic, without obvious abnormality, atraumatic Neck: no adenopathy Lymph nodes: Cervical, supraclavicular, and axillary nodes normal. Resp: clear to auscultation bilaterally Back: symmetric, no curvature. ROM normal. No CVA tenderness. Cardio: Tachycardic GI: soft, non-tender; bowel sounds normal; no masses,  no organomegaly Extremities: extremities normal, atraumatic, no cyanosis or edema Neurologic: Alert and oriented X 3, normal strength and tone. Normal symmetric reflexes. Normal coordination and gait Skin skin: Grade 1-2 acneform eruptions with a greater concentration around the nose, lesions are primarily on the face with only a very few lesions on the upper back  ECOG PERFORMANCE STATUS: 1 - Symptomatic but completely ambulatory  Blood pressure 134/84, pulse 117, temperature 98.2 F (36.8 C), temperature source Oral, resp. rate 20, height 5\' 6"  (1.676 m), weight 214 lb 8 oz (97.297 kg).  LABORATORY DATA: Lab Results  Component Value Date   WBC 4.7 11/18/2011   HGB 9.8* 11/18/2011   HCT 31.2* 11/18/2011    MCV 83.0 11/18/2011   PLT 323 11/18/2011      Chemistry      Component Value Date/Time   NA 140 11/18/2011 1405   NA 137 10/26/2011 1616   K 3.4* 11/18/2011 1405   K 3.2* 10/26/2011 1616   CL 104 11/18/2011 1405   CL 97 10/26/2011 1616   CO2 27 11/18/2011 1405   CO2 31 10/26/2011 1616   BUN 7.0 11/18/2011 1405   BUN 9 10/26/2011 1616   CREATININE 0.7 11/18/2011 1405   CREATININE 0.61 10/26/2011 1616      Component Value Date/Time   CALCIUM 9.3 10/26/2011  1616   ALKPHOS 120 11/18/2011 1405   ALKPHOS 124* 10/26/2011 1616   AST 15 11/18/2011 1405   AST 32 10/26/2011 1616   ALT 11 11/18/2011 1405   ALT 19 10/26/2011 1616   BILITOT 0.80 11/18/2011 1405   BILITOT 1.0 10/26/2011 1616       RADIOGRAPHIC STUDIES: Ct Angio Chest W/cm &/or Wo Cm  10/11/2011  *RADIOLOGY REPORT*  Clinical Data:  Shortness of breath and elevated D-dimer.  Known central obstructing mass on the right causing collapse of the right lung by prior CT.  CT ANGIOGRAPHY CHEST WITH CONTRAST  Technique:  Multidetector CT imaging of the chest was performed using the standard protocol during bolus administration of intravenous contrast.  Multiplanar CT image reconstructions including MIPs were obtained to evaluate the vascular anatomy.  Contrast: OMNIPAQUE IOHEXOL 350 MG/ML SOLN 100 ml Omnipaque 350 IV  Comparison:  10/04/2011  Findings:  Stable complete collapse of the right lung with some degree of surrounding pleural fluid and central obstructing tumor. Aeration of the left lung also stable.  No evidence of pulmonary embolism in the left lung.  Left-sided airways are normally patent. No left pleural fluid.  Stable cardiomegaly.  Review of the MIP images confirms the above findings.  IMPRESSION: Stable complete collapse of right lung due to a central obstructing tumor.  No evidence of acute pulmonary embolism.  Original Report Authenticated By: Reola Calkins, M.D.   Ct Angio Chest W/cm &/or Wo Cm  10/04/2011  *RADIOLOGY REPORT*  Clinical  Data: Lung cancer with chest pain and shortness of breath. Evaluate for pulmonary embolus versus pleural effusion.  CT ANGIOGRAPHY CHEST  Technique:  Multidetector CT imaging of the chest using the standard protocol during bolus administration of intravenous contrast. Multiplanar reconstructed images including MIPs were obtained and reviewed to evaluate the vascular anatomy.  Contrast: OMNIPAQUE IOHEXOL 350 MG/ML SOLN  Comparison: PET CT 09/20/2011 and CT chest 08/30/2011.  Findings: No definite pulmonary embolus.  Mediastinal adenopathy measures up to 1.7 cm in the AP window (previously 1.3 cm).  There is a centrally obstructing ill-defined mass in the right lung measuring approximately 9.2 x 6.6 cm (previously 8.8 x 6.1 cm).  A large complex collection of pleural fluid occupies the right hemithorax, with a thick rind of soft tissue. Overall size has decreased slightly from 08/30/2011.  A single locule of air is seen posteriorly within the right pleural space, which may be due to intervening thoracentesis. Pulmonary arteries are enlarged.  Heart is at the upper limits of normal in size.  No pericardial effusion.  Left lower lobe nodule measures 9 mm (previously 7 mm).  Added density in the left lung is due in part to expiratory phase imaging.  There are some areas of more confluent ground-glass in the left upper lobe.  No left pleural fluid.  Right upper and right lower lobe bronchi are obstructed.  Airway is otherwise unremarkable.  Incidental imaging of the upper abdomen shows no acute findings. Retrocrural adenopathy measures up to 10 mm.  Sclerotic lesions are seen throughout the visualized osseous structures.  IMPRESSION:  1.  No definite pulmonary embolus. 2.  Slight progression of stage IV primary bronchogenic carcinoma, when compared baseline examination of 08/30/2011. 3.  Slight interval decrease in size of a presumably malignant right pleural effusion after intervening thoracentesis.  Original  Report Authenticated By: Reyes Ivan, M.D.   US Renal  10/08/2011  *RADIOLOGY REPORT*  Clinical Data: Renal failure  RENAL/URINARY TRACT  ULTRASOUND COMPLETE  Comparison:  None.  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  No mass or hydronephrosis.  Left Kidney:  Measures 12.4 cm.  No mass or hydronephrosis.  Bladder:  Decompressed by indwelling Foley catheter.  IMPRESSION: Negative renal ultrasound.  No hydronephrosis.  Original Report Authenticated By: Charline Bills, M.D.   Dg Chest Port 1 View  10/08/2011  *RADIOLOGY REPORT*  Clinical Data: Lung mass.  Pleural effusion.  Hypertension.  PORTABLE CHEST - 1 VIEW  Comparison: 10/04/2011  Findings: The patient is rotated to the right on today's exam, resulting in reduced diagnostic sensitivity and specificity. Complete opacification of the right hemithorax noted, with bowel to the right of the heart indicating the known large anterior diaphragmatic hernia.  The patient has known left-sided pulmonary nodules based on recent CT scan; these are only apparent is a vague lingular densities on chest radiography.  No pneumothorax observed.  IMPRESSION: 1.  Complete opacification of the right hemithorax, similar to the appearance from CT scan for days ago.  2.  Bowel gas projects to the right of the heart, compatible with the known large right anterior diaphragmatic hernia.  3.  Vague densities in the left lung correspond to previously seen pulmonary nodules.  Original Report Authenticated By: Dellia Cloud, M.D.    ASSESSMENT/PLAN: This is a very pleasant 51 years old African American female recently diagnosed with metastatic non-small cell lung cancer, adenocarcinoma with positive EGFR mutation currently undergoing palliative radiotherapy to the right lung mass in addition to treatment with oral [name]. The patient is tolerating her treatment fairly well and symptomatically she is  improving. She is a grade 1-2 skin rash related to the [name] and may use Xanax cream or something similar as needed on her face and her skin emollients of choice for her body. For her intermittent reflux symptoms she may take over-the-counter Zantac or Pepcid. She will continue on the [name] at 150 mg by mouth daily. She'll followup with Dr. Arbutus Ped in one month for a another symptom management visit with a repeat CBC differential and C. met. She was given an application for handicap parking permit.  Laural Benes, Juvon Teater E, PA-C   All questions were answered. The patient knows to call the clinic with any problems, questions or concerns. We can certainly see the patient much sooner if necessary.  I spent 20 minutes counseling the patient face to face. The total time spent in the appointment was 30 minutes.

## 2011-11-30 ENCOUNTER — Encounter: Payer: Self-pay | Admitting: Radiation Oncology

## 2011-11-30 ENCOUNTER — Telehealth: Payer: Self-pay | Admitting: Medical Oncology

## 2011-11-30 ENCOUNTER — Ambulatory Visit
Admission: RE | Admit: 2011-11-30 | Discharge: 2011-11-30 | Disposition: A | Discharge status: Home / Self Care | Payer: 59 | Source: Ambulatory Visit | Attending: Radiation Oncology | Admitting: Radiation Oncology

## 2011-11-30 ENCOUNTER — Other Ambulatory Visit: Payer: Self-pay | Admitting: Family Medicine

## 2011-11-30 VITALS — BP 126/75 | HR 102 | Temp 98.3°F | Resp 20 | Wt 219.2 lb

## 2011-11-30 DIAGNOSIS — C3490 Malignant neoplasm of unspecified part of unspecified bronchus or lung: Secondary | ICD-10-CM

## 2011-11-30 NOTE — Progress Notes (Signed)
Pt denies SOB, pain, loss of appetite, fatigue. She reports dry cough which causes slight pain in right side of back. She no longer takes any pain meds, does not require oxygen.

## 2011-11-30 NOTE — Telephone Encounter (Signed)
Requests to discontinue oxygen -[facility] to [facility]

## 2011-11-30 NOTE — Progress Notes (Signed)
Followup note:  Beth [name] returns today approximately 1 month following completion of a two-week course of palliative radiotherapy to her right chest/lung in the management of her stage IV adenocarcinoma of the right lung. She symptomatically improved and is no longer on oxygen. She continues with her [name] under the direction of Dr. Arbutus Ped. She does have and actiniform skin rash along her face from her [name]. Her cough also remains improved. She was going to move to [place] for terminal care, but she has decided to stay [facility] since she is clinically improved.  Physical examination: She looks well. She is in no respiratory distress. Wt Readings from Last 3 Encounters:  11/30/11 219 lb 3.2 oz (99.428 kg)  11/18/11 214 lb 8 oz (97.297 kg)  11/01/11 214 lb 8 oz (97.297 kg)   Temp Readings from Last 3 Encounters:  11/30/11 98.3 F (36.8 C) Oral  11/18/11 98.2 F (36.8 C) Oral  11/01/11 98.8 F (37.1 C) Oral   BP Readings from Last 3 Encounters:  11/30/11 126/75  11/18/11 134/84  11/01/11 115/79   Pulse Readings from Last 3 Encounters:  11/30/11 102  11/18/11 117  11/01/11 124   O2 sat 100% on room air.  Head and neck examination remarkable for acne-like skin rash along her face. Nodes: Without palpable cervical or supraclavicular lymphadenopathy. Chest: There are diminished breath sounds along the right mid to lower lung zone, but I do here course breath sounds in the right that were not present previously.  Impression: Satisfactory progress with response to radiation therapy and [name].  Plan: She'll maintain her followup through Dr. Arbutus Ped. Dr. Arbutus Ped will obtain a followup chest x-ray/CT scan at the appropriate intervals.

## 2011-11-30 NOTE — Progress Notes (Signed)
Follow up Right lung rad txs: 09/29/11-11/04/11 Alert,oriented x3, [name] 150 mg po daily

## 2011-12-07 ENCOUNTER — Telehealth: Payer: Self-pay | Admitting: Medical Oncology

## 2011-12-07 NOTE — Telephone Encounter (Signed)
Pt states she wants to come in sooner because her cough has started back and she seems to have more SOB. I will put in onc treatment request.

## 2011-12-08 ENCOUNTER — Telehealth: Payer: Self-pay | Admitting: Internal Medicine

## 2011-12-08 NOTE — Telephone Encounter (Signed)
called pt to move her appt and she states that she is feeling much better and will keep her appt on 9/25

## 2011-12-10 ENCOUNTER — Encounter: Payer: Self-pay | Admitting: Family Medicine

## 2011-12-10 ENCOUNTER — Telehealth: Payer: Self-pay | Admitting: Family Medicine

## 2011-12-10 ENCOUNTER — Ambulatory Visit (INDEPENDENT_AMBULATORY_CARE_PROVIDER_SITE_OTHER): Payer: 59 | Admitting: Family Medicine

## 2011-12-10 VITALS — BP 120/84 | Temp 98.0°F | Wt 217.0 lb

## 2011-12-10 DIAGNOSIS — Z23 Encounter for immunization: Secondary | ICD-10-CM

## 2011-12-10 DIAGNOSIS — R079 Chest pain, unspecified: Secondary | ICD-10-CM

## 2011-12-10 DIAGNOSIS — C3490 Malignant neoplasm of unspecified part of unspecified bronchus or lung: Secondary | ICD-10-CM

## 2011-12-10 MED ORDER — OXYCODONE-ACETAMINOPHEN 5-325 MG PO TABS: ORAL_TABLET | ORAL | Status: DC

## 2011-12-10 NOTE — Telephone Encounter (Signed)
noted 

## 2011-12-10 NOTE — Progress Notes (Signed)
Subjective:    Patient ID: Beth [name], female    DOB: 1960-04-18, 51 y.o.   MRN: [id number]  HPI  Patient has stage IV adenocarcinoma of the lung with history of right lung collapse. Has received radiation therapy and undergoing current treatment with [name]. She presents with complaints of some chest wall and right-sided chest pains which been somewhat chronic for the past couple months. She was treated back in the summer at one point with hydrocodone which seemed to lose it's effect.  She eventually was treated with morphine but had itching. Subsequently treated with oxycodone which seemed to help. She has not gotten any apparently since July. Her pain is 6/10-7/10 severity at times. Worse with coughing. No recent hemoptysis. She has poor appetite. Occasional cough but no fever or chills. No increased dyspnea. No leg pain.  Her chest pain has been all right-sided and not related to exertion. No pleuritic pain  Other chronic problems include history of hypertension, past history depression, anemia. She's been followed closely by radiation oncology and medical oncology.  Past Medical History  Diagnosis Date  . Asthma   . GERD (gastroesophageal reflux disease)   . Hypertension   . Heart murmur   . Fibromyalgia   . Arthritis of knee, degenerative   . Endocarditis     as teenager  . Complication of anesthesia     difficulty waking up  . Shortness of breath   . Lung mass     R- ADENOCARCINOMA  . Sleep apnea     no longer using CPAP  . Pleural effusion 08/30/11  . 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 Anxiety   . Depression   . History of radiation therapy 09/29/11-11/04/2011    right lung 2700cGy 15 sessions   Past Surgical History  Procedure Date  . Tubal ligation 2002  . Thoracentesis 09/02/11, 09/09/11    right-side pleural effusion    reports that she quit smoking about 11 months ago. Her smoking use included Cigarettes. She has a 7.5 pack-year smoking history. She  does not have any smokeless tobacco history on file. She reports that she does not drink alcohol or use illicit drugs. family history includes Alcohol abuse in her father; Hypertension in her maternal aunt, maternal grandfather, maternal grandmother, maternal uncle, and mother; and Sarcoidosis in her sister. Allergies  Allergen Reactions  . Meloxicam     Possible GI bleed      Review of Systems  Constitutional: Negative for fever and chills.  Respiratory: Positive for cough. Negative for shortness of breath and wheezing.   Cardiovascular: Positive for chest pain.  Gastrointestinal: Negative for abdominal pain.  Genitourinary: Negative for dysuria.  Musculoskeletal: Negative for back pain.  Neurological: Negative for dizziness.       Objective:   Physical Exam  Constitutional: She appears well-developed and well-nourished.  Neck: Neck supple. No thyromegaly present.  Cardiovascular: Normal rate and regular rhythm.   Pulmonary/Chest: Effort normal.       Somewhat diminished breath sounds right lung zone but overall greatly improved compared to last time I saw her. No wheezes. No rales.  Musculoskeletal: She exhibits no edema.          Assessment & Plan:  Right-sided chest pain. This correlates with her side of right lung collapse. She's not had any acute or new symptoms. This has gone for several months and suspect related to her lung cancer/ lung collapse as opposed any new diagnoses. Previous scans have  not shown evidence for pulmonary emboli. We have agreed to refill her oxycodone. We explained that this type of controlled medicine should only be obtained through one office and as much as possible through one provider. She understands. Percocet 5/325 mg one to 2 every 6 hours as needed for pain #60

## 2011-12-10 NOTE — Telephone Encounter (Signed)
Pt call stating she is having chest pains call transferred to CAN. Pt disconnected from CAN. Pt call back stating just make me an appointment I know whats wrong with me. Pt is sch for today 1145am

## 2011-12-15 ENCOUNTER — Other Ambulatory Visit (HOSPITAL_BASED_OUTPATIENT_CLINIC_OR_DEPARTMENT_OTHER): Payer: 59 | Admitting: Lab

## 2011-12-15 ENCOUNTER — Ambulatory Visit (HOSPITAL_BASED_OUTPATIENT_CLINIC_OR_DEPARTMENT_OTHER): Payer: 59 | Admitting: Internal Medicine

## 2011-12-15 VITALS — BP 125/82 | HR 106 | Temp 98.3°F | Resp 20 | Ht 66.0 in | Wt 222.8 lb

## 2011-12-15 DIAGNOSIS — C3490 Malignant neoplasm of unspecified part of unspecified bronchus or lung: Secondary | ICD-10-CM

## 2011-12-15 DIAGNOSIS — C7952 Secondary malignant neoplasm of bone marrow: Secondary | ICD-10-CM

## 2011-12-15 DIAGNOSIS — C3400 Malignant neoplasm of unspecified main bronchus: Secondary | ICD-10-CM

## 2011-12-15 LAB — CBC WITH DIFFERENTIAL/PLATELET
Eosinophils Absolute: 0.1 10*3/uL (ref 0.0–0.5)
HGB: 9.5 g/dL — ABNORMAL LOW (ref 11.6–15.9)
LYMPH%: 20.6 % (ref 14.0–49.7)
MONO#: 0.4 10*3/uL (ref 0.1–0.9)
NEUT#: 3.2 10*3/uL (ref 1.5–6.5)
Platelets: 383 10*3/uL (ref 145–400)
RBC: 3.26 10*6/uL — ABNORMAL LOW (ref 3.70–5.45)
WBC: 4.8 10*3/uL (ref 3.9–10.3)

## 2011-12-15 LAB — COMPREHENSIVE METABOLIC PANEL (CC13)
Albumin: 3.1 g/dL — ABNORMAL LOW (ref 3.5–5.0)
CO2: 26 mEq/L (ref 22–29)
Glucose: 80 mg/dl (ref 70–99)
Potassium: 3.4 mEq/L — ABNORMAL LOW (ref 3.5–5.1)
Sodium: 140 mEq/L (ref 136–145)
Total Bilirubin: 0.9 mg/dL (ref 0.20–1.20)
Total Protein: 7.1 g/dL (ref 6.4–8.3)

## 2011-12-15 NOTE — Patient Instructions (Signed)
Continue treatment with [name] at the current dose. Followup in one month with repeat CT scan.

## 2011-12-15 NOTE — Progress Notes (Signed)
[facility] Telephone:([phone]) [phone]   Fax:(336) [phone]  OFFICE PROGRESS NOTE  [name], MD [address] [place] [address]  DIAGNOSIS: Metastatic non-small cell lung cancer, adenocarcinoma with positive EGFR mutation in exon 19 diagnosed in June of 2013   PRIOR THERAPY: palliative radiotherapy to the right lung under the care of Dr. Dayton Scrape expected to be completed on 10/29/2011.    CURRENT THERAPY: [name] 150 mg by mouth daily started 10/11/2011.  INTERVAL HISTORY: Beth [name] 51 y.o. female returns to the clinic today for routine monthly followup visit. The patient is feeling fine today except for occasional shortness of breath and right-sided chest pain. She is currently on Percocet as prescribed by Dr. Caryl Never for pain management. She denied having any significant weight loss or night sweats. She is tolerating her treatment was [name] fairly well with no significant adverse effects except for grade 1 skin rash and occasional diarrhea.   MEDICAL HISTORY: Past Medical History  Diagnosis Date  . Asthma   . GERD (gastroesophageal reflux disease)   . Hypertension   . Heart murmur   . Fibromyalgia   . Arthritis of knee, degenerative   . Endocarditis     as teenager  . Complication of anesthesia     difficulty waking up  . Shortness of breath   . Lung mass     R- ADENOCARCINOMA  . Sleep apnea     no longer using CPAP  . Pleural effusion 08/30/11  . 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 Anxiety   . Depression   . History of radiation therapy 09/29/11-11/04/2011    right lung 2700cGy 15 sessions    ALLERGIES:  is allergic to meloxicam.  MEDICATIONS:  Current Outpatient Prescriptions  Medication Sig Dispense Refill  . cyclobenzaprine (FLEXERIL) 5 MG tablet TAKE 1 TABLET BY MOUTH ONCE DAILY AS NEEDED FOR MUSCLE SPASMS  30 tablet  2  . diphenhydrAMINE (BENADRYL) 25 mg capsule One to two tabs every 6 hours prn allergies   120 capsule  3  . fish oil-omega-3 fatty acids 1000 MG capsule Take 2 g by mouth daily.      . Multiple Vitamins-Minerals (MULTIVITAMIN WITH MINERALS) tablet Take 1 tablet by mouth daily.      Marland Kitchen oxyCODONE-acetaminophen (PERCOCET/ROXICET) 5-325 MG per tablet 1-2 tablets every 6 hours prn pain  60 tablet  0  . potassium chloride SA (K-DUR,KLOR-CON) 20 MEQ tablet Take 1 tablet (20 mEq total) by mouth daily.  7 tablet  0  . [name] 150 MG tablet Take 150 mg by mouth daily.       . Vitamin D, Ergocalciferol, (DRISDOL) 50000 UNITS CAPS Take 50,000 Units by mouth every 7 (seven) days. On wednesdays      . ABILIFY 10 MG tablet TAKE 1 TABLET BY MOUTH EVERY MORNING  30 tablet  11  . chlorpheniramine-HYDROcodone (TUSSIONEX) 10-8 MG/5ML LQCR Take 5 mLs by mouth every 12 (twelve) hours as needed.  140 mL  0  . clindamycin (CLEOCIN-T) 1 % external solution Apply topically 2 (two) times daily.  240 mL  0  . Fe Fum-FePoly-Vit C-Vit B3 (INTEGRA) 62.5-62.5-40-3 MG CAPS Take 1 tablet by mouth daily.      Marland Kitchen lamoTRIgine (LAMICTAL) 25 MG tablet TAKE 2 TABLETS BY MOUTH AT BEDTIME  60 tablet  11  . prochlorperazine (COMPAZINE) 10 MG tablet Take 1 tablet (10 mg total) by mouth every 6 (six) hours as needed.  30 tablet  1  . Venlafaxine HCl 150 MG TB24 Take 150 mg by mouth daily.        SURGICAL HISTORY:  Past Surgical History  Procedure Date  . Tubal ligation 2002  . Thoracentesis 09/02/11, 09/09/11    right-side pleural effusion    REVIEW OF SYSTEMS:  A comprehensive review of systems was negative except for: Constitutional: positive for fatigue Respiratory: positive for dyspnea on exertion and pleurisy/chest pain   PHYSICAL EXAMINATION: General appearance: alert, cooperative and no distress Head: Normocephalic, without obvious abnormality, atraumatic Neck: no adenopathy Lymph nodes: Cervical, supraclavicular, and axillary nodes normal. Resp: clear to auscultation bilaterally Cardio: regular rate and rhythm,  S1, S2 normal, no murmur, click, rub or gallop GI: soft, non-tender; bowel sounds normal; no masses,  no organomegaly Extremities: extremities normal, atraumatic, no cyanosis or edema Neurologic: Alert and oriented X 3, normal strength and tone. Normal symmetric reflexes. Normal coordination and gait  ECOG PERFORMANCE STATUS: 1 - Symptomatic but completely ambulatory  Blood pressure 125/82, pulse 106, temperature 98.3 F (36.8 C), temperature source Oral, resp. rate 20, height 5\' 6"  (1.676 m), weight 222 lb 12.8 oz (101.061 kg).  LABORATORY DATA: Lab Results  Component Value Date   WBC 4.8 12/15/2011   HGB 9.5* 12/15/2011   HCT 28.8* 12/15/2011   MCV 88.3 12/15/2011   PLT 383 12/15/2011      Chemistry      Component Value Date/Time   NA 140 11/18/2011 1405   NA 137 10/26/2011 1616   K 3.4* 11/18/2011 1405   K 3.2* 10/26/2011 1616   CL 104 11/18/2011 1405   CL 97 10/26/2011 1616   CO2 27 11/18/2011 1405   CO2 31 10/26/2011 1616   BUN 7.0 11/18/2011 1405   BUN 9 10/26/2011 1616   CREATININE 0.7 11/18/2011 1405   CREATININE 0.61 10/26/2011 1616      Component Value Date/Time   CALCIUM 9.3 10/26/2011 1616   ALKPHOS 120 11/18/2011 1405   ALKPHOS 124* 10/26/2011 1616   AST 15 11/18/2011 1405   AST 32 10/26/2011 1616   ALT 11 11/18/2011 1405   ALT 19 10/26/2011 1616   BILITOT 0.80 11/18/2011 1405   BILITOT 1.0 10/26/2011 1616       RADIOGRAPHIC STUDIES: No results found.  ASSESSMENT: This is a very pleasant 51 years old African American female with metastatic non-small cell lung cancer, adenocarcinoma with positive EGFR mutation.  PLAN: She is currently on treatment with [name] and tolerating it fairly well. I advised the patient to continue her current treatment with [name] 150 mg by mouth daily. I would see her back for followup visit in one month with repeat CT scan of the chest, abdomen and pelvis for restaging of her disease. I advised the patient to see her dentist for dental clearance before  considering her for treatment with Xgeva for her bone disease. She was advised to call me immediately if she has any concerning symptoms in the interval.  All questions were answered. The patient knows to call the clinic with any problems, questions or concerns. We can certainly see the patient much sooner if necessary.  I spent 15 minutes counseling the patient face to face. The total time spent in the appointment was 25 minutes.

## 2011-12-16 ENCOUNTER — Telehealth: Payer: Self-pay | Admitting: *Deleted

## 2011-12-16 ENCOUNTER — Telehealth: Payer: Self-pay | Admitting: Medical Oncology
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
## 2011-12-16 NOTE — Telephone Encounter (Signed)
Does she need to take contrast for Ct . I told that is how it was ordered. I transferred her call to CT

## 2011-12-17 ENCOUNTER — Other Ambulatory Visit: Payer: Self-pay | Admitting: Family Medicine

## 2011-12-17 MED ORDER — OXYCODONE-ACETAMINOPHEN 5-325 MG PO TABS: ORAL_TABLET | ORAL | Status: DC

## 2011-12-17 NOTE — Telephone Encounter (Signed)
Pt informed and voiced understanding
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
## 2011-12-17 NOTE — Telephone Encounter (Signed)
Refill this one time. She needs to know that if this happens in the future we will be unable to refill early

## 2011-12-20 NOTE — Telephone Encounter (Signed)
Cough syrup last filled 10-14-11, #140 ml with 0 refills

## 2011-12-20 NOTE — Telephone Encounter (Signed)
May refill once but she should not be combining with any other oral pain meds.

## 2011-12-21 NOTE — Telephone Encounter (Signed)
Pt informed, "the oxycontin makes me itch, so i don't take it" FYI

## 2012-01-11 ENCOUNTER — Other Ambulatory Visit (HOSPITAL_BASED_OUTPATIENT_CLINIC_OR_DEPARTMENT_OTHER): Payer: 59 | Admitting: Lab

## 2012-01-11 ENCOUNTER — Ambulatory Visit (HOSPITAL_COMMUNITY)
Admission: RE | Admit: 2012-01-11 | Discharge: 2012-01-11 | Disposition: A | Discharge status: Home / Self Care | Payer: 59 | Source: Ambulatory Visit | Attending: Internal Medicine | Admitting: Internal Medicine

## 2012-01-11 ENCOUNTER — Ambulatory Visit (HOSPITAL_COMMUNITY): Payer: 59

## 2012-01-11 DIAGNOSIS — C3490 Malignant neoplasm of unspecified part of unspecified bronchus or lung: Secondary | ICD-10-CM | POA: Insufficient documentation

## 2012-01-11 DIAGNOSIS — C7951 Secondary malignant neoplasm of bone: Secondary | ICD-10-CM | POA: Insufficient documentation

## 2012-01-11 DIAGNOSIS — N852 Hypertrophy of uterus: Secondary | ICD-10-CM | POA: Insufficient documentation

## 2012-01-11 LAB — CBC WITH DIFFERENTIAL/PLATELET
Eosinophils Absolute: 0.1 10*3/uL (ref 0.0–0.5)
MONO#: 0.3 10*3/uL (ref 0.1–0.9)
NEUT#: 2.5 10*3/uL (ref 1.5–6.5)
RBC: 3.37 10*6/uL — ABNORMAL LOW (ref 3.70–5.45)
RDW: 20 % — ABNORMAL HIGH (ref 11.2–14.5)
WBC: 3.9 10*3/uL (ref 3.9–10.3)
lymph#: 1 10*3/uL (ref 0.9–3.3)

## 2012-01-11 LAB — COMPREHENSIVE METABOLIC PANEL (CC13)
Albumin: 3.2 g/dL — ABNORMAL LOW (ref 3.5–5.0)
Alkaline Phosphatase: 79 U/L (ref 40–150)
CO2: 24 mEq/L (ref 22–29)
Glucose: 91 mg/dl (ref 70–99)
Potassium: 3.8 mEq/L (ref 3.5–5.1)
Sodium: 139 mEq/L (ref 136–145)
Total Protein: 7.2 g/dL (ref 6.4–8.3)

## 2012-01-11 MED ORDER — IOHEXOL 300 MG/ML  SOLN
100.0000 mL | Freq: Once | INTRAMUSCULAR | Status: AC | PRN
  Administered 2012-01-11: 100 mL via INTRAVENOUS

## 2012-01-13 ENCOUNTER — Telehealth: Payer: Self-pay | Admitting: Internal Medicine

## 2012-01-13 ENCOUNTER — Ambulatory Visit (HOSPITAL_BASED_OUTPATIENT_CLINIC_OR_DEPARTMENT_OTHER): Payer: 59

## 2012-01-13 ENCOUNTER — Telehealth: Payer: Self-pay | Admitting: Medical Oncology

## 2012-01-13 ENCOUNTER — Ambulatory Visit (HOSPITAL_BASED_OUTPATIENT_CLINIC_OR_DEPARTMENT_OTHER): Payer: 59 | Admitting: Internal Medicine

## 2012-01-13 VITALS — BP 144/87 | HR 112 | Temp 97.5°F | Resp 20 | Ht 66.0 in | Wt 221.1 lb

## 2012-01-13 DIAGNOSIS — C7951 Secondary malignant neoplasm of bone: Secondary | ICD-10-CM

## 2012-01-13 DIAGNOSIS — C3430 Malignant neoplasm of lower lobe, unspecified bronchus or lung: Secondary | ICD-10-CM

## 2012-01-13 DIAGNOSIS — C3490 Malignant neoplasm of unspecified part of unspecified bronchus or lung: Secondary | ICD-10-CM

## 2012-01-13 DIAGNOSIS — C7952 Secondary malignant neoplasm of bone marrow: Secondary | ICD-10-CM

## 2012-01-13 DIAGNOSIS — R0602 Shortness of breath: Secondary | ICD-10-CM

## 2012-01-13 MED ORDER — DENOSUMAB 120 MG/1.7ML ~~LOC~~ SOLN
120.0000 mg | Freq: Once | SUBCUTANEOUS | Status: AC
  Administered 2012-01-13: 120 mg via SUBCUTANEOUS
  Filled 2012-01-13: qty 1.7

## 2012-01-13 NOTE — Patient Instructions (Signed)
Your scan showed partial response of her disease. Continue treatment with [name]. We will start Rivka Barbara today for her bone disease

## 2012-01-13 NOTE — Progress Notes (Signed)
[facility] Telephone:(336) [phone]   Fax:(336) [phone]  OFFICE PROGRESS NOTE  [name], MD [address] [place] [address]  DIAGNOSIS: Metastatic non-small cell lung cancer, adenocarcinoma with positive EGFR mutation in exon 19 diagnosed in June of 2013   PRIOR THERAPY: palliative radiotherapy to the right lung under the care of Dr. Dayton Scrape expected to be completed on 10/29/2011.   CURRENT THERAPY: [name] 150 mg by mouth daily started 10/11/2011.  INTERVAL HISTORY: Beth [name] 51 y.o. female returns to the clinic today for followup visit. The patient is feeling fine today with no specific complaints except for occasional dyspnea with exertion. She denied having any significant chest pain, cough or hemoptysis. She denied having any significant weight loss or night sweats. She is tolerating her treatment was [name] fairly well except for grade 1 skin rash and occasional diarrhea. The patient was seen by her dentist Dr. Oleta Mouse and she had a dental clearance to proceed with treatment with Xgeva.  The patient had repeat CT scan of the chest, abdomen and pelvis performed recently and she is here today for evaluation and discussion of her scan results.  MEDICAL HISTORY: Past Medical History  Diagnosis Date  . Asthma   . GERD (gastroesophageal reflux disease)   . Hypertension   . Heart murmur   . Fibromyalgia   . Arthritis of knee, degenerative   . Endocarditis     as teenager  . Complication of anesthesia     difficulty waking up  . Shortness of breath   . Lung mass     R- ADENOCARCINOMA  . Sleep apnea     no longer using CPAP  . Pleural effusion 08/30/11  . 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 Anxiety   . Depression   . History of radiation therapy 09/29/11-11/04/2011    right lung 2700cGy 15 sessions    ALLERGIES:  is allergic to meloxicam.  MEDICATIONS:  Current Outpatient Prescriptions  Medication Sig  Dispense Refill  . ABILIFY 10 MG tablet TAKE 1 TABLET BY MOUTH EVERY MORNING  30 tablet  11  . chlorpheniramine-HYDROcodone (TUSSIONEX) 10-8 MG/5ML LQCR TAKE (1 TEASPOONFUL) BY MOUTH EVERY 12 HOURS AS NEEDED  140 mL  0  . clindamycin (CLEOCIN-T) 1 % external solution Apply topically 2 (two) times daily.  240 mL  0  . cyclobenzaprine (FLEXERIL) 5 MG tablet TAKE 1 TABLET BY MOUTH ONCE DAILY AS NEEDED FOR MUSCLE SPASMS  30 tablet  2  . diphenhydrAMINE (BENADRYL) 25 mg capsule One to two tabs every 6 hours prn allergies  120 capsule  3  . Fe Fum-FePoly-Vit C-Vit B3 (INTEGRA) 62.5-62.5-40-3 MG CAPS Take 1 tablet by mouth daily.      . fish oil-omega-3 fatty acids 1000 MG capsule Take 2 g by mouth daily.      Marland Kitchen lamoTRIgine (LAMICTAL) 25 MG tablet TAKE 2 TABLETS BY MOUTH AT BEDTIME  60 tablet  11  . Multiple Vitamins-Minerals (MULTIVITAMIN WITH MINERALS) tablet Take 1 tablet by mouth daily.      . potassium chloride SA (K-DUR,KLOR-CON) 20 MEQ tablet Take 1 tablet (20 mEq total) by mouth daily.  7 tablet  0  . [name] 150 MG tablet Take 150 mg by mouth daily.       . Venlafaxine HCl 150 MG TB24 Take 150 mg by mouth daily.      . Vitamin D, Ergocalciferol, (DRISDOL) 50000 UNITS CAPS Take  50,000 Units by mouth every 7 (seven) days. On wednesdays      . oxyCODONE-acetaminophen (PERCOCET/ROXICET) 5-325 MG per tablet 1-2 tablets every 6 hours prn pain  60 tablet  0  . prochlorperazine (COMPAZINE) 10 MG tablet Take 1 tablet (10 mg total) by mouth every 6 (six) hours as needed.  30 tablet  1    SURGICAL HISTORY:  Past Surgical History  Procedure Date  . Tubal ligation 2002  . Thoracentesis 09/02/11, 09/09/11    right-side pleural effusion    REVIEW OF SYSTEMS:  A comprehensive review of systems was negative except for: Respiratory: positive for dyspnea on exertion   PHYSICAL EXAMINATION: General appearance: alert, cooperative and no distress Head: Normocephalic, without obvious abnormality,  atraumatic Neck: no adenopathy Lymph nodes: Cervical, supraclavicular, and axillary nodes normal. Resp: clear to auscultation bilaterally Cardio: regular rate and rhythm, S1, S2 normal, no murmur, click, rub or gallop GI: soft, non-tender; bowel sounds normal; no masses,  no organomegaly Extremities: extremities normal, atraumatic, no cyanosis or edema Neurologic: Alert and oriented X 3, normal strength and tone. Normal symmetric reflexes. Normal coordination and gait  ECOG PERFORMANCE STATUS: 1 - Symptomatic but completely ambulatory  Blood pressure 144/87, pulse 112, temperature 97.5 F (36.4 C), temperature source Oral, resp. rate 20, height 5\' 6"  (1.676 m), weight 221 lb 1.6 oz (100.29 kg).  LABORATORY DATA: Lab Results  Component Value Date   WBC 3.9 01/11/2012   HGB 10.1* 01/11/2012   HCT 30.9* 01/11/2012   MCV 91.5 01/11/2012   PLT 312 01/11/2012      Chemistry      Component Value Date/Time   NA 139 01/11/2012 0825   NA 137 10/26/2011 1616   K 3.8 01/11/2012 0825   K 3.2* 10/26/2011 1616   CL 106 01/11/2012 0825   CL 97 10/26/2011 1616   CO2 24 01/11/2012 0825   CO2 31 10/26/2011 1616   BUN 9.0 01/11/2012 0825   BUN 9 10/26/2011 1616   CREATININE 0.7 01/11/2012 0825   CREATININE 0.61 10/26/2011 1616      Component Value Date/Time   CALCIUM 9.4 01/11/2012 0825   CALCIUM 9.3 10/26/2011 1616   ALKPHOS 79 01/11/2012 0825   ALKPHOS 124* 10/26/2011 1616   AST 12 01/11/2012 0825   AST 32 10/26/2011 1616   ALT 8 01/11/2012 0825   ALT 19 10/26/2011 1616   BILITOT 0.70 01/11/2012 0825   BILITOT 1.0 10/26/2011 1616       RADIOGRAPHIC STUDIES: Ct Chest W Contrast  01/11/2012  *RADIOLOGY REPORT*  Clinical Data:  Metastatic lung carcinoma.  Currently undergoing oral chemotherapy.  Shortness of breath and cough.  Previous radiation therapy.  CT CHEST, ABDOMEN AND PELVIS WITH CONTRAST  Technique:  Multidetector CT imaging of the chest, abdomen and pelvis was performed following the  standard protocol during bolus administration of intravenous contrast.  Contrast: OMNIPAQUE IOHEXOL 300 MG/ML  SOLN  Comparison:  Chest CT on 10/10/2011 and PET CT on 09/20/2011  CT CHEST  Findings:  A moderate size malignant right pleural effusion shows no significant change in size. The right mainstem bronchus is now patent, and there is now some aeration of the atelectatic right lung.  A solid mass like opacity is seen in the right infrahilar region, which measures approximately 3.0 x 5.9 cm and this probably mildly decreased size compared to previous study when a similar appearing density measure approximately 4.4 x 6.0 cm.  No suspicious pulmonary nodules or masses are  seen in the left lung.  Mild patchy ground-glass air opacity in the left lung shows improvement since previous study.  There is no evidence of left- sided pleural effusion.  Decreased mediastinal lymphadenopathy is seen in the right paratracheal, precarinal, subcarinal, and AP window regions.  No new or increased lymphadenopathy is identified.  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  There is no evidence of dilated bowel loops. Diffuse sclerotic bone metastases show no significant change in overall appearance compared to previous study.  IMPRESSION:  1.  Stable moderate malignant right pleural effusion. 2.  Improved aeration of right lung, with probable mild decrease in size of right infrahilar mass. 3.  Decreased mediastinal lymphadenopathy. 4.  Stable diffuse sclerotic bone metastases. 5.  No new or progressive disease within the thorax.  CT ABDOMEN AND PELVIS  Findings:  No liver masses are identified and there is no evidence of biliary ductal dilatation.  The gallbladder, pancreas, spleen, adrenal glands, and kidneys are normal appearance except for ptosis of the right kidney.  No evidence of renal mass or hydronephrosis.  Enlarged uterus is again demonstrated likely due to fibroids.  No other soft  tissue masses or lymphadenopathy identified within the abdomen or pelvis.  No evidence of inflammatory process or ascites. No evidence of bowel wall thickening or dilatation.  Diffuse sclerotic bone metastases show no significant change in appearance.  IMPRESSION:  1.  No evidence of soft tissue metastatic disease or other acute findings within the abdomen or pelvis. 2.  Stable diffuse sclerotic bone metastases. 3.  Stable enlarged uterus likely secondary to fibroids.   Original Report Authenticated By: Danae Orleans, M.D.    Ct Abdomen Pelvis W Contrast  01/11/2012  *RADIOLOGY REPORT*  Clinical Data:  Metastatic lung carcinoma.  Currently undergoing oral chemotherapy.  Shortness of breath and cough.  Previous radiation therapy.  CT CHEST, ABDOMEN AND PELVIS WITH CONTRAST  Technique:  Multidetector CT imaging of the chest, abdomen and pelvis was performed following the standard protocol during bolus administration of intravenous contrast.  Contrast: OMNIPAQUE IOHEXOL 300 MG/ML  SOLN  Comparison:  Chest CT on 10/10/2011 and PET CT on 09/20/2011  CT CHEST  Findings:  A moderate size malignant right pleural effusion shows no significant change in size. The right mainstem bronchus is now patent, and there is now some aeration of the atelectatic right lung.  A solid mass like opacity is seen in the right infrahilar region, which measures approximately 3.0 x 5.9 cm and this probably mildly decreased size compared to previous study when a similar appearing density measure approximately 4.4 x 6.0 cm.  No suspicious pulmonary nodules or masses are seen in the left lung.  Mild patchy ground-glass air opacity in the left lung shows improvement since previous study.  There is no evidence of left- sided pleural effusion.  Decreased mediastinal lymphadenopathy is seen in the right paratracheal, precarinal, subcarinal, and AP window regions.  No new or increased lymphadenopathy is identified.  A right-sided Morgagni  hernia is again seen containing transverse colon.  There is no evidence of dilated bowel loops. Diffuse sclerotic bone metastases show no significant change in overall appearance compared to previous study.  IMPRESSION:  1.  Stable moderate malignant right pleural effusion. 2.  Improved aeration of right lung, with probable mild decrease in size of right infrahilar mass. 3.  Decreased mediastinal lymphadenopathy. 4.  Stable diffuse sclerotic bone metastases. 5.  No new or progressive disease within the  thorax.  CT ABDOMEN AND PELVIS  Findings:  No liver masses are identified and there is no evidence of biliary ductal dilatation.  The gallbladder, pancreas, spleen, adrenal glands, and kidneys are normal appearance except for ptosis of the right kidney.  No evidence of renal mass or hydronephrosis.  Enlarged uterus is again demonstrated likely due to fibroids.  No other soft tissue masses or lymphadenopathy identified within the abdomen or pelvis.  No evidence of inflammatory process or ascites. No evidence of bowel wall thickening or dilatation.  Diffuse sclerotic bone metastases show no significant change in appearance.  IMPRESSION:  1.  No evidence of soft tissue metastatic disease or other acute findings within the abdomen or pelvis. 2.  Stable diffuse sclerotic bone metastases. 3.  Stable enlarged uterus likely secondary to fibroids.   Original Report Authenticated By: Danae Orleans, M.D.     ASSESSMENT: This is a very pleasant 51 years old African American female with metastatic non-small cell lung cancer, adenocarcinoma with positive EGFR mutation, currently on treatment with [name] 150 mg by mouth daily for the last 3 months. The patient is tolerating her treatment fairly well and she has evidence for partial response of her disease.  PLAN: I discussed the scan results with the patient today. I recommended for her to continue on treatment with [name] 150 mg by mouth daily. For her bone disease I will  start the patient on treatment with Xgeva 120 mg subcutaneously every 4 weeks. The patient had dental clearance from her dentist and he personally spoke to me clearing her to proceed with treatment. The patient was reminded of the significant adverse effect of Xgeva including osteonecrosis of the jaw and she was advised to maintain a good dental hygiene. The patient would come back for followup visit in one month's for reevaluation and repeat blood work. She was advised to call immediately if she has any concerning symptoms in the interval.  All questions were answered. The patient knows to call the clinic with any problems, questions or concerns. We can certainly see the patient much sooner if necessary.  I spent 20 minutes counseling the patient face to face. The total time spent in the appointment was 30 minutes.

## 2012-01-13 NOTE — Telephone Encounter (Signed)
Gave pt appt for November 2013 lab,ML and Injection

## 2012-01-13 NOTE — Telephone Encounter (Signed)
Called to get dental clearance from Dr Doree Fudge.Dr Arbutus Ped spoke to Dr Delena Bali

## 2012-01-14 ENCOUNTER — Other Ambulatory Visit: Payer: Self-pay | Admitting: Internal Medicine

## 2012-01-17 ENCOUNTER — Other Ambulatory Visit: Payer: Self-pay | Admitting: Internal Medicine

## 2012-01-17 ENCOUNTER — Telehealth: Payer: Self-pay | Admitting: Medical Oncology

## 2012-01-17 DIAGNOSIS — C3490 Malignant neoplasm of unspecified part of unspecified bronchus or lung: Secondary | ICD-10-CM

## 2012-01-17 MED ORDER — ERLOTINIB HCL 150 MG PO TABS: 150.0000 mg | ORAL_TABLET | Freq: Every day | ORAL | Status: AC

## 2012-01-17 NOTE — Telephone Encounter (Signed)
Pt insurance requires she use [facility] for tarceva,. New rx faxed .

## 2012-01-18 ENCOUNTER — Other Ambulatory Visit: Payer: Self-pay | Admitting: *Deleted

## 2012-01-20 ENCOUNTER — Ambulatory Visit (INDEPENDENT_AMBULATORY_CARE_PROVIDER_SITE_OTHER): Payer: 59 | Admitting: Family Medicine

## 2012-01-20 ENCOUNTER — Encounter: Payer: Self-pay | Admitting: Family Medicine

## 2012-01-20 VITALS — BP 128/78 | Temp 98.7°F | Wt 216.0 lb

## 2012-01-20 DIAGNOSIS — Z8659 Personal history of other mental and behavioral disorders: Secondary | ICD-10-CM

## 2012-01-20 DIAGNOSIS — K219 Gastro-esophageal reflux disease without esophagitis: Secondary | ICD-10-CM

## 2012-01-20 DIAGNOSIS — R3915 Urgency of urination: Secondary | ICD-10-CM

## 2012-01-20 LAB — POCT URINALYSIS DIPSTICK
Blood, UA: NEGATIVE
Glucose, UA: NEGATIVE
Nitrite, UA: NEGATIVE
Urobilinogen, UA: 0.2
pH, UA: 6

## 2012-01-20 MED ORDER — LAMOTRIGINE 25 MG PO TABS: 25.0000 mg | ORAL_TABLET | Freq: Two times a day (BID) | ORAL | Status: DC

## 2012-01-20 MED ORDER — ESOMEPRAZOLE MAGNESIUM 40 MG PO CPDR: 40.0000 mg | DELAYED_RELEASE_CAPSULE | Freq: Every day | ORAL | Status: DC

## 2012-01-20 MED ORDER — VITAMIN D (ERGOCALCIFEROL) 1.25 MG (50000 UNIT) PO CAPS: 50000.0000 [IU] | ORAL_CAPSULE | ORAL | Status: DC

## 2012-01-20 MED ORDER — OXYCODONE-ACETAMINOPHEN 5-325 MG PO TABS: ORAL_TABLET | ORAL | Status: DC

## 2012-01-20 MED ORDER — ARIPIPRAZOLE 10 MG PO TABS: 10.0000 mg | ORAL_TABLET | Freq: Every day | ORAL | Status: DC

## 2012-01-20 NOTE — Progress Notes (Signed)
Subjective:    Patient ID: Beth [name], female    DOB: 12-Apr-1960, 51 y.o.   MRN: [id number]  HPI  Patient has history of stage IV adenocarcinoma of the lung. She is seen today for the following issues  Urine urgency over the past several days. Occasional burning with urination but mostly urgency. Recent blood work including chemistries normal. No history of diabetes. She does not take any diuretics. We recently discontinued HCTZ.  Occasional dysphagia to liquids and solids. Prior history of GERD.  Progressive symptoms over one month. Currently not taking any antacids. She is on [name] and is trying to avoid proton pump inhibitors. Previously on Nexium. She reportedly has discussed this with her oncologist about possibly needing to go back on Nexium. Weight stable. Good appetite.  Patient requesting refills of Abilify and Lamictal. Mood has been stable.  Past Medical History  Diagnosis Date  . Asthma   . GERD (gastroesophageal reflux disease)   . Hypertension   . Heart murmur   . Fibromyalgia   . Arthritis of knee, degenerative   . Endocarditis     as teenager  . Complication of anesthesia     difficulty waking up  . Shortness of breath   . Lung mass     R- ADENOCARCINOMA  . Sleep apnea     no longer using CPAP  . Pleural effusion 08/30/11  . 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 Anxiety   . Depression   . History of radiation therapy 09/29/11-11/04/2011    right lung 2700cGy 15 sessions   Past Surgical History  Procedure Date  . Tubal ligation 2002  . Thoracentesis 09/02/11, 09/09/11    right-side pleural effusion    reports that she quit smoking about 12 months ago. Her smoking use included Cigarettes. She has a 7.5 pack-year smoking history. She does not have any smokeless tobacco history on file. She reports that she does not drink alcohol or use illicit drugs. family history includes Alcohol abuse in her father; Hypertension in her maternal aunt, maternal  grandfather, maternal grandmother, maternal uncle, and mother; and Sarcoidosis in her sister. Allergies  Allergen Reactions  . Meloxicam     Possible GI bleed      Review of Systems  Constitutional: Negative for fever, chills, appetite change and unexpected weight change.  HENT: Positive for trouble swallowing.   Respiratory: Negative for cough and wheezing.   Cardiovascular: Negative for chest pain and leg swelling.  Gastrointestinal: Negative for nausea, vomiting, abdominal pain, diarrhea and constipation.  Genitourinary: Positive for dysuria, urgency and frequency.  Neurological: Negative for dizziness.  Hematological: Negative for adenopathy.       Objective:   Physical Exam  Constitutional: She appears well-developed and well-nourished.  HENT:  Mouth/Throat: Oropharynx is clear and moist.  Neck: Neck supple. No thyromegaly present.  Cardiovascular: Normal rate and regular rhythm.   Pulmonary/Chest: Effort normal and breath sounds normal. No respiratory distress. She has no wheezes. She has no rales.  Musculoskeletal: She exhibits no edema.  Lymphadenopathy:    She has no cervical adenopathy.          Assessment & Plan:  #1 dysuria/urgency. Urine dipstick unremarkable. No evidence for infection. Recent glucose normal. No diuretic use. Recommended observation at this time #2 history of GERD with some recent symptoms of mild dysphagia. Question developing esophageal stricture vs other. Get back on Nexium. She knows not to take this within 2 hours of [name]. Consider GI referral  if not improving over the next few weeks  #3 history of vitamin D deficiency. Refills given  #4 metastatic stage IV adenocarcinoma lung. Refill given oxycodone 5 mg #60

## 2012-01-20 NOTE — Patient Instructions (Addendum)
Call in 2-3 weeks if swallowing difficulties not improving.   Be sure to take Nexium at least 2 hours after [name].

## 2012-01-27 ENCOUNTER — Other Ambulatory Visit: Payer: Self-pay | Admitting: Family Medicine

## 2012-01-27 MED ORDER — OXYCODONE HCL 5 MG PO CAPS: ORAL_CAPSULE | ORAL | Status: DC

## 2012-01-27 NOTE — Telephone Encounter (Signed)
Pt would like refill on oxycodone. Pt would like to have script without the Tylenol in it, due to it bothers her stomach.

## 2012-01-27 NOTE — Telephone Encounter (Signed)
Oxycodone 5-325 last filled 01-20-12, #60 with 0 refills, 1-2 tabs every 6 hours prn pain

## 2012-01-27 NOTE — Telephone Encounter (Signed)
Pt informed Rx ready to pick up

## 2012-01-27 NOTE — Telephone Encounter (Signed)
We can refill plain oxycodone 5 mg #60 with no refill one every 6 hours prn pain .  D/c oxycodone 5/325 mg off list.

## 2012-02-07 ENCOUNTER — Telehealth: Payer: Self-pay | Admitting: Family Medicine

## 2012-02-07 MED ORDER — PANTOPRAZOLE SODIUM 40 MG PO TBEC: 40.0000 mg | DELAYED_RELEASE_TABLET | Freq: Every day | ORAL | Status: DC

## 2012-02-07 NOTE — Telephone Encounter (Signed)
Pt called and said that the esomeprazole (NEXIUM) 40 MG capsule is too expensive. Pt is req a cheaper alternative to be called in to [facility].

## 2012-02-07 NOTE — Telephone Encounter (Signed)
Let's try Pantoprazole 40 mg once daily and OK to refill for 6 months

## 2012-02-14 ENCOUNTER — Telehealth: Payer: Self-pay | Admitting: Internal Medicine

## 2012-02-14 ENCOUNTER — Ambulatory Visit: Payer: 59

## 2012-02-14 ENCOUNTER — Ambulatory Visit: Payer: 59 | Admitting: Physician Assistant

## 2012-02-14 ENCOUNTER — Other Ambulatory Visit: Payer: 59

## 2012-02-14 NOTE — Telephone Encounter (Signed)
pt called and lm to r/s 11/25 appt to 11/29,stated that she was having phone trble/fixed 11/26  and hopefully i could lv a vm.  appt r/s but unable to lv a vm on her phone.

## 2012-02-18 ENCOUNTER — Other Ambulatory Visit (HOSPITAL_BASED_OUTPATIENT_CLINIC_OR_DEPARTMENT_OTHER): Payer: 59 | Admitting: Lab

## 2012-02-18 ENCOUNTER — Ambulatory Visit (HOSPITAL_BASED_OUTPATIENT_CLINIC_OR_DEPARTMENT_OTHER): Payer: 59

## 2012-02-18 ENCOUNTER — Telehealth: Payer: Self-pay | Admitting: Internal Medicine

## 2012-02-18 ENCOUNTER — Ambulatory Visit (HOSPITAL_BASED_OUTPATIENT_CLINIC_OR_DEPARTMENT_OTHER): Payer: 59 | Admitting: Physician Assistant

## 2012-02-18 VITALS — BP 134/95 | HR 108 | Temp 97.8°F | Resp 20 | Ht 66.0 in | Wt 220.1 lb

## 2012-02-18 DIAGNOSIS — C3490 Malignant neoplasm of unspecified part of unspecified bronchus or lung: Secondary | ICD-10-CM

## 2012-02-18 DIAGNOSIS — C7951 Secondary malignant neoplasm of bone: Secondary | ICD-10-CM

## 2012-02-18 DIAGNOSIS — C3400 Malignant neoplasm of unspecified main bronchus: Secondary | ICD-10-CM

## 2012-02-18 DIAGNOSIS — C7952 Secondary malignant neoplasm of bone marrow: Secondary | ICD-10-CM

## 2012-02-18 DIAGNOSIS — C3430 Malignant neoplasm of lower lobe, unspecified bronchus or lung: Secondary | ICD-10-CM

## 2012-02-18 LAB — CBC WITH DIFFERENTIAL/PLATELET
Basophils Absolute: 0 10*3/uL (ref 0.0–0.1)
HCT: 35.1 % (ref 34.8–46.6)
HGB: 11.6 g/dL (ref 11.6–15.9)
MCH: 30.3 pg (ref 25.1–34.0)
MONO#: 0.5 10*3/uL (ref 0.1–0.9)
NEUT%: 67.9 % (ref 38.4–76.8)
lymph#: 1.1 10*3/uL (ref 0.9–3.3)

## 2012-02-18 LAB — COMPREHENSIVE METABOLIC PANEL (CC13)
BUN: 11 mg/dL (ref 7.0–26.0)
CO2: 29 mEq/L (ref 22–29)
Calcium: 8.7 mg/dL (ref 8.4–10.4)
Chloride: 104 mEq/L (ref 98–107)
Creatinine: 0.7 mg/dL (ref 0.6–1.1)
Glucose: 98 mg/dl (ref 70–99)

## 2012-02-18 MED ORDER — OXYCODONE HCL 5 MG PO CAPS: ORAL_CAPSULE | ORAL | Status: DC

## 2012-02-18 MED ORDER — DENOSUMAB 120 MG/1.7ML ~~LOC~~ SOLN
120.0000 mg | Freq: Once | SUBCUTANEOUS | Status: AC
  Administered 2012-02-18: 120 mg via SUBCUTANEOUS
  Filled 2012-02-18: qty 1.7

## 2012-02-18 NOTE — Telephone Encounter (Signed)
gv and printed appt schedule for pt for Dec  °

## 2012-02-18 NOTE — Patient Instructions (Addendum)
Follow instructions for antacids Continue [name] 150 mg by mouth daily Follow up in 1 month

## 2012-02-20 NOTE — Progress Notes (Addendum)
[facility] [phone]) [phone]   [phone]  OFFICE PROGRESS NOTE  [name], MD [address] [place] [address]  DIAGNOSIS: Metastatic non-small cell lung cancer, adenocarcinoma with positive EGFR mutation in exon 19 diagnosed in June of 2013   PRIOR THERAPY: palliative radiotherapy to the right lung under the care of Dr. Dayton Scrape expected to be completed on 10/29/2011.   CURRENT THERAPY:  1. [name] 150 mg by mouth daily started 10/11/2011. Status post 4 months of therapy 2. Xgeva 120 mg subcutaneously every 4 weeks  INTERVAL HISTORY: Beth [name] 51 y.o. female returns to the clinic today for followup visit. The patient is feeling fine today with no specific complaints except for occasional dyspnea with exertion. She denied having any significant chest pain, cough or hemoptysis. She denied having any significant weight loss or night sweats. She is tolerating her treatment was [name] fairly well except for grade 1 skin rash and occasional diarrhea. She reports that she has diarrhea about 3 times per week. She has occasional stomach pain with coughing. She had recently been prescribed Protonix the nose this is not ideal to take with [name]. She is wondering well she can take for her acid reflux symptoms. She requests a refill for her Percocet. She states that she prefers to pain medications from our office and she is followed by [place] on a monthly basis. The pain is primarily for her right "lung pain".   MEDICAL HISTORY: Past Medical History  Diagnosis Date  . Asthma   . GERD (gastroesophageal reflux disease)   . Hypertension   . Heart murmur   . Fibromyalgia   . Arthritis of knee, degenerative   . Endocarditis     as teenager  . Complication of anesthesia     difficulty waking up  . Shortness of breath   . Lung mass     R- ADENOCARCINOMA  . Sleep apnea     no longer using CPAP  . Pleural effusion 08/30/11  . Osseous  metastasis 09/20/11    per PET scan  . Anxiety   . Depression   . History of radiation therapy 09/29/11-11/04/2011    right lung 2700cGy 15 sessions    ALLERGIES:  is allergic to meloxicam.  MEDICATIONS:  Current Outpatient Prescriptions  Medication Sig Dispense Refill  . ARIPiprazole (ABILIFY) 10 MG tablet Take 1 tablet (10 mg total) by mouth daily.  90 tablet  1  . chlorpheniramine-HYDROcodone (TUSSIONEX) 10-8 MG/5ML LQCR TAKE (1 TEASPOONFUL) BY MOUTH EVERY 12 HOURS AS NEEDED  140 mL  0  . clindamycin (CLEOCIN T) 1 % external solution APPLY TOPICALLY TWICE A DAY  240 mL  3  . diphenhydrAMINE (BENADRYL) 25 mg capsule One to two tabs every 6 hours prn allergies  120 capsule  3  . Fe Fum-FePoly-Vit C-Vit B3 (INTEGRA) 62.5-62.5-40-3 MG CAPS Take 1 tablet by mouth daily.      . fish oil-omega-3 fatty acids 1000 MG capsule Take 2 g by mouth daily.      Marland Kitchen lamoTRIgine (LAMICTAL) 25 MG tablet Take 1 tablet (25 mg total) by mouth 2 (two) times daily.  180 tablet  1  . Multiple Vitamins-Minerals (MULTIVITAMIN WITH MINERALS) tablet Take 1 tablet by mouth daily.      Marland Kitchen oxycodone (OXY-IR) 5 MG capsule 1-2 tabs every 6 hours prn pain  60 capsule  0  . pantoprazole (PROTONIX) 40 MG tablet Take 1 tablet (40 mg total) by  mouth daily.  90 tablet  1  . prochlorperazine (COMPAZINE) 10 MG tablet Take 1 tablet (10 mg total) by mouth every 6 (six) hours as needed.  30 tablet  1  . [name] 150 MG tablet       . Venlafaxine HCl 150 MG TB24 Take 150 mg by mouth daily.      . Vitamin D, Ergocalciferol, (DRISDOL) 50000 UNITS CAPS Take 1 capsule (50,000 Units total) by mouth every 7 (seven) days. On wednesdays  12 capsule  3    SURGICAL HISTORY:  Past Surgical History  Procedure Date  . Tubal ligation 2002  . Thoracentesis 09/02/11, 09/09/11    right-side pleural effusion    REVIEW OF SYSTEMS:  Pertinent items are noted in HPI.   PHYSICAL EXAMINATION: General appearance: alert, cooperative and no  distress Head: Normocephalic, without obvious abnormality, atraumatic Neck: no adenopathy Lymph nodes: Cervical, supraclavicular, and axillary nodes normal. Resp: clear to auscultation bilaterally Cardio: regular rate and rhythm, S1, S2 normal, no murmur, click, rub or gallop GI: soft, non-tender; bowel sounds normal; no masses,  no organomegaly Extremities: extremities normal, atraumatic, no cyanosis or edema Neurologic: Alert and oriented X 3, normal strength and tone. Normal symmetric reflexes. Normal coordination and gait Skin: Acneiform eruptions scattered about the face minimal erythema, no evidence of superinfection  ECOG PERFORMANCE STATUS: 1 - Symptomatic but completely ambulatory  Blood pressure 134/95, pulse 108, temperature 97.8 F (36.6 C), temperature source Oral, resp. rate 20, height 5\' 6"  (1.676 m), weight 220 lb 1.6 oz (99.837 kg).  LABORATORY DATA: Lab Results  Component Value Date   WBC 5.0 02/18/2012   HGB 11.6 02/18/2012   HCT 35.1 02/18/2012   MCV 91.3 02/18/2012   PLT 327 02/18/2012      Chemistry      Component Value Date/Time   NA 140 02/18/2012 1118   NA 137 10/26/2011 1616   K 4.2 02/18/2012 1118   K 3.2* 10/26/2011 1616   CL 104 02/18/2012 1118   CL 97 10/26/2011 1616   CO2 29 02/18/2012 1118   CO2 31 10/26/2011 1616   BUN 11.0 02/18/2012 1118   BUN 9 10/26/2011 1616   CREATININE 0.7 02/18/2012 1118   CREATININE 0.61 10/26/2011 1616      Component Value Date/Time   CALCIUM 8.7 02/18/2012 1118   CALCIUM 9.3 10/26/2011 1616   ALKPHOS 117 02/18/2012 1118   ALKPHOS 124* 10/26/2011 1616   AST 18 02/18/2012 1118   AST 32 10/26/2011 1616   ALT 13 02/18/2012 1118   ALT 19 10/26/2011 1616   BILITOT 0.92 02/18/2012 1118   BILITOT 1.0 10/26/2011 1616       RADIOGRAPHIC STUDIES: Ct Chest W Contrast  01/11/2012  *RADIOLOGY REPORT*  Clinical Data:  Metastatic lung carcinoma.  Currently undergoing oral chemotherapy.  Shortness of breath and cough.  Previous  radiation therapy.  CT CHEST, ABDOMEN AND PELVIS WITH CONTRAST  Technique:  Multidetector CT imaging of the chest, abdomen and pelvis was performed following the standard protocol during bolus administration of intravenous contrast.  Contrast: OMNIPAQUE IOHEXOL 300 MG/ML  SOLN  Comparison:  Chest CT on 10/10/2011 and PET CT on 09/20/2011  CT CHEST  Findings:  A moderate size malignant right pleural effusion shows no significant change in size. The right mainstem bronchus is now patent, and there is now some aeration of the atelectatic right lung.  A solid mass like opacity is seen in the right infrahilar region, which measures approximately  3.0 x 5.9 cm and this probably mildly decreased size compared to previous study when a similar appearing density measure approximately 4.4 x 6.0 cm.  No suspicious pulmonary nodules or masses are seen in the left lung.  Mild patchy ground-glass air opacity in the left lung shows improvement since previous study.  There is no evidence of left- sided pleural effusion.  Decreased mediastinal lymphadenopathy is seen in the right paratracheal, precarinal, subcarinal, and AP window regions.  No new or increased lymphadenopathy is identified.  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  There is no evidence of dilated bowel loops. Diffuse sclerotic bone metastases show no significant change in overall appearance compared to previous study.  IMPRESSION:  1.  Stable moderate malignant right pleural effusion. 2.  Improved aeration of right lung, with probable mild decrease in size of right infrahilar mass. 3.  Decreased mediastinal lymphadenopathy. 4.  Stable diffuse sclerotic bone metastases. 5.  No new or progressive disease within the thorax.  CT ABDOMEN AND PELVIS  Findings:  No liver masses are identified and there is no evidence of biliary ductal dilatation.  The gallbladder, pancreas, spleen, adrenal glands, and kidneys are normal appearance  except for ptosis of the right kidney.  No evidence of renal mass or hydronephrosis.  Enlarged uterus is again demonstrated likely due to fibroids.  No other soft tissue masses or lymphadenopathy identified within the abdomen or pelvis.  No evidence of inflammatory process or ascites. No evidence of bowel wall thickening or dilatation.  Diffuse sclerotic bone metastases show no significant change in appearance.  IMPRESSION:  1.  No evidence of soft tissue metastatic disease or other acute findings within the abdomen or pelvis. 2.  Stable diffuse sclerotic bone metastases. 3.  Stable enlarged uterus likely secondary to fibroids.   Original Report Authenticated By: Danae Orleans, M.D.    Ct Abdomen Pelvis W Contrast  01/11/2012  *RADIOLOGY REPORT*  Clinical Data:  Metastatic lung carcinoma.  Currently undergoing oral chemotherapy.  Shortness of breath and cough.  Previous radiation therapy.  CT CHEST, ABDOMEN AND PELVIS WITH CONTRAST  Technique:  Multidetector CT imaging of the chest, abdomen and pelvis was performed following the standard protocol during bolus administration of intravenous contrast.  Contrast: OMNIPAQUE IOHEXOL 300 MG/ML  SOLN  Comparison:  Chest CT on 10/10/2011 and PET CT on 09/20/2011  CT CHEST  Findings:  A moderate size malignant right pleural effusion shows no significant change in size. The right mainstem bronchus is now patent, and there is now some aeration of the atelectatic right lung.  A solid mass like opacity is seen in the right infrahilar region, which measures approximately 3.0 x 5.9 cm and this probably mildly decreased size compared to previous study when a similar appearing density measure approximately 4.4 x 6.0 cm.  No suspicious pulmonary nodules or masses are seen in the left lung.  Mild patchy ground-glass air opacity in the left lung shows improvement since previous study.  There is no evidence of left- sided pleural effusion.  Decreased mediastinal lymphadenopathy  is seen in the right paratracheal, precarinal, subcarinal, and AP window regions.  No new or increased lymphadenopathy is identified.  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  There is no evidence of dilated bowel loops. Diffuse sclerotic bone metastases show no significant change in overall appearance compared to previous study.  IMPRESSION:  1.  Stable moderate malignant right pleural effusion. 2.  Improved aeration of  right lung, with probable mild decrease in size of right infrahilar mass. 3.  Decreased mediastinal lymphadenopathy. 4.  Stable diffuse sclerotic bone metastases. 5.  No new or progressive disease within the thorax.  CT ABDOMEN AND PELVIS  Findings:  No liver masses are identified and there is no evidence of biliary ductal dilatation.  The gallbladder, pancreas, spleen, adrenal glands, and kidneys are normal appearance except for ptosis of the right kidney.  No evidence of renal mass or hydronephrosis.  Enlarged uterus is again demonstrated likely due to fibroids.  No other soft tissue masses or lymphadenopathy identified within the abdomen or pelvis.  No evidence of inflammatory process or ascites. No evidence of bowel wall thickening or dilatation.  Diffuse sclerotic bone metastases show no significant change in appearance.  IMPRESSION:  1.  No evidence of soft tissue metastatic disease or other acute findings within the abdomen or pelvis. 2.  Stable diffuse sclerotic bone metastases. 3.  Stable enlarged uterus likely secondary to fibroids.   Original Report Authenticated By: Danae Orleans, M.D.     ASSESSMENT/PLAN: This is a very pleasant 51 years old African American female with metastatic non-small cell lung cancer, adenocarcinoma with positive EGFR mutation, currently on treatment with [name] 150 mg by mouth daily for the last 4 months. The patient is tolerating her treatment fairly well and she has evidence for partial response of her disease. The patient  was discussed with Dr. Truett Perna in Dr. Asa Lente absence. She will continue on [name] at 150 mg by mouth daily. She will receive her Xgeva injection today as scheduled. She is given a prescription for her Percocet tablets. She'll followup in one month with repeat CBC differential and C. met. She will also be due for her next Xgeva injection next month. Regarding her acid reflux symptoms, she may take either Pepcid or Zantac approximately 10-12 hours after her [name] or she may take times either 2 hours before or 2 hours after her [name]. Patient voiced understanding.  Laural Benes, Bevin Mayall E, PA-C   All questions were answered. The patient knows to call the clinic with any problems, questions or concerns. We can certainly see the patient much sooner if necessary.  I spent 20 minutes counseling the patient face to face. The total time spent in the appointment was 30 minutes.

## 2012-02-25 ENCOUNTER — Telehealth: Payer: Self-pay | Admitting: Family Medicine

## 2012-02-25 NOTE — Telephone Encounter (Signed)
I would not recommend making this change.  Even going to 15 mg would not last one day duration.

## 2012-02-25 NOTE — Telephone Encounter (Signed)
Pt would like to know if you will change current oxycodone from 5 mg to 15mg  so she will only have to take one pill a day.

## 2012-02-28 NOTE — Telephone Encounter (Signed)
Sig is 1-2 tabs every 6 hours prn pain.

## 2012-02-28 NOTE — Telephone Encounter (Signed)
Pt informed.  Pt is taking oxycodone 5 mg 3 times a day and is running out.  Last refill was for #60 with 0 refills on 02/18/12.  Pt requesting #90 at next refill.

## 2012-03-02 ENCOUNTER — Telehealth: Payer: Self-pay | Admitting: Family Medicine

## 2012-03-02 ENCOUNTER — Other Ambulatory Visit: Payer: Self-pay | Admitting: Medical Oncology

## 2012-03-02 NOTE — Telephone Encounter (Signed)
Permanent handicap parking placard filled out, signed and pt aware

## 2012-03-02 NOTE — Telephone Encounter (Signed)
Pt would like a handicap renewal form completed. Please call patient Beth [name]. Pt stated our office has forms

## 2012-03-02 NOTE — Telephone Encounter (Addendum)
Pt states she is taking oxycodone 15 mg daily -(  she takes 3 of the 5 mg tablets) and requests refill. She also needs a handicap application  because her other one got wet. I called pt back and left message to please clarify what dose of oxycodone she is taking .

## 2012-03-08 ENCOUNTER — Other Ambulatory Visit: Payer: Self-pay | Admitting: *Deleted

## 2012-03-08 NOTE — Telephone Encounter (Signed)
Pt requesting refill of Oxycodone, one tab TID.  Pt is calling 3 days early.  Requesting #90.

## 2012-03-08 NOTE — Telephone Encounter (Signed)
Refill OK

## 2012-03-09 MED ORDER — OXYCODONE-ACETAMINOPHEN 5-325 MG PO TABS: 1.0000 | ORAL_TABLET | Freq: Three times a day (TID) | ORAL | Status: DC | PRN

## 2012-03-09 NOTE — Telephone Encounter (Signed)
Pt informed Rx ready to pick-up tomorrow

## 2012-03-10 ENCOUNTER — Encounter: Payer: Self-pay | Admitting: Medical Oncology

## 2012-03-10 ENCOUNTER — Telehealth: Payer: Self-pay | Admitting: *Deleted

## 2012-03-10 ENCOUNTER — Telehealth: Payer: Self-pay | Admitting: Medical Oncology

## 2012-03-10 NOTE — Telephone Encounter (Signed)
We can get her on plain Oxycodone 5 mg.  Is she willing to take the current prescription?  This low dose of acetaminophen does not usually cause stomach upset.

## 2012-03-10 NOTE — Telephone Encounter (Signed)
Pt will take the oxycodone with acetaminophen this month.  I have removed this med from her med list.

## 2012-03-10 NOTE — Progress Notes (Signed)
I mailed handicap placard application to pt

## 2012-03-10 NOTE — Telephone Encounter (Signed)
Received PC from pt requesting requesting the plain Oxycodone 5mg , the Oxycodone with acetaminophen upsets her stomach, we filled this one for her yesterday. She tried to return the meds and the pharmacist would not take them back.  Please advise how to handle this.

## 2012-03-14 ENCOUNTER — Telehealth: Payer: Self-pay | Admitting: *Deleted

## 2012-03-14 NOTE — Telephone Encounter (Addendum)
PT. WILL CALL SCHEDULING. SHE ALSO WAS CHECKING TO SEE IF THIS OFFICE HAS RECEIVED THE PAPER WORK CONCERNING DECLINING TO PAY FOR [name] FROM HER NEW INSURANCE COMPANY. INFORMED PT. THERE IS A NOTE CONCERNING PAPER WORK WHICH WILL BE GIVEN TO PT. TO PERHAPS HELP WITH PAYING FOR [name]. SHE VOICES UNDERSTANDING. THIS NOTE TO DR.MOHAMED'S NURSE'S DESK.

## 2012-03-16 ENCOUNTER — Telehealth: Payer: Self-pay | Admitting: Internal Medicine

## 2012-03-16 ENCOUNTER — Other Ambulatory Visit: Payer: Self-pay | Admitting: Medical Oncology

## 2012-03-16 ENCOUNTER — Ambulatory Visit: Payer: 59 | Admitting: Physician Assistant

## 2012-03-16 ENCOUNTER — Ambulatory Visit: Payer: 59

## 2012-03-16 ENCOUNTER — Other Ambulatory Visit: Payer: 59 | Admitting: Lab

## 2012-03-16 NOTE — Telephone Encounter (Signed)
Beth [name] will pick up tarceva assistance papers on jan 2

## 2012-03-16 NOTE — Telephone Encounter (Signed)
pt called to cx and r/s her appts to the first of the year as she will have ins then.    done,aware

## 2012-03-17 ENCOUNTER — Other Ambulatory Visit: Payer: 59 | Admitting: Lab

## 2012-03-17 ENCOUNTER — Ambulatory Visit: Payer: 59

## 2012-03-20 ENCOUNTER — Other Ambulatory Visit: Payer: Self-pay | Admitting: *Deleted

## 2012-03-20 ENCOUNTER — Telehealth: Payer: Self-pay | Admitting: *Deleted

## 2012-03-20 DIAGNOSIS — C3490 Malignant neoplasm of unspecified part of unspecified bronchus or lung: Secondary | ICD-10-CM

## 2012-03-20 MED ORDER — OXYCODONE HCL 5 MG PO CAPS: 5.0000 mg | ORAL_CAPSULE | ORAL | Status: DC

## 2012-03-20 NOTE — Telephone Encounter (Signed)
Pt called regarding her tarceva financial application.  Informed pt that financial application for [employer] is at Dr [facility] RN desk and she needs to fill out and sign the 'pt authorization and notice of release of info', then we can fax in the application.  She states she already has a copy of it and she will fill hers out and bring it to the office Tuesday 12/31.  Pt had a lot of financial questions, referred her to speak to Raquel in financial counseling who can help her with her questions and concerns.    Pt also stated that she had her rx of oxycodone by the sink when she was washing her hands and her dog jumped up, pushed her, and her oxycodone fell down the sink.  Per dr Donnald Garre, will only give pt a small quantity of the oxycodone and pt is to be more careful in the future.  SLJ

## 2012-03-20 NOTE — Telephone Encounter (Signed)
Pt left msg that percocet rx was ruined when she dropped it in the sink and she needs another one.  Per Dr Donnald Garre, pt needs to bring in rx so we can see that it is ruined and will write her a new one.  Left msg on pt's home vm with instructions.  SLJ

## 2012-03-21 ENCOUNTER — Telehealth: Payer: Self-pay | Admitting: *Deleted

## 2012-03-21 NOTE — Telephone Encounter (Signed)
Statement of medical necessity filled out, signed by Dr Donnald Garre and patient, faxed to [employer] [phone].  Will scan copy into EP[employer] and keep copy with Dr Donnald Garre desk RN for reference.  SLJ

## 2012-03-23 ENCOUNTER — Encounter: Payer: Self-pay | Admitting: Physician Assistant

## 2012-03-23 ENCOUNTER — Ambulatory Visit (HOSPITAL_BASED_OUTPATIENT_CLINIC_OR_DEPARTMENT_OTHER): Payer: 59

## 2012-03-23 ENCOUNTER — Other Ambulatory Visit (HOSPITAL_BASED_OUTPATIENT_CLINIC_OR_DEPARTMENT_OTHER): Payer: 59 | Admitting: Lab

## 2012-03-23 ENCOUNTER — Ambulatory Visit (HOSPITAL_BASED_OUTPATIENT_CLINIC_OR_DEPARTMENT_OTHER): Payer: 59 | Admitting: Physician Assistant

## 2012-03-23 VITALS — BP 146/96 | HR 101 | Temp 97.1°F | Resp 20 | Ht 66.0 in | Wt 234.0 lb

## 2012-03-23 DIAGNOSIS — C3490 Malignant neoplasm of unspecified part of unspecified bronchus or lung: Secondary | ICD-10-CM

## 2012-03-23 DIAGNOSIS — C7952 Secondary malignant neoplasm of bone marrow: Secondary | ICD-10-CM

## 2012-03-23 DIAGNOSIS — C3400 Malignant neoplasm of unspecified main bronchus: Secondary | ICD-10-CM

## 2012-03-23 DIAGNOSIS — C3410 Malignant neoplasm of upper lobe, unspecified bronchus or lung: Secondary | ICD-10-CM

## 2012-03-23 LAB — CBC WITH DIFFERENTIAL/PLATELET
BASO%: 0.3 % (ref 0.0–2.0)
Basophils Absolute: 0 10*3/uL (ref 0.0–0.1)
EOS%: 1.9 % (ref 0.0–7.0)
HGB: 10.9 g/dL — ABNORMAL LOW (ref 11.6–15.9)
MCH: 29 pg (ref 25.1–34.0)
MCV: 90.7 fL (ref 79.5–101.0)
MONO%: 8.5 % (ref 0.0–14.0)
NEUT#: 4.5 10*3/uL (ref 1.5–6.5)
RBC: 3.76 10*6/uL (ref 3.70–5.45)
RDW: 15.7 % — ABNORMAL HIGH (ref 11.2–14.5)
WBC: 6.9 10*3/uL (ref 3.9–10.3)
lymph#: 1.6 10*3/uL (ref 0.9–3.3)

## 2012-03-23 LAB — COMPREHENSIVE METABOLIC PANEL (CC13)
ALT: 7 U/L (ref 0–55)
AST: 14 U/L (ref 5–34)
Albumin: 3.4 g/dL — ABNORMAL LOW (ref 3.5–5.0)
Alkaline Phosphatase: 109 U/L (ref 40–150)
BUN: 11 mg/dL (ref 7.0–26.0)
Calcium: 9 mg/dL (ref 8.4–10.4)
Chloride: 107 mEq/L (ref 98–107)
Potassium: 4.3 mEq/L (ref 3.5–5.1)
Sodium: 136 mEq/L (ref 136–145)
Total Protein: 7.8 g/dL (ref 6.4–8.3)

## 2012-03-23 MED ORDER — ALBUTEROL SULFATE (2.5 MG/3ML) 0.083% IN NEBU
2.5000 mg | INHALATION_SOLUTION | Freq: Once | RESPIRATORY_TRACT | Status: DC | PRN
  Filled 2012-03-23: qty 3

## 2012-03-23 MED ORDER — SODIUM CHLORIDE 0.9 % IV SOLN: 1000.0000 mL | Freq: Once | INTRAVENOUS | Status: DC

## 2012-03-23 MED ORDER — DENOSUMAB 120 MG/1.7ML ~~LOC~~ SOLN
120.0000 mg | Freq: Once | SUBCUTANEOUS | Status: AC
  Administered 2012-03-23: 120 mg via SUBCUTANEOUS
  Filled 2012-03-23: qty 1.7

## 2012-03-23 NOTE — Patient Instructions (Addendum)
Continue taking [name] 150 mg by mouth daily Follow up with Dr. Arbutus Ped in 1 month with a restaging CT scan of your chest, abdomen and pelvis

## 2012-03-24 ENCOUNTER — Other Ambulatory Visit: Payer: Self-pay | Admitting: *Deleted

## 2012-03-24 ENCOUNTER — Telehealth: Payer: Self-pay | Admitting: Internal Medicine

## 2012-03-24 DIAGNOSIS — C3490 Malignant neoplasm of unspecified part of unspecified bronchus or lung: Secondary | ICD-10-CM

## 2012-03-24 MED ORDER — ERLOTINIB HCL 150 MG PO TABS: 150.0000 mg | ORAL_TABLET | Freq: Every day | ORAL | Status: DC

## 2012-03-24 NOTE — Telephone Encounter (Signed)
called pt with appt info    Beth [name]

## 2012-03-25 NOTE — Progress Notes (Signed)
[facility] [phone]   [phone]  OFFICE PROGRESS NOTE  [name], MD [address] [place] [address]  DIAGNOSIS: Metastatic non-small cell lung cancer, adenocarcinoma with positive EGFR mutation in exon 19 diagnosed in June of 2013   PRIOR THERAPY: palliative radiotherapy to the right lung under the care of Dr. Dayton Scrape expected to be completed on 10/29/2011.   CURRENT THERAPY:  1. [name] 150 mg by mouth daily started 10/11/2011. Status post 5 months of therapy 2. Xgeva 120 mg subcutaneously every 4 weeks  INTERVAL HISTORY: Beth [name] 52 y.o. female returns to the clinic today for followup visit. The patient is feeling fine today with no specific complaints except for occasional difficulty swallowing. She denied having any significant chest pain, cough or hemoptysis. She denied having any significant weight loss or night sweats. In fact she has gained weight. She is tolerating her treatment was [name] fairly well except for grade 1 skin rash and occasional diarrhea. She continues to complain of right side/right lung pain.  MEDICAL HISTORY: Past Medical History  Diagnosis Date  . Asthma   . GERD (gastroesophageal reflux disease)   . Hypertension   . Heart murmur   . Fibromyalgia   . Arthritis of knee, degenerative   . Endocarditis     as teenager  . Complication of anesthesia     difficulty waking up  . Shortness of breath   . Lung mass     R- ADENOCARCINOMA  . Sleep apnea     no longer using CPAP  . Pleural effusion 08/30/11  . 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 Anxiety   . Depression   . History of radiation therapy 09/29/11-11/04/2011    right lung 2700cGy 15 sessions    ALLERGIES:  is allergic to meloxicam.  MEDICATIONS:  Current Outpatient Prescriptions  Medication Sig Dispense Refill  . ARIPiprazole (ABILIFY) 10 MG tablet Take 1 tablet (10 mg total) by mouth daily.  90 tablet  1  .  chlorpheniramine-HYDROcodone (TUSSIONEX) 10-8 MG/5ML LQCR TAKE (1 TEASPOONFUL) BY MOUTH EVERY 12 HOURS AS NEEDED  140 mL  0  . clindamycin (CLEOCIN T) 1 % external solution APPLY TOPICALLY TWICE A DAY  240 mL  3  . diphenhydrAMINE (BENADRYL) 25 mg capsule One to two tabs every 6 hours prn allergies  120 capsule  3  . erlotinib ([name]) 150 MG tablet Take 1 tablet (150 mg total) by mouth daily.  30 tablet  2  . Fe Fum-FePoly-Vit C-Vit B3 (INTEGRA) 62.5-62.5-40-3 MG CAPS Take 1 tablet by mouth daily.      . fish oil-omega-3 fatty acids 1000 MG capsule Take 2 g by mouth daily.      Marland Kitchen lamoTRIgine (LAMICTAL) 25 MG tablet Take 1 tablet (25 mg total) by mouth 2 (two) times daily.  180 tablet  1  . Multiple Vitamins-Minerals (MULTIVITAMIN WITH MINERALS) tablet Take 1 tablet by mouth daily.      Marland Kitchen oxycodone (OXY-IR) 5 MG capsule Take 1 capsule (5 mg total) by mouth as directed. 1-2 tabs every 6 hours prn pain  20 capsule  0  . pantoprazole (PROTONIX) 40 MG tablet Take 1 tablet (40 mg total) by mouth daily.  90 tablet  1  . prochlorperazine (COMPAZINE) 10 MG tablet Take 1 tablet (10 mg total) by mouth every 6 (six) hours as needed.  30 tablet  1  . Venlafaxine HCl 150 MG  TB24 Take 150 mg by mouth daily.      . Vitamin D, Ergocalciferol, (DRISDOL) 50000 UNITS CAPS Take 1 capsule (50,000 Units total) by mouth every 7 (seven) days. On wednesdays  12 capsule  3    SURGICAL HISTORY:  Past Surgical History  Procedure Date  . Tubal ligation 2002  . Thoracentesis 09/02/11, 09/09/11    right-side pleural effusion    REVIEW OF SYSTEMS:  Pertinent items are noted in HPI.   PHYSICAL EXAMINATION: General appearance: alert, cooperative and no distress Head: Normocephalic, without obvious abnormality, atraumatic Neck: no adenopathy Lymph nodes: Cervical, supraclavicular, and axillary nodes normal. Resp: clear to auscultation bilaterally Cardio: regular rate and rhythm, S1, S2 normal, no murmur, click, rub or  gallop GI: soft, non-tender; bowel sounds normal; no masses,  no organomegaly Extremities: extremities normal, atraumatic, no cyanosis or edema Neurologic: Alert and oriented X 3, normal strength and tone. Normal symmetric reflexes. Normal coordination and gait Skin: Acneiform eruptions scattered about the face minimal erythema, no evidence of superinfection  ECOG PERFORMANCE STATUS: 1 - Symptomatic but completely ambulatory  Blood pressure 146/96, pulse 101, temperature 97.1 F (36.2 C), temperature source Oral, resp. rate 20, height 5\' 6"  (1.676 m), weight 234 lb (106.142 kg).  LABORATORY DATA: Lab Results  Component Value Date   WBC 6.9 03/23/2012   HGB 10.9* 03/23/2012   HCT 34.1* 03/23/2012   MCV 90.7 03/23/2012   PLT 320 03/23/2012      Chemistry      Component Value Date/Time   NA 136 03/23/2012 1221   NA 137 10/26/2011 1616   K 4.3 03/23/2012 1221   K 3.2* 10/26/2011 1616   CL 107 03/23/2012 1221   CL 97 10/26/2011 1616   CO2 26 03/23/2012 1221   CO2 31 10/26/2011 1616   BUN 11.0 03/23/2012 1221   BUN 9 10/26/2011 1616   CREATININE 0.7 03/23/2012 1221   CREATININE 0.61 10/26/2011 1616      Component Value Date/Time   CALCIUM 9.0 03/23/2012 1221   CALCIUM 9.3 10/26/2011 1616   ALKPHOS 109 03/23/2012 1221   ALKPHOS 124* 10/26/2011 1616   AST 14 03/23/2012 1221   AST 32 10/26/2011 1616   ALT 7 03/23/2012 1221   ALT 19 10/26/2011 1616   BILITOT 0.87 03/23/2012 1221   BILITOT 1.0 10/26/2011 1616       RADIOGRAPHIC STUDIES: Ct Chest W Contrast  01/11/2012  *RADIOLOGY REPORT*  Clinical Data:  Metastatic lung carcinoma.  Currently undergoing oral chemotherapy.  Shortness of breath and cough.  Previous radiation therapy.  CT CHEST, ABDOMEN AND PELVIS WITH CONTRAST  Technique:  Multidetector CT imaging of the chest, abdomen and pelvis was performed following the standard protocol during bolus administration of intravenous contrast.  Contrast: OMNIPAQUE IOHEXOL 300 MG/ML  SOLN  Comparison:  Chest CT on  10/10/2011 and PET CT on 09/20/2011  CT CHEST  Findings:  A moderate size malignant right pleural effusion shows no significant change in size. The right mainstem bronchus is now patent, and there is now some aeration of the atelectatic right lung.  A solid mass like opacity is seen in the right infrahilar region, which measures approximately 3.0 x 5.9 cm and this probably mildly decreased size compared to previous study when a similar appearing density measure approximately 4.4 x 6.0 cm.  No suspicious pulmonary nodules or masses are seen in the left lung.  Mild patchy ground-glass air opacity in the left lung shows improvement since previous  study.  There is no evidence of left- sided pleural effusion.  Decreased mediastinal lymphadenopathy is seen in the right paratracheal, precarinal, subcarinal, and AP window regions.  No new or increased lymphadenopathy is identified.  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  There is no evidence of dilated bowel loops. Diffuse sclerotic bone metastases show no significant change in overall appearance compared to previous study.  IMPRESSION:  1.  Stable moderate malignant right pleural effusion. 2.  Improved aeration of right lung, with probable mild decrease in size of right infrahilar mass. 3.  Decreased mediastinal lymphadenopathy. 4.  Stable diffuse sclerotic bone metastases. 5.  No new or progressive disease within the thorax.  CT ABDOMEN AND PELVIS  Findings:  No liver masses are identified and there is no evidence of biliary ductal dilatation.  The gallbladder, pancreas, spleen, adrenal glands, and kidneys are normal appearance except for ptosis of the right kidney.  No evidence of renal mass or hydronephrosis.  Enlarged uterus is again demonstrated likely due to fibroids.  No other soft tissue masses or lymphadenopathy identified within the abdomen or pelvis.  No evidence of inflammatory process or ascites. No evidence of bowel wall  thickening or dilatation.  Diffuse sclerotic bone metastases show no significant change in appearance.  IMPRESSION:  1.  No evidence of soft tissue metastatic disease or other acute findings within the abdomen or pelvis. 2.  Stable diffuse sclerotic bone metastases. 3.  Stable enlarged uterus likely secondary to fibroids.   Original Report Authenticated By: Danae Orleans, M.D.    Ct Abdomen Pelvis W Contrast  01/11/2012  *RADIOLOGY REPORT*  Clinical Data:  Metastatic lung carcinoma.  Currently undergoing oral chemotherapy.  Shortness of breath and cough.  Previous radiation therapy.  CT CHEST, ABDOMEN AND PELVIS WITH CONTRAST  Technique:  Multidetector CT imaging of the chest, abdomen and pelvis was performed following the standard protocol during bolus administration of intravenous contrast.  Contrast: OMNIPAQUE IOHEXOL 300 MG/ML  SOLN  Comparison:  Chest CT on 10/10/2011 and PET CT on 09/20/2011  CT CHEST  Findings:  A moderate size malignant right pleural effusion shows no significant change in size. The right mainstem bronchus is now patent, and there is now some aeration of the atelectatic right lung.  A solid mass like opacity is seen in the right infrahilar region, which measures approximately 3.0 x 5.9 cm and this probably mildly decreased size compared to previous study when a similar appearing density measure approximately 4.4 x 6.0 cm.  No suspicious pulmonary nodules or masses are seen in the left lung.  Mild patchy ground-glass air opacity in the left lung shows improvement since previous study.  There is no evidence of left- sided pleural effusion.  Decreased mediastinal lymphadenopathy is seen in the right paratracheal, precarinal, subcarinal, and AP window regions.  No new or increased lymphadenopathy is identified.  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  There is no evidence of dilated bowel loops. Diffuse sclerotic bone metastases show no significant  change in overall appearance compared to previous study.  IMPRESSION:  1.  Stable moderate malignant right pleural effusion. 2.  Improved aeration of right lung, with probable mild decrease in size of right infrahilar mass. 3.  Decreased mediastinal lymphadenopathy. 4.  Stable diffuse sclerotic bone metastases. 5.  No new or progressive disease within the thorax.  CT ABDOMEN AND PELVIS  Findings:  No liver masses are identified and there is no evidence  of biliary ductal dilatation.  The gallbladder, pancreas, spleen, adrenal glands, and kidneys are normal appearance except for ptosis of the right kidney.  No evidence of renal mass or hydronephrosis.  Enlarged uterus is again demonstrated likely due to fibroids.  No other soft tissue masses or lymphadenopathy identified within the abdomen or pelvis.  No evidence of inflammatory process or ascites. No evidence of bowel wall thickening or dilatation.  Diffuse sclerotic bone metastases show no significant change in appearance.  IMPRESSION:  1.  No evidence of soft tissue metastatic disease or other acute findings within the abdomen or pelvis. 2.  Stable diffuse sclerotic bone metastases. 3.  Stable enlarged uterus likely secondary to fibroids.   Original Report Authenticated By: Danae Orleans, M.D.     ASSESSMENT/PLAN: This is a very pleasant 52 years old African American female with metastatic non-small cell lung cancer, adenocarcinoma with positive EGFR mutation, currently on treatment with [name] 150 mg by mouth daily for the last 4 months. The patient is tolerating her treatment fairly well and she has evidence for partial response of her disease. The patient was discussed with Dr. Arbutus Ped. She will continue on [name] at 150 mg by mouth daily. She will receive her Xgeva injection today as scheduled.  She'll follow up with Dr.Mohamen in one month with repeat CBC differential and C. Met and a restaging CT scan of the chest, abdomen and pelvis to re-evaluate her  disease.Marland Kitchen She will also be due for her next Xgeva injection next month.   Beth [name], Beth Eisinger E, PA-C   All questions were answered. The patient knows to call the clinic with any problems, questions or concerns. We can certainly see the patient much sooner if necessary.  I spent 20 minutes counseling the patient face to face. The total time spent in the appointment was 30 minutes.

## 2012-03-28 ENCOUNTER — Telehealth: Payer: Self-pay | Admitting: Medical Oncology

## 2012-03-28 NOTE — Telephone Encounter (Signed)
[name] will pick up her Beth [name] pt financial attestation on the day of her CT scan 04/17/12. Form at nurses desk

## 2012-03-30 NOTE — Telephone Encounter (Signed)
I called pt and told her to call [facility] at [phone]

## 2012-04-10 ENCOUNTER — Telehealth: Payer: Self-pay | Admitting: Family Medicine

## 2012-04-10 DIAGNOSIS — C3490 Malignant neoplasm of unspecified part of unspecified bronchus or lung: Secondary | ICD-10-CM

## 2012-04-10 MED ORDER — OXYCODONE HCL 5 MG PO CAPS: 5.0000 mg | ORAL_CAPSULE | ORAL | Status: DC

## 2012-04-10 NOTE — Telephone Encounter (Signed)
Oxycodone last filled 03-20-12, #20 with 0 refills

## 2012-04-10 NOTE — Telephone Encounter (Signed)
Pt informed ready to pick up

## 2012-04-10 NOTE — Telephone Encounter (Signed)
Pt requesting refill on oxycodone (OXY-IR) 5 MG capsule please call when ready.

## 2012-04-10 NOTE — Telephone Encounter (Signed)
Refill OK.  I would refill for #30.

## 2012-04-14 ENCOUNTER — Other Ambulatory Visit: Payer: 59 | Admitting: Lab

## 2012-04-14 ENCOUNTER — Ambulatory Visit: Payer: 59

## 2012-04-17 ENCOUNTER — Ambulatory Visit (HOSPITAL_COMMUNITY): Payer: BC Managed Care – PPO

## 2012-04-20 ENCOUNTER — Ambulatory Visit: Payer: 59

## 2012-04-20 ENCOUNTER — Other Ambulatory Visit: Payer: 59 | Admitting: Lab

## 2012-04-20 ENCOUNTER — Ambulatory Visit: Payer: 59 | Admitting: Internal Medicine

## 2012-04-20 ENCOUNTER — Telehealth: Payer: Self-pay | Admitting: Internal Medicine

## 2012-04-20 ENCOUNTER — Ambulatory Visit (HOSPITAL_COMMUNITY): Payer: BC Managed Care – PPO

## 2012-04-20 NOTE — Telephone Encounter (Signed)
pt called in to r/s her appts for today due to weather     anne

## 2012-04-25 ENCOUNTER — Ambulatory Visit (HOSPITAL_BASED_OUTPATIENT_CLINIC_OR_DEPARTMENT_OTHER): Payer: BC Managed Care – PPO | Admitting: Lab

## 2012-04-25 ENCOUNTER — Ambulatory Visit (HOSPITAL_COMMUNITY)
Admission: RE | Admit: 2012-04-25 | Discharge: 2012-04-25 | Disposition: A | Discharge status: Home / Self Care | Payer: BC Managed Care – PPO | Source: Ambulatory Visit | Attending: Physician Assistant | Admitting: Physician Assistant

## 2012-04-25 ENCOUNTER — Ambulatory Visit (HOSPITAL_BASED_OUTPATIENT_CLINIC_OR_DEPARTMENT_OTHER): Payer: BC Managed Care – PPO | Admitting: Internal Medicine

## 2012-04-25 ENCOUNTER — Encounter: Payer: Self-pay | Admitting: *Deleted

## 2012-04-25 ENCOUNTER — Ambulatory Visit (HOSPITAL_BASED_OUTPATIENT_CLINIC_OR_DEPARTMENT_OTHER): Payer: BC Managed Care – PPO

## 2012-04-25 ENCOUNTER — Encounter: Payer: Self-pay | Admitting: Internal Medicine

## 2012-04-25 ENCOUNTER — Telehealth: Payer: Self-pay | Admitting: Internal Medicine

## 2012-04-25 VITALS — BP 155/101 | HR 125 | Temp 97.5°F | Resp 20 | Ht 66.0 in | Wt 235.2 lb

## 2012-04-25 VITALS — BP 158/96 | HR 105 | Temp 98.3°F

## 2012-04-25 DIAGNOSIS — C7952 Secondary malignant neoplasm of bone marrow: Secondary | ICD-10-CM

## 2012-04-25 DIAGNOSIS — I059 Rheumatic mitral valve disease, unspecified: Secondary | ICD-10-CM | POA: Insufficient documentation

## 2012-04-25 DIAGNOSIS — C7951 Secondary malignant neoplasm of bone: Secondary | ICD-10-CM

## 2012-04-25 DIAGNOSIS — C3490 Malignant neoplasm of unspecified part of unspecified bronchus or lung: Secondary | ICD-10-CM

## 2012-04-25 DIAGNOSIS — C3400 Malignant neoplasm of unspecified main bronchus: Secondary | ICD-10-CM

## 2012-04-25 DIAGNOSIS — K449 Diaphragmatic hernia without obstruction or gangrene: Secondary | ICD-10-CM | POA: Insufficient documentation

## 2012-04-25 DIAGNOSIS — J9819 Other pulmonary collapse: Secondary | ICD-10-CM | POA: Insufficient documentation

## 2012-04-25 DIAGNOSIS — K7689 Other specified diseases of liver: Secondary | ICD-10-CM | POA: Insufficient documentation

## 2012-04-25 DIAGNOSIS — R0602 Shortness of breath: Secondary | ICD-10-CM | POA: Insufficient documentation

## 2012-04-25 DIAGNOSIS — R222 Localized swelling, mass and lump, trunk: Secondary | ICD-10-CM | POA: Insufficient documentation

## 2012-04-25 LAB — CBC WITH DIFFERENTIAL/PLATELET
EOS%: 0.5 % (ref 0.0–7.0)
Eosinophils Absolute: 0 10*3/uL (ref 0.0–0.5)
MCH: 29.9 pg (ref 25.1–34.0)
MCV: 89.1 fL (ref 79.5–101.0)
MONO%: 6.9 % (ref 0.0–14.0)
NEUT#: 4.5 10*3/uL (ref 1.5–6.5)
RBC: 3.81 10*6/uL (ref 3.70–5.45)
RDW: 16.9 % — ABNORMAL HIGH (ref 11.2–14.5)
lymph#: 1.5 10*3/uL (ref 0.9–3.3)

## 2012-04-25 LAB — COMPREHENSIVE METABOLIC PANEL (CC13)
AST: 25 U/L (ref 5–34)
Albumin: 3.5 g/dL (ref 3.5–5.0)
Alkaline Phosphatase: 135 U/L (ref 40–150)
Calcium: 9.2 mg/dL (ref 8.4–10.4)
Chloride: 100 mEq/L (ref 98–107)
Potassium: 4.5 mEq/L (ref 3.5–5.1)
Sodium: 139 mEq/L (ref 136–145)
Total Protein: 8.2 g/dL (ref 6.4–8.3)

## 2012-04-25 MED ORDER — IOHEXOL 300 MG/ML  SOLN
50.0000 mL | Freq: Once | INTRAMUSCULAR | Status: AC | PRN
  Administered 2012-04-25: 50 mL via ORAL

## 2012-04-25 MED ORDER — IOHEXOL 300 MG/ML  SOLN
100.0000 mL | Freq: Once | INTRAMUSCULAR | Status: AC | PRN
  Administered 2012-04-25: 100 mL via INTRAVENOUS

## 2012-04-25 MED ORDER — DENOSUMAB 120 MG/1.7ML ~~LOC~~ SOLN
120.0000 mg | Freq: Once | SUBCUTANEOUS | Status: AC
  Administered 2012-04-25: 120 mg via SUBCUTANEOUS
  Filled 2012-04-25: qty 1.7

## 2012-04-25 MED ORDER — OXYCODONE HCL 5 MG PO CAPS: ORAL_CAPSULE | ORAL | Status: DC

## 2012-04-25 NOTE — Telephone Encounter (Signed)
gv and printed appt schedule for March thru May.Marland KitchenMarland KitchenMarland Kitchen

## 2012-04-25 NOTE — Progress Notes (Signed)
[facility] [phone]   Fax:([phone]  OFFICE PROGRESS NOTE  [name], MD [address] [place] [address]  DIAGNOSIS: Metastatic non-small cell lung cancer, adenocarcinoma with positive EGFR mutation in exon 19 diagnosed in June of 2013   PRIOR THERAPY: palliative radiotherapy to the right lung under the care of Dr. Dayton Scrape expected to be completed on 10/29/2011.   CURRENT THERAPY:  1. [name] 150 mg by mouth daily started 10/11/2011. Status post 6 months of therapy 2. Xgeva 120 mg subcutaneously every 4 weeks  INTERVAL HISTORY: Beth [name] 52 y.o. female returns to the clinic today for routine followup visit. The patient is feeling fine today with no specific complaints except for mild pain at the right chest with radiation to the back. She is currently on OxyIR on as-needed basis. She denied having any significant chest pain but continues to have shortness breath with exertion, no cough or hemoptysis. She denied having any significant weight loss or night sweats. She has mild skin rash and few episodes of diarrhea. She is tolerating her treatment with [name] fairly well with no significant adverse effects except as mentioned above. The patient has repeat CT scan of the chest, abdomen and pelvis performed recently and she is here for evaluation and discussion of her scan results.  MEDICAL HISTORY: Past Medical History  Diagnosis Date  . Asthma   . GERD (gastroesophageal reflux disease)   . Hypertension   . Heart murmur   . Fibromyalgia   . Arthritis of knee, degenerative   . Endocarditis     as teenager  . Complication of anesthesia     difficulty waking up  . Shortness of breath   . Lung mass     R- ADENOCARCINOMA  . Sleep apnea     no longer using CPAP  . Pleural effusion 08/30/11  . 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 Anxiety   . Depression   . History of radiation therapy 09/29/11-11/04/2011   right lung 2700cGy 15 sessions    ALLERGIES:  is allergic to meloxicam.  MEDICATIONS:  Current Outpatient Prescriptions  Medication Sig Dispense Refill  . ARIPiprazole (ABILIFY) 10 MG tablet Take 1 tablet (10 mg total) by mouth daily.  90 tablet  1  . chlorpheniramine-HYDROcodone (TUSSIONEX) 10-8 MG/5ML LQCR TAKE (1 TEASPOONFUL) BY MOUTH EVERY 12 HOURS AS NEEDED  140 mL  0  . clindamycin (CLEOCIN T) 1 % external solution APPLY TOPICALLY TWICE A DAY  240 mL  3  . diphenhydrAMINE (BENADRYL) 25 mg capsule One to two tabs every 6 hours prn allergies  120 capsule  3  . erlotinib ([name]) 150 MG tablet Take 1 tablet (150 mg total) by mouth daily.  30 tablet  2  . Fe Fum-FePoly-Vit C-Vit B3 (INTEGRA) 62.5-62.5-40-3 MG CAPS Take 1 tablet by mouth daily.      . fish oil-omega-3 fatty acids 1000 MG capsule Take 2 g by mouth daily.      Marland Kitchen lamoTRIgine (LAMICTAL) 25 MG tablet Take 1 tablet (25 mg total) by mouth 2 (two) times daily.  180 tablet  1  . Multiple Vitamins-Minerals (MULTIVITAMIN WITH MINERALS) tablet Take 1 tablet by mouth daily.      Marland Kitchen oxycodone (OXY-IR) 5 MG capsule Take 1 capsule (5 mg total) by mouth as directed. 1-2 tabs every 6 hours prn pain  30 capsule  0  . pantoprazole (PROTONIX) 40 MG tablet Take  1 tablet (40 mg total) by mouth daily.  90 tablet  1  . prochlorperazine (COMPAZINE) 10 MG tablet Take 1 tablet (10 mg total) by mouth every 6 (six) hours as needed.  30 tablet  1  . Venlafaxine HCl 150 MG TB24 Take 150 mg by mouth daily.      . Vitamin D, Ergocalciferol, (DRISDOL) 50000 UNITS CAPS Take 1 capsule (50,000 Units total) by mouth every 7 (seven) days. On wednesdays  12 capsule  3    SURGICAL HISTORY:  Past Surgical History  Procedure Date  . Tubal ligation 2002  . Thoracentesis 09/02/11, 09/09/11    right-side pleural effusion    REVIEW OF SYSTEMS:  A comprehensive review of systems was negative except for: Constitutional: positive for fatigue Respiratory: positive  for dyspnea on exertion Musculoskeletal: positive for back pain   PHYSICAL EXAMINATION: General appearance: alert, cooperative and no distress Head: Normocephalic, without obvious abnormality, atraumatic Neck: no adenopathy Lymph nodes: Cervical, supraclavicular, and axillary nodes normal. Resp: clear to auscultation bilaterally Cardio: regular rate and rhythm, S1, S2 normal, no murmur, click, rub or gallop GI: soft, non-tender; bowel sounds normal; no masses,  no organomegaly Extremities: extremities normal, atraumatic, no cyanosis or edema Neurologic: Alert and oriented X 3, normal strength and tone. Normal symmetric reflexes. Normal coordination and gait  ECOG PERFORMANCE STATUS: 1 - Symptomatic but completely ambulatory  There were no vitals taken for this visit.  LABORATORY DATA: Lab Results  Component Value Date   WBC 6.5 04/25/2012   HGB 11.4* 04/25/2012   HCT 33.9* 04/25/2012   MCV 89.1 04/25/2012   PLT 295 04/25/2012      Chemistry      Component Value Date/Time   NA 139 04/25/2012 1155   NA 137 10/26/2011 1616   K 4.5 04/25/2012 1155   K 3.2* 10/26/2011 1616   CL 100 04/25/2012 1155   CL 97 10/26/2011 1616   CO2 27 04/25/2012 1155   CO2 31 10/26/2011 1616   BUN 13.7 04/25/2012 1155   BUN 9 10/26/2011 1616   CREATININE 0.8 04/25/2012 1155   CREATININE 0.61 10/26/2011 1616      Component Value Date/Time   CALCIUM 9.2 04/25/2012 1155   CALCIUM 9.3 10/26/2011 1616   ALKPHOS 135 04/25/2012 1155   ALKPHOS 124* 10/26/2011 1616   AST 25 04/25/2012 1155   AST 32 10/26/2011 1616   ALT 15 04/25/2012 1155   ALT 19 10/26/2011 1616   BILITOT 0.87 04/25/2012 1155   BILITOT 1.0 10/26/2011 1616       RADIOGRAPHIC STUDIES: Ct Chest W Contrast  04/25/2012  *RADIOLOGY REPORT*  Clinical Data:  Lung cancer.  Shortness of breath.  CT CHEST, ABDOMEN AND PELVIS WITH CONTRAST  Technique:  Multidetector CT imaging of the chest, abdomen and pelvis was performed following the standard protocol during bolus administration of  intravenous contrast.  Contrast: OMNIPAQUE IOHEXOL 300 MG/ML  SOLN  Comparison:  Multiple exams, including 01/11/2012   CT CHEST  Findings:  Right infrahilar mass measures 4.3 x 2.5 cm on image 33 of series 2 (formerly 5.8 x 3.0 cm).  Reduced size of the right pleural effusion.  Extensive right pleural soft tissue rind is again observed.  Obvious extension of this process in the ribs is not seen.  Anterior herniation the right hemidiaphragm noted containing primarily colon.  Calcification noted along the left side the mitral valve.  Improved aeration noted in the right lung compared the prior exam. Patchy atelectasis  in the remaining aerated part the right lung does still persist along with airway thickening.  No compelling findings of metastatic disease to the left hemithorax.  Geographic osseous sclerosis noted in the axial and appendicular skeleton, similar distribution to prior.  IMPRESSION:  1.  Reduced size of right infrahilar mass, with improved aeration in the remaining right lung. 2.  Continued thick rind of enhancing pleural tissue on the right. The amount of associated pleural fluid has reduced. 3.  Anterior herniation of the right hemidiaphragm, containing colon. 4.  Similar appearance and distribution of diffuse sclerotic osseous metastatic disease.   CT ABDOMEN AND PELVIS  Findings:  Mild diffuse hepatic steatosis noted.  Spleen and pancreas appear unremarkable.  The gallbladder is somewhat contracted but otherwise unremarkable.  No discrete adrenal mass.  Stable appearance of the kidneys without renal mass noted. No pathologic retroperitoneal or porta hepatis adenopathy is identified.  No pathologic pelvic adenopathy is identified.  Urinary bladder unremarkable. The uterus and adnexa appear unremarkable.  No dilated bowel noted.  Diffuse sclerotic metastatic disease, similar in distribution of prior.  IMPRESSION:  1.  Diffuse osseous metastatic disease in the lumbar spine, pelvis, and hips.   This is similar to prior.  No non-osseous metastatic disease to the abdomen or pelvis identified.   Original Report Authenticated By: Gaylyn Rong, M.D.    ASSESSMENT: This is a very pleasant 52 years old African American female with metastatic non-small cell lung cancer, adenocarcinoma with positive EGFR mutation in exon 19 currently on treatment with [name] status post 6 months. The patient is doing fine and she continues to have improvement in her disease.  PLAN: I discussed the scan results with the patient today. I recommended for her to continue treatment with [name] with the same dose. For pain management I gave her refill of OxyIR 5 mg by mouth every 6 hours as needed. She would come back for followup visit in one month for reevaluation and management any adverse effect of her treatment. For the bone metastasis, the patient will continue on Xgeva on monthly basis. She was advised to call immediately if she has any concerning symptoms in the interval.  All questions were answered. The patient knows to call the clinic with any problems, questions or concerns. We can certainly see the patient much sooner if necessary.  I spent 15 minutes counseling the patient face to face. The total time spent in the appointment was 25 minutes.

## 2012-04-25 NOTE — Patient Instructions (Signed)
The recent scan showed continuous improvement in her disease. Continue treatment with [name]. Followup in one month.

## 2012-04-25 NOTE — Progress Notes (Signed)
Spoke with pt at [facility] regarding treatment and scans.  Questions and concerns addressed

## 2012-05-12 ENCOUNTER — Other Ambulatory Visit: Payer: 59 | Admitting: Lab

## 2012-05-12 ENCOUNTER — Ambulatory Visit: Payer: 59

## 2012-05-18 ENCOUNTER — Ambulatory Visit: Payer: 59

## 2012-05-18 ENCOUNTER — Telehealth: Payer: Self-pay | Admitting: *Deleted

## 2012-05-18 ENCOUNTER — Other Ambulatory Visit: Payer: 59 | Admitting: Lab

## 2012-05-18 NOTE — Telephone Encounter (Signed)
Pt called stating she thinks she needs an antibiotic.  She denies fever, chills.  Has a cough, that is productive with yellow sputum, has a runny nose, and states she has lost her voice.  She states she did have the flu shot this past fall.  Per dr Donnald Garre,  We would not give any antibiotics at this time, will continue to monitor and is to call if pt develops fever 100.5 or greater or has chills.  She verbalized understanding.  SLJ

## 2012-05-23 ENCOUNTER — Ambulatory Visit (HOSPITAL_BASED_OUTPATIENT_CLINIC_OR_DEPARTMENT_OTHER): Payer: BC Managed Care – PPO

## 2012-05-23 ENCOUNTER — Ambulatory Visit: Payer: Self-pay

## 2012-05-23 ENCOUNTER — Other Ambulatory Visit (HOSPITAL_BASED_OUTPATIENT_CLINIC_OR_DEPARTMENT_OTHER): Payer: BC Managed Care – PPO | Admitting: Lab

## 2012-05-23 ENCOUNTER — Ambulatory Visit: Payer: BC Managed Care – PPO | Admitting: Physician Assistant

## 2012-05-23 ENCOUNTER — Telehealth: Payer: Self-pay | Admitting: Internal Medicine

## 2012-05-23 ENCOUNTER — Encounter: Payer: Self-pay | Admitting: Physician Assistant

## 2012-05-23 ENCOUNTER — Other Ambulatory Visit: Payer: Self-pay | Admitting: Lab

## 2012-05-23 ENCOUNTER — Ambulatory Visit (HOSPITAL_BASED_OUTPATIENT_CLINIC_OR_DEPARTMENT_OTHER): Payer: BC Managed Care – PPO | Admitting: Physician Assistant

## 2012-05-23 VITALS — BP 139/98 | HR 98 | Temp 97.2°F | Resp 18 | Ht 66.0 in | Wt 237.8 lb

## 2012-05-23 DIAGNOSIS — C3491 Malignant neoplasm of unspecified part of right bronchus or lung: Secondary | ICD-10-CM

## 2012-05-23 DIAGNOSIS — C7951 Secondary malignant neoplasm of bone: Secondary | ICD-10-CM

## 2012-05-23 DIAGNOSIS — C3490 Malignant neoplasm of unspecified part of unspecified bronchus or lung: Secondary | ICD-10-CM

## 2012-05-23 LAB — CBC WITH DIFFERENTIAL/PLATELET
Basophils Absolute: 0 10*3/uL (ref 0.0–0.1)
EOS%: 1.3 % (ref 0.0–7.0)
HCT: 35.1 % (ref 34.8–46.6)
HGB: 11.2 g/dL — ABNORMAL LOW (ref 11.6–15.9)
LYMPH%: 26.6 % (ref 14.0–49.7)
MCH: 29.6 pg (ref 25.1–34.0)
MCV: 92.6 fL (ref 79.5–101.0)
MONO%: 10.2 % (ref 0.0–14.0)
NEUT%: 61.7 % (ref 38.4–76.8)
Platelets: 350 10*3/uL (ref 145–400)

## 2012-05-23 LAB — COMPREHENSIVE METABOLIC PANEL
AST: 15 U/L (ref 0–37)
Alkaline Phosphatase: 113 U/L (ref 39–117)
BUN: 16 mg/dL (ref 6–23)
Creatinine, Ser: 0.74 mg/dL (ref 0.50–1.10)
Total Bilirubin: 0.6 mg/dL (ref 0.3–1.2)

## 2012-05-23 MED ORDER — OXYCODONE HCL 5 MG PO CAPS: ORAL_CAPSULE | ORAL | Status: DC

## 2012-05-23 MED ORDER — DENOSUMAB 120 MG/1.7ML ~~LOC~~ SOLN
120.0000 mg | Freq: Once | SUBCUTANEOUS | Status: AC
  Administered 2012-05-23: 120 mg via SUBCUTANEOUS
  Filled 2012-05-23: qty 1.7

## 2012-05-23 NOTE — Patient Instructions (Addendum)
Continue taking [name] 150 mg by mouth daily Followup in one month for another symptom management visit as well as your next Xgeva injection

## 2012-05-25 ENCOUNTER — Ambulatory Visit: Payer: BC Managed Care – PPO | Admitting: Physician Assistant

## 2012-05-25 ENCOUNTER — Other Ambulatory Visit: Payer: Self-pay | Admitting: Lab

## 2012-05-25 ENCOUNTER — Telehealth: Payer: Self-pay | Admitting: Internal Medicine

## 2012-05-26 NOTE — Progress Notes (Signed)
[facility] [phone]   [phone]  OFFICE PROGRESS NOTE  [name], MD [address] [place] [address]  DIAGNOSIS: Metastatic non-small cell lung cancer, adenocarcinoma with positive EGFR mutation in exon 19 diagnosed in June of 2013   PRIOR THERAPY: palliative radiotherapy to the right lung under the care of Dr. Dayton Scrape expected to be completed on 10/29/2011.   CURRENT THERAPY:  1. [name] 150 mg by mouth daily started 10/11/2011. Status post 7 months of therapy 2. Xgeva 120 mg subcutaneously every 4 weeks  INTERVAL HISTORY: Beth [name] 52 y.o. female returns to the clinic today for routine followup visit. The patient is feeling fine today with no specific complaints except for mild pain at the right chest with radiation to the back. She is currently on OxyIR on as-needed basis. She requests a refill for her OxyIR tablets. She denied having any significant chest pain. She does report some cold symptoms with some loss of voice however the symptoms have resolved. She denied any fever or chills. She reports that her skin rash has improved and she's not had any recent diarrhea related to her treatment with [name].  She denied having any significant weight loss or night sweats. She has mild skin rash and few episodes of diarrhea. She is tolerating her treatment with [name] fairly well with no significant adverse effects.   MEDICAL HISTORY: Past Medical History  Diagnosis Date  . Asthma   . GERD (gastroesophageal reflux disease)   . Hypertension   . Heart murmur   . Fibromyalgia   . Arthritis of knee, degenerative   . Endocarditis     as teenager  . Complication of anesthesia     difficulty waking up  . Shortness of breath   . Lung mass     R- ADENOCARCINOMA  . Sleep apnea     no longer using CPAP  . Pleural effusion 08/30/11  . 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 Anxiety   . Depression   . History of  radiation therapy 09/29/11-11/04/2011    right lung 2700cGy 15 sessions    ALLERGIES:  is allergic to meloxicam.  MEDICATIONS:  Current Outpatient Prescriptions  Medication Sig Dispense Refill  . ARIPiprazole (ABILIFY) 10 MG tablet Take 1 tablet (10 mg total) by mouth daily.  90 tablet  1  . chlorpheniramine-HYDROcodone (TUSSIONEX) 10-8 MG/5ML LQCR TAKE (1 TEASPOONFUL) BY MOUTH EVERY 12 HOURS AS NEEDED  140 mL  0  . clindamycin (CLEOCIN T) 1 % external solution APPLY TOPICALLY TWICE A DAY  240 mL  3  . diphenhydrAMINE (BENADRYL) 25 mg capsule One to two tabs every 6 hours prn allergies  120 capsule  3  . erlotinib ([name]) 150 MG tablet Take 1 tablet (150 mg total) by mouth daily.  30 tablet  2  . Fe Fum-FePoly-Vit C-Vit B3 (INTEGRA) 62.5-62.5-40-3 MG CAPS Take 1 tablet by mouth daily.      . fish oil-omega-3 fatty acids 1000 MG capsule Take 2 g by mouth daily.      Marland Kitchen lamoTRIgine (LAMICTAL) 25 MG tablet Take 1 tablet (25 mg total) by mouth 2 (two) times daily.  180 tablet  1  . Multiple Vitamins-Minerals (MULTIVITAMIN WITH MINERALS) tablet Take 1 tablet by mouth daily.      Marland Kitchen oxycodone (OXY-IR) 5 MG capsule 1 tab every 6 hours prn pain  60 capsule  0  . pantoprazole (PROTONIX) 40  MG tablet Take 1 tablet (40 mg total) by mouth daily.  90 tablet  1  . prochlorperazine (COMPAZINE) 10 MG tablet Take 1 tablet (10 mg total) by mouth every 6 (six) hours as needed.  30 tablet  1  . ranitidine (ZANTAC) 150 MG tablet Take 150 mg by mouth 2 (two) times daily. Take at least 2 hours apart from [name] medication to avoid Drug interaction      . Venlafaxine HCl 150 MG TB24 Take 150 mg by mouth daily.      . Vitamin D, Ergocalciferol, (DRISDOL) 50000 UNITS CAPS Take 1 capsule (50,000 Units total) by mouth every 7 (seven) days. On wednesdays  12 capsule  3   No current facility-administered medications for this visit.    SURGICAL HISTORY:  Past Surgical History  Procedure Laterality Date  . Tubal  ligation  2002  . Thoracentesis  09/02/11, 09/09/11    right-side pleural effusion    REVIEW OF SYSTEMS:  A comprehensive review of systems was negative except for: Constitutional: positive for fatigue Musculoskeletal: positive for back pain   PHYSICAL EXAMINATION: General appearance: alert, cooperative and no distress Head: Normocephalic, without obvious abnormality, atraumatic Neck: no adenopathy Lymph nodes: Cervical, supraclavicular, and axillary nodes normal. Resp: clear to auscultation bilaterally Cardio: regular rate and rhythm, S1, S2 normal, no murmur, click, rub or gallop GI: soft, non-tender; bowel sounds normal; no masses,  no organomegaly Extremities: extremities normal, atraumatic, no cyanosis or edema Neurologic: Alert and oriented X 3, normal strength and tone. Normal symmetric reflexes. Normal coordination and gait  ECOG PERFORMANCE STATUS: 1 - Symptomatic but completely ambulatory  Blood pressure 139/98, pulse 98, temperature 97.2 F (36.2 C), temperature source Oral, resp. rate 18, height 5\' 6"  (1.676 m), weight 237 lb 12.8 oz (107.865 kg).  LABORATORY DATA: Lab Results  Component Value Date   WBC 6.1 05/23/2012   HGB 11.2* 05/23/2012   HCT 35.1 05/23/2012   MCV 92.6 05/23/2012   PLT 350 05/23/2012      Chemistry      Component Value Date/Time   NA 139 05/23/2012 1305   NA 139 04/25/2012 1155   K 4.0 05/23/2012 1305   K 4.5 04/25/2012 1155   CL 100 05/23/2012 1305   CL 100 04/25/2012 1155   CO2 30 05/23/2012 1305   CO2 27 04/25/2012 1155   BUN 16 05/23/2012 1305   BUN 13.7 04/25/2012 1155   CREATININE 0.74 05/23/2012 1305   CREATININE 0.8 04/25/2012 1155      Component Value Date/Time   CALCIUM 9.0 05/23/2012 1305   CALCIUM 9.2 04/25/2012 1155   ALKPHOS 113 05/23/2012 1305   ALKPHOS 135 04/25/2012 1155   AST 15 05/23/2012 1305   AST 25 04/25/2012 1155   ALT 7 05/23/2012 1305   ALT 15 04/25/2012 1155   BILITOT 0.6 05/23/2012 1305   BILITOT 0.87 04/25/2012 1155       RADIOGRAPHIC  STUDIES: Ct Chest W Contrast  04/25/2012  *RADIOLOGY REPORT*  Clinical Data:  Lung cancer.  Shortness of breath.  CT CHEST, ABDOMEN AND PELVIS WITH CONTRAST  Technique:  Multidetector CT imaging of the chest, abdomen and pelvis was performed following the standard protocol during bolus administration of intravenous contrast.  Contrast: OMNIPAQUE IOHEXOL 300 MG/ML  SOLN  Comparison:  Multiple exams, including 01/11/2012   CT CHEST  Findings:  Right infrahilar mass measures 4.3 x 2.5 cm on image 33 of series 2 (formerly 5.8 x 3.0 cm).  Reduced  size of the right pleural effusion.  Extensive right pleural soft tissue rind is again observed.  Obvious extension of this process in the ribs is not seen.  Anterior herniation the right hemidiaphragm noted containing primarily colon.  Calcification noted along the left side the mitral valve.  Improved aeration noted in the right lung compared the prior exam. Patchy atelectasis in the remaining aerated part the right lung does still persist along with airway thickening.  No compelling findings of metastatic disease to the left hemithorax.  Geographic osseous sclerosis noted in the axial and appendicular skeleton, similar distribution to prior.  IMPRESSION:  1.  Reduced size of right infrahilar mass, with improved aeration in the remaining right lung. 2.  Continued thick rind of enhancing pleural tissue on the right. The amount of associated pleural fluid has reduced. 3.  Anterior herniation of the right hemidiaphragm, containing colon. 4.  Similar appearance and distribution of diffuse sclerotic osseous metastatic disease.   CT ABDOMEN AND PELVIS  Findings:  Mild diffuse hepatic steatosis noted.  Spleen and pancreas appear unremarkable.  The gallbladder is somewhat contracted but otherwise unremarkable.  No discrete adrenal mass.  Stable appearance of the kidneys without renal mass noted. No pathologic retroperitoneal or porta hepatis adenopathy is identified.  No  pathologic pelvic adenopathy is identified.  Urinary bladder unremarkable. The uterus and adnexa appear unremarkable.  No dilated bowel noted.  Diffuse sclerotic metastatic disease, similar in distribution of prior.  IMPRESSION:  1.  Diffuse osseous metastatic disease in the lumbar spine, pelvis, and hips.  This is similar to prior.  No non-osseous metastatic disease to the abdomen or pelvis identified.   Original Report Authenticated By: Gaylyn Rong, M.D.    ASSESSMENT/PLAN: This is a very pleasant 52 years old African American female with metastatic non-small cell lung cancer, adenocarcinoma with positive EGFR mutation in exon 19 currently on treatment with [name] status post 7 months. The patient is doing fine and she continues to have improvement in her disease. The patient was discussed with Dr. Arbutus Ped. She will continue on [name] at 150 mg by mouth daily. She is given a refill for her OxyIR tablets. She'll return in one month for another symptom management visit with a another CBC differential and C. met. She'll receive her Xgeva injection today as scheduled.  Laural Benes, ADRENA E, PA-C   For the bone metastasis, the patient will continue on Xgeva on monthly basis. She was advised to call immediately if she has any concerning symptoms in the interval.  All questions were answered. The patient knows to call the clinic with any problems, questions or concerns. We can certainly see the patient much sooner if necessary.  I spent 20 minutes counseling the patient face to face. The total time spent in the appointment was 30 minutes.

## 2012-06-09 ENCOUNTER — Telehealth: Payer: Self-pay | Admitting: Internal Medicine

## 2012-06-09 ENCOUNTER — Other Ambulatory Visit: Payer: 59 | Admitting: Lab

## 2012-06-09 ENCOUNTER — Ambulatory Visit: Payer: 59

## 2012-06-15 ENCOUNTER — Other Ambulatory Visit: Payer: 59 | Admitting: Lab

## 2012-06-15 ENCOUNTER — Ambulatory Visit: Payer: 59

## 2012-06-20 ENCOUNTER — Ambulatory Visit: Payer: Self-pay

## 2012-06-20 ENCOUNTER — Ambulatory Visit (HOSPITAL_BASED_OUTPATIENT_CLINIC_OR_DEPARTMENT_OTHER): Payer: BC Managed Care – PPO

## 2012-06-20 ENCOUNTER — Encounter: Payer: Self-pay | Admitting: Physician Assistant

## 2012-06-20 ENCOUNTER — Other Ambulatory Visit: Payer: Self-pay | Admitting: Lab

## 2012-06-20 ENCOUNTER — Other Ambulatory Visit (HOSPITAL_BASED_OUTPATIENT_CLINIC_OR_DEPARTMENT_OTHER): Payer: BC Managed Care – PPO | Admitting: Lab

## 2012-06-20 ENCOUNTER — Ambulatory Visit (HOSPITAL_BASED_OUTPATIENT_CLINIC_OR_DEPARTMENT_OTHER): Payer: BC Managed Care – PPO | Admitting: Physician Assistant

## 2012-06-20 ENCOUNTER — Other Ambulatory Visit: Payer: Self-pay | Admitting: Family Medicine

## 2012-06-20 DIAGNOSIS — C7952 Secondary malignant neoplasm of bone marrow: Secondary | ICD-10-CM

## 2012-06-20 DIAGNOSIS — C3400 Malignant neoplasm of unspecified main bronchus: Secondary | ICD-10-CM

## 2012-06-20 DIAGNOSIS — C3491 Malignant neoplasm of unspecified part of right bronchus or lung: Secondary | ICD-10-CM

## 2012-06-20 DIAGNOSIS — C7951 Secondary malignant neoplasm of bone: Secondary | ICD-10-CM

## 2012-06-20 DIAGNOSIS — C3490 Malignant neoplasm of unspecified part of unspecified bronchus or lung: Secondary | ICD-10-CM

## 2012-06-20 LAB — CBC WITH DIFFERENTIAL/PLATELET
Basophils Absolute: 0 10*3/uL (ref 0.0–0.1)
EOS%: 1.8 % (ref 0.0–7.0)
HCT: 33.7 % — ABNORMAL LOW (ref 34.8–46.6)
HGB: 11.2 g/dL — ABNORMAL LOW (ref 11.6–15.9)
LYMPH%: 27 % (ref 14.0–49.7)
MCH: 30.8 pg (ref 25.1–34.0)
MCV: 92.4 fL (ref 79.5–101.0)
MONO%: 10 % (ref 0.0–14.0)
NEUT%: 60.7 % (ref 38.4–76.8)
Platelets: 294 10*3/uL (ref 145–400)
lymph#: 1.4 10*3/uL (ref 0.9–3.3)

## 2012-06-20 LAB — COMPREHENSIVE METABOLIC PANEL (CC13)
AST: 17 U/L (ref 5–34)
BUN: 17.3 mg/dL (ref 7.0–26.0)
Calcium: 9.1 mg/dL (ref 8.4–10.4)
Chloride: 102 mEq/L (ref 98–107)
Creatinine: 0.8 mg/dL (ref 0.6–1.1)
Total Bilirubin: 0.89 mg/dL (ref 0.20–1.20)

## 2012-06-20 MED ORDER — OXYCODONE HCL 5 MG PO CAPS: ORAL_CAPSULE | ORAL | Status: DC

## 2012-06-20 MED ORDER — DENOSUMAB 120 MG/1.7ML ~~LOC~~ SOLN
120.0000 mg | Freq: Once | SUBCUTANEOUS | Status: AC
  Administered 2012-06-20: 120 mg via SUBCUTANEOUS
  Filled 2012-06-20: qty 1.7

## 2012-06-20 MED ORDER — ALBUTEROL SULFATE (2.5 MG/3ML) 0.083% IN NEBU
2.5000 mg | INHALATION_SOLUTION | Freq: Once | RESPIRATORY_TRACT | Status: DC | PRN
  Filled 2012-06-20: qty 3

## 2012-06-20 NOTE — Patient Instructions (Addendum)
Continue [name] 150 mg by mouth daily Followup with Dr. Arbutus Ped in one month with a restaging CT scan of your chest, abdomen and pelvis to reevaluate her disease Continue with your monthly Xgeva injections as scheduled

## 2012-06-20 NOTE — Progress Notes (Signed)
[facility] [phone]   Fax:([phone]  OFFICE PROGRESS NOTE  [name], MD [address] [place] [address]  DIAGNOSIS: Metastatic non-small cell lung cancer, adenocarcinoma with positive EGFR mutation in exon 19 diagnosed in June of 2013   PRIOR THERAPY: palliative radiotherapy to the right lung under the care of Dr. Dayton Scrape expected to be completed on 10/29/2011.   CURRENT THERAPY:  1. [name] 150 mg by mouth daily started 10/11/2011. Status post 8 months of therapy 2. Xgeva 120 mg subcutaneously every 4 weeks  INTERVAL HISTORY: [place] 52 y.o. female returns to the clinic today for routine followup visit. The patient is feeling fine today with no specific complaints except for mild skin rash pain at the right chest with radiation to the back. She is currently on OxyIR on as-needed basis. She requests a refill for her OxyIR tablets. She denied having any significant chest pain. She's had a mild skin rash where affecting her face and neck and also reports occasional episodes of diarrhea well-controlled with Imodium related to the [name] therapy.   She denied having any significant weight loss or night sweats.  She is tolerating her treatment with [name] fairly well with no significant adverse effects.   MEDICAL HISTORY: Past Medical History  Diagnosis Date  . Asthma   . GERD (gastroesophageal reflux disease)   . Hypertension   . Heart murmur   . Fibromyalgia   . Arthritis of knee, degenerative   . Endocarditis     as teenager  . Complication of anesthesia     difficulty waking up  . Shortness of breath   . Lung mass     R- ADENOCARCINOMA  . Sleep apnea     no longer using CPAP  . Pleural effusion 08/30/11  . 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 Anxiety   . Depression   . History of radiation therapy 09/29/11-11/04/2011    right lung 2700cGy 15 sessions    ALLERGIES:  is allergic to  meloxicam.  MEDICATIONS:  Current Outpatient Prescriptions  Medication Sig Dispense Refill  . ARIPiprazole (ABILIFY) 10 MG tablet Take 1 tablet (10 mg total) by mouth daily.  90 tablet  1  . chlorpheniramine-HYDROcodone (TUSSIONEX) 10-8 MG/5ML LQCR TAKE (1 TEASPOONFUL) BY MOUTH EVERY 12 HOURS AS NEEDED  140 mL  0  . clindamycin (CLEOCIN T) 1 % external solution APPLY TOPICALLY TWICE A DAY  240 mL  3  . diphenhydrAMINE (BENADRYL) 25 mg capsule One to two tabs every 6 hours prn allergies  120 capsule  3  . erlotinib ([name]) 150 MG tablet Take 1 tablet (150 mg total) by mouth daily.  30 tablet  2  . Fe Fum-FePoly-Vit C-Vit B3 (INTEGRA) 62.5-62.5-40-3 MG CAPS Take 1 tablet by mouth daily.      . fish oil-omega-3 fatty acids 1000 MG capsule Take 2 g by mouth daily.      Marland Kitchen lamoTRIgine (LAMICTAL) 25 MG tablet Take 1 tablet (25 mg total) by mouth 2 (two) times daily.  180 tablet  1  . Multiple Vitamins-Minerals (MULTIVITAMIN WITH MINERALS) tablet Take 1 tablet by mouth daily.      Marland Kitchen oxycodone (OXY-IR) 5 MG capsule 1 tab every 6 hours prn pain  60 capsule  0  . pantoprazole (PROTONIX) 40 MG tablet Take 1 tablet (40 mg total) by mouth daily.  90 tablet  1  . prochlorperazine (COMPAZINE) 10 MG  tablet Take 1 tablet (10 mg total) by mouth every 6 (six) hours as needed.  30 tablet  1  . ranitidine (ZANTAC) 150 MG tablet Take 150 mg by mouth 2 (two) times daily. Take at least 2 hours apart from [name] medication to avoid Drug interaction      . Venlafaxine HCl 150 MG TB24 Take 150 mg by mouth daily.      . Vitamin D, Ergocalciferol, (DRISDOL) 50000 UNITS CAPS Take 1 capsule (50,000 Units total) by mouth every 7 (seven) days. On wednesdays  12 capsule  3   No current facility-administered medications for this visit.    SURGICAL HISTORY:  Past Surgical History  Procedure Laterality Date  . Tubal ligation  2002  . Thoracentesis  09/02/11, 09/09/11    right-side pleural effusion    REVIEW OF  SYSTEMS:  A comprehensive review of systems was negative except for: Gastrointestinal: positive for diarrhea Musculoskeletal: positive for back pain Skin rash related to [name] therapy   PHYSICAL EXAMINATION: General appearance: alert, cooperative and no distress Head: Normocephalic, without obvious abnormality, atraumatic Neck: no adenopathy Lymph nodes: Cervical, supraclavicular, and axillary nodes normal. Resp: clear to auscultation bilaterally Cardio: regular rate and rhythm, S1, S2 normal, no murmur, click, rub or gallop GI: soft, non-tender; bowel sounds normal; no masses,  no organomegaly Extremities: extremities normal, atraumatic, no cyanosis or edema Neurologic: Alert and oriented X 3, normal strength and tone. Normal symmetric reflexes. Normal coordination and gait Skin: Reveals scattered acneiform eruptions over the nose cheeks chin and neck, no evidence of superinfection  ECOG PERFORMANCE STATUS: 1 - Symptomatic but completely ambulatory  Blood pressure 127/81, pulse 99, temperature 97.9 F (36.6 C), temperature source Oral, resp. rate 18, height 5\' 6"  (1.676 m), weight 246 lb 6.4 oz (111.766 kg).  LABORATORY DATA: Lab Results  Component Value Date   WBC 5.0 06/20/2012   HGB 11.2* 06/20/2012   HCT 33.7* 06/20/2012   MCV 92.4 06/20/2012   PLT 294 06/20/2012      Chemistry      Component Value Date/Time   NA 139 06/20/2012 1119   NA 139 05/23/2012 1305   K 4.3 06/20/2012 1119   K 4.0 05/23/2012 1305   CL 102 06/20/2012 1119   CL 100 05/23/2012 1305   CO2 29 06/20/2012 1119   CO2 30 05/23/2012 1305   BUN 17.3 06/20/2012 1119   BUN 16 05/23/2012 1305   CREATININE 0.8 06/20/2012 1119   CREATININE 0.74 05/23/2012 1305      Component Value Date/Time   CALCIUM 9.1 06/20/2012 1119   CALCIUM 9.0 05/23/2012 1305   ALKPHOS 101 06/20/2012 1119   ALKPHOS 113 05/23/2012 1305   AST 17 06/20/2012 1119   AST 15 05/23/2012 1305   ALT 12 06/20/2012 1119   ALT 7 05/23/2012 1305   BILITOT 0.89 06/20/2012 1119   BILITOT  0.6 05/23/2012 1305       RADIOGRAPHIC STUDIES: Ct Chest W Contrast  04/25/2012  *RADIOLOGY REPORT*  Clinical Data:  Lung cancer.  Shortness of breath.  CT CHEST, ABDOMEN AND PELVIS WITH CONTRAST  Technique:  Multidetector CT imaging of the chest, abdomen and pelvis was performed following the standard protocol during bolus administration of intravenous contrast.  Contrast: OMNIPAQUE IOHEXOL 300 MG/ML  SOLN  Comparison:  Multiple exams, including 01/11/2012   CT CHEST  Findings:  Right infrahilar mass measures 4.3 x 2.5 cm on image 33 of series 2 (formerly 5.8 x 3.0 cm).  Reduced  size of the right pleural effusion.  Extensive right pleural soft tissue rind is again observed.  Obvious extension of this process in the ribs is not seen.  Anterior herniation the right hemidiaphragm noted containing primarily colon.  Calcification noted along the left side the mitral valve.  Improved aeration noted in the right lung compared the prior exam. Patchy atelectasis in the remaining aerated part the right lung does still persist along with airway thickening.  No compelling findings of metastatic disease to the left hemithorax.  Geographic osseous sclerosis noted in the axial and appendicular skeleton, similar distribution to prior.  IMPRESSION:  1.  Reduced size of right infrahilar mass, with improved aeration in the remaining right lung. 2.  Continued thick rind of enhancing pleural tissue on the right. The amount of associated pleural fluid has reduced. 3.  Anterior herniation of the right hemidiaphragm, containing colon. 4.  Similar appearance and distribution of diffuse sclerotic osseous metastatic disease.   CT ABDOMEN AND PELVIS  Findings:  Mild diffuse hepatic steatosis noted.  Spleen and pancreas appear unremarkable.  The gallbladder is somewhat contracted but otherwise unremarkable.  No discrete adrenal mass.  Stable appearance of the kidneys without renal mass noted. No pathologic retroperitoneal or porta  hepatis adenopathy is identified.  No pathologic pelvic adenopathy is identified.  Urinary bladder unremarkable. The uterus and adnexa appear unremarkable.  No dilated bowel noted.  Diffuse sclerotic metastatic disease, similar in distribution of prior.  IMPRESSION:  1.  Diffuse osseous metastatic disease in the lumbar spine, pelvis, and hips.  This is similar to prior.  No non-osseous metastatic disease to the abdomen or pelvis identified.   Original Report Authenticated By: Gaylyn Rong, M.D.    ASSESSMENT/PLAN: This is a very pleasant 52 years old African American female with metastatic non-small cell lung cancer, adenocarcinoma with positive EGFR mutation in exon 19 currently on treatment with [name] status post 8 months. The patient is doing fine and she continues to have improvement in her disease. The patient was discussed with Dr. Arbutus Ped. She will continue on [name] at 150 mg by mouth daily. She is given a refill for her OxyIR tablets. She'll followup with Dr. Arbutus Ped in one month for another symptom management visit with a another CBC differential and C. met as well as a restaging CT scan of her chest, abdomen and pelvis with contrast to reevaluate her disease.. She'll receive her Xgeva injection today as scheduled.  Laural Benes, Evadna Donaghy E, PA-C   For the bone metastasis, the patient will continue on Xgeva on monthly basis. She was advised to call immediately if she has any concerning symptoms in the interval.  All questions were answered. The patient knows to call the clinic with any problems, questions or concerns. We can certainly see the patient much sooner if necessary.  I spent 20 minutes counseling the patient face to face. The total time spent in the appointment was 30 minutes.

## 2012-06-21 ENCOUNTER — Telehealth: Payer: Self-pay | Admitting: *Deleted

## 2012-06-21 NOTE — Telephone Encounter (Signed)
Lm gv appt d/t for the pt to come in on 07/18/12. Also made pt aware that i will mail a letter/cal as well.

## 2012-06-28 ENCOUNTER — Other Ambulatory Visit: Payer: Self-pay | Admitting: *Deleted

## 2012-06-28 DIAGNOSIS — C3490 Malignant neoplasm of unspecified part of unspecified bronchus or lung: Secondary | ICD-10-CM

## 2012-06-28 MED ORDER — ERLOTINIB HCL 150 MG PO TABS: 150.0000 mg | ORAL_TABLET | Freq: Every day | ORAL | Status: DC

## 2012-07-14 ENCOUNTER — Ambulatory Visit (HOSPITAL_COMMUNITY): Payer: BC Managed Care – PPO

## 2012-07-17 ENCOUNTER — Ambulatory Visit (HOSPITAL_COMMUNITY): Payer: BC Managed Care – PPO

## 2012-07-17 ENCOUNTER — Telehealth: Payer: Self-pay | Admitting: Internal Medicine

## 2012-07-17 ENCOUNTER — Telehealth: Payer: Self-pay | Admitting: Medical Oncology

## 2012-07-17 NOTE — Telephone Encounter (Signed)
transferred call to [facility] about r/s appt .

## 2012-07-17 NOTE — Telephone Encounter (Signed)
pt called to r/s appt....Done °

## 2012-07-18 ENCOUNTER — Other Ambulatory Visit: Payer: BC Managed Care – PPO | Admitting: Lab

## 2012-07-18 ENCOUNTER — Ambulatory Visit: Payer: BC Managed Care – PPO | Admitting: Internal Medicine

## 2012-07-18 ENCOUNTER — Ambulatory Visit: Payer: BC Managed Care – PPO

## 2012-07-20 ENCOUNTER — Ambulatory Visit: Payer: BC Managed Care – PPO | Admitting: Internal Medicine

## 2012-07-21 ENCOUNTER — Other Ambulatory Visit: Payer: Self-pay | Admitting: *Deleted

## 2012-07-21 MED ORDER — OXYCODONE HCL 5 MG PO CAPS: ORAL_CAPSULE | ORAL | Status: DC

## 2012-07-24 ENCOUNTER — Ambulatory Visit (HOSPITAL_BASED_OUTPATIENT_CLINIC_OR_DEPARTMENT_OTHER): Payer: BC Managed Care – PPO | Admitting: Internal Medicine

## 2012-07-24 ENCOUNTER — Ambulatory Visit: Payer: BC Managed Care – PPO

## 2012-07-24 ENCOUNTER — Other Ambulatory Visit (HOSPITAL_BASED_OUTPATIENT_CLINIC_OR_DEPARTMENT_OTHER): Payer: BC Managed Care – PPO | Admitting: Lab

## 2012-07-24 ENCOUNTER — Encounter: Payer: Self-pay | Admitting: Internal Medicine

## 2012-07-24 ENCOUNTER — Ambulatory Visit (HOSPITAL_COMMUNITY)
Admission: RE | Admit: 2012-07-24 | Discharge: 2012-07-24 | Disposition: A | Discharge status: Home / Self Care | Payer: BC Managed Care – PPO | Source: Ambulatory Visit | Attending: Physician Assistant | Admitting: Physician Assistant

## 2012-07-24 ENCOUNTER — Telehealth: Payer: Self-pay | Admitting: Internal Medicine

## 2012-07-24 VITALS — BP 124/89 | HR 95 | Temp 98.2°F | Resp 18 | Ht 66.0 in | Wt 251.8 lb

## 2012-07-24 DIAGNOSIS — I080 Rheumatic disorders of both mitral and aortic valves: Secondary | ICD-10-CM | POA: Insufficient documentation

## 2012-07-24 DIAGNOSIS — C7952 Secondary malignant neoplasm of bone marrow: Secondary | ICD-10-CM | POA: Insufficient documentation

## 2012-07-24 DIAGNOSIS — C7951 Secondary malignant neoplasm of bone: Secondary | ICD-10-CM

## 2012-07-24 DIAGNOSIS — Z923 Personal history of irradiation: Secondary | ICD-10-CM | POA: Insufficient documentation

## 2012-07-24 DIAGNOSIS — C3491 Malignant neoplasm of unspecified part of right bronchus or lung: Secondary | ICD-10-CM

## 2012-07-24 DIAGNOSIS — C3400 Malignant neoplasm of unspecified main bronchus: Secondary | ICD-10-CM

## 2012-07-24 DIAGNOSIS — K449 Diaphragmatic hernia without obstruction or gangrene: Secondary | ICD-10-CM | POA: Insufficient documentation

## 2012-07-24 DIAGNOSIS — Z79899 Other long term (current) drug therapy: Secondary | ICD-10-CM | POA: Insufficient documentation

## 2012-07-24 DIAGNOSIS — J90 Pleural effusion, not elsewhere classified: Secondary | ICD-10-CM | POA: Insufficient documentation

## 2012-07-24 DIAGNOSIS — R197 Diarrhea, unspecified: Secondary | ICD-10-CM

## 2012-07-24 DIAGNOSIS — I517 Cardiomegaly: Secondary | ICD-10-CM | POA: Insufficient documentation

## 2012-07-24 DIAGNOSIS — R21 Rash and other nonspecific skin eruption: Secondary | ICD-10-CM

## 2012-07-24 DIAGNOSIS — C3490 Malignant neoplasm of unspecified part of unspecified bronchus or lung: Secondary | ICD-10-CM | POA: Insufficient documentation

## 2012-07-24 LAB — COMPREHENSIVE METABOLIC PANEL (CC13)
ALT: 14 U/L (ref 0–55)
AST: 23 U/L (ref 5–34)
CO2: 27 mEq/L (ref 22–29)
Calcium: 9.1 mg/dL (ref 8.4–10.4)
Chloride: 100 mEq/L (ref 98–107)
Creatinine: 0.8 mg/dL (ref 0.6–1.1)
Sodium: 138 mEq/L (ref 136–145)
Total Protein: 8 g/dL (ref 6.4–8.3)

## 2012-07-24 LAB — CBC WITH DIFFERENTIAL/PLATELET
BASO%: 0.7 % (ref 0.0–2.0)
EOS%: 2.1 % (ref 0.0–7.0)
HCT: 36.5 % (ref 34.8–46.6)
MCH: 30.4 pg (ref 25.1–34.0)
MCHC: 32.9 g/dL (ref 31.5–36.0)
MONO#: 0.4 10*3/uL (ref 0.1–0.9)
NEUT%: 59.2 % (ref 38.4–76.8)
RBC: 3.94 10*6/uL (ref 3.70–5.45)
RDW: 15.7 % — ABNORMAL HIGH (ref 11.2–14.5)
WBC: 5.3 10*3/uL (ref 3.9–10.3)
lymph#: 1.6 10*3/uL (ref 0.9–3.3)

## 2012-07-24 MED ORDER — IOHEXOL 300 MG/ML  SOLN
100.0000 mL | Freq: Once | INTRAMUSCULAR | Status: AC | PRN
  Administered 2012-07-24: 100 mL via INTRAVENOUS

## 2012-07-24 NOTE — Progress Notes (Signed)
Patient early for appointment.  She was sitting in the hall outside of the injection room.  She was expecting to be seen immediately.  When told to have a seat out in the lobby because I had another patient that was scheduled before her, she got upset.  She said "I don't like your attitude and I just don't want the shots."  She got up and left.

## 2012-07-24 NOTE — Progress Notes (Signed)
[facility] Telephone:(336) [phone]   Fax:(336) [phone]  OFFICE PROGRESS NOTE  [name], MD [address] [place] [address]  DIAGNOSIS: Metastatic non-small cell lung cancer, adenocarcinoma with positive EGFR mutation in exon 19 diagnosed in June of 2013   PRIOR THERAPY: palliative radiotherapy to the right lung under the care of Dr. Dayton Scrape expected to be completed on 10/29/2011.   CURRENT THERAPY:  1. [name] 150 mg by mouth daily started 10/11/2011. Status post 9 months of therapy 2. Xgeva 120 mg subcutaneously every 4 weeks  INTERVAL HISTORY: Beth [name] 52 y.o. female returns to the clinic today for followup visit. The patient is feeling fine today with no specific complaints except for mild skin rash mainly on the face. She denied having any significant weight loss or night sweats. She has occasional diarrhea that happened 2 times per day every few days. She denied having any significant chest pain, shortness breath, cough or hemoptysis. She is tolerating her treatment with [name] except for the mild skin rash and occasional diarrhea. She denied having any significant nausea or vomiting, no fever or chills. She has repeat CT of the chest, abdomen and pelvis performed earlier today and she is here for evaluation and discussion of her scan results.  MEDICAL HISTORY: Past Medical History  Diagnosis Date  . Asthma   . GERD (gastroesophageal reflux disease)   . Hypertension   . Heart murmur   . Fibromyalgia   . Arthritis of knee, degenerative   . Endocarditis     as teenager  . Complication of anesthesia     difficulty waking up  . Shortness of breath   . Lung mass     R- ADENOCARCINOMA  . Sleep apnea     no longer using CPAP  . Pleural effusion 08/30/11  . 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 Anxiety   . Depression   . History of radiation therapy 09/29/11-11/04/2011    right lung 2700cGy 15 sessions    ALLERGIES:   is allergic to meloxicam.  MEDICATIONS:  Current Outpatient Prescriptions  Medication Sig Dispense Refill  . ARIPiprazole (ABILIFY) 10 MG tablet Take 1 tablet (10 mg total) by mouth daily.  90 tablet  1  . chlorpheniramine-HYDROcodone (TUSSIONEX) 10-8 MG/5ML LQCR TAKE (1 TEASPOONFUL) BY MOUTH EVERY 12 HOURS AS NEEDED  140 mL  0  . clindamycin (CLEOCIN T) 1 % external solution APPLY TOPICALLY TWICE A DAY  240 mL  3  . cyclobenzaprine (FLEXERIL) 5 MG tablet TAKE 1 TABLET BY MOUTH ONCE DAILY AS NEEDED FOR MUSCLE SPASMS  30 tablet  1  . diphenhydrAMINE (BENADRYL) 25 mg capsule One to two tabs every 6 hours prn allergies  120 capsule  3  . erlotinib ([name]) 150 MG tablet Take 1 tablet (150 mg total) by mouth daily.  30 tablet  2  . Fe Fum-FePoly-Vit C-Vit B3 (INTEGRA) 62.5-62.5-40-3 MG CAPS Take 1 tablet by mouth daily.      . fish oil-omega-3 fatty acids 1000 MG capsule Take 2 g by mouth daily.      Marland Kitchen lamoTRIgine (LAMICTAL) 25 MG tablet Take 1 tablet (25 mg total) by mouth 2 (two) times daily.  180 tablet  1  . Multiple Vitamins-Minerals (MULTIVITAMIN WITH MINERALS) tablet Take 1 tablet by mouth daily.      Marland Kitchen oxycodone (OXY-IR) 5 MG capsule 1 tab every 6 hours as needed for pain  60 capsule  0  . pantoprazole (PROTONIX) 40 MG tablet Take 1 tablet (40 mg total) by mouth daily.  90 tablet  1  . ranitidine (ZANTAC) 150 MG tablet Take 150 mg by mouth 2 (two) times daily. Take at least 2 hours apart from [name] medication to avoid Drug interaction      . Venlafaxine HCl 150 MG TB24 Take 150 mg by mouth daily.      . Vitamin D, Ergocalciferol, (DRISDOL) 50000 UNITS CAPS Take 1 capsule (50,000 Units total) by mouth every 7 (seven) days. On wednesdays  12 capsule  3  . prochlorperazine (COMPAZINE) 10 MG tablet Take 1 tablet (10 mg total) by mouth every 6 (six) hours as needed.  30 tablet  1   No current facility-administered medications for this visit.    SURGICAL HISTORY:  Past Surgical History   Procedure Laterality Date  . Tubal ligation  2002  . Thoracentesis  09/02/11, 09/09/11    right-side pleural effusion    REVIEW OF SYSTEMS:  A comprehensive review of systems was negative except for: Gastrointestinal: positive for diarrhea Mild skin on the face   PHYSICAL EXAMINATION: General appearance: alert, cooperative and no distress Head: Normocephalic, without obvious abnormality, atraumatic Neck: no adenopathy Lymph nodes: Cervical, supraclavicular, and axillary nodes normal. Resp: clear to auscultation bilaterally Cardio: regular rate and rhythm, S1, S2 normal, no murmur, click, rub or gallop GI: soft, non-tender; bowel sounds normal; no masses,  no organomegaly Extremities: extremities normal, atraumatic, no cyanosis or edema Neurologic: Alert and oriented X 3, normal strength and tone. Normal symmetric reflexes. Normal coordination and gait  ECOG PERFORMANCE STATUS: 1 - Symptomatic but completely ambulatory  Blood pressure 124/89, pulse 95, temperature 98.2 F (36.8 C), temperature source Oral, resp. rate 18, height 5\' 6"  (1.676 m), weight 251 lb 12.8 oz (114.216 kg).  LABORATORY DATA: Lab Results  Component Value Date   WBC 5.3 07/24/2012   HGB 12.0 07/24/2012   HCT 36.5 07/24/2012   MCV 92.5 07/24/2012   PLT 283 07/24/2012      Chemistry      Component Value Date/Time   NA 138 07/24/2012 0922   NA 139 05/23/2012 1305   K 4.1 07/24/2012 0922   K 4.0 05/23/2012 1305   CL 100 07/24/2012 0922   CL 100 05/23/2012 1305   CO2 27 07/24/2012 0922   CO2 30 05/23/2012 1305   BUN 20.5 07/24/2012 0922   BUN 16 05/23/2012 1305   CREATININE 0.8 07/24/2012 0922   CREATININE 0.74 05/23/2012 1305      Component Value Date/Time   CALCIUM 9.1 07/24/2012 0922   CALCIUM 9.0 05/23/2012 1305   ALKPHOS 97 07/24/2012 0922   ALKPHOS 113 05/23/2012 1305   AST 23 07/24/2012 0922   AST 15 05/23/2012 1305   ALT 14 07/24/2012 0922   ALT 7 05/23/2012 1305   BILITOT 0.76 07/24/2012 0922   BILITOT 0.6 05/23/2012 1305        RADIOGRAPHIC STUDIES: Ct Chest W Contrast  07/24/2012  *RADIOLOGY REPORT*  Clinical Data:  Non-small cell lung cancer.  Radiation therapy complete.  Chemotherapy ongoing.  Restaging scan.  CT CHEST, ABDOMEN AND PELVIS WITH CONTRAST  Technique:  Multidetector CT imaging of the chest, abdomen and pelvis was performed following the standard protocol during bolus administration of intravenous contrast.  Contrast: OMNIPAQUE IOHEXOL 300 MG/ML  SOLN  Comparison:  CT of the chest abdomen and pelvis 04/25/2012.   CT CHEST  Findings:  Mediastinum: Heart size  is mildly enlarged. There is no significant pericardial fluid, thickening or pericardial calcification. Extensive calcifications of the mitral-aortic intervalvular fibrosa. No pathologically enlarged mediastinal or hilar lymph nodes. Esophagus is unremarkable in appearance.  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  Lungs/Pleura: Compared to the prior examination the mass in the right middle lobe appears very similar in size measuring approximately 2.5 x 4.8 cm.  This contains a small focus of calcium within it.  There continues to be a chronic volume loss and architectural distortion throughout the right lung.  Additional mass-like opacity in the right lower lobe (image 45 of series 2) is also similar measuring approximately 3.2 x 1.8 cm.  Area of cystic change in the superior segment of the right lower lobe is also unchanged.  Chronic thick-walled nodular right pleural effusion (presumably malignant) is a small to moderate in size and overall similar to the prior examination.  No new nodules in the left lung. No left pleural effusion.  Musculoskeletal: Numerous sclerotic lesions are again seen throughout the visualized axial and appendicular skeleton, similar to the prior study, most compatible with widespread bony metastases.  IMPRESSION:  1.  Overall, the appearance of the chest is very similar to the prior  examination from 04/25/2012, as detailed above.  No definite signs of progression of disease are noted on today's study. 2.  Widespread bony metastatic disease is again noted. 3.  Large Morgagni hernia again noted.   CT ABDOMEN AND PELVIS  Findings:  Abdomen/Pelvis: The appearance of the liver, gallbladder, pancreas, spleen, bilateral adrenal glands and the left kidney is unremarkable.  There is an altered axis of the right kidney (a normal anatomical variant) which is unchanged.  The right kidney is otherwise unremarkable in appearance.  No significant volume of ascites.  No pneumoperitoneum.  No pathologic distension of small bowel.  No definite pathologic lymphadenopathy identified within the abdomen or pelvis.  Normal appendix.  Uterus and ovaries are unremarkable appearance.  Urinary bladder is normal in appearance.  Musculoskeletal: Diffuse sclerosis throughout the visualized axial and appendicular skeleton is compatible with widespread bony metastases.  IMPRESSION:  1.  Widespread bony metastases again noted. 2.   No other signs of metastatic disease to the abdomen or pelvis.   Original Report Authenticated By: Trudie Reed, M.D.    ASSESSMENT: This is a very pleasant 52 years old African American female was metastatic non-small cell lung cancer, adenocarcinoma with positive EGFR mutation in exon 76 diagnosed in June of 2016 and has been on treatment with [name] since that time.    PLAN: The patient is doing fine today with no significant adverse effect from her treatment except for mild skin rash and few episodes of diarrhea. I have a lengthy discussion with the patient today about her condition and I recommended for her to continue treatment with [name] with the current dose. She would come back for followup visit in one month with repeat CBC and comprehensive metabolic panel. For the diarrhea, the patient was advised to take Imodium on as-needed basis. For the skin rash, the patient was  advised to continue on clindamycin lotions. She was advised to call immediately if she has any concerning symptoms in the interval.  All questions were answered. The patient knows to call the clinic with any problems, questions or concerns. We can certainly see the patient much sooner if necessary.

## 2012-07-24 NOTE — Patient Instructions (Addendum)
No evidence for disease progression on his recent scan.  Followup visit in one month. 

## 2012-07-24 NOTE — Telephone Encounter (Signed)
lvm for pt regarding to nxt two appts...mailed pt appt sched and letter

## 2012-07-26 ENCOUNTER — Other Ambulatory Visit: Payer: Self-pay | Admitting: *Deleted

## 2012-07-26 ENCOUNTER — Other Ambulatory Visit: Payer: Self-pay | Admitting: Family Medicine

## 2012-07-27 ENCOUNTER — Telehealth: Payer: Self-pay | Admitting: Internal Medicine

## 2012-07-27 NOTE — Telephone Encounter (Signed)
Harpreet CALLED TO R/S INJ TO TUESDAY...dONE

## 2012-07-28 ENCOUNTER — Ambulatory Visit: Payer: BC Managed Care – PPO

## 2012-08-01 ENCOUNTER — Ambulatory Visit: Payer: BC Managed Care – PPO

## 2012-08-01 ENCOUNTER — Telehealth: Payer: Self-pay | Admitting: Internal Medicine

## 2012-08-01 ENCOUNTER — Other Ambulatory Visit: Payer: Self-pay | Admitting: Oncology

## 2012-08-01 ENCOUNTER — Ambulatory Visit (HOSPITAL_BASED_OUTPATIENT_CLINIC_OR_DEPARTMENT_OTHER): Payer: BC Managed Care – PPO

## 2012-08-01 VITALS — BP 119/84 | HR 98 | Temp 98.0°F

## 2012-08-01 DIAGNOSIS — C3400 Malignant neoplasm of unspecified main bronchus: Secondary | ICD-10-CM

## 2012-08-01 DIAGNOSIS — C7951 Secondary malignant neoplasm of bone: Secondary | ICD-10-CM

## 2012-08-01 DIAGNOSIS — C3491 Malignant neoplasm of unspecified part of right bronchus or lung: Secondary | ICD-10-CM

## 2012-08-01 MED ORDER — DENOSUMAB 120 MG/1.7ML ~~LOC~~ SOLN
120.0000 mg | Freq: Once | SUBCUTANEOUS | Status: AC
  Administered 2012-08-01: 120 mg via SUBCUTANEOUS
  Filled 2012-08-01: qty 1.7

## 2012-08-01 NOTE — Telephone Encounter (Signed)
pt called she needed to r/s in due to emergency

## 2012-08-01 NOTE — Patient Instructions (Addendum)
Denosumab injection What is this medicine? DENOSUMAB slows bone breakdown. It is used to treat osteoporosis in women after menopause and in men. This medicine is also used to prevent bone fractures and other bone problems caused by cancer bone metastases. This medicine may be used for other purposes; ask your health care provider or pharmacist if you have questions. What should I tell my health care provider before I take this medicine? They need to know if you have any of these conditions: -dental disease -eczema -infection or history of infections -kidney disease or on dialysis -low blood calcium or vitamin D -malabsorption syndrome -scheduled to have surgery or tooth extraction -taking medicine that contains denosumab -thyroid or parathyroid disease -an unusual reaction to denosumab, other medicines, foods, dyes, or preservatives -pregnant or trying to get pregnant -breast-feeding How should I use this medicine? This medicine is for injection under the skin. It is given by a health care professional in a hospital or clinic setting. If you are getting Prolia, a special MedGuide will be given to you by the pharmacist with each prescription and refill. Be sure to read this information carefully each time. Talk to your pediatrician regarding the use of this medicine in children. Special care may be needed. Overdosage: If you think you've taken too much of this medicine contact a poison control center or emergency room at once. Overdosage: If you think you have taken too much of this medicine contact a poison control center or emergency room at once. NOTE: This medicine is only for you. Do not share this medicine with others. What if I miss a dose? It is important not to miss your dose. Call your doctor or health care professional if you are unable to keep an appointment. What may interact with this medicine? Do not take this medicine with any of the following medications: -other medicines  containing denosumab This medicine may also interact with the following medications: -medicines that suppress the immune system -medicines that treat cancer -steroid medicines like prednisone or cortisone This list may not describe all possible interactions. Give your health care provider a list of all the medicines, herbs, non-prescription drugs, or dietary supplements you use. Also tell them if you smoke, drink alcohol, or use illegal drugs. Some items may interact with your medicine. What should I watch for while using this medicine? Visit your doctor or health care professional for regular checks on your progress. Your doctor or health care professional may order blood tests and other tests to see how you are doing. Call your doctor or health care professional if you get a cold or other infection while receiving this medicine. Do not treat yourself. This medicine may decrease your body's ability to fight infection. You should make sure you get enough calcium and vitamin D while you are taking this medicine, unless your doctor tells you not to. Discuss the foods you eat and the vitamins you take with your health care professional. See your dentist regularly. Brush and floss your teeth as directed. Before you have any dental work done, tell your dentist you are receiving this medicine. What side effects may I notice from receiving this medicine? Side effects that you should report to your doctor or health care professional as soon as possible: -allergic reactions like skin rash, itching or hives, swelling of the face, lips, or tongue -breathing problems -chest pain -fast, irregular heartbeat -feeling faint or lightheaded, falls -fever, chills, or any other sign of infection -muscle spasms, tightening, or twitches -numbness   or tingling -skin blisters or bumps, or is dry, peels, or red -slow healing or unexplained pain in the mouth or jaw -unusual bleeding or bruising Side effects that  usually do not require medical attention (Report these to your doctor or health care professional if they continue or are bothersome.): -muscle pain -stomach upset, gas This list may not describe all possible side effects. Call your doctor for medical advice about side effects. You may report side effects to FDA at 1-800-FDA-1088. Where should I keep my medicine? This medicine is only given in a clinic, doctor's office, or other health care setting and will not be stored at home. NOTE: This sheet is a summary. It may not cover all possible information. If you have questions about this medicine, talk to your doctor, pharmacist, or health care provider.  2013, [employer]. (12/15/2010 3:40:41 PM)  

## 2012-08-04 ENCOUNTER — Other Ambulatory Visit: Payer: Self-pay | Admitting: Family Medicine

## 2012-08-04 ENCOUNTER — Telehealth: Payer: Self-pay | Admitting: Internal Medicine

## 2012-08-04 NOTE — Telephone Encounter (Signed)
returned pt call that she would like to r/s appt....advised pt to call back to let me know when she wanted to r/s

## 2012-08-07 ENCOUNTER — Telehealth: Payer: Self-pay | Admitting: Internal Medicine

## 2012-08-08 ENCOUNTER — Telehealth: Payer: Self-pay | Admitting: Internal Medicine

## 2012-08-08 NOTE — Telephone Encounter (Signed)
Returned pt's call to r/s 6/2 lb appt. Pt moved appt to 5/27 and then changed her mind and moved appt for 5/30. Pt has new appt d/t for 5/30 @ 10:30am.

## 2012-08-15 ENCOUNTER — Other Ambulatory Visit: Payer: BC Managed Care – PPO

## 2012-08-15 ENCOUNTER — Ambulatory Visit: Payer: BC Managed Care – PPO

## 2012-08-15 ENCOUNTER — Other Ambulatory Visit: Payer: BC Managed Care – PPO | Admitting: Lab

## 2012-08-16 ENCOUNTER — Telehealth: Payer: Self-pay | Admitting: Medical Oncology

## 2012-08-16 ENCOUNTER — Telehealth: Payer: Self-pay | Admitting: Internal Medicine

## 2012-08-16 NOTE — Telephone Encounter (Signed)
Asking to skip appts in June because of training for a new job . She can resume appts in July. Note sent to Dr Arbutus Ped.

## 2012-08-16 NOTE — Telephone Encounter (Signed)
wants to cancel 6.9.14 appt adn will call back to r/s

## 2012-08-17 ENCOUNTER — Telehealth: Payer: Self-pay | Admitting: Internal Medicine

## 2012-08-17 ENCOUNTER — Other Ambulatory Visit: Payer: Self-pay | Admitting: Medical Oncology

## 2012-08-17 MED ORDER — OXYCODONE HCL 5 MG PO CAPS: ORAL_CAPSULE | ORAL | Status: DC

## 2012-08-17 NOTE — Telephone Encounter (Signed)
sw. pt and advised that i s/w Diane and she will call pt.Marland KitchenMarland KitchenMarland Kitchen

## 2012-08-17 NOTE — Telephone Encounter (Signed)
Pt called and changed lab time to 11:30am 5/30

## 2012-08-18 ENCOUNTER — Other Ambulatory Visit (HOSPITAL_BASED_OUTPATIENT_CLINIC_OR_DEPARTMENT_OTHER): Payer: BC Managed Care – PPO | Admitting: Lab

## 2012-08-18 DIAGNOSIS — C3490 Malignant neoplasm of unspecified part of unspecified bronchus or lung: Secondary | ICD-10-CM

## 2012-08-18 DIAGNOSIS — C3400 Malignant neoplasm of unspecified main bronchus: Secondary | ICD-10-CM

## 2012-08-18 LAB — CBC WITH DIFFERENTIAL/PLATELET
BASO%: 0.5 % (ref 0.0–2.0)
Basophils Absolute: 0 10*3/uL (ref 0.0–0.1)
EOS%: 1.1 % (ref 0.0–7.0)
HGB: 11.6 g/dL (ref 11.6–15.9)
MCH: 30.8 pg (ref 25.1–34.0)
MCHC: 33.4 g/dL (ref 31.5–36.0)
RDW: 15.6 % — ABNORMAL HIGH (ref 11.2–14.5)
WBC: 4.7 10*3/uL (ref 3.9–10.3)
lymph#: 1.4 10*3/uL (ref 0.9–3.3)

## 2012-08-18 LAB — COMPREHENSIVE METABOLIC PANEL (CC13)
ALT: 15 U/L (ref 0–55)
AST: 21 U/L (ref 5–34)
Albumin: 3.3 g/dL — ABNORMAL LOW (ref 3.5–5.0)
Calcium: 8.9 mg/dL (ref 8.4–10.4)
Chloride: 104 mEq/L (ref 98–107)
Potassium: 4.3 mEq/L (ref 3.5–5.1)

## 2012-08-21 ENCOUNTER — Other Ambulatory Visit: Payer: BC Managed Care – PPO | Admitting: Lab

## 2012-08-21 ENCOUNTER — Ambulatory Visit: Payer: BC Managed Care – PPO

## 2012-08-25 ENCOUNTER — Other Ambulatory Visit: Payer: BC Managed Care – PPO | Admitting: Lab

## 2012-08-25 ENCOUNTER — Ambulatory Visit: Payer: BC Managed Care – PPO

## 2012-08-28 ENCOUNTER — Other Ambulatory Visit: Payer: BC Managed Care – PPO | Admitting: Lab

## 2012-08-28 ENCOUNTER — Encounter: Payer: Self-pay | Admitting: Internal Medicine

## 2012-08-28 ENCOUNTER — Ambulatory Visit: Payer: BC Managed Care – PPO

## 2012-08-28 ENCOUNTER — Ambulatory Visit: Payer: BC Managed Care – PPO | Admitting: Internal Medicine

## 2012-08-28 NOTE — Progress Notes (Signed)
Received fax from [facility]. They have trying to contact the patient about her shipment of [name] and have not been able to speak with her. I called and left her a message to call me so I can advise her what they need.

## 2012-09-08 ENCOUNTER — Telehealth: Payer: Self-pay | Admitting: Internal Medicine

## 2012-09-08 NOTE — Telephone Encounter (Signed)
pt called and needed to r/s appt to wednesday...done

## 2012-09-18 ENCOUNTER — Ambulatory Visit: Payer: BC Managed Care – PPO

## 2012-09-18 ENCOUNTER — Other Ambulatory Visit: Payer: BC Managed Care – PPO | Admitting: Lab

## 2012-09-20 ENCOUNTER — Ambulatory Visit: Payer: BC Managed Care – PPO

## 2012-09-20 ENCOUNTER — Other Ambulatory Visit: Payer: Self-pay | Admitting: *Deleted

## 2012-09-20 ENCOUNTER — Ambulatory Visit: Payer: BC Managed Care – PPO | Admitting: Internal Medicine

## 2012-09-20 ENCOUNTER — Telehealth: Payer: Self-pay | Admitting: *Deleted

## 2012-09-20 ENCOUNTER — Other Ambulatory Visit: Payer: BC Managed Care – PPO | Admitting: Lab

## 2012-09-20 MED ORDER — OXYCODONE HCL 5 MG PO CAPS: ORAL_CAPSULE | ORAL | Status: DC

## 2012-09-20 NOTE — Telephone Encounter (Signed)
Pt called to r/s her appts form 09/20/12. gv appt for labs@ 11:45am, ov@12 :15pm, and inj @12 :45pm for 09/21/12. Pt is aware...td

## 2012-09-21 ENCOUNTER — Other Ambulatory Visit (HOSPITAL_BASED_OUTPATIENT_CLINIC_OR_DEPARTMENT_OTHER): Payer: BC Managed Care – PPO | Admitting: Lab

## 2012-09-21 ENCOUNTER — Encounter: Payer: Self-pay | Admitting: Internal Medicine

## 2012-09-21 ENCOUNTER — Other Ambulatory Visit: Payer: BC Managed Care – PPO | Admitting: Lab

## 2012-09-21 ENCOUNTER — Telehealth: Payer: Self-pay | Admitting: *Deleted

## 2012-09-21 ENCOUNTER — Ambulatory Visit (HOSPITAL_BASED_OUTPATIENT_CLINIC_OR_DEPARTMENT_OTHER): Payer: BC Managed Care – PPO | Admitting: Internal Medicine

## 2012-09-21 ENCOUNTER — Ambulatory Visit: Payer: BC Managed Care – PPO

## 2012-09-21 ENCOUNTER — Ambulatory Visit (HOSPITAL_BASED_OUTPATIENT_CLINIC_OR_DEPARTMENT_OTHER): Payer: BC Managed Care – PPO

## 2012-09-21 ENCOUNTER — Ambulatory Visit: Payer: BC Managed Care – PPO | Admitting: Internal Medicine

## 2012-09-21 VITALS — BP 131/94 | HR 100 | Temp 97.6°F | Resp 20

## 2012-09-21 VITALS — BP 127/90 | HR 103 | Temp 99.1°F | Resp 20 | Ht 66.0 in | Wt 247.0 lb

## 2012-09-21 DIAGNOSIS — R21 Rash and other nonspecific skin eruption: Secondary | ICD-10-CM

## 2012-09-21 DIAGNOSIS — C3400 Malignant neoplasm of unspecified main bronchus: Secondary | ICD-10-CM

## 2012-09-21 DIAGNOSIS — R197 Diarrhea, unspecified: Secondary | ICD-10-CM

## 2012-09-21 DIAGNOSIS — C7951 Secondary malignant neoplasm of bone: Secondary | ICD-10-CM

## 2012-09-21 DIAGNOSIS — C3491 Malignant neoplasm of unspecified part of right bronchus or lung: Secondary | ICD-10-CM

## 2012-09-21 DIAGNOSIS — C785 Secondary malignant neoplasm of large intestine and rectum: Secondary | ICD-10-CM

## 2012-09-21 LAB — COMPREHENSIVE METABOLIC PANEL (CC13)
ALT: 20 U/L (ref 0–55)
AST: 29 U/L (ref 5–34)
Albumin: 3.5 g/dL (ref 3.5–5.0)
Calcium: 9 mg/dL (ref 8.4–10.4)
Chloride: 105 mEq/L (ref 98–109)
Creatinine: 0.8 mg/dL (ref 0.6–1.1)
Potassium: 4.2 mEq/L (ref 3.5–5.1)

## 2012-09-21 LAB — CBC WITH DIFFERENTIAL/PLATELET
BASO%: 0.8 % (ref 0.0–2.0)
EOS%: 2.2 % (ref 0.0–7.0)
MCH: 31.8 pg (ref 25.1–34.0)
MCHC: 33.6 g/dL (ref 31.5–36.0)
RBC: 3.7 10*6/uL (ref 3.70–5.45)
RDW: 16.9 % — ABNORMAL HIGH (ref 11.2–14.5)
lymph#: 1.6 10*3/uL (ref 0.9–3.3)

## 2012-09-21 MED ORDER — DENOSUMAB 120 MG/1.7ML ~~LOC~~ SOLN
120.0000 mg | Freq: Once | SUBCUTANEOUS | Status: AC
  Administered 2012-09-21: 120 mg via SUBCUTANEOUS
  Filled 2012-09-21: qty 1.7

## 2012-09-21 NOTE — Progress Notes (Signed)
[facility] Telephone:([phone]   Fax:(336) [phone]  OFFICE PROGRESS NOTE  [name], MD [address] [place] [address]  DIAGNOSIS: Metastatic non-small cell lung cancer, adenocarcinoma with positive EGFR mutation in exon 19 diagnosed in June of 2013   PRIOR THERAPY: palliative radiotherapy to the right lung under the care of Dr. Dayton Scrape expected to be completed on 10/29/2011.   CURRENT THERAPY:  1. [name] 150 mg by mouth daily started 10/11/2011. Status post 11 months of therapy 2. Xgeva 120 mg subcutaneously every 4 weeks   INTERVAL HISTORY: Beth [name] 52 y.o. female returns to the clinic today for followup visit. The patient is feeling fine today with no specific complaints. She missed her followup appointment last month because she was busy with her new job. She recently quit her job because it was so stressful. She continues to have few episodes of diarrhea if he now and then. She denied having any significant weight loss or night sweats. She has no chest pain, shortness of breath, cough or hemoptysis. The patient has no nausea or vomiting. She has grade 1 skin rash.  MEDICAL HISTORY: Past Medical History  Diagnosis Date  . Asthma   . GERD (gastroesophageal reflux disease)   . Hypertension   . Heart murmur   . Fibromyalgia   . Arthritis of knee, degenerative   . Endocarditis     as teenager  . Complication of anesthesia     difficulty waking up  . Shortness of breath   . Lung mass     R- ADENOCARCINOMA  . Sleep apnea     no longer using CPAP  . Pleural effusion 08/30/11  . 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 Anxiety   . Depression   . History of radiation therapy 09/29/11-11/04/2011    right lung 2700cGy 15 sessions    ALLERGIES:  is allergic to meloxicam.  MEDICATIONS:  Current Outpatient Prescriptions  Medication Sig Dispense Refill  . cyclobenzaprine (FLEXERIL) 5 MG tablet TAKE 1 TABLET BY MOUTH  ONCE DAILY AS NEEDED FOR MUSCLE SPASMS  30 tablet  1  . diphenhydrAMINE (BENADRYL) 25 mg capsule One to two tabs every 6 hours prn allergies  120 capsule  3  . erlotinib ([name]) 150 MG tablet Take 1 tablet (150 mg total) by mouth daily.  30 tablet  2  . fish oil-omega-3 fatty acids 1000 MG capsule Take 2 g by mouth daily.      . hydrochlorothiazide (HYDRODIURIL) 25 MG tablet TAKE ONE TABLET EVERY DAY  30 tablet  5  . Multiple Vitamins-Minerals (MULTIVITAMIN WITH MINERALS) tablet Take 1 tablet by mouth daily.      Marland Kitchen oxycodone (OXY-IR) 5 MG capsule 1 tab every 6 hours as needed for pain  60 capsule  0  . Venlafaxine HCl 150 MG TB24 Take 150 mg by mouth daily.      Marland Kitchen venlafaxine XR (EFFEXOR-XR) 150 MG 24 hr capsule TAKE ONE CAPSULE BY MOUTH EVERY DAY  90 capsule  1  . Vitamin D, Ergocalciferol, (DRISDOL) 50000 UNITS CAPS Take 1 capsule (50,000 Units total) by mouth every 7 (seven) days. On wednesdays  12 capsule  3  . ARIPiprazole (ABILIFY) 10 MG tablet Take 1 tablet (10 mg total) by mouth daily.  90 tablet  1  . chlorpheniramine-HYDROcodone (TUSSIONEX) 10-8 MG/5ML LQCR TAKE (1 TEASPOONFUL) BY MOUTH EVERY 12 HOURS AS NEEDED  140 mL  0  .  clindamycin (CLEOCIN T) 1 % external solution APPLY TOPICALLY TWICE A DAY  240 mL  3  . Fe Fum-FePoly-Vit C-Vit B3 (INTEGRA) 62.5-62.5-40-3 MG CAPS Take 1 tablet by mouth daily.      Marland Kitchen lamoTRIgine (LAMICTAL) 25 MG tablet Take 1 tablet (25 mg total) by mouth 2 (two) times daily.  180 tablet  1  . pantoprazole (PROTONIX) 40 MG tablet Take 1 tablet (40 mg total) by mouth daily.  90 tablet  1  . prochlorperazine (COMPAZINE) 10 MG tablet Take 1 tablet (10 mg total) by mouth every 6 (six) hours as needed.  30 tablet  1  . ranitidine (ZANTAC) 150 MG tablet Take 150 mg by mouth 2 (two) times daily. Take at least 2 hours apart from [name] medication to avoid Drug interaction       No current facility-administered medications for this visit.    SURGICAL HISTORY:    Past Surgical History  Procedure Laterality Date  . Tubal ligation  2002  . Thoracentesis  09/02/11, 09/09/11    right-side pleural effusion    REVIEW OF SYSTEMS:  A comprehensive review of systems was negative except for: Gastrointestinal: positive for diarrhea   PHYSICAL EXAMINATION: General appearance: alert, cooperative and no distress Head: Normocephalic, without obvious abnormality, atraumatic Neck: no adenopathy Lymph nodes: Cervical, supraclavicular, and axillary nodes normal. Resp: clear to auscultation bilaterally Cardio: regular rate and rhythm, S1, S2 normal, no murmur, click, rub or gallop GI: soft, non-tender; bowel sounds normal; no masses,  no organomegaly Extremities: extremities normal, atraumatic, no cyanosis or edema  ECOG PERFORMANCE STATUS: 1 - Symptomatic but completely ambulatory  Blood pressure 127/90, pulse 103, temperature 99.1 F (37.3 C), temperature source Oral, resp. rate 20, height 5\' 6"  (1.676 m), weight 247 lb (112.038 kg).  LABORATORY DATA: Lab Results  Component Value Date   WBC 4.6 09/21/2012   HGB 11.8 09/21/2012   HCT 35.0 09/21/2012   MCV 94.5 09/21/2012   PLT 232 09/21/2012      Chemistry      Component Value Date/Time   NA 138 08/18/2012 1154   NA 139 05/23/2012 1305   K 4.3 08/18/2012 1154   K 4.0 05/23/2012 1305   CL 104 08/18/2012 1154   CL 100 05/23/2012 1305   CO2 27 08/18/2012 1154   CO2 30 05/23/2012 1305   BUN 13.5 08/18/2012 1154   BUN 16 05/23/2012 1305   CREATININE 0.8 08/18/2012 1154   CREATININE 0.74 05/23/2012 1305      Component Value Date/Time   CALCIUM 8.9 08/18/2012 1154   CALCIUM 9.0 05/23/2012 1305   ALKPHOS 90 08/18/2012 1154   ALKPHOS 113 05/23/2012 1305   AST 21 08/18/2012 1154   AST 15 05/23/2012 1305   ALT 15 08/18/2012 1154   ALT 7 05/23/2012 1305   BILITOT 0.76 08/18/2012 1154   BILITOT 0.6 05/23/2012 1305       RADIOGRAPHIC STUDIES: No results found.  ASSESSMENT AND PLAN: This is a very pleasant 52 years old African  American female with metastatic non-small cell lung cancer, adenocarcinoma with positive EGFR mutation in exon 19 currently undergoing treatment with [name] 150 mg by mouth daily status post 11 months of treatment. She is tolerating her treatment fairly well. I recommended for the patient to continue her current treatment was [name]. She was also advised to take Imodium after every episode of diarrhea. I would see her back for followup visit in one month with repeat CT scan of the chest,  abdomen and pelvis for restaging of her disease. She was advised to call immediately if she has any concerning symptoms in the interval.  All questions were answered. The patient knows to call the clinic with any problems, questions or concerns. We can certainly see the patient much sooner if necessary.

## 2012-09-21 NOTE — Patient Instructions (Signed)
Continue treatment with [name]. Follow up visit in one month with repeat CT scan of the chest, abdomen and pelvis. 

## 2012-09-21 NOTE — Telephone Encounter (Signed)
appts made and printed..the patient is aware that cs will give her a call w/ her CT appts...td

## 2012-09-29 ENCOUNTER — Telehealth: Payer: Self-pay | Admitting: Medical Oncology

## 2012-09-29 DIAGNOSIS — C3490 Malignant neoplasm of unspecified part of unspecified bronchus or lung: Secondary | ICD-10-CM

## 2012-09-29 MED ORDER — ERLOTINIB HCL 150 MG PO TABS: 150.0000 mg | ORAL_TABLET | Freq: Every day | ORAL | Status: DC

## 2012-09-29 NOTE — Telephone Encounter (Signed)
Faxed refill for tarceva to [facility]

## 2012-10-16 ENCOUNTER — Ambulatory Visit: Payer: BC Managed Care – PPO

## 2012-10-16 ENCOUNTER — Other Ambulatory Visit: Payer: BC Managed Care – PPO | Admitting: Lab

## 2012-10-24 ENCOUNTER — Other Ambulatory Visit: Payer: Self-pay | Admitting: *Deleted

## 2012-10-24 MED ORDER — OXYCODONE HCL 5 MG PO CAPS: ORAL_CAPSULE | ORAL | Status: DC

## 2012-10-26 ENCOUNTER — Ambulatory Visit (HOSPITAL_BASED_OUTPATIENT_CLINIC_OR_DEPARTMENT_OTHER): Payer: BC Managed Care – PPO | Admitting: Internal Medicine

## 2012-10-26 ENCOUNTER — Encounter: Payer: Self-pay | Admitting: Internal Medicine

## 2012-10-26 ENCOUNTER — Telehealth: Payer: Self-pay | Admitting: Internal Medicine

## 2012-10-26 ENCOUNTER — Ambulatory Visit (HOSPITAL_COMMUNITY)
Admission: RE | Admit: 2012-10-26 | Discharge: 2012-10-26 | Disposition: A | Discharge status: Home / Self Care | Payer: BC Managed Care – PPO | Source: Ambulatory Visit | Attending: Internal Medicine | Admitting: Internal Medicine

## 2012-10-26 ENCOUNTER — Other Ambulatory Visit: Payer: Self-pay | Admitting: Medical Oncology

## 2012-10-26 ENCOUNTER — Other Ambulatory Visit (HOSPITAL_BASED_OUTPATIENT_CLINIC_OR_DEPARTMENT_OTHER): Payer: BC Managed Care – PPO | Admitting: Lab

## 2012-10-26 VITALS — BP 160/108 | HR 112 | Temp 98.1°F | Resp 20 | Ht 66.0 in | Wt 252.3 lb

## 2012-10-26 DIAGNOSIS — C3491 Malignant neoplasm of unspecified part of right bronchus or lung: Secondary | ICD-10-CM

## 2012-10-26 DIAGNOSIS — C7951 Secondary malignant neoplasm of bone: Secondary | ICD-10-CM | POA: Insufficient documentation

## 2012-10-26 DIAGNOSIS — C3490 Malignant neoplasm of unspecified part of unspecified bronchus or lung: Secondary | ICD-10-CM | POA: Insufficient documentation

## 2012-10-26 DIAGNOSIS — J90 Pleural effusion, not elsewhere classified: Secondary | ICD-10-CM | POA: Insufficient documentation

## 2012-10-26 DIAGNOSIS — K7689 Other specified diseases of liver: Secondary | ICD-10-CM | POA: Insufficient documentation

## 2012-10-26 DIAGNOSIS — K449 Diaphragmatic hernia without obstruction or gangrene: Secondary | ICD-10-CM | POA: Insufficient documentation

## 2012-10-26 DIAGNOSIS — C3400 Malignant neoplasm of unspecified main bronchus: Secondary | ICD-10-CM

## 2012-10-26 DIAGNOSIS — Z923 Personal history of irradiation: Secondary | ICD-10-CM | POA: Insufficient documentation

## 2012-10-26 DIAGNOSIS — R197 Diarrhea, unspecified: Secondary | ICD-10-CM

## 2012-10-26 DIAGNOSIS — C7952 Secondary malignant neoplasm of bone marrow: Secondary | ICD-10-CM

## 2012-10-26 DIAGNOSIS — R1904 Left lower quadrant abdominal swelling, mass and lump: Secondary | ICD-10-CM | POA: Insufficient documentation

## 2012-10-26 DIAGNOSIS — Z9221 Personal history of antineoplastic chemotherapy: Secondary | ICD-10-CM | POA: Insufficient documentation

## 2012-10-26 LAB — COMPREHENSIVE METABOLIC PANEL (CC13)
ALT: 16 U/L (ref 0–55)
CO2: 22 mEq/L (ref 22–29)
Calcium: 8.8 mg/dL (ref 8.4–10.4)
Chloride: 107 mEq/L (ref 98–109)
Creatinine: 0.7 mg/dL (ref 0.6–1.1)
Glucose: 77 mg/dl (ref 70–140)
Total Protein: 8 g/dL (ref 6.4–8.3)

## 2012-10-26 LAB — CBC WITH DIFFERENTIAL/PLATELET
BASO%: 0.6 % (ref 0.0–2.0)
Eosinophils Absolute: 0.2 10*3/uL (ref 0.0–0.5)
HCT: 37.4 % (ref 34.8–46.6)
HGB: 12.5 g/dL (ref 11.6–15.9)
MCHC: 33.3 g/dL (ref 31.5–36.0)
MONO#: 0.6 10*3/uL (ref 0.1–0.9)
NEUT#: 2.4 10*3/uL (ref 1.5–6.5)
NEUT%: 41.4 % (ref 38.4–76.8)
WBC: 5.9 10*3/uL (ref 3.9–10.3)
lymph#: 2.6 10*3/uL (ref 0.9–3.3)

## 2012-10-26 MED ORDER — LOPERAMIDE HCL 2 MG PO TABS
4.0000 mg | ORAL_TABLET | ORAL | Status: AC
  Administered 2012-10-26: 4 mg via ORAL
  Filled 2012-10-26: qty 2

## 2012-10-26 MED ORDER — IOHEXOL 300 MG/ML  SOLN
100.0000 mL | Freq: Once | INTRAMUSCULAR | Status: AC | PRN
  Administered 2012-10-26: 100 mL via INTRAVENOUS

## 2012-10-26 NOTE — Progress Notes (Signed)
Reports she is having frequent loose stools now

## 2012-10-26 NOTE — Progress Notes (Signed)
[facility] [phone]   Fax:(336) [phone]  OFFICE PROGRESS NOTE  [name], MD [address] [place] [address]  DIAGNOSIS AND STAGE: Metastatic non-small cell lung cancer, adenocarcinoma with positive EGFR mutation in exon 19 diagnosed in June of 2013   PRIOR THERAPY: palliative radiotherapy to the right lung under the care of Dr. Dayton Scrape expected to be completed on 10/29/2011.   CURRENT THERAPY:  1. [name] 150 mg by mouth daily started 10/11/2011. Status post 12 months of therapy 2. Xgeva 120 mg subcutaneously every 4 weeks.  CHEMOTHERAPY INTENT: Palliative  CURRENT # OF CHEMOTHERAPY CYCLES:  13  CURRENT ANTIEMETICS: Compazine  CURRENT SMOKING STATUS: Former smoker but quit.  ORAL CHEMOTHERAPY AND CONSENT: Oral [name]  CURRENT BISPHOSPHONATES USE: Xgeva  PAIN MANAGEMENT: well-controlled with oxycodone when necessary.  NARCOTICS INDUCED CONSTIPATION: None  LIVING WILL AND CODE STATUS: Full code.  INTERVAL HISTORY: Beth [name] 52 y.o. female returns to the clinic today for follow up visit. The patient is feeling fine today with no specific complaints. She is tolerating her treatment with [name] fairly well with no significant adverse effects. She has no significant diarrhea but very mild skin rash but the patient denied having any chest pain, shortness breath, cough or hemoptysis. She has no nausea or vomiting. She had repeat CT scan of the chest, abdomen and pelvis performed earlier today and she is here for evaluation and discussion of her scan results.  MEDICAL HISTORY: Past Medical History  Diagnosis Date  . Asthma   . GERD (gastroesophageal reflux disease)   . Hypertension   . Heart murmur   . Fibromyalgia   . Arthritis of knee, degenerative   . Endocarditis     as teenager  . Complication of anesthesia     difficulty waking up  . Shortness of breath   . Lung mass     R- ADENOCARCINOMA  . Sleep  apnea     no longer using CPAP  . Pleural effusion 08/30/11  . 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 Anxiety   . Depression   . History of radiation therapy 09/29/11-11/04/2011    right lung 2700cGy 15 sessions    ALLERGIES:  is allergic to meloxicam.  MEDICATIONS:  Current Outpatient Prescriptions  Medication Sig Dispense Refill  . cyclobenzaprine (FLEXERIL) 5 MG tablet TAKE 1 TABLET BY MOUTH ONCE DAILY AS NEEDED FOR MUSCLE SPASMS  30 tablet  1  . diphenhydrAMINE (BENADRYL) 25 mg capsule One to two tabs every 6 hours prn allergies  120 capsule  3  . erlotinib ([name]) 150 MG tablet Take 1 tablet (150 mg total) by mouth daily.  30 tablet  2  . Fe Fum-FePoly-Vit C-Vit B3 (INTEGRA) 62.5-62.5-40-3 MG CAPS Take 1 tablet by mouth daily.      . fish oil-omega-3 fatty acids 1000 MG capsule Take 2 g by mouth daily.      . hydrochlorothiazide (HYDRODIURIL) 25 MG tablet TAKE ONE TABLET EVERY DAY  30 tablet  5  . loperamide (IMODIUM) 2 MG capsule Take 2 mg by mouth 4 (four) times daily as needed for diarrhea or loose stools.      . Multiple Vitamins-Minerals (MULTIVITAMIN WITH MINERALS) tablet Take 1 tablet by mouth daily.      Marland Kitchen oxycodone (OXY-IR) 5 MG capsule 1 tab every 6 hours as needed for pain  60 capsule  0  . pantoprazole (PROTONIX) 40 MG tablet Take 1  tablet (40 mg total) by mouth daily.  90 tablet  1  . venlafaxine XR (EFFEXOR-XR) 150 MG 24 hr capsule TAKE ONE CAPSULE BY MOUTH EVERY DAY  90 capsule  1  . prochlorperazine (COMPAZINE) 10 MG tablet Take 1 tablet (10 mg total) by mouth every 6 (six) hours as needed.  30 tablet  1  . Vitamin D, Ergocalciferol, (DRISDOL) 50000 UNITS CAPS Take 1 capsule (50,000 Units total) by mouth every 7 (seven) days. On wednesdays  12 capsule  3   No current facility-administered medications for this visit.    SURGICAL HISTORY:  Past Surgical History  Procedure Laterality Date  . Tubal ligation  2002  . Thoracentesis  09/02/11, 09/09/11     right-side pleural effusion    REVIEW OF SYSTEMS:  A comprehensive review of systems was negative.   PHYSICAL EXAMINATION: General appearance: alert, cooperative and no distress Head: Normocephalic, without obvious abnormality, atraumatic Neck: no adenopathy Lymph nodes: Cervical, supraclavicular, and axillary nodes normal. Resp: clear to auscultation bilaterally Cardio: regular rate and rhythm, S1, S2 normal, no murmur, click, rub or gallop GI: soft, non-tender; bowel sounds normal; no masses,  no organomegaly Extremities: extremities normal, atraumatic, no cyanosis or edema Neurologic: Alert and oriented X 3, normal strength and tone. Normal symmetric reflexes. Normal coordination and gait  ECOG PERFORMANCE STATUS: 1 - Symptomatic but completely ambulatory  Blood pressure 160/108, pulse 112, temperature 98.1 F (36.7 C), temperature source Oral, resp. rate 20, height 5\' 6"  (1.676 m), weight 252 lb 4.8 oz (114.443 kg).  LABORATORY DATA: Lab Results  Component Value Date   WBC 5.9 10/26/2012   HGB 12.5 10/26/2012   HCT 37.4 10/26/2012   MCV 95.2 10/26/2012   PLT 256 10/26/2012      Chemistry      Component Value Date/Time   NA 139 10/26/2012 1058   NA 139 05/23/2012 1305   K 4.0 10/26/2012 1058   K 4.0 05/23/2012 1305   CL 104 08/18/2012 1154   CL 100 05/23/2012 1305   CO2 22 10/26/2012 1058   CO2 30 05/23/2012 1305   BUN 6.6* 10/26/2012 1058   BUN 16 05/23/2012 1305   CREATININE 0.7 10/26/2012 1058   CREATININE 0.74 05/23/2012 1305      Component Value Date/Time   CALCIUM 8.8 10/26/2012 1058   CALCIUM 9.0 05/23/2012 1305   ALKPHOS 83 10/26/2012 1058   ALKPHOS 113 05/23/2012 1305   AST 26 10/26/2012 1058   AST 15 05/23/2012 1305   ALT 16 10/26/2012 1058   ALT 7 05/23/2012 1305   BILITOT 0.51 10/26/2012 1058   BILITOT 0.6 05/23/2012 1305       RADIOGRAPHIC STUDIES: No results found.  ASSESSMENT AND PLAN: this is a very pleasant 52 years old African American female with metastatic non-small cell lung  cancer, adenocarcinoma with EGFR mutation at exon 7 diagnosed in June of 2013 and currently undergoing treatment with oral [name] status post 12 months. The patient is tolerating her treatment fairly well with no significant adverse effects. I reviewed the images of her CT scan of the chest, abdomen and pelvis that was performed earlier today and I don't see any significant disease progression. The final report is still pending. I recommended for the patient to continue her current treatment with [name] with the same dose. She would come back for follow up visit in one month for reevaluation and management any adverse effect of her treatment. The patient was advised to call immediately if  she has any concerning symptoms in the interval.  The patient voices understanding of current disease status and treatment options and is in agreement with the current care plan.  All questions were answered. The patient knows to call the clinic with any problems, questions or concerns. We can certainly see the patient much sooner if necessary.  I spent 15 minutes counseling the patient face to face. The total time spent in the appointment was 25 minutes.

## 2012-10-26 NOTE — Telephone Encounter (Signed)
gv and printed appt sched and avs for pt  °

## 2012-10-27 ENCOUNTER — Ambulatory Visit: Payer: BC Managed Care – PPO

## 2012-10-27 ENCOUNTER — Other Ambulatory Visit: Payer: Self-pay | Admitting: *Deleted

## 2012-10-28 ENCOUNTER — Encounter: Payer: Self-pay | Admitting: Internal Medicine

## 2012-10-28 NOTE — Patient Instructions (Signed)
CURRENT THERAPY:  1. [name] 150 mg by mouth daily started 10/11/2011. Status post 12 months of therapy 2. Xgeva 120 mg subcutaneously every 4 weeks. CHEMOTHERAPY INTENT: Palliative  CURRENT # OF CHEMOTHERAPY CYCLES: 13  CURRENT ANTIEMETICS: Compazine  CURRENT SMOKING STATUS: Former smoker but quit.  ORAL CHEMOTHERAPY AND CONSENT: Oral [name]  CURRENT BISPHOSPHONATES USE: Xgeva  PAIN MANAGEMENT: well-controlled with oxycodone when necessary.  NARCOTICS INDUCED CONSTIPATION: None  LIVING WILL AND CODE STATUS: Full code.

## 2012-11-01 ENCOUNTER — Telehealth: Payer: Self-pay | Admitting: *Deleted

## 2012-11-01 ENCOUNTER — Ambulatory Visit (HOSPITAL_BASED_OUTPATIENT_CLINIC_OR_DEPARTMENT_OTHER): Payer: BC Managed Care – PPO

## 2012-11-01 VITALS — BP 153/110 | HR 96 | Temp 97.8°F | Resp 20

## 2012-11-01 DIAGNOSIS — C7951 Secondary malignant neoplasm of bone: Secondary | ICD-10-CM

## 2012-11-01 DIAGNOSIS — C3490 Malignant neoplasm of unspecified part of unspecified bronchus or lung: Secondary | ICD-10-CM

## 2012-11-01 DIAGNOSIS — C3400 Malignant neoplasm of unspecified main bronchus: Secondary | ICD-10-CM

## 2012-11-01 MED ORDER — DENOSUMAB 120 MG/1.7ML ~~LOC~~ SOLN
120.0000 mg | Freq: Once | SUBCUTANEOUS | Status: AC
  Administered 2012-11-01: 120 mg via SUBCUTANEOUS
  Filled 2012-11-01: qty 1.7

## 2012-11-01 NOTE — Telephone Encounter (Signed)
Pt called stating she has dry scabs in her nose and would like for Dr Donnald Garre to call in a rx for it.  Per Dr Donnald Garre, no rx needs to be called in, she can used saline spray and Vaseline.  Pt verbalized understanding.  SLJ

## 2012-11-01 NOTE — Progress Notes (Signed)
Pt says she has not been taking BP meds.m

## 2012-11-01 NOTE — Patient Instructions (Addendum)
Call MD for problems 

## 2012-11-02 ENCOUNTER — Other Ambulatory Visit: Payer: Self-pay | Admitting: *Deleted

## 2012-11-02 DIAGNOSIS — C3490 Malignant neoplasm of unspecified part of unspecified bronchus or lung: Secondary | ICD-10-CM

## 2012-11-02 MED ORDER — ERLOTINIB HCL 150 MG PO TABS: 150.0000 mg | ORAL_TABLET | Freq: Every day | ORAL | Status: DC

## 2012-11-10 ENCOUNTER — Telehealth: Payer: Self-pay | Admitting: *Deleted

## 2012-11-10 NOTE — Telephone Encounter (Signed)
Pt called and stated that she started at new job and she is unable to come to an appt any earlier then 4pm.  Per Dr Donnald Garre, okay for pt to come at 4pm for labs and see AJ.  Also scheduled injection at 445pm.  Spoke with Brandy in the pharmacy, she states as long as patient is not running behind they should be able to have the xgeva made for her, otherwise her injection may need to be r/s to the next day.  SLJ

## 2012-11-22 ENCOUNTER — Telehealth: Payer: Self-pay | Admitting: Medical Oncology

## 2012-11-22 NOTE — Telephone Encounter (Signed)
Pt is not due for xgeva on 9/4. Last dose given on 8/13. I left message for pt to call back.

## 2012-11-23 ENCOUNTER — Encounter: Payer: Self-pay | Admitting: Physician Assistant

## 2012-11-23 ENCOUNTER — Other Ambulatory Visit (HOSPITAL_BASED_OUTPATIENT_CLINIC_OR_DEPARTMENT_OTHER): Payer: BC Managed Care – PPO

## 2012-11-23 ENCOUNTER — Ambulatory Visit (HOSPITAL_BASED_OUTPATIENT_CLINIC_OR_DEPARTMENT_OTHER): Payer: BC Managed Care – PPO | Admitting: Physician Assistant

## 2012-11-23 ENCOUNTER — Other Ambulatory Visit: Payer: BC Managed Care – PPO | Admitting: Lab

## 2012-11-23 ENCOUNTER — Ambulatory Visit: Payer: BC Managed Care – PPO

## 2012-11-23 ENCOUNTER — Ambulatory Visit: Payer: BC Managed Care – PPO | Admitting: Physician Assistant

## 2012-11-23 DIAGNOSIS — R110 Nausea: Secondary | ICD-10-CM

## 2012-11-23 DIAGNOSIS — C3400 Malignant neoplasm of unspecified main bronchus: Secondary | ICD-10-CM

## 2012-11-23 DIAGNOSIS — C7951 Secondary malignant neoplasm of bone: Secondary | ICD-10-CM

## 2012-11-23 DIAGNOSIS — C3491 Malignant neoplasm of unspecified part of right bronchus or lung: Secondary | ICD-10-CM

## 2012-11-23 DIAGNOSIS — I10 Essential (primary) hypertension: Secondary | ICD-10-CM

## 2012-11-23 LAB — CBC WITH DIFFERENTIAL/PLATELET
BASO%: 1.1 % (ref 0.0–2.0)
EOS%: 1.7 % (ref 0.0–7.0)
HCT: 37.2 % (ref 34.8–46.6)
HGB: 12.3 g/dL (ref 11.6–15.9)
MCH: 31.8 pg (ref 25.1–34.0)
MCHC: 33 g/dL (ref 31.5–36.0)
NEUT#: 3.2 10*3/uL (ref 1.5–6.5)
NEUT%: 58.3 % (ref 38.4–76.8)
RBC: 3.86 10*6/uL (ref 3.70–5.45)
WBC: 5.4 10*3/uL (ref 3.9–10.3)
lymph#: 1.7 10*3/uL (ref 0.9–3.3)

## 2012-11-23 LAB — COMPREHENSIVE METABOLIC PANEL (CC13)
ALT: 20 U/L (ref 0–55)
AST: 26 U/L (ref 5–34)
Calcium: 9.3 mg/dL (ref 8.4–10.4)
Chloride: 104 mEq/L (ref 98–109)
Creatinine: 0.8 mg/dL (ref 0.6–1.1)
Sodium: 142 mEq/L (ref 136–145)
Total Bilirubin: 0.56 mg/dL (ref 0.20–1.20)

## 2012-11-23 MED ORDER — PROCHLORPERAZINE MALEATE 10 MG PO TABS: 10.0000 mg | ORAL_TABLET | Freq: Four times a day (QID) | ORAL | Status: DC | PRN

## 2012-11-23 MED ORDER — OXYCODONE HCL 5 MG PO CAPS: ORAL_CAPSULE | ORAL | Status: DC

## 2012-11-23 NOTE — Progress Notes (Addendum)
[facility] Telephone:(336) [phone]   Fax:(336) [phone]  SHARED VISIT PROGRESS NOTE  [name], MD [address] [place] [address]  DIAGNOSIS AND STAGE: Metastatic non-small cell lung cancer, adenocarcinoma with positive EGFR mutation in exon 19 diagnosed in June of 2013   PRIOR THERAPY: palliative radiotherapy to the right lung under the care of Dr. Dayton Scrape expected to be completed on 10/29/2011.   CURRENT THERAPY:  1. [name] 150 mg by mouth daily started 10/11/2011. Status post 13 months of therapy 2. Xgeva 120 mg subcutaneously every 4 weeks.  CHEMOTHERAPY INTENT: Palliative  CURRENT # OF CHEMOTHERAPY CYCLES:  14  CURRENT ANTIEMETICS: Compazine  CURRENT SMOKING STATUS: Former smoker but quit.  ORAL CHEMOTHERAPY AND CONSENT: Oral [name]  CURRENT BISPHOSPHONATES USE: Xgeva  PAIN MANAGEMENT: well-controlled with oxycodone when necessary.  NARCOTICS INDUCED CONSTIPATION: None  LIVING WILL AND CODE STATUS: Full code.  INTERVAL HISTORY: Beth [name] 52 y.o. female returns to the clinic today for follow up visit. The patient is feeling fine today with no specific complaints except for nausea and some increased and her baseline diarrhea after eating some [place] food. Otherwise her diarrhea related to her [name] therapy is well-controlled with Imodium.. She is tolerating her treatment with [name] fairly well with no significant adverse effects. She has no significant diarrhea but very mild skin rash but the patient denied having any chest pain, shortness breath, cough or hemoptysis. She requests a refill for her oxycodone and also would like something to do with the nausea which tends to occur a bit more frequently. She is hoping to hear about a new job opportunity in the next day or 2 that is different than the initial one that required such late afternoon appointments.  MEDICAL HISTORY: Past Medical History  Diagnosis Date  .  Asthma   . GERD (gastroesophageal reflux disease)   . Hypertension   . Heart murmur   . Fibromyalgia   . Arthritis of knee, degenerative   . Endocarditis     as teenager  . Complication of anesthesia     difficulty waking up  . Shortness of breath   . Lung mass     R- ADENOCARCINOMA  . Sleep apnea     no longer using CPAP  . Pleural effusion 08/30/11  . 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 Anxiety   . Depression   . History of radiation therapy 09/29/11-11/04/2011    right lung 2700cGy 15 sessions    ALLERGIES:  is allergic to meloxicam.  MEDICATIONS:  Current Outpatient Prescriptions  Medication Sig Dispense Refill  . cyclobenzaprine (FLEXERIL) 5 MG tablet TAKE 1 TABLET BY MOUTH ONCE DAILY AS NEEDED FOR MUSCLE SPASMS  30 tablet  1  . diphenhydrAMINE (BENADRYL) 25 mg capsule One to two tabs every 6 hours prn allergies  120 capsule  3  . erlotinib ([name]) 150 MG tablet Take 1 tablet (150 mg total) by mouth daily.  30 tablet  2  . Fe Fum-FePoly-Vit C-Vit B3 (INTEGRA) 62.5-62.5-40-3 MG CAPS Take 1 tablet by mouth daily.      . fish oil-omega-3 fatty acids 1000 MG capsule Take 2 g by mouth daily.      . hydrochlorothiazide (HYDRODIURIL) 25 MG tablet TAKE ONE TABLET EVERY DAY  30 tablet  5  . loperamide (IMODIUM) 2 MG capsule Take 2 mg by mouth 4 (four) times daily as needed for diarrhea or loose stools.      Marland Kitchen  Multiple Vitamins-Minerals (MULTIVITAMIN WITH MINERALS) tablet Take 1 tablet by mouth daily.      Marland Kitchen oxycodone (OXY-IR) 5 MG capsule 1 tab every 6 hours as needed for pain  60 capsule  0  . pantoprazole (PROTONIX) 40 MG tablet Take 1 tablet (40 mg total) by mouth daily.  90 tablet  1  . prochlorperazine (COMPAZINE) 10 MG tablet Take 1 tablet (10 mg total) by mouth every 6 (six) hours as needed.  30 tablet  1  . venlafaxine XR (EFFEXOR-XR) 150 MG 24 hr capsule TAKE ONE CAPSULE BY MOUTH EVERY DAY  90 capsule  1  . Vitamin D, Ergocalciferol, (DRISDOL) 50000 UNITS CAPS  Take 1 capsule (50,000 Units total) by mouth every 7 (seven) days. On wednesdays  12 capsule  3   No current facility-administered medications for this visit.    SURGICAL HISTORY:  Past Surgical History  Procedure Laterality Date  . Tubal ligation  2002  . Thoracentesis  09/02/11, 09/09/11    right-side pleural effusion    REVIEW OF SYSTEMS:  A comprehensive review of systems was negative except for: Gastrointestinal: positive for diarrhea and nausea   PHYSICAL EXAMINATION: General appearance: alert, cooperative and no distress Head: Normocephalic, without obvious abnormality, atraumatic Neck: no adenopathy Lymph nodes: Cervical, supraclavicular, and axillary nodes normal. Resp: clear to auscultation bilaterally Cardio: regular rate and rhythm, S1, S2 normal, no murmur, click, rub or gallop GI: soft, non-tender; bowel sounds normal; no masses,  no organomegaly Extremities: extremities normal, atraumatic, no cyanosis or edema Neurologic: Alert and oriented X 3, normal strength and tone. Normal symmetric reflexes. Normal coordination and gait  ECOG PERFORMANCE STATUS: 1 - Symptomatic but completely ambulatory  Blood pressure 140/99, pulse 91, temperature 98.1 F (36.7 C), temperature source Oral, resp. rate 18, height 5\' 6"  (1.676 m), weight 248 lb 14.4 oz (112.9 kg). Review of the patient's blood pressure dating back to February reveals systolic blood pressure in the range of 139-160 and diastolic blood pressure range from 94 -110  LABORATORY DATA: Lab Results  Component Value Date   WBC 5.4 11/23/2012   HGB 12.3 11/23/2012   HCT 37.2 11/23/2012   MCV 96.3 11/23/2012   PLT 234 11/23/2012      Chemistry      Component Value Date/Time   NA 142 11/23/2012 1614   NA 139 05/23/2012 1305   K 3.9 11/23/2012 1614   K 4.0 05/23/2012 1305   CL 104 08/18/2012 1154   CL 100 05/23/2012 1305   CO2 28 11/23/2012 1614   CO2 30 05/23/2012 1305   BUN 12.7 11/23/2012 1614   BUN 16 05/23/2012 1305   CREATININE 0.8  11/23/2012 1614   CREATININE 0.74 05/23/2012 1305      Component Value Date/Time   CALCIUM 9.3 11/23/2012 1614   CALCIUM 9.0 05/23/2012 1305   ALKPHOS 89 11/23/2012 1614   ALKPHOS 113 05/23/2012 1305   AST 26 11/23/2012 1614   AST 15 05/23/2012 1305   ALT 20 11/23/2012 1614   ALT 7 05/23/2012 1305   BILITOT 0.56 11/23/2012 1614   BILITOT 0.6 05/23/2012 1305       RADIOGRAPHIC STUDIES: No results found.  ASSESSMENT AND PLAN: this is a very pleasant 52 years old African American female with metastatic non-small cell lung cancer, adenocarcinoma with EGFR mutation at exon 75 diagnosed in June of 2013 and currently undergoing treatment with oral [name] status post 13 months.The patient is tolerating her treatment fairly well with no significant  adverse effects. Her last CT scan of the chest abdomen and pelvis did not reveal anything significant disease progression and her extensive bone metastasis was stable.the patient was discussed with him also seen by Dr. Arbutus Ped. She will continue on her [name] at 150 mg by mouth daily she will continue on his depot 120 mg subcutaneously given every 4 weeks for her bony disease. She is given a refill prescription for her oxycodone tablets and for her nausea she is given a prescription for Compazine 10 mg by mouth every 6 hours as needed for nausea total 30 with 1 refill. She'll followup one month with repeat CBC differential and C. met for another symptom management visit.  Laural Benes, Beth Sockwell E, PA-C   The patient was advised to call immediately if she has any concerning symptoms in the interval.  The patient voices understanding of current disease status and treatment options and is in agreement with the current care plan.  All questions were answered. The patient knows to call the clinic with any problems, questions or concerns. We can certainly see the patient much sooner if necessary.  ADDENDUM:  Hematology/Oncology Attending:  I have face to face encounter with the  patient. I recommended her care plan. The patient is doing fine today with no specific complaints. She is currently on treatment with [name] 150 mg by mouth daily for metastatic non-small cell lung cancer, adenocarcinoma with positive EGFR mutation exon 19. She is tolerating her treatment fairly well with no significant adverse effects. She will also continue on Xgeva every 4 weeks to the metastatic bone disease. The patient would come back for followup visit in one month's for reevaluation. Lajuana Matte., MD 11/25/2012

## 2012-11-24 ENCOUNTER — Telehealth: Payer: Self-pay | Admitting: Internal Medicine

## 2012-11-24 NOTE — Telephone Encounter (Signed)
returned pt call and advised on Sept and oct appt...pt ok and aware

## 2012-11-24 NOTE — Patient Instructions (Addendum)
Continue taking [name] 150 mg by mouth daily Continue monthly Xgeva injections as scheduled Follow up in one month for another symptom management visit

## 2012-11-29 ENCOUNTER — Telehealth: Payer: Self-pay | Admitting: Family Medicine

## 2012-11-29 ENCOUNTER — Ambulatory Visit: Payer: BC Managed Care – PPO | Admitting: Family Medicine

## 2012-11-29 NOTE — Telephone Encounter (Signed)
If the patient wants the 4pm slot that's fine and if she needs to have the pap done to. The 4:30 slot should be blocked.

## 2012-11-29 NOTE — Telephone Encounter (Signed)
Pt had to cancel appt today and wants to come in tomorrow. Pt states Dr Shirline Frees wants her to see Dr Caryl Never due to her high BP asap, but she is also scheduled for a pap same time.  No 30 min appts on thurs. Pt needs the 4pm slot. Pt insisted I ask  if you could work in on thurs. pls advise.

## 2012-11-30 ENCOUNTER — Ambulatory Visit: Payer: BC Managed Care – PPO | Admitting: Family Medicine

## 2012-11-30 NOTE — Telephone Encounter (Signed)
done

## 2012-12-01 ENCOUNTER — Ambulatory Visit (HOSPITAL_BASED_OUTPATIENT_CLINIC_OR_DEPARTMENT_OTHER): Payer: BC Managed Care – PPO

## 2012-12-01 ENCOUNTER — Telehealth: Payer: Self-pay | Admitting: Family Medicine

## 2012-12-01 VITALS — BP 164/96 | HR 88 | Temp 98.5°F

## 2012-12-01 DIAGNOSIS — C7951 Secondary malignant neoplasm of bone: Secondary | ICD-10-CM

## 2012-12-01 DIAGNOSIS — C3490 Malignant neoplasm of unspecified part of unspecified bronchus or lung: Secondary | ICD-10-CM

## 2012-12-01 DIAGNOSIS — C3400 Malignant neoplasm of unspecified main bronchus: Secondary | ICD-10-CM

## 2012-12-01 MED ORDER — DENOSUMAB 120 MG/1.7ML ~~LOC~~ SOLN
120.0000 mg | Freq: Once | SUBCUTANEOUS | Status: AC
  Administered 2012-12-01: 120 mg via SUBCUTANEOUS
  Filled 2012-12-01: qty 1.7

## 2012-12-01 NOTE — Telephone Encounter (Signed)
I would not recommend increasing HCTZ before her appointment on 12/04/2012. She should be evaluated promptly through emergency room if she has any recurrent chest pains

## 2012-12-01 NOTE — Telephone Encounter (Signed)
Patient Information:  Caller Name: Beth [name]  Phone: [phone]  Patient: Beth [name], Beth [name]  Gender: Female  DOB: 1960-09-23  Age: 52 Years  PCP: Evelena Peat [facility])  Pregnant: No  Office Follow Up:  Does the office need to follow up with this patient?: Yes  Instructions For The Office: See RN Note  RN Note:  Pt calling about elevated BP over several months.  Current BP 160/94 w/ no other sxs at time of call.  Pt states she had some chest discomfort upon waking on 9-12.  Pt takes her daily HCTZ 25mg  as prescribed.  Pt has appt w/ Dr Caryl Never on 9-15.  Advised Pt to call back or be seen over weekend if she developed chest pain, SOB, headache, vision changes, numbness/weakness.  Pt verbalized understanding. PLEASE REVIEW W/ DR Caryl Never AND F/U W/ PT IF MD FEELS PT SHOULD INCREASE HCTZ BEFORE APPT ON 9-15 PER PT'S REQUEST.  Symptoms  Reason For Call & Symptoms: Elevated BP  Reviewed Health History In EMR: Yes  Reviewed Medications In EMR: Yes  Reviewed Allergies In EMR: Yes  Reviewed Surgeries / Procedures: Yes  Date of Onset of Symptoms: 04/28/2012  Treatments Tried: HCTZ 25mg   Treatments Tried Worked: No OB / GYN:  LMP: Unknown  Guideline(s) Used:  High Blood Pressure  Disposition Per Guideline:   See Within 2 Weeks in Office  Reason For Disposition Reached:   BP > 140/90 and is taking BP medications  Advice Given:  N/A  Patient Will Follow Care Advice:  YES

## 2012-12-01 NOTE — Telephone Encounter (Signed)
Left detailed message on patient VM per Dr. Caryl Never

## 2012-12-04 ENCOUNTER — Encounter: Payer: Self-pay | Admitting: Family Medicine

## 2012-12-04 ENCOUNTER — Ambulatory Visit (INDEPENDENT_AMBULATORY_CARE_PROVIDER_SITE_OTHER): Payer: BC Managed Care – PPO | Admitting: Family Medicine

## 2012-12-04 ENCOUNTER — Other Ambulatory Visit: Payer: Self-pay

## 2012-12-04 ENCOUNTER — Other Ambulatory Visit (HOSPITAL_COMMUNITY)
Admission: RE | Admit: 2012-12-04 | Discharge: 2012-12-04 | Disposition: A | Discharge status: Home / Self Care | Payer: BC Managed Care – PPO | Source: Ambulatory Visit | Attending: Family Medicine | Admitting: Family Medicine

## 2012-12-04 VITALS — BP 122/80 | HR 88 | Temp 98.0°F | Wt 250.0 lb

## 2012-12-04 DIAGNOSIS — Z124 Encounter for screening for malignant neoplasm of cervix: Secondary | ICD-10-CM

## 2012-12-04 DIAGNOSIS — I10 Essential (primary) hypertension: Secondary | ICD-10-CM

## 2012-12-04 DIAGNOSIS — Z23 Encounter for immunization: Secondary | ICD-10-CM

## 2012-12-04 DIAGNOSIS — Z01419 Encounter for gynecological examination (general) (routine) without abnormal findings: Secondary | ICD-10-CM | POA: Insufficient documentation

## 2012-12-04 MED ORDER — VENLAFAXINE HCL ER 150 MG PO CP24: ORAL_CAPSULE | ORAL | Status: DC

## 2012-12-04 NOTE — Patient Instructions (Addendum)
Schedule mammogram.

## 2012-12-04 NOTE — Progress Notes (Signed)
  Subjective:    Patient ID: Beth [name], female    DOB: 1961/03/22, 51 y.o.   MRN: [id number]  HPI Patient has history of metastatic adenocarcinoma lung She's been very stable for several months now. Followed closely by oncology. She is here today to discuss the following issues:  Elevated blood pressure. Currently takes HCTZ regularly. Not monitoring blood pressures at home. No headaches. No dizziness. She had couple recent elevated readings reportedly at oncology office. Initial blood pressure today here 140/90 and repeat left arm seated at rest 122/80. Compliant with therapy.  Patient requesting Pap smear. She does have metastatic adenocarcinoma but this has been stable. She's not had recent mammogram. Appetite and weight are stable.  Past Medical History  Diagnosis Date  . Asthma   . GERD (gastroesophageal reflux disease)   . Hypertension   . Heart murmur   . Fibromyalgia   . Arthritis of knee, degenerative   . Endocarditis     as teenager  . Complication of anesthesia     difficulty waking up  . Shortness of breath   . Lung mass     R- ADENOCARCINOMA  . Sleep apnea     no longer using CPAP  . Pleural effusion 08/30/11  . 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 Anxiety   . Depression   . History of radiation therapy 09/29/11-11/04/2011    right lung 2700cGy 15 sessions   Past Surgical History  Procedure Laterality Date  . Tubal ligation  2002  . Thoracentesis  09/02/11, 09/09/11    right-side pleural effusion    reports that she quit smoking about 23 months ago. Her smoking use included Cigarettes. She has a 7.5 pack-year smoking history. She does not have any smokeless tobacco history on file. She reports that she does not drink alcohol or use illicit drugs. family history includes Alcohol abuse in her father; Hypertension in her maternal aunt, maternal grandfather, maternal grandmother, maternal uncle, and mother; Sarcoidosis in her sister. Allergies   Allergen Reactions  . Meloxicam     Possible GI bleed      Review of Systems  Constitutional: Positive for fatigue. Negative for fever, chills and appetite change.  Eyes: Negative for visual disturbance.  Respiratory: Negative for cough, chest tightness, shortness of breath and wheezing.   Cardiovascular: Negative for chest pain, palpitations and leg swelling.  Neurological: Negative for dizziness, seizures, syncope, weakness, light-headedness and headaches.       Objective:   Physical Exam  Constitutional: She appears well-developed and well-nourished.  Cardiovascular: Normal rate and regular rhythm.   Pulmonary/Chest: Effort normal and breath sounds normal. No respiratory distress. She has no wheezes. She has no rales.  Genitourinary: Vagina normal and uterus normal. No vaginal discharge found.  Breasts symmetric with no mass          Assessment & Plan:  #1 hypertension. Currently stable on HCTZ. We've not recommended additional medications today based on resting blood pressure left arm seated 122/80 #2 health maintenance. Long discussion regarding pros and cons of Pap smear and  mammogram. Patient requesting the above. Pap obtained,  She will set up mammogram.

## 2012-12-05 ENCOUNTER — Telehealth: Payer: Self-pay | Admitting: Family Medicine

## 2012-12-05 NOTE — Telephone Encounter (Signed)
Ok to send paperwork by mail!

## 2012-12-07 ENCOUNTER — Ambulatory Visit: Payer: BC Managed Care – PPO | Admitting: Family Medicine

## 2012-12-07 ENCOUNTER — Telehealth: Payer: Self-pay | Admitting: Family Medicine

## 2012-12-07 NOTE — Telephone Encounter (Signed)
Mailed letter 12/07/2013 @ 9:00AM

## 2012-12-07 NOTE — Telephone Encounter (Signed)
Patient wants to know what Dr. Caryl Never decided regarding whether she should have colonoscopy.

## 2012-12-08 NOTE — Telephone Encounter (Signed)
Left detailed message per. Dr. Caryl Never

## 2012-12-08 NOTE — Telephone Encounter (Signed)
At this point, would not pursue (unless her oncologist decides he thinks she should have).  I would favor less invasive hemoccult cards-which we can give.

## 2012-12-20 ENCOUNTER — Telehealth: Payer: Self-pay | Admitting: Medical Oncology

## 2012-12-20 ENCOUNTER — Other Ambulatory Visit: Payer: Self-pay | Admitting: Medical Oncology

## 2012-12-20 DIAGNOSIS — C3490 Malignant neoplasm of unspecified part of unspecified bronchus or lung: Secondary | ICD-10-CM

## 2012-12-21 NOTE — Telephone Encounter (Signed)
ONC TX scheduled request sent to r/s appts per pt.

## 2012-12-22 ENCOUNTER — Telehealth: Payer: Self-pay | Admitting: Internal Medicine

## 2012-12-22 NOTE — Telephone Encounter (Signed)
returned pt call and lvm advising on Oct appts.Marland KitchenMarland KitchenMarland Kitchen

## 2012-12-25 ENCOUNTER — Ambulatory Visit: Payer: BC Managed Care – PPO | Admitting: Internal Medicine

## 2012-12-25 ENCOUNTER — Other Ambulatory Visit: Payer: Self-pay | Admitting: *Deleted

## 2012-12-25 MED ORDER — OXYCODONE HCL 5 MG PO CAPS: ORAL_CAPSULE | ORAL | Status: DC

## 2013-01-01 ENCOUNTER — Encounter (INDEPENDENT_AMBULATORY_CARE_PROVIDER_SITE_OTHER): Payer: Self-pay

## 2013-01-01 ENCOUNTER — Ambulatory Visit (HOSPITAL_BASED_OUTPATIENT_CLINIC_OR_DEPARTMENT_OTHER): Payer: BC Managed Care – PPO | Admitting: Internal Medicine

## 2013-01-01 ENCOUNTER — Ambulatory Visit (HOSPITAL_BASED_OUTPATIENT_CLINIC_OR_DEPARTMENT_OTHER): Payer: BC Managed Care – PPO

## 2013-01-01 ENCOUNTER — Encounter: Payer: Self-pay | Admitting: Internal Medicine

## 2013-01-01 ENCOUNTER — Other Ambulatory Visit (HOSPITAL_BASED_OUTPATIENT_CLINIC_OR_DEPARTMENT_OTHER): Payer: BC Managed Care – PPO | Admitting: Lab

## 2013-01-01 VITALS — BP 145/99 | HR 105 | Temp 98.3°F | Resp 20 | Ht 66.0 in | Wt 257.4 lb

## 2013-01-01 DIAGNOSIS — C3400 Malignant neoplasm of unspecified main bronchus: Secondary | ICD-10-CM

## 2013-01-01 DIAGNOSIS — C3490 Malignant neoplasm of unspecified part of unspecified bronchus or lung: Secondary | ICD-10-CM

## 2013-01-01 DIAGNOSIS — C7951 Secondary malignant neoplasm of bone: Secondary | ICD-10-CM

## 2013-01-01 DIAGNOSIS — R197 Diarrhea, unspecified: Secondary | ICD-10-CM

## 2013-01-01 DIAGNOSIS — C3491 Malignant neoplasm of unspecified part of right bronchus or lung: Secondary | ICD-10-CM

## 2013-01-01 LAB — CBC WITH DIFFERENTIAL/PLATELET
EOS%: 1.3 % (ref 0.0–7.0)
Eosinophils Absolute: 0.1 10*3/uL (ref 0.0–0.5)
HCT: 34.6 % — ABNORMAL LOW (ref 34.8–46.6)
LYMPH%: 30.3 % (ref 14.0–49.7)
MCH: 32.3 pg (ref 25.1–34.0)
MCHC: 33.4 g/dL (ref 31.5–36.0)
MCV: 96.9 fL (ref 79.5–101.0)
MONO#: 0.7 10*3/uL (ref 0.1–0.9)
MONO%: 9.3 % (ref 0.0–14.0)
NEUT#: 4.2 10*3/uL (ref 1.5–6.5)
NEUT%: 58.2 % (ref 38.4–76.8)
Platelets: 257 10*3/uL (ref 145–400)
RBC: 3.57 10*6/uL — ABNORMAL LOW (ref 3.70–5.45)

## 2013-01-01 LAB — COMPREHENSIVE METABOLIC PANEL (CC13)
Alkaline Phosphatase: 90 U/L (ref 40–150)
Anion Gap: 11 mEq/L (ref 3–11)
CO2: 25 mEq/L (ref 22–29)
Creatinine: 0.9 mg/dL (ref 0.6–1.1)
Glucose: 80 mg/dl (ref 70–140)
Sodium: 140 mEq/L (ref 136–145)
Total Bilirubin: 0.56 mg/dL (ref 0.20–1.20)
Total Protein: 7.9 g/dL (ref 6.4–8.3)

## 2013-01-01 MED ORDER — DENOSUMAB 120 MG/1.7ML ~~LOC~~ SOLN
120.0000 mg | Freq: Once | SUBCUTANEOUS | Status: AC
  Administered 2013-01-01: 120 mg via SUBCUTANEOUS
  Filled 2013-01-01: qty 1.7

## 2013-01-01 MED ORDER — INTEGRA 62.5-62.5-40-3 MG PO CAPS: 1.0000 | ORAL_CAPSULE | Freq: Every day | ORAL | Status: DC

## 2013-01-01 NOTE — Progress Notes (Signed)
[facility] Telephone:([phone]   Fax:(336) [phone]  OFFICE PROGRESS NOTE  [name], MD [address] [place] [address]  DIAGNOSIS AND STAGE: Metastatic non-small cell lung cancer, adenocarcinoma with positive EGFR mutation in exon 19 diagnosed in June of 2013   PRIOR THERAPY: palliative radiotherapy to the right lung under the care of Dr. Dayton Scrape expected to be completed on 10/29/2011.   CURRENT THERAPY:  1. [name] 150 mg by mouth daily started 10/11/2011. Status post 14 months of therapy 2. Xgeva 120 mg subcutaneously every 4 weeks.  CHEMOTHERAPY INTENT: Palliative  CURRENT # OF CHEMOTHERAPY CYCLES: 15  CURRENT ANTIEMETICS: Compazine  CURRENT SMOKING STATUS: Former smoker but quit.  ORAL CHEMOTHERAPY AND CONSENT: Oral [name]  CURRENT BISPHOSPHONATES USE: Xgeva  PAIN MANAGEMENT: well-controlled with oxycodone when necessary.  NARCOTICS INDUCED CONSTIPATION: None  LIVING WILL AND CODE STATUS: Full code.   INTERVAL HISTORY: Beth [name] 52 y.o. female returns to the clinic today for followup visit. The patient is feeling fine today with no specific complaints. She denied having any significant chest pain, shortness of breath, cough or hemoptysis. She denied having any significant skin rash and only has few episodes of diarrhea every now and then. The patient denied having any significant weight loss or night sweats. She is tolerating her treatment with [name] fairly well.   MEDICAL HISTORY: Past Medical History  Diagnosis Date  . Asthma   . GERD (gastroesophageal reflux disease)   . Hypertension   . Heart murmur   . Fibromyalgia   . Arthritis of knee, degenerative   . Endocarditis     as teenager  . Complication of anesthesia     difficulty waking up  . Shortness of breath   . Lung mass     R- ADENOCARCINOMA  . Sleep apnea     no longer using CPAP  . Pleural effusion 08/30/11  . Osseous metastasis 09/20/11    per PET  scan  . Anxiety   . Depression   . History of radiation therapy 09/29/11-11/04/2011    right lung 2700cGy 15 sessions    ALLERGIES:  is allergic to meloxicam.  MEDICATIONS:  Current Outpatient Prescriptions  Medication Sig Dispense Refill  . cyclobenzaprine (FLEXERIL) 5 MG tablet TAKE 1 TABLET BY MOUTH ONCE DAILY AS NEEDED FOR MUSCLE SPASMS  30 tablet  1  . diphenhydrAMINE (BENADRYL) 25 mg capsule One to two tabs every 6 hours prn allergies  120 capsule  3  . erlotinib ([name]) 150 MG tablet Take 1 tablet (150 mg total) by mouth daily.  30 tablet  2  . Fe Fum-FePoly-Vit C-Vit B3 (INTEGRA) 62.5-62.5-40-3 MG CAPS Take 1 tablet by mouth daily.  30 capsule  3  . fish oil-omega-3 fatty acids 1000 MG capsule Take 2 g by mouth daily.      . hydrochlorothiazide (HYDRODIURIL) 25 MG tablet TAKE ONE TABLET EVERY DAY  30 tablet  5  . loperamide (IMODIUM) 2 MG capsule Take 2 mg by mouth 4 (four) times daily as needed for diarrhea or loose stools.      . Multiple Vitamins-Minerals (MULTIVITAMIN WITH MINERALS) tablet Take 1 tablet by mouth daily.      Marland Kitchen oxycodone (OXY-IR) 5 MG capsule 1 tab every 6 hours as needed for pain  60 capsule  0  . prochlorperazine (COMPAZINE) 10 MG tablet Take 1 tablet (10 mg total) by mouth every 6 (six) hours as needed.  30 tablet  1  .  venlafaxine XR (EFFEXOR-XR) 150 MG 24 hr capsule TAKE ONE CAPSULE BY MOUTH EVERY DAY  30 capsule  5   No current facility-administered medications for this visit.    SURGICAL HISTORY:  Past Surgical History  Procedure Laterality Date  . Tubal ligation  2002  . Thoracentesis  09/02/11, 09/09/11    right-side pleural effusion    REVIEW OF SYSTEMS:  A comprehensive review of systems was negative except for: Gastrointestinal: positive for diarrhea   PHYSICAL EXAMINATION: General appearance: alert, cooperative and no distress Head: Normocephalic, without obvious abnormality, atraumatic Neck: no adenopathy, no JVD, supple, symmetrical,  trachea midline and thyroid not enlarged, symmetric, no tenderness/mass/nodules Lymph nodes: Cervical, supraclavicular, and axillary nodes normal. Resp: diminished breath sounds RLL and dullness to percussion RLL Cardio: regular rate and rhythm, S1, S2 normal, no murmur, click, rub or gallop GI: soft, non-tender; bowel sounds normal; no masses,  no organomegaly Extremities: extremities normal, atraumatic, no cyanosis or edema  ECOG PERFORMANCE STATUS: 1 - Symptomatic but completely ambulatory  Blood pressure 145/99, pulse 105, temperature 98.3 F (36.8 C), temperature source Oral, resp. rate 20, height 5\' 6"  (1.676 m), weight 257 lb 6.4 oz (116.756 kg).  LABORATORY DATA: Lab Results  Component Value Date   WBC 7.2 01/01/2013   HGB 11.5* 01/01/2013   HCT 34.6* 01/01/2013   MCV 96.9 01/01/2013   PLT 257 01/01/2013      Chemistry      Component Value Date/Time   NA 142 11/23/2012 1614   NA 139 05/23/2012 1305   K 3.9 11/23/2012 1614   K 4.0 05/23/2012 1305   CL 104 08/18/2012 1154   CL 100 05/23/2012 1305   CO2 28 11/23/2012 1614   CO2 30 05/23/2012 1305   BUN 12.7 11/23/2012 1614   BUN 16 05/23/2012 1305   CREATININE 0.8 11/23/2012 1614   CREATININE 0.74 05/23/2012 1305      Component Value Date/Time   CALCIUM 9.3 11/23/2012 1614   CALCIUM 9.0 05/23/2012 1305   ALKPHOS 89 11/23/2012 1614   ALKPHOS 113 05/23/2012 1305   AST 26 11/23/2012 1614   AST 15 05/23/2012 1305   ALT 20 11/23/2012 1614   ALT 7 05/23/2012 1305   BILITOT 0.56 11/23/2012 1614   BILITOT 0.6 05/23/2012 1305       RADIOGRAPHIC STUDIES: No results found.  ASSESSMENT AND PLAN: This is a very pleasant 52 years old African American female with metastatic non-small cell lung cancer, adenocarcinoma with positive EGFR mutation in exon 19 currently undergoing systemic treatment with oral [name] status post 14 months of treatment. The patient is tolerating her treatment fairly well with no significant adverse effect except for mild diarrhea.  I  advised the patient to continue her current treatment with [name] at the same dose. I would see her back for followup visit in one month with repeat CT scan of the chest, abdomen and pelvis for restaging of her disease.  For the bone disease the patient will continue on Xgeva 120 mg subcutaneously every 4 weeks.  The patient voices understanding of current disease status and treatment options and is in agreement with the current care plan.  All questions were answered. The patient knows to call the clinic with any problems, questions or concerns. We can certainly see the patient much sooner if necessary.

## 2013-01-01 NOTE — Patient Instructions (Signed)
CURRENT THERAPY:  1. [name] 150 mg by mouth daily started 10/11/2011. Status post 14 months of therapy 2. Xgeva 120 mg subcutaneously every 4 weeks. CHEMOTHERAPY INTENT: Palliative  CURRENT # OF CHEMOTHERAPY CYCLES: 15  CURRENT ANTIEMETICS: Compazine  CURRENT SMOKING STATUS: Former smoker but quit.  ORAL CHEMOTHERAPY AND CONSENT: Oral [name]  CURRENT BISPHOSPHONATES USE: Xgeva  PAIN MANAGEMENT: well-controlled with oxycodone when necessary.  NARCOTICS INDUCED CONSTIPATION: None  LIVING WILL AND CODE STATUS: Full code.

## 2013-01-02 ENCOUNTER — Other Ambulatory Visit: Payer: Self-pay | Admitting: *Deleted

## 2013-01-02 DIAGNOSIS — C3490 Malignant neoplasm of unspecified part of unspecified bronchus or lung: Secondary | ICD-10-CM

## 2013-01-02 MED ORDER — ERLOTINIB HCL 150 MG PO TABS: 150.0000 mg | ORAL_TABLET | Freq: Every day | ORAL | Status: DC

## 2013-01-03 ENCOUNTER — Telehealth: Payer: Self-pay | Admitting: Internal Medicine

## 2013-01-03 NOTE — Telephone Encounter (Signed)
lmonvm advising the pt of her nov 11th appts and she will pick up more schedules at that time.

## 2013-01-12 ENCOUNTER — Telehealth: Payer: Self-pay | Admitting: Internal Medicine

## 2013-01-12 NOTE — Telephone Encounter (Signed)
lvm on home # other numbers are not effective...also lvm on [phone] the # she call from to advised that appts were moved after 3pm per pt reques

## 2013-01-22 ENCOUNTER — Other Ambulatory Visit: Payer: Self-pay | Admitting: *Deleted

## 2013-01-22 MED ORDER — OXYCODONE HCL 5 MG PO CAPS: ORAL_CAPSULE | ORAL | Status: DC

## 2013-01-30 ENCOUNTER — Ambulatory Visit (HOSPITAL_COMMUNITY)
Admission: RE | Admit: 2013-01-30 | Discharge: 2013-01-30 | Disposition: A | Discharge status: Home / Self Care | Payer: BC Managed Care – PPO | Source: Ambulatory Visit | Attending: Internal Medicine | Admitting: Internal Medicine

## 2013-01-30 ENCOUNTER — Ambulatory Visit (HOSPITAL_COMMUNITY): Payer: BC Managed Care – PPO

## 2013-01-30 ENCOUNTER — Other Ambulatory Visit: Payer: BC Managed Care – PPO | Admitting: Lab

## 2013-01-30 ENCOUNTER — Other Ambulatory Visit (HOSPITAL_BASED_OUTPATIENT_CLINIC_OR_DEPARTMENT_OTHER): Payer: BC Managed Care – PPO | Admitting: Lab

## 2013-01-30 ENCOUNTER — Encounter (HOSPITAL_COMMUNITY): Payer: Self-pay

## 2013-01-30 DIAGNOSIS — C7951 Secondary malignant neoplasm of bone: Secondary | ICD-10-CM | POA: Insufficient documentation

## 2013-01-30 DIAGNOSIS — R160 Hepatomegaly, not elsewhere classified: Secondary | ICD-10-CM | POA: Insufficient documentation

## 2013-01-30 DIAGNOSIS — I517 Cardiomegaly: Secondary | ICD-10-CM | POA: Insufficient documentation

## 2013-01-30 DIAGNOSIS — Q791 Other congenital malformations of diaphragm: Secondary | ICD-10-CM | POA: Insufficient documentation

## 2013-01-30 DIAGNOSIS — C3490 Malignant neoplasm of unspecified part of unspecified bronchus or lung: Secondary | ICD-10-CM | POA: Insufficient documentation

## 2013-01-30 DIAGNOSIS — Z79899 Other long term (current) drug therapy: Secondary | ICD-10-CM | POA: Insufficient documentation

## 2013-01-30 DIAGNOSIS — Z923 Personal history of irradiation: Secondary | ICD-10-CM | POA: Insufficient documentation

## 2013-01-30 DIAGNOSIS — R1084 Generalized abdominal pain: Secondary | ICD-10-CM | POA: Insufficient documentation

## 2013-01-30 DIAGNOSIS — D259 Leiomyoma of uterus, unspecified: Secondary | ICD-10-CM | POA: Insufficient documentation

## 2013-01-30 DIAGNOSIS — R222 Localized swelling, mass and lump, trunk: Secondary | ICD-10-CM | POA: Insufficient documentation

## 2013-01-30 DIAGNOSIS — C3400 Malignant neoplasm of unspecified main bronchus: Secondary | ICD-10-CM

## 2013-01-30 DIAGNOSIS — K5730 Diverticulosis of large intestine without perforation or abscess without bleeding: Secondary | ICD-10-CM | POA: Insufficient documentation

## 2013-01-30 LAB — COMPREHENSIVE METABOLIC PANEL (CC13)
ALT: 15 U/L (ref 0–55)
Albumin: 3.8 g/dL (ref 3.5–5.0)
Alkaline Phosphatase: 83 U/L (ref 40–150)
Anion Gap: 9 mEq/L (ref 3–11)
BUN: 17.3 mg/dL (ref 7.0–26.0)
CO2: 28 mEq/L (ref 22–29)
Calcium: 9.2 mg/dL (ref 8.4–10.4)
Chloride: 101 mEq/L (ref 98–109)
Creatinine: 0.8 mg/dL (ref 0.6–1.1)
Potassium: 4.2 mEq/L (ref 3.5–5.1)
Sodium: 139 mEq/L (ref 136–145)
Total Bilirubin: 0.47 mg/dL (ref 0.20–1.20)

## 2013-01-30 LAB — CBC WITH DIFFERENTIAL/PLATELET
BASO%: 0.6 % (ref 0.0–2.0)
Basophils Absolute: 0 10*3/uL (ref 0.0–0.1)
EOS%: 1.9 % (ref 0.0–7.0)
HCT: 37 % (ref 34.8–46.6)
HGB: 12.2 g/dL (ref 11.6–15.9)
LYMPH%: 31.5 % (ref 14.0–49.7)
MCH: 32.5 pg (ref 25.1–34.0)
MCHC: 32.9 g/dL (ref 31.5–36.0)
NEUT%: 57.6 % (ref 38.4–76.8)
Platelets: 278 10*3/uL (ref 145–400)
RBC: 3.75 10*6/uL (ref 3.70–5.45)
lymph#: 1.8 10*3/uL (ref 0.9–3.3)

## 2013-01-30 MED ORDER — IOHEXOL 300 MG/ML  SOLN
100.0000 mL | Freq: Once | INTRAMUSCULAR | Status: AC | PRN
  Administered 2013-01-30: 100 mL via INTRAVENOUS

## 2013-01-31 ENCOUNTER — Ambulatory Visit (HOSPITAL_BASED_OUTPATIENT_CLINIC_OR_DEPARTMENT_OTHER): Payer: BC Managed Care – PPO | Admitting: Internal Medicine

## 2013-01-31 ENCOUNTER — Encounter: Payer: Self-pay | Admitting: Internal Medicine

## 2013-01-31 ENCOUNTER — Other Ambulatory Visit: Payer: Self-pay | Admitting: Internal Medicine

## 2013-01-31 ENCOUNTER — Ambulatory Visit (HOSPITAL_BASED_OUTPATIENT_CLINIC_OR_DEPARTMENT_OTHER): Payer: BC Managed Care – PPO

## 2013-01-31 VITALS — BP 152/92 | HR 75 | Temp 98.0°F | Resp 20 | Ht 66.0 in | Wt 253.2 lb

## 2013-01-31 DIAGNOSIS — C3400 Malignant neoplasm of unspecified main bronchus: Secondary | ICD-10-CM

## 2013-01-31 DIAGNOSIS — R197 Diarrhea, unspecified: Secondary | ICD-10-CM

## 2013-01-31 DIAGNOSIS — C3491 Malignant neoplasm of unspecified part of right bronchus or lung: Secondary | ICD-10-CM

## 2013-01-31 DIAGNOSIS — C7951 Secondary malignant neoplasm of bone: Secondary | ICD-10-CM

## 2013-01-31 MED ORDER — DENOSUMAB 120 MG/1.7ML ~~LOC~~ SOLN
120.0000 mg | Freq: Once | SUBCUTANEOUS | Status: AC
  Administered 2013-01-31: 120 mg via SUBCUTANEOUS
  Filled 2013-01-31: qty 1.7

## 2013-01-31 NOTE — Patient Instructions (Signed)
CURRENT THERAPY:  1. [name] 150 mg by mouth daily started 10/11/2011. Status post 15 months of therapy 2. Xgeva 120 mg subcutaneously every 4 weeks. CHEMOTHERAPY INTENT: Palliative  CURRENT # OF CHEMOTHERAPY CYCLES: 16  CURRENT ANTIEMETICS: Compazine  CURRENT SMOKING STATUS: Former smoker but quit.  ORAL CHEMOTHERAPY AND CONSENT: Oral [name]  CURRENT BISPHOSPHONATES USE: Xgeva  PAIN MANAGEMENT: well-controlled with oxycodone when necessary.  NARCOTICS INDUCED CONSTIPATION: None  LIVING WILL AND CODE STATUS: Full code.

## 2013-01-31 NOTE — Progress Notes (Signed)
[facility] Telephone:(336) [phone]   Fax:(336) [phone]  OFFICE PROGRESS NOTE  Beth Covey, Beth [name] [address] [place] [address]  DIAGNOSIS AND STAGE: Metastatic non-small cell lung cancer, adenocarcinoma with positive EGFR mutation in exon 19 diagnosed in June of 2013   PRIOR THERAPY: palliative radiotherapy to the right lung under the care of Dr. Dayton Scrape expected to be completed on 10/29/2011.   CURRENT THERAPY:  1. [name] 150 mg by mouth daily started 10/11/2011. Status post 15 months of therapy 2. Xgeva 120 mg subcutaneously every 4 weeks.  CHEMOTHERAPY INTENT: Palliative  CURRENT # OF CHEMOTHERAPY CYCLES: 16  CURRENT ANTIEMETICS: Compazine  CURRENT SMOKING STATUS: Former smoker but quit.  ORAL CHEMOTHERAPY AND CONSENT: Oral [name]  CURRENT BISPHOSPHONATES USE: Xgeva  PAIN MANAGEMENT: well-controlled with oxycodone when necessary.  NARCOTICS INDUCED CONSTIPATION: None  LIVING WILL AND CODE STATUS: Full code.   INTERVAL HISTORY: Beth [name] 52 y.o. female returns to the clinic today for followup visit. The patient is feeling fine today with no specific complaints. She denied having any significant chest pain, shortness of breath, cough or hemoptysis. She denied having any significant skin rash and only has few episodes of diarrhea every now and then. The patient denied having any significant weight loss or night sweats. She is tolerating her treatment with [name] fairly well. She had pain and swelling in the fourth right toe. The patient had repeat CT scan of the chest, abdomen and pelvis performed recently and she is here for evaluation and discussion of her scan results.  MEDICAL HISTORY: Past Medical History  Diagnosis Date  . Asthma   . GERD (gastroesophageal reflux disease)   . Hypertension   . Heart murmur   . Fibromyalgia   . Arthritis of knee, degenerative   . Endocarditis     as teenager  . Complication of anesthesia      difficulty waking up  . Shortness of breath   . Lung mass     R- ADENOCARCINOMA  . Sleep apnea     no longer using CPAP  . Pleural effusion 08/30/11  . 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 Anxiety   . Depression   . History of radiation therapy 09/29/11-11/04/2011    right lung 2700cGy 15 sessions    ALLERGIES:  is allergic to meloxicam.  MEDICATIONS:  Current Outpatient Prescriptions  Medication Sig Dispense Refill  . cyclobenzaprine (FLEXERIL) 5 MG tablet TAKE 1 TABLET BY MOUTH ONCE DAILY AS NEEDED FOR MUSCLE SPASMS  30 tablet  1  . diphenhydrAMINE (BENADRYL) 25 mg capsule One to two tabs every 6 hours prn allergies  120 capsule  3  . erlotinib ([name]) 150 MG tablet Take 1 tablet (150 mg total) by mouth daily.  30 tablet  2  . Fe Fum-FePoly-Vit C-Vit B3 (INTEGRA) 62.5-62.5-40-3 MG CAPS Take 1 tablet by mouth daily.  30 capsule  3  . fish oil-omega-3 fatty acids 1000 MG capsule Take 2 g by mouth daily.      . hydrochlorothiazide (HYDRODIURIL) 25 MG tablet TAKE ONE TABLET EVERY DAY  30 tablet  5  . loperamide (IMODIUM) 2 MG capsule Take 2 mg by mouth 4 (four) times daily as needed for diarrhea or loose stools.      . Multiple Vitamins-Minerals (MULTIVITAMIN WITH MINERALS) tablet Take 1 tablet by mouth daily.      Marland Kitchen oxycodone (OXY-IR) 5 MG capsule 1 tab every 6  hours as needed for pain  60 capsule  0  . prochlorperazine (COMPAZINE) 10 MG tablet Take 1 tablet (10 mg total) by mouth every 6 (six) hours as needed.  30 tablet  1  . venlafaxine XR (EFFEXOR-XR) 150 MG 24 hr capsule TAKE ONE CAPSULE BY MOUTH EVERY DAY  30 capsule  5   No current facility-administered medications for this visit.   Facility-Administered Medications Ordered in Other Visits  Medication Dose Route Frequency Provider Last Rate Last Dose  . denosumab (XGEVA) injection 120 mg  120 mg Subcutaneous Once Si Gaul, Beth [name]        SURGICAL HISTORY:  Past Surgical History  Procedure Laterality Date    . Tubal ligation  2002  . Thoracentesis  09/02/11, 09/09/11    right-side pleural effusion    REVIEW OF SYSTEMS:  Constitutional: negative Eyes: negative Ears, nose, mouth, throat, and face: negative Respiratory: positive for dyspnea on exertion Cardiovascular: negative Gastrointestinal: negative Genitourinary:negative Integument/breast: negative Hematologic/lymphatic: negative Musculoskeletal:negative Neurological: negative Behavioral/Psych: negative Endocrine: negative Allergic/Immunologic: negative   PHYSICAL EXAMINATION: General appearance: alert, cooperative and no distress Head: Normocephalic, without obvious abnormality, atraumatic Neck: no adenopathy, no JVD, supple, symmetrical, trachea midline and thyroid not enlarged, symmetric, no tenderness/mass/nodules Lymph nodes: Cervical, supraclavicular, and axillary nodes normal. Resp: diminished breath sounds RLL and dullness to percussion RLL Back: symmetric, no curvature. ROM normal. No CVA tenderness. Cardio: regular rate and rhythm, S1, S2 normal, no murmur, click, rub or gallop GI: soft, non-tender; bowel sounds normal; no masses,  no organomegaly Extremities: extremities normal, atraumatic, no cyanosis or edema. There is darkening of the skin and swelling of the right fourth toe. Neurologic: Alert and oriented X 3, normal strength and tone. Normal symmetric reflexes. Normal coordination and gait  ECOG PERFORMANCE STATUS: 1 - Symptomatic but completely ambulatory  Blood pressure 152/92, pulse 75, temperature 98 F (36.7 C), temperature source Oral, resp. rate 20, height 5\' 6"  (1.676 m), weight 253 lb 3.2 oz (114.851 kg).  LABORATORY DATA: Lab Results  Component Value Date   WBC 5.8 01/30/2013   HGB 12.2 01/30/2013   HCT 37.0 01/30/2013   MCV 98.7 01/30/2013   PLT 278 01/30/2013      Chemistry      Component Value Date/Time   NA 139 01/30/2013 1533   NA 139 05/23/2012 1305   K 4.2 01/30/2013 1533   K 4.0  05/23/2012 1305   CL 104 08/18/2012 1154   CL 100 05/23/2012 1305   CO2 28 01/30/2013 1533   CO2 30 05/23/2012 1305   BUN 17.3 01/30/2013 1533   BUN 16 05/23/2012 1305   CREATININE 0.8 01/30/2013 1533   CREATININE 0.74 05/23/2012 1305      Component Value Date/Time   CALCIUM 9.2 01/30/2013 1533   CALCIUM 9.0 05/23/2012 1305   ALKPHOS 83 01/30/2013 1533   ALKPHOS 113 05/23/2012 1305   AST 25 01/30/2013 1533   AST 15 05/23/2012 1305   ALT 15 01/30/2013 1533   ALT 7 05/23/2012 1305   BILITOT 0.47 01/30/2013 1533   BILITOT 0.6 05/23/2012 1305       RADIOGRAPHIC STUDIES: Ct Chest W Contrast  01/30/2013   CLINICAL DATA:  Stage IV adenocarcinoma of lung. Diagnosed 6/13. Radiation therapy complete. Chemotherapy in progress. Diffuse abdominal pain.  EXAM: CT CHEST, ABDOMEN, AND PELVIS WITH CONTRAST  TECHNIQUE: Multidetector CT imaging of the chest, abdomen and pelvis was performed following the standard protocol during bolus administration of intravenous contrast.  CONTRAST:  OMNIPAQUE IOHEXOL 300 MG/ML  SOLN  COMPARISON:  10/26/2012  FINDINGS:   CT CHEST FINDINGS  Lungs/Pleura: Similar tracheal deviation to the right and volume loss throughout the right hemi thorax. Favor scarring at the right apex superiorly on image 18, similar.  Anterior right lung base/ right middle lobe mass which measures 4.1 x 2.5 cm on image 33. Compare 4.7 x 2.5 cm at the same level on the prior exam.  Right lung base nodule which measures 2.9 x 1.6 cm on image 44 versus 3.2 x 1.6 cm on the prior exam.  Clear left lung. 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  Circumferential right-sided pleural thickening and minimal loculated fluid again identified. The fluid volume is minimally decreased.  Heart/Mediastinum: No supraclavicular adenopathy. Cardiomegaly. No pericardial effusion. No central pulmonary embolism, on this non-dedicated study. No mediastinal or definite hilar adenopathy.     CT ABDOMEN AND PELVIS FINDINGS  Abdomen/Pelvis: Hepatomegaly, similar. A too small to characterize high left hepatic lobe lesion on image 52/series 3. Relatively well-circumscribed, likely similar when comparing today's coronal image to that of the prior. Favor a tiny cyst  Similar appearance of an area of hypoattenuation adjacent to falciform ligament. 1.7 cm on image 62. Normal spleen, stomach, pancreas, gallbladder, biliary tract, adrenal glands, kidneys.  No retroperitoneal or retrocrural adenopathy. Moderate amount of stool within the rectum and sigmoid. Scattered diverticula throughout the remainder of the colon. Ileocecal junction within the left paracentral abdomen. Presumably displaced by the [facility] hernia superiorly and leftward. This is chronic.  No bowel obstruction. No ascites. No evidence of omental or peritoneal disease. No pelvic adenopathy. Normal urinary bladder. Uterine enlargement likely due to underlying fibroids. No adnexal mass or significant free fluid.  Bones/Musculoskeletal: Diffuse sclerotic osseous metastasis. Similar in configuration and severity.    IMPRESSION: CT CHEST IMPRESSION  1. Similar to minimally decreased size of right-sided pulmonary nodule and mass. No evidence of new or progressive disease. 2. Redemonstration of right-sided pleural thickening with minimally decreased loculated pleural fluid. 3. Nonobstructive bowel containing right-sided Morgagni hernia. 4. Similar osseous metastasis.    CT ABDOMEN AND PELVIS IMPRESSION  1. No evidence of extraosseous metastasis within the abdomen or pelvis. Similar osseous metastasis. 2. Similar appearance of low-density focus within the liver, adjacent to falciform ligament. Favor focal steatosis. 3. Anatomic distortion of the colon, secondary to Morgagni hernia. 4. Fibroid uterus. Resolution of previously described left adnexal cystic lesion. 5.  Possible constipation.   Electronically Signed   By: Jeronimo Greaves M.D.   On:  01/30/2013 17:25   ASSESSMENT AND PLAN: This is a very pleasant 52 years old [place] American female with metastatic non-small cell lung cancer, adenocarcinoma with positive EGFR mutation in exon 19 currently undergoing systemic treatment with oral [name] status post 15 months of treatment. The patient is tolerating her treatment fairly well with no significant adverse effect except for mild diarrhea.  Her recent scan showed no evidence for disease progression. I discussed the scan results with the patient. I recommended for her to continue on [name] 150 mg by mouth daily. For the swelling of the fourth right toe, will continue to monitor this closely and the patient may need treatment with antibiotics if it gets worse. For the bone disease the patient will continue on Xgeva 120 mg subcutaneously every 4 weeks. The patient voices understanding of current disease status and treatment options and is in agreement with the current care plan.  All questions were answered.  The patient knows to call the clinic with any problems, questions or concerns. We can certainly see the patient much sooner if necessary.  I spent 15 minutes counseling the patient face to face. The total time spent in the appointment was 25 minutes.

## 2013-02-01 ENCOUNTER — Telehealth: Payer: Self-pay | Admitting: Internal Medicine

## 2013-02-01 ENCOUNTER — Ambulatory Visit: Payer: BC Managed Care – PPO | Admitting: Internal Medicine

## 2013-02-01 ENCOUNTER — Ambulatory Visit: Payer: BC Managed Care – PPO

## 2013-02-01 NOTE — Telephone Encounter (Signed)
lmonvm advising the pt of her lab,md and injection appt on 02/28/2013@3 :15pm.

## 2013-02-13 ENCOUNTER — Other Ambulatory Visit: Payer: Self-pay | Admitting: Family Medicine

## 2013-02-20 ENCOUNTER — Other Ambulatory Visit: Payer: Self-pay | Admitting: Medical Oncology

## 2013-02-20 MED ORDER — OXYCODONE HCL 5 MG PO CAPS: ORAL_CAPSULE | ORAL | Status: DC

## 2013-02-20 NOTE — Telephone Encounter (Signed)
rx locked in injection room. 

## 2013-02-21 ENCOUNTER — Telehealth: Payer: Self-pay | Admitting: Internal Medicine

## 2013-02-21 NOTE — Telephone Encounter (Signed)
returned pt call and lvm advised on r./s to 12.22 per pt request b/w 1and 2pm..Marland Kitchen

## 2013-02-28 ENCOUNTER — Other Ambulatory Visit: Payer: BC Managed Care – PPO | Admitting: Lab

## 2013-02-28 ENCOUNTER — Ambulatory Visit: Payer: BC Managed Care – PPO

## 2013-02-28 ENCOUNTER — Ambulatory Visit: Payer: BC Managed Care – PPO | Admitting: Internal Medicine

## 2013-03-12 ENCOUNTER — Ambulatory Visit: Payer: BC Managed Care – PPO | Admitting: Internal Medicine

## 2013-03-12 ENCOUNTER — Telehealth: Payer: Self-pay | Admitting: Internal Medicine

## 2013-03-12 ENCOUNTER — Ambulatory Visit: Payer: BC Managed Care – PPO

## 2013-03-12 ENCOUNTER — Other Ambulatory Visit: Payer: BC Managed Care – PPO

## 2013-03-12 ENCOUNTER — Other Ambulatory Visit: Payer: Self-pay | Admitting: *Deleted

## 2013-03-12 NOTE — Progress Notes (Signed)
Pt called and left msg wanting to r/s her appt for today to next Monday 12/29.  Onc tx schedule sent.  SLJ

## 2013-03-12 NOTE — Telephone Encounter (Signed)
returned pt call and lvm advised on appt moved to 12.29.14 per request adn d/t.

## 2013-03-14 ENCOUNTER — Telehealth: Payer: Self-pay | Admitting: Medical Oncology

## 2013-03-14 NOTE — Telephone Encounter (Signed)
Needs updated application for statement of necessity . They will fax form.

## 2013-03-14 NOTE — Telephone Encounter (Addendum)
I mailed the [name] access to care pt income information to [employer] for her to complete, sign and return. Statement of med necessity given to Dr Arbutus Ped . Both need to be faxed together when completed.

## 2013-03-19 ENCOUNTER — Ambulatory Visit (HOSPITAL_BASED_OUTPATIENT_CLINIC_OR_DEPARTMENT_OTHER): Payer: BC Managed Care – PPO | Admitting: Internal Medicine

## 2013-03-19 ENCOUNTER — Encounter: Payer: Self-pay | Admitting: Internal Medicine

## 2013-03-19 ENCOUNTER — Other Ambulatory Visit (HOSPITAL_BASED_OUTPATIENT_CLINIC_OR_DEPARTMENT_OTHER): Payer: BC Managed Care – PPO

## 2013-03-19 ENCOUNTER — Ambulatory Visit (HOSPITAL_BASED_OUTPATIENT_CLINIC_OR_DEPARTMENT_OTHER): Payer: BC Managed Care – PPO

## 2013-03-19 DIAGNOSIS — C3400 Malignant neoplasm of unspecified main bronchus: Secondary | ICD-10-CM

## 2013-03-19 DIAGNOSIS — C7951 Secondary malignant neoplasm of bone: Secondary | ICD-10-CM

## 2013-03-19 DIAGNOSIS — R197 Diarrhea, unspecified: Secondary | ICD-10-CM

## 2013-03-19 DIAGNOSIS — C3491 Malignant neoplasm of unspecified part of right bronchus or lung: Secondary | ICD-10-CM

## 2013-03-19 LAB — COMPREHENSIVE METABOLIC PANEL (CC13)
Albumin: 3.6 g/dL (ref 3.5–5.0)
Alkaline Phosphatase: 84 U/L (ref 40–150)
Anion Gap: 11 mEq/L (ref 3–11)
BUN: 11 mg/dL (ref 7.0–26.0)
CO2: 25 mEq/L (ref 22–29)
Calcium: 9.3 mg/dL (ref 8.4–10.4)
Chloride: 104 mEq/L (ref 98–109)
Creatinine: 0.9 mg/dL (ref 0.6–1.1)
Glucose: 90 mg/dl (ref 70–140)
Potassium: 4 mEq/L (ref 3.5–5.1)
Sodium: 141 mEq/L (ref 136–145)

## 2013-03-19 LAB — CBC WITH DIFFERENTIAL/PLATELET
Basophils Absolute: 0.1 10*3/uL (ref 0.0–0.1)
Eosinophils Absolute: 0.1 10*3/uL (ref 0.0–0.5)
HCT: 39 % (ref 34.8–46.6)
HGB: 13 g/dL (ref 11.6–15.9)
MCH: 32.9 pg (ref 25.1–34.0)
MCV: 98.6 fL (ref 79.5–101.0)
MONO#: 0.4 10*3/uL (ref 0.1–0.9)
NEUT#: 3.1 10*3/uL (ref 1.5–6.5)
NEUT%: 57.8 % (ref 38.4–76.8)
RDW: 14.2 % (ref 11.2–14.5)
WBC: 5.3 10*3/uL (ref 3.9–10.3)
lymph#: 1.7 10*3/uL (ref 0.9–3.3)

## 2013-03-19 MED ORDER — DENOSUMAB 120 MG/1.7ML ~~LOC~~ SOLN
120.0000 mg | Freq: Once | SUBCUTANEOUS | Status: AC
  Administered 2013-03-19: 120 mg via SUBCUTANEOUS
  Filled 2013-03-19: qty 1.7

## 2013-03-19 MED ORDER — OXYCODONE HCL 5 MG PO CAPS: ORAL_CAPSULE | ORAL | Status: DC

## 2013-03-19 NOTE — Telephone Encounter (Signed)
Per patient, income information faxed to access to care, faxed medical necessity form to access to care.  SLJ

## 2013-03-19 NOTE — Progress Notes (Signed)
[facility] [phone]   [phone]  OFFICE PROGRESS NOTE  [name], MD [address] [place] [address]  DIAGNOSIS AND STAGE: Metastatic non-small cell lung cancer, adenocarcinoma with positive EGFR mutation in exon 19 diagnosed in June of 2013   PRIOR THERAPY: palliative radiotherapy to the right lung under the care of Dr. Dayton Scrape expected to be completed on 10/29/2011.   CURRENT THERAPY:  1. [name] 150 mg by mouth daily started 10/11/2011. Status post 15 months of therapy 2. Xgeva 120 mg subcutaneously every 4 weeks.  CHEMOTHERAPY INTENT: Palliative  CURRENT # OF CHEMOTHERAPY CYCLES: 16  CURRENT ANTIEMETICS: Compazine  CURRENT SMOKING STATUS: Former smoker but quit.  ORAL CHEMOTHERAPY AND CONSENT: Oral [name]  CURRENT BISPHOSPHONATES USE: Xgeva  PAIN MANAGEMENT: well-controlled with oxycodone when necessary.  NARCOTICS INDUCED CONSTIPATION: None  LIVING WILL AND CODE STATUS: Full code.   INTERVAL HISTORY: Beth [name] 52 y.o. female returns to the clinic today for followup visit. The patient is feeling fine today with no specific complaints. She denied having any significant chest pain, shortness of breath, cough or hemoptysis. She denied having any significant skin rash and only has few episodes of diarrhea every now and then. The patient denied having any significant weight loss or night sweats. She is tolerating her treatment with [name] fairly well. She is seen today to receive her Xgeva injection and have repeat lab work for evaluation of her treatment.  MEDICAL HISTORY: Past Medical History  Diagnosis Date  . Asthma   . GERD (gastroesophageal reflux disease)   . Hypertension   . Heart murmur   . Fibromyalgia   . Arthritis of knee, degenerative   . Endocarditis     as teenager  . Complication of anesthesia     difficulty waking up  . Shortness of breath   . Lung mass     R- ADENOCARCINOMA  .  Sleep apnea     no longer using CPAP  . Pleural effusion 08/30/11  . 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 Anxiety   . Depression   . History of radiation therapy 09/29/11-11/04/2011    right lung 2700cGy 15 sessions    ALLERGIES:  is allergic to meloxicam.  MEDICATIONS:  Current Outpatient Prescriptions  Medication Sig Dispense Refill  . cyclobenzaprine (FLEXERIL) 5 MG tablet TAKE 1 TABLET BY MOUTH ONCE DAILY AS NEEDED FOR MUSCLE SPASMS  30 tablet  2  . diphenhydrAMINE (BENADRYL) 25 mg capsule One to two tabs every 6 hours prn allergies  120 capsule  3  . erlotinib ([name]) 150 MG tablet Take 1 tablet (150 mg total) by mouth daily.  30 tablet  2  . Fe Fum-FePoly-Vit C-Vit B3 (INTEGRA) 62.5-62.5-40-3 MG CAPS Take 1 tablet by mouth daily.  30 capsule  3  . fish oil-omega-3 fatty acids 1000 MG capsule Take 2 g by mouth daily.      . hydrochlorothiazide (HYDRODIURIL) 25 MG tablet TAKE ONE TABLET EVERY DAY  30 tablet  5  . loperamide (IMODIUM) 2 MG capsule Take 2 mg by mouth 4 (four) times daily as needed for diarrhea or loose stools.      . Multiple Vitamins-Minerals (MULTIVITAMIN WITH MINERALS) tablet Take 1 tablet by mouth daily.      Marland Kitchen oxycodone (OXY-IR) 5 MG capsule 1 tab every 6 hours as needed for pain  60 capsule  0  . prochlorperazine (COMPAZINE) 10 MG tablet Take  1 tablet (10 mg total) by mouth every 6 (six) hours as needed.  30 tablet  1  . venlafaxine XR (EFFEXOR-XR) 150 MG 24 hr capsule TAKE ONE CAPSULE BY MOUTH EVERY DAY  30 capsule  5   No current facility-administered medications for this visit.    SURGICAL HISTORY:  Past Surgical History  Procedure Laterality Date  . Tubal ligation  2002  . Thoracentesis  09/02/11, 09/09/11    right-side pleural effusion    REVIEW OF SYSTEMS:  Constitutional: negative Eyes: negative Ears, nose, mouth, throat, and face: negative Respiratory: positive for dyspnea on exertion Cardiovascular: negative Gastrointestinal:  negative Genitourinary:negative Integument/breast: negative Hematologic/lymphatic: negative Musculoskeletal:negative Neurological: negative Behavioral/Psych: negative Endocrine: negative Allergic/Immunologic: negative   PHYSICAL EXAMINATION: General appearance: alert, cooperative and no distress Head: Normocephalic, without obvious abnormality, atraumatic Neck: no adenopathy, no JVD, supple, symmetrical, trachea midline and thyroid not enlarged, symmetric, no tenderness/mass/nodules Lymph nodes: Cervical, supraclavicular, and axillary nodes normal. Resp: diminished breath sounds RLL and dullness to percussion RLL Back: symmetric, no curvature. ROM normal. No CVA tenderness. Cardio: regular rate and rhythm, S1, S2 normal, no murmur, click, rub or gallop GI: soft, non-tender; bowel sounds normal; no masses,  no organomegaly Extremities: extremities normal, atraumatic, no cyanosis or edema. There is darkening of the skin and swelling of the right fourth toe. Neurologic: Alert and oriented X 3, normal strength and tone. Normal symmetric reflexes. Normal coordination and gait  ECOG PERFORMANCE STATUS: 1 - Symptomatic but completely ambulatory  Blood pressure 159/106, pulse 103, temperature 98.4 F (36.9 C), temperature source Oral, resp. rate 18, height 5\' 6"  (1.676 m), weight 258 lb 8 oz (117.255 kg).  LABORATORY DATA: Lab Results  Component Value Date   WBC 5.3 03/19/2013   HGB 13.0 03/19/2013   HCT 39.0 03/19/2013   MCV 98.6 03/19/2013   PLT 229 03/19/2013      Chemistry      Component Value Date/Time   NA 139 01/30/2013 1533   NA 139 05/23/2012 1305   K 4.2 01/30/2013 1533   K 4.0 05/23/2012 1305   CL 104 08/18/2012 1154   CL 100 05/23/2012 1305   CO2 28 01/30/2013 1533   CO2 30 05/23/2012 1305   BUN 17.3 01/30/2013 1533   BUN 16 05/23/2012 1305   CREATININE 0.8 01/30/2013 1533   CREATININE 0.74 05/23/2012 1305      Component Value Date/Time   CALCIUM 9.2 01/30/2013 1533    CALCIUM 9.0 05/23/2012 1305   ALKPHOS 83 01/30/2013 1533   ALKPHOS 113 05/23/2012 1305   AST 25 01/30/2013 1533   AST 15 05/23/2012 1305   ALT 15 01/30/2013 1533   ALT 7 05/23/2012 1305   BILITOT 0.47 01/30/2013 1533   BILITOT 0.6 05/23/2012 1305       RADIOGRAPHIC STUDIES:  ASSESSMENT AND PLAN: This is a very pleasant 52 years old [place] American female with metastatic non-small cell lung cancer, adenocarcinoma with positive EGFR mutation in exon 19 currently undergoing systemic treatment with oral [name] status post 15 months of treatment. The patient is tolerating her treatment fairly well with no significant adverse effect except for mild diarrhea.  I recommended for her to continue on [name] 150 mg by mouth daily. For the bone disease the patient will continue on Xgeva 120 mg subcutaneously every 4 weeks. The patient voices understanding of current disease status and treatment options and is in agreement with the current care plan.  All questions were answered. The patient knows to  call the clinic with any problems, questions or concerns. We can certainly see the patient much sooner if necessary.

## 2013-03-19 NOTE — Patient Instructions (Signed)
CURRENT THERAPY:  1. [name] 150 mg by mouth daily started 10/11/2011. Status post 15 months of therapy 2. Xgeva 120 mg subcutaneously every 4 weeks. CHEMOTHERAPY INTENT: Palliative  CURRENT # OF CHEMOTHERAPY CYCLES: 16  CURRENT ANTIEMETICS: Compazine  CURRENT SMOKING STATUS: Former smoker but quit.  ORAL CHEMOTHERAPY AND CONSENT: Oral [name]  CURRENT BISPHOSPHONATES USE: Xgeva  PAIN MANAGEMENT: well-controlled with oxycodone when necessary.  NARCOTICS INDUCED CONSTIPATION: None  LIVING WILL AND CODE STATUS: Full code.  

## 2013-03-20 ENCOUNTER — Telehealth: Payer: Self-pay | Admitting: Internal Medicine

## 2013-03-20 NOTE — Telephone Encounter (Signed)
lvm for pt with d/t for Jan appt.Marland Kitchen

## 2013-03-23 ENCOUNTER — Telehealth: Payer: Self-pay | Admitting: Medical Oncology

## 2013-03-23 NOTE — Telephone Encounter (Signed)
Pt has insurance now and access to solutions referred pt to [facility] for tarceva shipment. [employer] rep confirmed from [facility] that hey shipped [place] on 03/21/13 and no copay needed.

## 2013-03-23 NOTE — Telephone Encounter (Signed)
Pt reported she had severe diarrhea for 3 days beginning 03/21/13 and is better today . She needs a note for work. Note sent to front desk.

## 2013-04-16 ENCOUNTER — Ambulatory Visit: Payer: BC Managed Care – PPO | Admitting: Physician Assistant

## 2013-04-16 ENCOUNTER — Other Ambulatory Visit: Payer: BC Managed Care – PPO

## 2013-04-16 ENCOUNTER — Ambulatory Visit: Payer: BC Managed Care – PPO

## 2013-04-17 ENCOUNTER — Telehealth: Payer: Self-pay | Admitting: Internal Medicine

## 2013-04-17 NOTE — Telephone Encounter (Signed)
returned pt called to tele # [phone].Marland KitchenMarland KitchenMarland KitchenMarland Kitchenlvm for pt regarding to appt r/s to tues 2.3.15

## 2013-04-24 ENCOUNTER — Other Ambulatory Visit (HOSPITAL_BASED_OUTPATIENT_CLINIC_OR_DEPARTMENT_OTHER): Payer: BC Managed Care – PPO

## 2013-04-24 ENCOUNTER — Ambulatory Visit (HOSPITAL_BASED_OUTPATIENT_CLINIC_OR_DEPARTMENT_OTHER): Payer: BC Managed Care – PPO

## 2013-04-24 ENCOUNTER — Ambulatory Visit (HOSPITAL_BASED_OUTPATIENT_CLINIC_OR_DEPARTMENT_OTHER): Payer: BC Managed Care – PPO | Admitting: Physician Assistant

## 2013-04-24 ENCOUNTER — Telehealth: Payer: Self-pay | Admitting: Internal Medicine

## 2013-04-24 ENCOUNTER — Encounter: Payer: Self-pay | Admitting: Physician Assistant

## 2013-04-24 ENCOUNTER — Encounter (INDEPENDENT_AMBULATORY_CARE_PROVIDER_SITE_OTHER): Payer: Self-pay

## 2013-04-24 VITALS — BP 128/86 | HR 104 | Temp 97.7°F | Resp 18 | Ht 67.0 in | Wt 257.5 lb

## 2013-04-24 DIAGNOSIS — C3490 Malignant neoplasm of unspecified part of unspecified bronchus or lung: Secondary | ICD-10-CM

## 2013-04-24 DIAGNOSIS — C7952 Secondary malignant neoplasm of bone marrow: Secondary | ICD-10-CM

## 2013-04-24 DIAGNOSIS — C7951 Secondary malignant neoplasm of bone: Secondary | ICD-10-CM

## 2013-04-24 DIAGNOSIS — C3491 Malignant neoplasm of unspecified part of right bronchus or lung: Secondary | ICD-10-CM

## 2013-04-24 DIAGNOSIS — R21 Rash and other nonspecific skin eruption: Secondary | ICD-10-CM

## 2013-04-24 DIAGNOSIS — C3400 Malignant neoplasm of unspecified main bronchus: Secondary | ICD-10-CM

## 2013-04-24 DIAGNOSIS — R197 Diarrhea, unspecified: Secondary | ICD-10-CM

## 2013-04-24 LAB — CBC WITH DIFFERENTIAL/PLATELET
BASO%: 1.3 % (ref 0.0–2.0)
Basophils Absolute: 0.1 10*3/uL (ref 0.0–0.1)
EOS%: 1.9 % (ref 0.0–7.0)
Eosinophils Absolute: 0.1 10*3/uL (ref 0.0–0.5)
HCT: 39.8 % (ref 34.8–46.6)
HGB: 13.2 g/dL (ref 11.6–15.9)
LYMPH%: 34.3 % (ref 14.0–49.7)
MCH: 32.8 pg (ref 25.1–34.0)
MCHC: 33 g/dL (ref 31.5–36.0)
MCV: 99.3 fL (ref 79.5–101.0)
MONO#: 0.5 10*3/uL (ref 0.1–0.9)
MONO%: 9.3 % (ref 0.0–14.0)
NEUT#: 3.1 10*3/uL (ref 1.5–6.5)
NEUT%: 53.2 % (ref 38.4–76.8)
Platelets: 254 10*3/uL (ref 145–400)
RBC: 4.01 10*6/uL (ref 3.70–5.45)
RDW: 14.2 % (ref 11.2–14.5)
WBC: 5.7 10*3/uL (ref 3.9–10.3)
lymph#: 2 10*3/uL (ref 0.9–3.3)

## 2013-04-24 LAB — COMPREHENSIVE METABOLIC PANEL (CC13)
ALBUMIN: 3.7 g/dL (ref 3.5–5.0)
ALT: 15 U/L (ref 0–55)
AST: 18 U/L (ref 5–34)
Alkaline Phosphatase: 99 U/L (ref 40–150)
Anion Gap: 8 mEq/L (ref 3–11)
BUN: 11.3 mg/dL (ref 7.0–26.0)
CO2: 29 mEq/L (ref 22–29)
CREATININE: 0.9 mg/dL (ref 0.6–1.1)
Calcium: 9.2 mg/dL (ref 8.4–10.4)
Chloride: 104 mEq/L (ref 98–109)
GLUCOSE: 97 mg/dL (ref 70–140)
Potassium: 4.4 mEq/L (ref 3.5–5.1)
Sodium: 141 mEq/L (ref 136–145)
Total Bilirubin: 0.47 mg/dL (ref 0.20–1.20)
Total Protein: 7.9 g/dL (ref 6.4–8.3)

## 2013-04-24 MED ORDER — OXYCODONE HCL 5 MG PO CAPS: ORAL_CAPSULE | ORAL | Status: DC

## 2013-04-24 MED ORDER — DENOSUMAB 120 MG/1.7ML ~~LOC~~ SOLN
120.0000 mg | Freq: Once | SUBCUTANEOUS | Status: AC
Start: 2013-04-24 — End: 2013-04-24
  Administered 2013-04-24: 120 mg via SUBCUTANEOUS
  Filled 2013-04-24: qty 1.7

## 2013-04-24 NOTE — Telephone Encounter (Signed)
lvm on # [phone] which is the # she call from regarding to March appt

## 2013-04-24 NOTE — Progress Notes (Addendum)
[facility] [phone]   [phone]  SHARED VISIT PROGRESS NOTE  [name], MD [place] [address]  DIAGNOSIS AND STAGE: Metastatic non-small cell lung cancer, adenocarcinoma with positive EGFR mutation in exon 19 diagnosed in June of 2013   PRIOR THERAPY: palliative radiotherapy to the right lung under the care of Dr. Valere Dross expected to be completed on 10/29/2011.   CURRENT THERAPY:  1. [name] 150 mg by mouth daily started 10/11/2011. Status post 16 months of therapy 2. Xgeva 120 mg subcutaneously every 4 weeks.  CHEMOTHERAPY INTENT: Palliative  CURRENT # OF CHEMOTHERAPY CYCLES: 17  CURRENT ANTIEMETICS: Compazine  CURRENT SMOKING STATUS: Former smoker but quit.  ORAL CHEMOTHERAPY AND CONSENT: Oral [name]  CURRENT BISPHOSPHONATES USE: Xgeva  PAIN MANAGEMENT: well-controlled with oxycodone when necessary.  NARCOTICS INDUCED CONSTIPATION: None  LIVING WILL AND CODE STATUS: Full code.   INTERVAL HISTORY: Beth [name] 53 y.o. female returns to the clinic today for followup visit. The patient is feeling better today with no specific complaints. She recently had to resign from her full-time employment due to adverse effects on her medical condition. She had a significant increase in nausea and diarrhea as well as skin rash. The symptoms have calmed down significantly since she resign. She denied having any significant chest pain, shortness of breath, cough or hemoptysis. The patient denied having any significant weight loss or night sweats. She is tolerating her treatment with [name] fairly well. She is seen today to receive her Xgeva injection and have repeat lab work for evaluation of her treatment.  MEDICAL HISTORY: Past Medical History  Diagnosis Date  . Asthma   . GERD (gastroesophageal reflux disease)   . Hypertension   . Heart murmur   . Fibromyalgia   . Arthritis of knee, degenerative   .  Endocarditis     as teenager  . Complication of anesthesia     difficulty waking up  . Shortness of breath   . Lung mass     R- ADENOCARCINOMA  . Sleep apnea     no longer using CPAP  . Pleural effusion 08/30/11  . 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 Anxiety   . Depression   . History of radiation therapy 09/29/11-11/04/2011    right lung 2700cGy 15 sessions    ALLERGIES:  is allergic to meloxicam.  MEDICATIONS:  Current Outpatient Prescriptions  Medication Sig Dispense Refill  . cyclobenzaprine (FLEXERIL) 5 MG tablet TAKE 1 TABLET BY MOUTH ONCE DAILY AS NEEDED FOR MUSCLE SPASMS  30 tablet  2  . diphenhydrAMINE (BENADRYL) 25 mg capsule One to two tabs every 6 hours prn allergies  120 capsule  3  . erlotinib ([name]) 150 MG tablet Take 1 tablet (150 mg total) by mouth daily.  30 tablet  2  . fish oil-omega-3 fatty acids 1000 MG capsule Take 2 g by mouth daily.      . hydrochlorothiazide (HYDRODIURIL) 25 MG tablet TAKE ONE TABLET EVERY DAY  30 tablet  5  . loperamide (IMODIUM) 2 MG capsule Take 2 mg by mouth 4 (four) times daily as needed for diarrhea or loose stools.      . Multiple Vitamins-Minerals (MULTIVITAMIN WITH MINERALS) tablet Take 1 tablet by mouth daily.      Marland Kitchen oxycodone (OXY-IR) 5 MG capsule 1 tab every 6 hours as needed for pain  60 capsule  0  . prochlorperazine (COMPAZINE) 10 MG  tablet Take 1 tablet (10 mg total) by mouth every 6 (six) hours as needed.  30 tablet  1  . venlafaxine XR (EFFEXOR-XR) 150 MG 24 hr capsule TAKE ONE CAPSULE BY MOUTH EVERY DAY  30 capsule  5  . Fe Fum-FePoly-Vit C-Vit B3 (INTEGRA) 62.5-62.5-40-3 MG CAPS Take 1 tablet by mouth daily.  30 capsule  3   No current facility-administered medications for this visit.    SURGICAL HISTORY:  Past Surgical History  Procedure Laterality Date  . Tubal ligation  2002  . Thoracentesis  09/02/11, 09/09/11    right-side pleural effusion    REVIEW OF SYSTEMS:  Constitutional: negative Eyes:  negative Ears, nose, mouth, throat, and face: negative Respiratory: positive for dyspnea on exertion Cardiovascular: negative Gastrointestinal: positive for diarrhea and nausea Genitourinary:negative Integument/breast: positive for rash Hematologic/lymphatic: negative Musculoskeletal:negative Neurological: negative Behavioral/Psych: negative Endocrine: negative Allergic/Immunologic: negative   PHYSICAL EXAMINATION: General appearance: alert, cooperative and no distress Head: Normocephalic, without obvious abnormality, atraumatic Neck: no adenopathy, no JVD, supple, symmetrical, trachea midline and thyroid not enlarged, symmetric, no tenderness/mass/nodules. There is fullness in the left supraclavicular area without discrete palpable lymphadenopathy, the right supraclavicular area is not similarly effected Lymph nodes: Cervical, supraclavicular, and axillary nodes normal. Resp: diminished breath sounds RLL and dullness to percussion RLL Back: symmetric, no curvature. ROM normal. No CVA tenderness. Cardio: regular rate and rhythm, S1, S2 normal, no murmur, click, rub or gallop GI: soft, non-tender; bowel sounds normal; no masses,  no organomegaly Extremities: extremities normal, atraumatic, no cyanosis or edema. There is darkening of the skin and swelling of the right fourth toe. Neurologic: Alert and oriented X 3, normal strength and tone. Normal symmetric reflexes. Normal coordination and gait  ECOG PERFORMANCE STATUS: 1 - Symptomatic but completely ambulatory  Blood pressure 128/86, pulse 104, temperature 97.7 F (36.5 C), temperature source Oral, resp. rate 18, height _0  (1.702 m), weight 257 lb 8 oz (116.801 kg).  LABORATORY DATA: Lab Results  Component Value Date   WBC 5.7 04/24/2013   HGB 13.2 04/24/2013   HCT 39.8 04/24/2013   MCV 99.3 04/24/2013   PLT 254 04/24/2013      Chemistry      Component Value Date/Time   NA 141 04/24/2013 1447   NA 139 05/23/2012 1305   K 4.4  04/24/2013 1447   K 4.0 05/23/2012 1305   CL 104 08/18/2012 1154   CL 100 05/23/2012 1305   CO2 29 04/24/2013 1447   CO2 30 05/23/2012 1305   BUN 11.3 04/24/2013 1447   BUN 16 05/23/2012 1305   CREATININE 0.9 04/24/2013 1447   CREATININE 0.74 05/23/2012 1305      Component Value Date/Time   CALCIUM 9.2 04/24/2013 1447   CALCIUM 9.0 05/23/2012 1305   ALKPHOS 99 04/24/2013 1447   ALKPHOS 113 05/23/2012 1305   AST 18 04/24/2013 1447   AST 15 05/23/2012 1305   ALT 15 04/24/2013 1447   ALT 7 05/23/2012 1305   BILITOT 0.47 04/24/2013 1447   BILITOT 0.6 05/23/2012 1305       RADIOGRAPHIC STUDIES:  ASSESSMENT AND PLAN: This is a very pleasant 53 years old [place] American female with metastatic non-small cell lung cancer, adenocarcinoma with positive EGFR mutation in exon 19 currently undergoing systemic treatment with oral [name] status post 16 months of treatment. The patient is tolerating her treatment fairly well with no significant adverse effect except for mild diarrhea. The diarrhea and skin rash had significantly increased in related  to being overtaxed with her full-time employment. She has since had to resign due to medical reasons. Globally she is stable and I do believe that she could more easily handle of part-time employment without difficulty. She is to continue to take Imodium as needed for the diarrhea and to monitor her skin rash. The patient was discussed with and also seen by Dr. Julien Nordmann. She will continue on [name] 150 mg by mouth daily as well as Xgeva  injections on a monthly basis for her bone metastasis. She'll followup with Dr. Julien Nordmann in one month with a restaging CT scan of her chest, abdomen and pelvis with contrast to reevaluate her disease. Patient requests a letter for her employer regarding the impact of full -time employment on her condition and this will be provided to her.  The patient voices understanding of current disease status and treatment options and is in agreement with the current care  plan.  All questions were answered. The patient knows to call the clinic with any problems, questions or concerns. We can certainly see the patient much sooner if necessary.  Carlton Adam PA-C  ADDENDUM: Hematology/Oncology Attending: I had a face to face encounter with the patient today. I recommended her care plan. This is a very pleasant 53 years old African American female with metastatic non-small cell lung cancer, adenocarcinoma with positive EGFR mutation in exon 19 currently on treatment with [name] 150 mg by mouth daily status post 16 months of treatment. The patient is tolerating her treatment fairly well with no significant adverse effect except for few episodes of diarrhea recently and she takes Imodium as needed basis. She mentions that she had a lot of stress at her current job and that increased her symptoms from the treatment. She is considering a part-time job. I advised the patient to continue on her current treatment with [name] as scheduled. She will also continue on her monthly injection of Xgeva for the bone metastasis. She would come back for followup visit in one month with repeat CT scan of the chest, abdomen and pelvis for restaging of her disease. She was advised to call immediately if she has any concerning symptoms in the interval.  Disclaimer: This note was dictated with voice recognition software. Similar sounding words can inadvertently be transcribed and may not be corrected upon review.  Eilleen Kempf., MD 04/24/2013

## 2013-04-24 NOTE — Patient Instructions (Signed)
Continue taking [name] 150 mg by mouth daily. Continue monthly Xgeva injections for your bone disease Followup with Dr. Julien Nordmann in one month with a restaging CT scan of your chest, abdomen and pelvis to reevaluate your disease

## 2013-04-27 ENCOUNTER — Other Ambulatory Visit: Payer: Self-pay | Admitting: Physician Assistant

## 2013-04-27 ENCOUNTER — Encounter: Payer: Self-pay | Admitting: Physician Assistant

## 2013-05-18 ENCOUNTER — Ambulatory Visit (HOSPITAL_COMMUNITY): Payer: BC Managed Care – PPO

## 2013-05-21 ENCOUNTER — Telehealth: Payer: Self-pay | Admitting: Medical Oncology

## 2013-05-21 ENCOUNTER — Other Ambulatory Visit: Payer: Self-pay | Admitting: Medical Oncology

## 2013-05-21 DIAGNOSIS — C3490 Malignant neoplasm of unspecified part of unspecified bronchus or lung: Secondary | ICD-10-CM

## 2013-05-21 MED ORDER — OXYCODONE HCL 5 MG PO CAPS: ORAL_CAPSULE | ORAL | Status: DC

## 2013-05-21 NOTE — Telephone Encounter (Signed)
rx locked in injection room.

## 2013-05-21 NOTE — Telephone Encounter (Signed)
Pt  Requests to r/s appt this week to next week.

## 2013-05-22 ENCOUNTER — Telehealth: Payer: Self-pay | Admitting: Internal Medicine

## 2013-05-22 ENCOUNTER — Ambulatory Visit: Payer: BC Managed Care – PPO

## 2013-05-22 ENCOUNTER — Ambulatory Visit (HOSPITAL_COMMUNITY): Admission: RE | Admit: 2013-05-22 | Payer: BC Managed Care – PPO | Source: Ambulatory Visit

## 2013-05-22 ENCOUNTER — Other Ambulatory Visit: Payer: BC Managed Care – PPO

## 2013-05-22 ENCOUNTER — Ambulatory Visit: Payer: BC Managed Care – PPO | Admitting: Internal Medicine

## 2013-05-22 NOTE — Telephone Encounter (Signed)
s.w. pt and advised on March appt moved....done....pt aware of new d.t

## 2013-05-23 ENCOUNTER — Other Ambulatory Visit: Payer: Self-pay | Admitting: Internal Medicine

## 2013-05-25 ENCOUNTER — Other Ambulatory Visit (HOSPITAL_BASED_OUTPATIENT_CLINIC_OR_DEPARTMENT_OTHER): Payer: BC Managed Care – PPO

## 2013-05-25 ENCOUNTER — Encounter (HOSPITAL_COMMUNITY): Payer: Self-pay

## 2013-05-25 ENCOUNTER — Ambulatory Visit (HOSPITAL_COMMUNITY)
Admission: RE | Admit: 2013-05-25 | Discharge: 2013-05-25 | Disposition: A | Discharge status: Home / Self Care | Payer: BC Managed Care – PPO | Source: Ambulatory Visit | Attending: Physician Assistant | Admitting: Physician Assistant

## 2013-05-25 DIAGNOSIS — D259 Leiomyoma of uterus, unspecified: Secondary | ICD-10-CM | POA: Insufficient documentation

## 2013-05-25 DIAGNOSIS — C7952 Secondary malignant neoplasm of bone marrow: Secondary | ICD-10-CM

## 2013-05-25 DIAGNOSIS — C3400 Malignant neoplasm of unspecified main bronchus: Secondary | ICD-10-CM

## 2013-05-25 DIAGNOSIS — C3490 Malignant neoplasm of unspecified part of unspecified bronchus or lung: Secondary | ICD-10-CM | POA: Insufficient documentation

## 2013-05-25 DIAGNOSIS — C7951 Secondary malignant neoplasm of bone: Secondary | ICD-10-CM

## 2013-05-25 LAB — COMPREHENSIVE METABOLIC PANEL (CC13)
ALT: 18 U/L (ref 0–55)
AST: 22 U/L (ref 5–34)
Albumin: 3.6 g/dL (ref 3.5–5.0)
Alkaline Phosphatase: 94 U/L (ref 40–150)
Anion Gap: 10 mEq/L (ref 3–11)
BUN: 16.2 mg/dL (ref 7.0–26.0)
CALCIUM: 9.6 mg/dL (ref 8.4–10.4)
CO2: 26 mEq/L (ref 22–29)
CREATININE: 0.7 mg/dL (ref 0.6–1.1)
Chloride: 104 mEq/L (ref 98–109)
Glucose: 99 mg/dl (ref 70–140)
POTASSIUM: 4.3 meq/L (ref 3.5–5.1)
Sodium: 140 mEq/L (ref 136–145)
Total Bilirubin: 0.58 mg/dL (ref 0.20–1.20)
Total Protein: 7.8 g/dL (ref 6.4–8.3)

## 2013-05-25 LAB — CBC WITH DIFFERENTIAL/PLATELET
BASO%: 0.7 % (ref 0.0–2.0)
Basophils Absolute: 0 10*3/uL (ref 0.0–0.1)
EOS%: 1.1 % (ref 0.0–7.0)
Eosinophils Absolute: 0.1 10*3/uL (ref 0.0–0.5)
HCT: 38.6 % (ref 34.8–46.6)
HGB: 12.7 g/dL (ref 11.6–15.9)
LYMPH#: 1.7 10*3/uL (ref 0.9–3.3)
LYMPH%: 27.2 % (ref 14.0–49.7)
MCH: 32 pg (ref 25.1–34.0)
MCHC: 32.9 g/dL (ref 31.5–36.0)
MCV: 97.1 fL (ref 79.5–101.0)
MONO#: 0.5 10*3/uL (ref 0.1–0.9)
MONO%: 8.4 % (ref 0.0–14.0)
NEUT#: 3.9 10*3/uL (ref 1.5–6.5)
NEUT%: 62.6 % (ref 38.4–76.8)
Platelets: 223 10*3/uL (ref 145–400)
RBC: 3.97 10*6/uL (ref 3.70–5.45)
RDW: 13.9 % (ref 11.2–14.5)
WBC: 6.3 10*3/uL (ref 3.9–10.3)

## 2013-05-25 MED ORDER — IOHEXOL 300 MG/ML  SOLN
125.0000 mL | Freq: Once | INTRAMUSCULAR | Status: AC | PRN
  Administered 2013-05-25: 125 mL via INTRAVENOUS

## 2013-05-28 ENCOUNTER — Ambulatory Visit: Payer: BC Managed Care – PPO | Admitting: Internal Medicine

## 2013-05-28 ENCOUNTER — Telehealth: Payer: Self-pay | Admitting: Internal Medicine

## 2013-05-28 ENCOUNTER — Other Ambulatory Visit: Payer: BC Managed Care – PPO

## 2013-05-28 ENCOUNTER — Ambulatory Visit: Payer: BC Managed Care – PPO

## 2013-05-28 NOTE — Telephone Encounter (Signed)
per Dr. Tyrell Antonio ok to sched with Delos Haring

## 2013-05-30 ENCOUNTER — Ambulatory Visit: Payer: BC Managed Care – PPO

## 2013-05-30 ENCOUNTER — Encounter: Payer: Self-pay | Admitting: Internal Medicine

## 2013-05-30 ENCOUNTER — Ambulatory Visit: Payer: BC Managed Care – PPO | Admitting: Physician Assistant

## 2013-05-30 ENCOUNTER — Telehealth: Payer: Self-pay | Admitting: *Deleted

## 2013-05-30 NOTE — Telephone Encounter (Signed)
Pt called stating she has a family emergency and she will not be able to come to her appts today.  Per dr Vista Mink, okay to delay appt to a later date.  New f/u appt given, pt will call and r/s her inj appt.  SLJ

## 2013-05-30 NOTE — Progress Notes (Signed)
Resent form for to [facility] for [name] asst.

## 2013-06-01 NOTE — Progress Notes (Signed)
Received fax from [place] stating that application (that had originally been sent in Dec 2014) was incomplete.  Form completed per their request and faxed back to [place].  Received call from [place] inquiring why the application had been re-sent in.  Advised they call Thermon Leyland for more information, phone number provided.  Asked for them to call if they have any further questions for Dr [facility] RN.

## 2013-06-13 ENCOUNTER — Encounter: Payer: Self-pay | Admitting: Internal Medicine

## 2013-06-13 NOTE — Progress Notes (Signed)
Per Beth [name] the patient was approved for [name] and has had it shipped to her back in Dec.

## 2013-06-18 ENCOUNTER — Telehealth: Payer: Self-pay | Admitting: Internal Medicine

## 2013-06-18 NOTE — Telephone Encounter (Signed)
returned pt call and added lab and inj....pt aware of appts

## 2013-06-19 ENCOUNTER — Ambulatory Visit: Payer: BC Managed Care – PPO

## 2013-06-19 ENCOUNTER — Ambulatory Visit: Payer: BC Managed Care – PPO | Admitting: Internal Medicine

## 2013-06-19 ENCOUNTER — Other Ambulatory Visit: Payer: Self-pay | Admitting: *Deleted

## 2013-06-19 ENCOUNTER — Telehealth: Payer: Self-pay | Admitting: Internal Medicine

## 2013-06-19 ENCOUNTER — Other Ambulatory Visit: Payer: BC Managed Care – PPO

## 2013-06-19 DIAGNOSIS — C3490 Malignant neoplasm of unspecified part of unspecified bronchus or lung: Secondary | ICD-10-CM

## 2013-06-19 NOTE — Telephone Encounter (Signed)
pt called to r/s appt....MD has no availability...sched with AJ....pt aware of new d.t

## 2013-06-22 ENCOUNTER — Telehealth: Payer: Self-pay | Admitting: Internal Medicine

## 2013-06-22 NOTE — Telephone Encounter (Signed)
returned pt call and r/s appts per pt request...done....pt aware of new d.t

## 2013-06-26 ENCOUNTER — Ambulatory Visit: Payer: BC Managed Care – PPO

## 2013-06-26 ENCOUNTER — Ambulatory Visit: Payer: BC Managed Care – PPO | Admitting: Physician Assistant

## 2013-06-26 ENCOUNTER — Other Ambulatory Visit: Payer: BC Managed Care – PPO

## 2013-06-27 ENCOUNTER — Ambulatory Visit: Payer: BC Managed Care – PPO

## 2013-06-27 ENCOUNTER — Other Ambulatory Visit: Payer: Self-pay | Admitting: *Deleted

## 2013-06-27 ENCOUNTER — Encounter: Payer: Self-pay | Admitting: Physician Assistant

## 2013-06-27 ENCOUNTER — Other Ambulatory Visit (HOSPITAL_BASED_OUTPATIENT_CLINIC_OR_DEPARTMENT_OTHER): Payer: BC Managed Care – PPO

## 2013-06-27 ENCOUNTER — Ambulatory Visit (HOSPITAL_BASED_OUTPATIENT_CLINIC_OR_DEPARTMENT_OTHER): Payer: BC Managed Care – PPO | Admitting: Physician Assistant

## 2013-06-27 ENCOUNTER — Telehealth: Payer: Self-pay | Admitting: Internal Medicine

## 2013-06-27 ENCOUNTER — Ambulatory Visit (HOSPITAL_BASED_OUTPATIENT_CLINIC_OR_DEPARTMENT_OTHER): Payer: BC Managed Care – PPO

## 2013-06-27 VITALS — BP 125/93 | HR 115 | Temp 98.2°F | Resp 18 | Ht 67.0 in | Wt 256.7 lb

## 2013-06-27 DIAGNOSIS — C3490 Malignant neoplasm of unspecified part of unspecified bronchus or lung: Secondary | ICD-10-CM

## 2013-06-27 DIAGNOSIS — R197 Diarrhea, unspecified: Secondary | ICD-10-CM

## 2013-06-27 DIAGNOSIS — C7951 Secondary malignant neoplasm of bone: Secondary | ICD-10-CM

## 2013-06-27 DIAGNOSIS — C7952 Secondary malignant neoplasm of bone marrow: Secondary | ICD-10-CM

## 2013-06-27 DIAGNOSIS — C3400 Malignant neoplasm of unspecified main bronchus: Secondary | ICD-10-CM

## 2013-06-27 LAB — COMPREHENSIVE METABOLIC PANEL (CC13)
ALK PHOS: 102 U/L (ref 40–150)
ALT: 25 U/L (ref 0–55)
AST: 33 U/L (ref 5–34)
Albumin: 3.8 g/dL (ref 3.5–5.0)
Anion Gap: 12 mEq/L — ABNORMAL HIGH (ref 3–11)
BILIRUBIN TOTAL: 0.4 mg/dL (ref 0.20–1.20)
BUN: 10.4 mg/dL (ref 7.0–26.0)
CO2: 24 mEq/L (ref 22–29)
CREATININE: 0.7 mg/dL (ref 0.6–1.1)
Calcium: 9.6 mg/dL (ref 8.4–10.4)
Chloride: 108 mEq/L (ref 98–109)
Glucose: 91 mg/dl (ref 70–140)
Potassium: 4 mEq/L (ref 3.5–5.1)
Sodium: 144 mEq/L (ref 136–145)
Total Protein: 8.1 g/dL (ref 6.4–8.3)

## 2013-06-27 LAB — CBC WITH DIFFERENTIAL/PLATELET
BASO%: 0.8 % (ref 0.0–2.0)
BASOS ABS: 0 10*3/uL (ref 0.0–0.1)
EOS%: 1.1 % (ref 0.0–7.0)
Eosinophils Absolute: 0.1 10*3/uL (ref 0.0–0.5)
HCT: 38.3 % (ref 34.8–46.6)
HEMOGLOBIN: 12.5 g/dL (ref 11.6–15.9)
LYMPH%: 34.4 % (ref 14.0–49.7)
MCH: 31.8 pg (ref 25.1–34.0)
MCHC: 32.6 g/dL (ref 31.5–36.0)
MCV: 97.5 fL (ref 79.5–101.0)
MONO#: 0.4 10*3/uL (ref 0.1–0.9)
MONO%: 6.4 % (ref 0.0–14.0)
NEUT#: 3.5 10*3/uL (ref 1.5–6.5)
NEUT%: 57.3 % (ref 38.4–76.8)
Platelets: 231 10*3/uL (ref 145–400)
RBC: 3.93 10*6/uL (ref 3.70–5.45)
RDW: 14 % (ref 11.2–14.5)
WBC: 6.1 10*3/uL (ref 3.9–10.3)
lymph#: 2.1 10*3/uL (ref 0.9–3.3)

## 2013-06-27 MED ORDER — DENOSUMAB 120 MG/1.7ML ~~LOC~~ SOLN
120.0000 mg | Freq: Once | SUBCUTANEOUS | Status: AC
  Administered 2013-06-27: 120 mg via SUBCUTANEOUS
  Filled 2013-06-27: qty 1.7

## 2013-06-27 MED ORDER — OXYCODONE HCL 5 MG PO CAPS: ORAL_CAPSULE | ORAL | Status: DC

## 2013-06-27 MED ORDER — PROCHLORPERAZINE MALEATE 10 MG PO TABS: 10.0000 mg | ORAL_TABLET | Freq: Four times a day (QID) | ORAL | Status: DC | PRN

## 2013-06-27 NOTE — Progress Notes (Addendum)
[place] [phone]   [phone]  SHARED VISIT PROGRESS NOTE  Beth Post, MD [place] [address]  DIAGNOSIS AND STAGE: Metastatic non-small cell lung cancer, adenocarcinoma with positive EGFR mutation in exon 19 diagnosed in June of 2013   PRIOR THERAPY: palliative radiotherapy to the right lung under the care of Dr. Valere Dross expected to be completed on 10/29/2011.   CURRENT THERAPY:  1. [name] 150 mg by mouth daily started 10/11/2011. Status [name] 18 months of therapy 2. Xgeva 120 mg subcutaneously every 4 weeks.  CHEMOTHERAPY INTENT: Palliative  CURRENT # OF CHEMOTHERAPY CYCLES: 19  CURRENT ANTIEMETICS: Compazine  CURRENT SMOKING STATUS: Former smoker but quit.  ORAL CHEMOTHERAPY AND CONSENT: Oral [name]  CURRENT BISPHOSPHONATES USE: Xgeva  PAIN MANAGEMENT: well-controlled with oxycodone when necessary.  NARCOTICS INDUCED CONSTIPATION: None  LIVING WILL AND CODE STATUS: Full code.   INTERVAL HISTORY: Beth [name] 53 y.o. female returns to the clinic today for followup visit. The patient has no specific complaints. She states that she has been [place] with her son who had to have a nonfunctioning kidney removed. He is doing well and she has been able to return to [place]. She's had some intermittent right forearm pain that may have resulted from some overuse. She is tolerating her [name] therapy well. She recently had a restaging CT scan of her chest, abdomen and pelvis and presents to discuss the results. She is also due for her Xgeva injection today. She denied having any significant chest pain, shortness of breath, cough or hemoptysis. The patient denied having any significant weight loss or night sweats.   MEDICAL HISTORY: Past Medical History  Diagnosis Date  . Asthma   . GERD (gastroesophageal reflux disease)   . Hypertension   . Heart murmur   . Fibromyalgia   . Arthritis of  knee, degenerative   . Endocarditis     as teenager  . Complication of anesthesia     difficulty waking up  . Shortness of breath   . Lung mass     R- ADENOCARCINOMA  . Sleep apnea     no longer using CPAP  . Pleural effusion 08/30/11  . 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 Anxiety   . Depression   . History of radiation therapy 09/29/11-11/04/2011    right lung 2700cGy 15 sessions    ALLERGIES:  is allergic to meloxicam.  MEDICATIONS:  Current Outpatient Prescriptions  Medication Sig Dispense Refill  . cyclobenzaprine (FLEXERIL) 5 MG tablet TAKE 1 TABLET BY MOUTH ONCE DAILY AS NEEDED FOR MUSCLE SPASMS  30 tablet  2  . diphenhydrAMINE (BENADRYL) 25 mg capsule One to two tabs every 6 hours prn allergies  120 capsule  3  . Fe Fum-FePoly-Vit C-Vit B3 (INTEGRA) 62.5-62.5-40-3 MG CAPS Take 1 tablet by mouth daily.  30 capsule  3  . fish oil-omega-3 fatty acids 1000 MG capsule Take 2 g by mouth daily.      . hydrochlorothiazide (HYDRODIURIL) 25 MG tablet TAKE ONE TABLET EVERY DAY  30 tablet  5  . loperamide (IMODIUM) 2 MG capsule Take 2 mg by mouth 4 (four) times daily as needed for diarrhea or loose stools.      . Multiple Vitamins-Minerals (MULTIVITAMIN WITH MINERALS) tablet Take 1 tablet by mouth daily.      Marland Kitchen oxycodone (OXY-IR) 5 MG capsule 1 tab every 6 hours as needed for pain  60 capsule  0  . prochlorperazine (COMPAZINE) 10 MG tablet Take 1 tablet (10 mg total) by mouth every 6 (six) hours as needed.  30 tablet  1  . [name] 150 MG tablet TAKE ONE TABLET DAILY  30 tablet  1  . venlafaxine XR (EFFEXOR-XR) 150 MG 24 hr capsule TAKE ONE CAPSULE BY MOUTH EVERY DAY  30 capsule  5   No current facility-administered medications for this visit.    SURGICAL HISTORY:  Past Surgical History  Procedure Laterality Date  . Tubal ligation  2002  . Thoracentesis  09/02/11, 09/09/11    right-side pleural effusion    REVIEW OF SYSTEMS:  Constitutional: negative Eyes:  negative Ears, nose, mouth, throat, and face: negative Respiratory: positive for dyspnea on exertion Cardiovascular: negative Gastrointestinal: positive for diarrhea and nausea Genitourinary:negative Integument/breast: positive for rash Hematologic/lymphatic: negative Musculoskeletal:negative Neurological: negative Behavioral/Psych: negative Endocrine: negative Allergic/Immunologic: negative   PHYSICAL EXAMINATION: General appearance: alert, cooperative and no distress Head: Normocephalic, without obvious abnormality, atraumatic Neck: no adenopathy, no JVD, supple, symmetrical, trachea midline and thyroid not enlarged, symmetric, no tenderness/mass/nodules. There is fullness in the left supraclavicular area without discrete palpable lymphadenopathy, the right supraclavicular area is not similarly effected Lymph nodes: Cervical, supraclavicular, and axillary nodes normal. Resp: diminished breath sounds RLL and dullness to percussion RLL Back: symmetric, no curvature. ROM normal. No CVA tenderness. Cardio: regular rate and rhythm, S1, S2 normal, no murmur, click, rub or gallop GI: soft, non-tender; bowel sounds normal; no masses,  no organomegaly Extremities: extremities normal, atraumatic, no cyanosis or edema. Neurologic: Alert and oriented X 3, normal strength and tone. Normal symmetric reflexes. Normal coordination and gait  ECOG PERFORMANCE STATUS: 1 - Symptomatic but completely ambulatory  Blood pressure 125/93, pulse 115, temperature 98.2 F (36.8 C), temperature source Oral, resp. rate 18, height '5\' 7"'  (1.702 m), weight 256 lb 11.2 oz (116.438 kg).  LABORATORY DATA: Lab Results  Component Value Date   WBC 6.1 06/27/2013   HGB 12.5 06/27/2013   HCT 38.3 06/27/2013   MCV 97.5 06/27/2013   PLT 231 06/27/2013      Chemistry      Component Value Date/Time   NA 144 06/27/2013 1450   NA 139 05/23/2012 1305   K 4.0 06/27/2013 1450   K 4.0 05/23/2012 1305   CL 104 08/18/2012 1154   CL 100  05/23/2012 1305   CO2 24 06/27/2013 1450   CO2 30 05/23/2012 1305   BUN 10.4 06/27/2013 1450   BUN 16 05/23/2012 1305   CREATININE 0.7 06/27/2013 1450   CREATININE 0.74 05/23/2012 1305      Component Value Date/Time   CALCIUM 9.6 06/27/2013 1450   CALCIUM 9.0 05/23/2012 1305   ALKPHOS 102 06/27/2013 1450   ALKPHOS 113 05/23/2012 1305   AST 33 06/27/2013 1450   AST 15 05/23/2012 1305   ALT 25 06/27/2013 1450   ALT 7 05/23/2012 1305   BILITOT 0.40 06/27/2013 1450   BILITOT 0.6 05/23/2012 1305       RADIOGRAPHIC STUDIES: CLINICAL DATA: Followup lung cancer.  EXAM:  CT CHEST, ABDOMEN, AND PELVIS WITH CONTRAST  TECHNIQUE:  Multidetector CT imaging of the chest, abdomen and pelvis was  performed following the standard protocol during bolus  administration of intravenous contrast.  CONTRAST: 154m OMNIPAQUE IOHEXOL 300 MG/ML SOLN  COMPARISON: 01/30/2013  FINDINGS:  CT CHEST FINDINGS  The heart size is normal. There is no pericardial effusion  identified. No enlarged mediastinal lymph nodes identified. There  is  no axillary or supraclavicular adenopathy.  Postoperative changes and volume loss throughout the right hemi  thorax is again noted. Anterior right lung base mass measures 3.3 x  1.9 cm, image 34/ series 2. Previously this measured 4.1 x 2.5 cm.  Previously referenced nodule in the right lung base measures 2.8 cm,  image 45/ series 2. Previously 2.9 cm. Pleural thickening and  minimally loculated fluid overlying the right lung appears similar  to previous exam. The left lung is clear.  Review of the visualized osseous structures shows diffuse sclerotic  bone metastasis. This appears similar to previous exam.  CT ABDOMEN AND PELVIS FINDINGS  The hepatomegaly appears similar to previous exam. There is mild  diffuse low attenuation within the liver parenchyma compatible with  hepatic steatosis. 7 mm low attenuation structure within the central  right hepatic lobe is unchanged. No new liver abnormality.  The  gallbladder is normal. No biliary dilatation. Normal appearance of  the pancreas. The spleen is unremarkable.  Normal appearance of the adrenal glands. The left kidney is normal.  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 This is  similar to previous exam. Urinary bladder appears normal. Small  uterine fibroids are identified as before.  There is mild calcified atherosclerotic disease involving the  abdominal aorta and its branches. There is no aneurysm. No pelvic or  inguinal adenopathy identified.  The stomach is normal. The small bowel loops have a normal course  and caliber without obstruction. The appendix appears normal. Normal  appearance of the proximal colon. Multiple distal colonic  diverticula are identified.  There is no ascites or focal fluid collections within the abdomen or  pelvis.  Multi focal sclerotic bone metastases are identified. This is  unchanged from the previous exam.  IMPRESSION:  CT chest:  1. Similar to decrease in size of right-sided pulmonary nodule in  mass. No evidence of new or progressive disease.  2. Re- demonstration of right-sided pleural thickening with  minimally loculated pleural fluid.  3. Similar osseous metastasis.  CT abdomen and pelvis:  1. No evidence of extra osseous metastasis within the abdomen or  pelvis.  2. Similar osseous metastasis.  3. Fibroid uterus.  Electronically Signed  By: Kerby Moors M.D.  On: 05/25/2013 15:31    ASSESSMENT AND PLAN: This is a very pleasant 53 years old [place] American female with metastatic non-small cell lung cancer, adenocarcinoma with positive EGFR mutation in exon 19 currently undergoing systemic treatment with oral [name] status [name] 18 months of treatment. The patient is tolerating her treatment fairly well with no significant adverse effect except for mild diarrhea. The patient was discussed with and also seen by Dr. Julien Nordmann. As her restaging CT scan from 05/25/2013  revealed no evidence of new or progressive disease. She will continue on [name] at 150 mg by mouth daily. She will also continue with  Xgeva injections monthly for her bone metastasis. She'll followup in one month with repeat CBC differential and C. met for a symptom management visit.  The patient voices understanding of current disease status and treatment options and is in agreement with the current care plan.  All questions were answered. The patient knows to call the clinic with any problems, questions or concerns. We can certainly see the patient much sooner if necessary.  Carlton Adam, PA-C  ADDENDUM: Hematology/Oncology:  I had a face to face encounter with the patient. I recommended her care plan. This is a very pleasant  54 years old [place] American female with metastatic non-small cell lung cancer, adenocarcinoma with positive EGFR mutation. She is currently on treatment with oral [name] status [name] 18 months of treatment and she is tolerating her treatment fairly well with no significant adverse effects. Her recent CT scan of the chest, abdomen and pelvis showed no evidence for disease progression. I discussed the scan results with the patient today. I recommended for her to continue treatment with [name] with the same dose. She will also continue treatment with Xgeva for the bone metastasis. The patient would come back for followup visit in one month for reevaluation with repeat CBC and comprehensive metabolic panel. She was advised to call immediately if she has any concerning symptoms in the interval.  Disclaimer: This note was dictated with voice recognition software. Similar sounding words can inadvertently be transcribed and may not be corrected upon review. Curt Bears, MD 06/30/2013

## 2013-06-27 NOTE — Telephone Encounter (Signed)
pt cam in and needed to r/s lab and inj to today....done.Marland KitchenMarland KitchenMarland Kitchen

## 2013-06-28 ENCOUNTER — Ambulatory Visit: Payer: BC Managed Care – PPO | Admitting: Physician Assistant

## 2013-06-28 ENCOUNTER — Ambulatory Visit: Payer: BC Managed Care – PPO

## 2013-06-28 ENCOUNTER — Other Ambulatory Visit: Payer: BC Managed Care – PPO

## 2013-06-29 NOTE — Patient Instructions (Signed)
Your CT scan revealed no evidence for disease progression Continue on [name] 150 mg by mouth daily Followup in one month

## 2013-07-11 ENCOUNTER — Encounter: Payer: Self-pay | Admitting: *Deleted

## 2013-07-12 ENCOUNTER — Telehealth: Payer: Self-pay | Admitting: *Deleted

## 2013-07-12 NOTE — Telephone Encounter (Signed)
Pt is requesting a letter stating that her son assists her at home.  Letter left at the front desk for pick up.  SLJ

## 2013-07-14 ENCOUNTER — Other Ambulatory Visit: Payer: Self-pay | Admitting: Family Medicine

## 2013-07-20 ENCOUNTER — Other Ambulatory Visit: Payer: Self-pay | Admitting: Medical Oncology

## 2013-07-20 DIAGNOSIS — C3490 Malignant neoplasm of unspecified part of unspecified bronchus or lung: Secondary | ICD-10-CM

## 2013-07-20 MED ORDER — OXYCODONE HCL 5 MG PO CAPS
ORAL_CAPSULE | ORAL | Status: DC
Start: 2013-07-20 — End: 2013-09-12

## 2013-07-20 MED ORDER — OXYCODONE HCL 5 MG PO CAPS: ORAL_CAPSULE | ORAL | Status: DC

## 2013-07-20 NOTE — Addendum Note (Signed)
Addended by: Ardeen Garland on: 07/20/2013 01:48 PM   Modules accepted: Orders

## 2013-07-20 NOTE — Telephone Encounter (Signed)
rx ready for pt and pt notified.

## 2013-07-23 ENCOUNTER — Other Ambulatory Visit: Payer: Self-pay | Admitting: *Deleted

## 2013-07-23 DIAGNOSIS — C3490 Malignant neoplasm of unspecified part of unspecified bronchus or lung: Secondary | ICD-10-CM

## 2013-07-24 ENCOUNTER — Ambulatory Visit: Payer: BC Managed Care – PPO | Admitting: Internal Medicine

## 2013-07-24 ENCOUNTER — Ambulatory Visit: Payer: BC Managed Care – PPO

## 2013-07-24 ENCOUNTER — Other Ambulatory Visit: Payer: BC Managed Care – PPO

## 2013-07-25 ENCOUNTER — Telehealth: Payer: Self-pay | Admitting: *Deleted

## 2013-07-25 NOTE — Telephone Encounter (Signed)
[facility] for appts on 5/5. Attempted to call patient back to discuss r/s appts.  Unable to leave message on # provided.  SLJ

## 2013-07-26 ENCOUNTER — Telehealth: Payer: Self-pay | Admitting: Internal Medicine

## 2013-07-26 NOTE — Telephone Encounter (Signed)
pt called to r/s missed appt...done...pt ok and aware

## 2013-08-01 ENCOUNTER — Ambulatory Visit: Payer: BC Managed Care – PPO | Admitting: Physician Assistant

## 2013-08-01 ENCOUNTER — Other Ambulatory Visit: Payer: Self-pay | Admitting: Physician Assistant

## 2013-08-01 ENCOUNTER — Ambulatory Visit: Payer: BC Managed Care – PPO

## 2013-08-01 ENCOUNTER — Telehealth: Payer: Self-pay | Admitting: Internal Medicine

## 2013-08-01 ENCOUNTER — Other Ambulatory Visit: Payer: BC Managed Care – PPO

## 2013-08-01 NOTE — Telephone Encounter (Signed)
lvm for pt regarding to today appt moved to 5.20 per pt request..Marland KitchenMarland Kitchen

## 2013-08-08 ENCOUNTER — Encounter: Payer: Self-pay | Admitting: Physician Assistant

## 2013-08-08 ENCOUNTER — Ambulatory Visit (HOSPITAL_BASED_OUTPATIENT_CLINIC_OR_DEPARTMENT_OTHER): Payer: BC Managed Care – PPO | Admitting: Physician Assistant

## 2013-08-08 ENCOUNTER — Other Ambulatory Visit (HOSPITAL_BASED_OUTPATIENT_CLINIC_OR_DEPARTMENT_OTHER): Payer: BC Managed Care – PPO

## 2013-08-08 ENCOUNTER — Ambulatory Visit (HOSPITAL_BASED_OUTPATIENT_CLINIC_OR_DEPARTMENT_OTHER): Payer: BC Managed Care – PPO

## 2013-08-08 VITALS — BP 148/93 | HR 92 | Temp 97.7°F | Resp 18 | Ht 67.0 in | Wt 261.0 lb

## 2013-08-08 DIAGNOSIS — C3490 Malignant neoplasm of unspecified part of unspecified bronchus or lung: Secondary | ICD-10-CM

## 2013-08-08 DIAGNOSIS — C7951 Secondary malignant neoplasm of bone: Secondary | ICD-10-CM

## 2013-08-08 DIAGNOSIS — C7952 Secondary malignant neoplasm of bone marrow: Secondary | ICD-10-CM

## 2013-08-08 DIAGNOSIS — C3400 Malignant neoplasm of unspecified main bronchus: Secondary | ICD-10-CM

## 2013-08-08 DIAGNOSIS — C342 Malignant neoplasm of middle lobe, bronchus or lung: Secondary | ICD-10-CM

## 2013-08-08 DIAGNOSIS — Z9119 Patient's noncompliance with other medical treatment and regimen: Secondary | ICD-10-CM

## 2013-08-08 DIAGNOSIS — Z91199 Patient's noncompliance with other medical treatment and regimen due to unspecified reason: Secondary | ICD-10-CM

## 2013-08-08 LAB — CBC WITH DIFFERENTIAL/PLATELET
BASO%: 1.2 % (ref 0.0–2.0)
BASOS ABS: 0.1 10*3/uL (ref 0.0–0.1)
EOS ABS: 0.1 10*3/uL (ref 0.0–0.5)
EOS%: 1.8 % (ref 0.0–7.0)
HCT: 36.7 % (ref 34.8–46.6)
HEMOGLOBIN: 12 g/dL (ref 11.6–15.9)
LYMPH%: 29.9 % (ref 14.0–49.7)
MCH: 31.8 pg (ref 25.1–34.0)
MCHC: 32.6 g/dL (ref 31.5–36.0)
MCV: 97.4 fL (ref 79.5–101.0)
MONO#: 0.5 10*3/uL (ref 0.1–0.9)
MONO%: 7.8 % (ref 0.0–14.0)
NEUT%: 59.3 % (ref 38.4–76.8)
NEUTROS ABS: 4 10*3/uL (ref 1.5–6.5)
PLATELETS: 236 10*3/uL (ref 145–400)
RBC: 3.77 10*6/uL (ref 3.70–5.45)
RDW: 13.9 % (ref 11.2–14.5)
WBC: 6.7 10*3/uL (ref 3.9–10.3)
lymph#: 2 10*3/uL (ref 0.9–3.3)

## 2013-08-08 LAB — COMPREHENSIVE METABOLIC PANEL (CC13)
ALBUMIN: 3.4 g/dL — AB (ref 3.5–5.0)
ALK PHOS: 83 U/L (ref 40–150)
ALT: 18 U/L (ref 0–55)
AST: 21 U/L (ref 5–34)
Anion Gap: 11 mEq/L (ref 3–11)
BILIRUBIN TOTAL: 0.4 mg/dL (ref 0.20–1.20)
BUN: 17.6 mg/dL (ref 7.0–26.0)
CO2: 27 meq/L (ref 22–29)
Calcium: 10 mg/dL (ref 8.4–10.4)
Chloride: 105 mEq/L (ref 98–109)
Creatinine: 0.9 mg/dL (ref 0.6–1.1)
Glucose: 93 mg/dl (ref 70–140)
POTASSIUM: 4.3 meq/L (ref 3.5–5.1)
Sodium: 143 mEq/L (ref 136–145)
Total Protein: 7.6 g/dL (ref 6.4–8.3)

## 2013-08-08 MED ORDER — DENOSUMAB 120 MG/1.7ML ~~LOC~~ SOLN
120.0000 mg | Freq: Once | SUBCUTANEOUS | Status: AC
  Administered 2013-08-08: 120 mg via SUBCUTANEOUS
  Filled 2013-08-08: qty 1.7

## 2013-08-08 NOTE — Patient Instructions (Signed)
Denosumab injection  What is this medicine?  DENOSUMAB (den oh sue mab) slows bone breakdown. Prolia is used to treat osteoporosis in women after menopause and in men. Xgeva is used to prevent bone fractures and other bone problems caused by cancer bone metastases. Xgeva is also used to treat giant cell tumor of the bone.  This medicine may be used for other purposes; ask your health care provider or pharmacist if you have questions.  COMMON BRAND NAME(S): Prolia, XGEVA  What should I tell my health care provider before I take this medicine?  They need to know if you have any of these conditions:  -dental disease  -eczema  -infection or history of infections  -kidney disease or on dialysis  -low blood calcium or vitamin D  -malabsorption syndrome  -scheduled to have surgery or tooth extraction  -taking medicine that contains denosumab  -thyroid or parathyroid disease  -an unusual reaction to denosumab, other medicines, foods, dyes, or preservatives  -pregnant or trying to get pregnant  -breast-feeding  How should I use this medicine?  This medicine is for injection under the skin. It is given by a health care professional in a hospital or clinic setting.  If you are getting Prolia, a special MedGuide will be given to you by the pharmacist with each prescription and refill. Be sure to read this information carefully each time.  For Prolia, talk to your pediatrician regarding the use of this medicine in children. Special care may be needed. For Xgeva, talk to your pediatrician regarding the use of this medicine in children. While this drug may be prescribed for children as young as 13 years for selected conditions, precautions do apply.  Overdosage: If you think you've taken too much of this medicine contact a poison control center or emergency room at once.  Overdosage: If you think you have taken too much of this medicine contact a poison control center or emergency room at once.  NOTE: This medicine is only for  you. Do not share this medicine with others.  What if I miss a dose?  It is important not to miss your dose. Call your doctor or health care professional if you are unable to keep an appointment.  What may interact with this medicine?  Do not take this medicine with any of the following medications:  -other medicines containing denosumab  This medicine may also interact with the following medications:  -medicines that suppress the immune system  -medicines that treat cancer  -steroid medicines like prednisone or cortisone  This list may not describe all possible interactions. Give your health care provider a list of all the medicines, herbs, non-prescription drugs, or dietary supplements you use. Also tell them if you smoke, drink alcohol, or use illegal drugs. Some items may interact with your medicine.  What should I watch for while using this medicine?  Visit your doctor or health care professional for regular checks on your progress. Your doctor or health care professional may order blood tests and other tests to see how you are doing.  Call your doctor or health care professional if you get a cold or other infection while receiving this medicine. Do not treat yourself. This medicine may decrease your body's ability to fight infection.  You should make sure you get enough calcium and vitamin D while you are taking this medicine, unless your doctor tells you not to. Discuss the foods you eat and the vitamins you take with your health care professional.    See your dentist regularly. Brush and floss your teeth as directed. Before you have any dental work done, tell your dentist you are receiving this medicine.  Do not become pregnant while taking this medicine or for 5 months after stopping it. Women should inform their doctor if they wish to become pregnant or think they might be pregnant. There is a potential for serious side effects to an unborn child. Talk to your health care professional or pharmacist for more  information.  What side effects may I notice from receiving this medicine?  Side effects that you should report to your doctor or health care professional as soon as possible:  -allergic reactions like skin rash, itching or hives, swelling of the face, lips, or tongue  -breathing problems  -chest pain  -fast, irregular heartbeat  -feeling faint or lightheaded, falls  -fever, chills, or any other sign of infection  -muscle spasms, tightening, or twitches  -numbness or tingling  -skin blisters or bumps, or is dry, peels, or red  -slow healing or unexplained pain in the mouth or jaw  -unusual bleeding or bruising  Side effects that usually do not require medical attention (Report these to your doctor or health care professional if they continue or are bothersome.):  -muscle pain  -stomach upset, gas  This list may not describe all possible side effects. Call your doctor for medical advice about side effects. You may report side effects to FDA at 1-800-FDA-1088.  Where should I keep my medicine?  This medicine is only given in a clinic, doctor's office, or other health care setting and will not be stored at home.  NOTE: This sheet is a summary. It may not cover all possible information. If you have questions about this medicine, talk to your doctor, pharmacist, or health care provider.  © 2014, [employer]. (2011-09-06 12:37:47)

## 2013-08-08 NOTE — Progress Notes (Addendum)
[place] [phone]   [phone]  SHARED VISIT PROGRESS NOTE  [name], MD [place] [address]  DIAGNOSIS AND STAGE: Metastatic non-small cell lung cancer, adenocarcinoma with positive EGFR mutation in exon 19 diagnosed in June of 2013   PRIOR THERAPY: palliative radiotherapy to the right lung under the care of Dr. Valere Dross expected to be completed on 10/29/2011.   CURRENT THERAPY:  1. [name] 150 mg by mouth daily started 10/11/2011. Status post 19 months of therapy 2. Xgeva 120 mg subcutaneously every 4 weeks.  CHEMOTHERAPY INTENT: Palliative  CURRENT # OF CHEMOTHERAPY CYCLES: 20  CURRENT ANTIEMETICS: Compazine  CURRENT SMOKING STATUS: Former smoker but quit.  ORAL CHEMOTHERAPY AND CONSENT: Oral [name]  CURRENT BISPHOSPHONATES USE: Xgeva  PAIN MANAGEMENT: well-controlled with oxycodone when necessary.  NARCOTICS INDUCED CONSTIPATION: None  LIVING WILL AND CODE STATUS: Full code.   INTERVAL HISTORY: Beth [name] 53 y.o. female returns to the clinic today for followup visit. The patient has no specific complaints. She continues to tolerate her [name] relatively well. She has occasional episodes of diarrhea but is well-controlled with Imodium. Currently she's not having issues with skin rash. She is due for her Xgeva injection today. She denied having any significant chest pain, shortness of breath, cough or hemoptysis. The patient denied having any significant weight loss or night sweats.   MEDICAL HISTORY: Past Medical History  Diagnosis Date  . Asthma   . GERD (gastroesophageal reflux disease)   . Hypertension   . Heart murmur   . Fibromyalgia   . Arthritis of knee, degenerative   . Endocarditis     as teenager  . Complication of anesthesia     difficulty waking up  . Shortness of breath   . Lung mass     R- ADENOCARCINOMA  . Sleep apnea     no longer using CPAP  . Pleural effusion  08/30/11  . 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 Anxiety   . Depression   . History of radiation therapy 09/29/11-11/04/2011    right lung 2700cGy 15 sessions    ALLERGIES:  is allergic to meloxicam.  MEDICATIONS:  Current Outpatient Prescriptions  Medication Sig Dispense Refill  . diphenhydrAMINE (BENADRYL) 25 mg capsule One to two tabs every 6 hours prn allergies  120 capsule  3  . fish oil-omega-3 fatty acids 1000 MG capsule Take 2 g by mouth daily.      . hydrochlorothiazide (HYDRODIURIL) 25 MG tablet TAKE 1 TABLET BY MOUTH DAILY  30 tablet  5  . loperamide (IMODIUM) 2 MG capsule Take 2 mg by mouth 4 (four) times daily as needed for diarrhea or loose stools.      . Multiple Vitamins-Minerals (MULTIVITAMIN WITH MINERALS) tablet Take 1 tablet by mouth daily.      Marland Kitchen oxycodone (OXY-IR) 5 MG capsule 1 tab every 6 hours as needed for pain  60 capsule  0  . prochlorperazine (COMPAZINE) 10 MG tablet Take 1 tablet (10 mg total) by mouth every 6 (six) hours as needed.  30 tablet  1  . [name] 150 MG tablet TAKE ONE TABLET DAILY  30 tablet  1  . venlafaxine XR (EFFEXOR-XR) 150 MG 24 hr capsule TAKE ONE CAPSULE BY MOUTH EVERY DAY  30 capsule  5  . cyclobenzaprine (FLEXERIL) 5 MG tablet TAKE 1 TABLET BY MOUTH ONCE DAILY AS NEEDED FOR MUSCLE SPASMS  30 tablet  2  . Fe Fum-FePoly-Vit C-Vit B3 (INTEGRA) 62.5-62.5-40-3 MG CAPS Take 1 tablet by mouth daily.  30 capsule  3   No current facility-administered medications for this visit.   Facility-Administered Medications Ordered in Other Visits  Medication Dose Route Frequency Provider Last Rate Last Dose  . denosumab (XGEVA) injection 120 mg  120 mg Subcutaneous Once Carlton Adam, PA-C        SURGICAL HISTORY:  Past Surgical History  Procedure Laterality Date  . Tubal ligation  2002  . Thoracentesis  09/02/11, 09/09/11    right-side pleural effusion    REVIEW OF SYSTEMS:  Constitutional: negative Eyes: negative Ears, nose, mouth,  throat, and face: negative Respiratory: positive for dyspnea on exertion Cardiovascular: negative Gastrointestinal: positive for diarrhea and nausea Genitourinary:negative Integument/breast: positive for rash Hematologic/lymphatic: negative Musculoskeletal:negative Neurological: negative Behavioral/Psych: negative Endocrine: negative Allergic/Immunologic: negative   PHYSICAL EXAMINATION: General appearance: alert, cooperative and no distress Head: Normocephalic, without obvious abnormality, atraumatic Neck: no adenopathy, no JVD, supple, symmetrical, trachea midline and thyroid not enlarged, symmetric, no tenderness/mass/nodules. There is fullness in the left supraclavicular area without discrete palpable lymphadenopathy, the right supraclavicular area is not similarly effected Lymph nodes: Cervical, supraclavicular, and axillary nodes normal. Resp: diminished breath sounds RLL and dullness to percussion RLL Back: symmetric, no curvature. ROM normal. No CVA tenderness. Cardio: regular rate and rhythm, S1, S2 normal, no murmur, click, rub or gallop GI: soft, non-tender; bowel sounds normal; no masses,  no organomegaly Extremities: extremities normal, atraumatic, no cyanosis or edema. Neurologic: Alert and oriented X 3, normal strength and tone. Normal symmetric reflexes. Normal coordination and gait  ECOG PERFORMANCE STATUS: 1 - Symptomatic but completely ambulatory  Blood pressure 148/93, pulse 92, temperature 97.7 F (36.5 C), temperature source Oral, resp. rate 18, height '5\' 7"'  (1.702 m), weight 261 lb (118.389 kg).  LABORATORY DATA: Lab Results  Component Value Date   WBC 6.7 08/08/2013   HGB 12.0 08/08/2013   HCT 36.7 08/08/2013   MCV 97.4 08/08/2013   PLT 236 08/08/2013      Chemistry      Component Value Date/Time   NA 143 08/08/2013 1435   NA 139 05/23/2012 1305   K 4.3 08/08/2013 1435   K 4.0 05/23/2012 1305   CL 104 08/18/2012 1154   CL 100 05/23/2012 1305   CO2 27 08/08/2013  1435   CO2 30 05/23/2012 1305   BUN 17.6 08/08/2013 1435   BUN 16 05/23/2012 1305   CREATININE 0.9 08/08/2013 1435   CREATININE 0.74 05/23/2012 1305      Component Value Date/Time   CALCIUM 10.0 08/08/2013 1435   CALCIUM 9.0 05/23/2012 1305   ALKPHOS 83 08/08/2013 1435   ALKPHOS 113 05/23/2012 1305   AST 21 08/08/2013 1435   AST 15 05/23/2012 1305   ALT 18 08/08/2013 1435   ALT 7 05/23/2012 1305   BILITOT 0.40 08/08/2013 1435   BILITOT 0.6 05/23/2012 1305       RADIOGRAPHIC STUDIES: CLINICAL DATA: Followup lung cancer.  EXAM:  CT CHEST, ABDOMEN, AND PELVIS WITH CONTRAST  TECHNIQUE:  Multidetector CT imaging of the chest, abdomen and pelvis was  performed following the standard protocol during bolus  administration of intravenous contrast.  CONTRAST: 167m OMNIPAQUE IOHEXOL 300 MG/ML SOLN  COMPARISON: 01/30/2013  FINDINGS:  CT CHEST FINDINGS  The heart size is normal. There is no pericardial effusion  identified. No enlarged mediastinal lymph nodes identified. There is  no axillary or supraclavicular adenopathy.  Postoperative  changes and volume loss throughout the right hemi  thorax is again noted. Anterior right lung base mass measures 3.3 x  1.9 cm, image 34/ series 2. Previously this measured 4.1 x 2.5 cm.  Previously referenced nodule in the right lung base measures 2.8 cm,  image 45/ series 2. Previously 2.9 cm. Pleural thickening and  minimally loculated fluid overlying the right lung appears similar  to previous exam. The left lung is clear.  Review of the visualized osseous structures shows diffuse sclerotic  bone metastasis. This appears similar to previous exam.  CT ABDOMEN AND PELVIS FINDINGS  The hepatomegaly appears similar to previous exam. There is mild  diffuse low attenuation within the liver parenchyma compatible with  hepatic steatosis. 7 mm low attenuation structure within the central  right hepatic lobe is unchanged. No new liver abnormality. The  gallbladder is  normal. No biliary dilatation. Normal appearance of  the pancreas. The spleen is unremarkable.  Normal appearance of the adrenal glands. The left kidney is normal.  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 This is  similar to previous exam. Urinary bladder appears normal. Small  uterine fibroids are identified as before.  There is mild calcified atherosclerotic disease involving the  abdominal aorta and its branches. There is no aneurysm. No pelvic or  inguinal adenopathy identified.  The stomach is normal. The small bowel loops have a normal course  and caliber without obstruction. The appendix appears normal. Normal  appearance of the proximal colon. Multiple distal colonic  diverticula are identified.  There is no ascites or focal fluid collections within the abdomen or  pelvis.  Multi focal sclerotic bone metastases are identified. This is  unchanged from the previous exam.  IMPRESSION:  CT chest:  1. Similar to decrease in size of right-sided pulmonary nodule in  mass. No evidence of new or progressive disease.  2. Re- demonstration of right-sided pleural thickening with  minimally loculated pleural fluid.  3. Similar osseous metastasis.  CT abdomen and pelvis:  1. No evidence of extra osseous metastasis within the abdomen or  pelvis.  2. Similar osseous metastasis.  3. Fibroid uterus.  Electronically Signed  By: Kerby Moors M.D.  On: 05/25/2013 15:31    ASSESSMENT AND PLAN: This is a very pleasant 53 years old [place] American female with metastatic non-small cell lung cancer, adenocarcinoma with positive EGFR mutation in exon 19 currently undergoing systemic treatment with oral [name] status post 19 months of treatment. The patient is tolerating her treatment fairly well with no significant adverse effect except for mild diarrhea. The patient was discussed with and also seen by Dr. Julien Nordmann. Her recent restaging CT scan from 05/25/2013 revealed no  evidence of new or progressive disease. She will continue on [name] at 150 mg by mouth daily. She will also continue with  Xgeva injections monthly for her bone metastasis. She is currently in the process of applying for Wyn[facility] Emery it would require her to switch her appointments from late afternoon to mornings. She will let us know when she has the details of that we can schedule her next appointment in approximately one month. The patient voices understanding of current disease status and treatment options and is in agreement with the current care plan.  All questions were answered. The patient knows to call the clinic with any problems, questions or concerns. We can certainly see the patient much sooner if necessary.  Carlton Adam, PA-C   She was advised to  call immediately if she has any concerning symptoms in the interval.  Carlton Adam, PA-C   Disclaimer: This note was dictated with voice recognition software. Similar sounding words can inadvertently be transcribed and may not be corrected upon review. Carlton Adam, PA-C 08/08/2013  ADDENDUM: Hematology/Oncology Attending:  I had a face to face encounter with the patient. I recommended her care plan. This is a very pleasant 53 years old African American female with metastatic non-small cell lung cancer, adenocarcinoma with positive EGFR mutation exon 19 diagnosed in June of 2013. She is currently on treatment with [name] 150 mg by mouth daily for the last 19 months and tolerating her treatment fairly well. Unfortunately the patient is fairly noncompliant with her follow up visit and missed several appointments recently. I have a lengthy discussion with the patient today about her condition and strongly recommended for her to be compliant with her follow up visits and blood work. I explained to the patient that she puts herself at risk for discharge from the practice if she continues to be noncompliant. She would continue her  current treatment with [name] as scheduled. She would come back for follow up visit in one month's for reevaluation after repeating CBC and comprehensive metabolic panel. She will call for an appointment in the next one-2 weeks after she look at her work schedule. She was advised to call immediately if she has any concerning symptoms in the interval.  Disclaimer: This note was dictated with voice recognition software. Similar sounding words can inadvertently be transcribed and may not be corrected upon review. Curt Bears, MD 08/12/2013

## 2013-08-11 NOTE — Patient Instructions (Signed)
Continue [name] at 150 mg mouth daily, and continue monthly Xgeva injections as scheduled Followup in approximately one month

## 2013-09-03 ENCOUNTER — Other Ambulatory Visit: Payer: Self-pay | Admitting: Internal Medicine

## 2013-09-03 ENCOUNTER — Telehealth: Payer: Self-pay | Admitting: Internal Medicine

## 2013-09-03 ENCOUNTER — Telehealth: Payer: Self-pay | Admitting: *Deleted

## 2013-09-03 DIAGNOSIS — C3490 Malignant neoplasm of unspecified part of unspecified bronchus or lung: Secondary | ICD-10-CM

## 2013-09-03 NOTE — Telephone Encounter (Signed)
returned pt call and lvm for pt to call back to sched appt...per Delos Haring get all pt info as to when she can come and then she will add pof

## 2013-09-03 NOTE — Telephone Encounter (Signed)
Pt called stating that she has a cough, congestion and some wheezing issues.  Per dr Vista Mink, pt needs to contact her PCP.  SLJ

## 2013-09-04 ENCOUNTER — Telehealth: Payer: Self-pay | Admitting: Internal Medicine

## 2013-09-04 ENCOUNTER — Telehealth: Payer: Self-pay | Admitting: Family Medicine

## 2013-09-04 MED ORDER — AZITHROMYCIN 250 MG PO TABS: ORAL_TABLET | ORAL | Status: DC

## 2013-09-04 NOTE — Telephone Encounter (Signed)
Pt called back today stating "My PCP is unavailable" and "I don't know why it is so hard to call in an antibiotic".  Per dr Vista Mink, pt needs to be assessed before giving an antibiotic.  Will give her a z-pak this time but in the future she needs to be evaluated first.     Pt also states that her puppy got into her tarceva rx bottle.  She thinks he ate 1 tablet, but "this was several days ago and the puppy is fine".  She discarded the bottle that had 5 tablets left and refilled a new one.  SLJ

## 2013-09-04 NOTE — Addendum Note (Signed)
Addended by: Britt Bottom on: 09/04/2013 02:16 PM   Modules accepted: Orders

## 2013-09-04 NOTE — Telephone Encounter (Signed)
Pt decline appt. Pt would like abx call into [facility] [address].  Pt is congestion

## 2013-09-04 NOTE — Telephone Encounter (Signed)
lvm for pt regarding to June appt.Marland KitchenMarland Kitchen

## 2013-09-04 NOTE — Telephone Encounter (Signed)
Called patient to see if she has any other symptoms

## 2013-09-05 ENCOUNTER — Other Ambulatory Visit: Payer: Self-pay | Admitting: Family Medicine

## 2013-09-05 NOTE — Telephone Encounter (Signed)
Needs to be seen

## 2013-09-05 NOTE — Telephone Encounter (Signed)
Left detailed message on VM that Pt needs to be seen.

## 2013-09-06 ENCOUNTER — Telehealth: Payer: Self-pay | Admitting: Family Medicine

## 2013-09-06 MED ORDER — VENLAFAXINE HCL ER 150 MG PO CP24: ORAL_CAPSULE | ORAL | Status: DC

## 2013-09-06 NOTE — Telephone Encounter (Signed)
Refill for 6 months. 

## 2013-09-06 NOTE — Telephone Encounter (Signed)
Per BB, please call the pt to make an appt to be seen in the next 2 months.

## 2013-09-06 NOTE — Telephone Encounter (Signed)
Pt req rx on venlafaxine XR (EFFEXOR-XR) 150 MG 24 hr capsule  Said she needs this asap she took the last one yesterday.. Her gyn rx her a zpack and if she continues to have problems she will contact Dr Elease Hashimoto

## 2013-09-07 NOTE — Telephone Encounter (Signed)
appt has been scheduled.

## 2013-09-10 ENCOUNTER — Telehealth: Payer: Self-pay | Admitting: Internal Medicine

## 2013-09-10 NOTE — Telephone Encounter (Signed)
returned pt call and advised pt on June appt again.Marland KitchenMarland KitchenMarland Kitchen

## 2013-09-11 ENCOUNTER — Other Ambulatory Visit: Payer: Self-pay | Admitting: Physician Assistant

## 2013-09-12 ENCOUNTER — Ambulatory Visit (HOSPITAL_BASED_OUTPATIENT_CLINIC_OR_DEPARTMENT_OTHER): Payer: BC Managed Care – PPO

## 2013-09-12 ENCOUNTER — Other Ambulatory Visit (HOSPITAL_BASED_OUTPATIENT_CLINIC_OR_DEPARTMENT_OTHER): Payer: BC Managed Care – PPO

## 2013-09-12 ENCOUNTER — Ambulatory Visit (HOSPITAL_BASED_OUTPATIENT_CLINIC_OR_DEPARTMENT_OTHER): Payer: BC Managed Care – PPO | Admitting: Nurse Practitioner

## 2013-09-12 VITALS — BP 128/93 | HR 111 | Temp 98.3°F | Resp 20 | Ht 67.0 in | Wt 258.5 lb

## 2013-09-12 DIAGNOSIS — C3410 Malignant neoplasm of upper lobe, unspecified bronchus or lung: Secondary | ICD-10-CM

## 2013-09-12 DIAGNOSIS — C7952 Secondary malignant neoplasm of bone marrow: Secondary | ICD-10-CM

## 2013-09-12 DIAGNOSIS — C3491 Malignant neoplasm of unspecified part of right bronchus or lung: Secondary | ICD-10-CM

## 2013-09-12 DIAGNOSIS — C3490 Malignant neoplasm of unspecified part of unspecified bronchus or lung: Secondary | ICD-10-CM

## 2013-09-12 DIAGNOSIS — C7951 Secondary malignant neoplasm of bone: Secondary | ICD-10-CM

## 2013-09-12 LAB — CBC WITH DIFFERENTIAL/PLATELET
BASO%: 0.3 % (ref 0.0–2.0)
Basophils Absolute: 0 10*3/uL (ref 0.0–0.1)
EOS%: 1.4 % (ref 0.0–7.0)
Eosinophils Absolute: 0.1 10*3/uL (ref 0.0–0.5)
HCT: 35.7 % (ref 34.8–46.6)
HGB: 11.6 g/dL (ref 11.6–15.9)
LYMPH#: 2.5 10*3/uL (ref 0.9–3.3)
LYMPH%: 35.7 % (ref 14.0–49.7)
MCH: 31.5 pg (ref 25.1–34.0)
MCHC: 32.5 g/dL (ref 31.5–36.0)
MCV: 97 fL (ref 79.5–101.0)
MONO#: 0.6 10*3/uL (ref 0.1–0.9)
MONO%: 7.9 % (ref 0.0–14.0)
NEUT#: 3.8 10*3/uL (ref 1.5–6.5)
NEUT%: 54.7 % (ref 38.4–76.8)
Platelets: 348 10*3/uL (ref 145–400)
RBC: 3.68 10*6/uL — AB (ref 3.70–5.45)
RDW: 14 % (ref 11.2–14.5)
WBC: 6.9 10*3/uL (ref 3.9–10.3)

## 2013-09-12 LAB — COMPREHENSIVE METABOLIC PANEL (CC13)
ALBUMIN: 3.6 g/dL (ref 3.5–5.0)
ALT: 15 U/L (ref 0–55)
AST: 22 U/L (ref 5–34)
Alkaline Phosphatase: 92 U/L (ref 40–150)
Anion Gap: 7 mEq/L (ref 3–11)
BUN: 15.4 mg/dL (ref 7.0–26.0)
CALCIUM: 9.5 mg/dL (ref 8.4–10.4)
CHLORIDE: 104 meq/L (ref 98–109)
CO2: 30 meq/L — AB (ref 22–29)
Creatinine: 0.8 mg/dL (ref 0.6–1.1)
GLUCOSE: 105 mg/dL (ref 70–140)
Potassium: 4.2 mEq/L (ref 3.5–5.1)
SODIUM: 141 meq/L (ref 136–145)
TOTAL PROTEIN: 7.9 g/dL (ref 6.4–8.3)
Total Bilirubin: 0.39 mg/dL (ref 0.20–1.20)

## 2013-09-12 MED ORDER — OXYCODONE HCL 5 MG PO CAPS
ORAL_CAPSULE | ORAL | Status: DC
Start: 2013-09-12 — End: 2013-10-17

## 2013-09-12 MED ORDER — DENOSUMAB 120 MG/1.7ML ~~LOC~~ SOLN
120.0000 mg | Freq: Once | SUBCUTANEOUS | Status: AC
  Administered 2013-09-12: 120 mg via SUBCUTANEOUS
  Filled 2013-09-12: qty 1.7

## 2013-09-12 MED ORDER — GUAIFENESIN-CODEINE 100-10 MG/5ML PO SYRP
5.0000 mL | ORAL_SOLUTION | Freq: Three times a day (TID) | ORAL | Status: DC | PRN
Start: 2013-09-12 — End: 2013-12-12

## 2013-09-12 NOTE — Progress Notes (Addendum)
Beth [name]   Diagnosis:  Metastatic non-small cell lung cancer, adenocarcinoma with positive EGFR mutation in exon 19 diagnosed June 2013.  INTERVAL HISTORY:   Beth [name] returns as scheduled. She continues [name]. She denies nausea/vomiting. No mouth sores. She has intermittent loose stools which at times seems to be diet related. She notes a dry skin rash on her face which she attributes to the [name]. She has a good appetite. No weight loss. She has recently had a "cough and cold". She was congested. She completed a Z-Pak with some improvement. No fever or shortness of breath. She is smoking 2 cigarettes a day. She has had chest discomfort with coughing. She takes oxycodone as needed.  Objective:  Vital signs in last 24 hours:  Blood pressure 128/93, pulse 111, temperature 98.3 F (36.8 C), temperature source Oral, resp. rate 20, height $RemoveBe'5\' 7"'hoHoYYHzz$  (1.702 m), weight 258 lb 8 oz (117.255 kg), SpO2 100.00%.    HEENT: No thrush or ulcerations. Mucous membranes are moist. Lymphatics: No palpable cervical or supraclavicular lymph nodes. Left supraclavicular region has a full appearance as compared to the right. Resp: Lungs clear. Breath sounds are diminished at the right lung base. Cardio: Regular cardiac rhythm. GI: Abdomen soft and nontender. No mass. No organomegaly. Vascular: No leg edema.  Skin: Patches of dry skin on the face.    Lab Results:  Lab Results  Component Value Date   WBC 6.9 09/12/2013   HGB 11.6 09/12/2013   HCT 35.7 09/12/2013   MCV 97.0 09/12/2013   PLT 348 09/12/2013   NEUTROABS 3.8 09/12/2013    Imaging:  No results found.  Medications: I have reviewed the patient's current medications.  Assessment/Plan: 1. Metastatic non-small cell lung cancer, adenocarcinoma with positive for EGFR mutation in exon 19 diagnosed June 2013.   [name] 150 mg daily initiated 10/11/2011.   Status post palliative radiation to the right lung  completed 10/29/2011.   Restaging CT evaluation 05/25/2013 with similar to decrease in size of right-sided pulmonary nodule and mass. No evidence of new or progressive disease. Similar osseous metastasis.   Disposition: Beth [name] appears stable. She will continue [name]. We are referring her for restaging CT scans in the next few weeks. Dr. Julien Nordmann like her to return for a followup visit in one month. She has just started a new job and does not know her work schedule. She will contact the office tomorrow to schedule the one month appointment.  She will continue monthly Denosumab.  A new oxycodone prescription was given at today's visit. She was also given a prescription for guaifenesin-codeine cough syrup.  Patient seen with Dr. Julien Nordmann.    Beth [name] ANP/GNP-BC   09/12/2013  2:53 PM  ADDENDUM: Hematology/Oncology Attending: I had a face to face encounter with the patient today. I recommended her care plan. This is a very pleasant 53 years old African American female was metastatic non-small cell lung cancer, adenocarcinoma diagnosed in June of 2013 with positive EGFR mutation in exon 46. She is currently undergoing treatment with oral [name] 150 mg by mouth daily and tolerating it fairly well. She continues to have mild skin rash. She was recently treated with Z-Pak for her chest congestion and an acute bronchitis. I recommended for the patient to continue her current treatment was [name]. She would come back for followup visit in one month with repeat CT scan of the chest, abdomen and pelvis for restaging of her disease. The patient will continue  on Xgeva for her metastatic bone disease. She was advised to call immediately if she has any concerning symptoms in the interval.  Disclaimer: This [name] was dictated with voice recognition software. Similar sounding words can inadvertently be transcribed and may not be corrected upon review. Eilleen Kempf.,  MD 09/12/2013

## 2013-09-14 ENCOUNTER — Telehealth: Payer: Self-pay | Admitting: Nurse Practitioner

## 2013-09-14 NOTE — Telephone Encounter (Signed)
, °

## 2013-09-24 ENCOUNTER — Ambulatory Visit (HOSPITAL_COMMUNITY): Payer: BC Managed Care – PPO

## 2013-09-26 ENCOUNTER — Telehealth: Payer: Self-pay | Admitting: Internal Medicine

## 2013-09-26 NOTE — Telephone Encounter (Signed)
returned pt call and sched return appt.Marland Kitchen.forwarded pt to radiology

## 2013-09-27 ENCOUNTER — Ambulatory Visit (HOSPITAL_COMMUNITY): Payer: BC Managed Care – PPO

## 2013-09-28 ENCOUNTER — Telehealth: Payer: Self-pay | Admitting: Internal Medicine

## 2013-09-28 NOTE — Telephone Encounter (Signed)
pt called to r/s appt to 7.29 she initially requested wrong mth...done.Marland KitchenMarland Kitchenforwarded pt to radiology

## 2013-10-08 ENCOUNTER — Other Ambulatory Visit: Payer: Self-pay | Admitting: Internal Medicine

## 2013-10-11 IMAGING — US US THORACENTESIS ASP PLEURAL SPACE W/IMG GUIDE
1 series · 3 of 3 positions shown · non-contrast
Comparison: CT imaging of the chest.

CLINICAL DATA: Symptomatic right pleural effusion of uncertain
etiology.  Request has been made for large right-sided
thoracentesis.

ULTRASOUND GUIDED right THORACENTESIS

[Series 1: us thoracentesis asp pleural space w/img guide · 0.35mm/px · 3 of 3 slices shown]
[im 1/3]
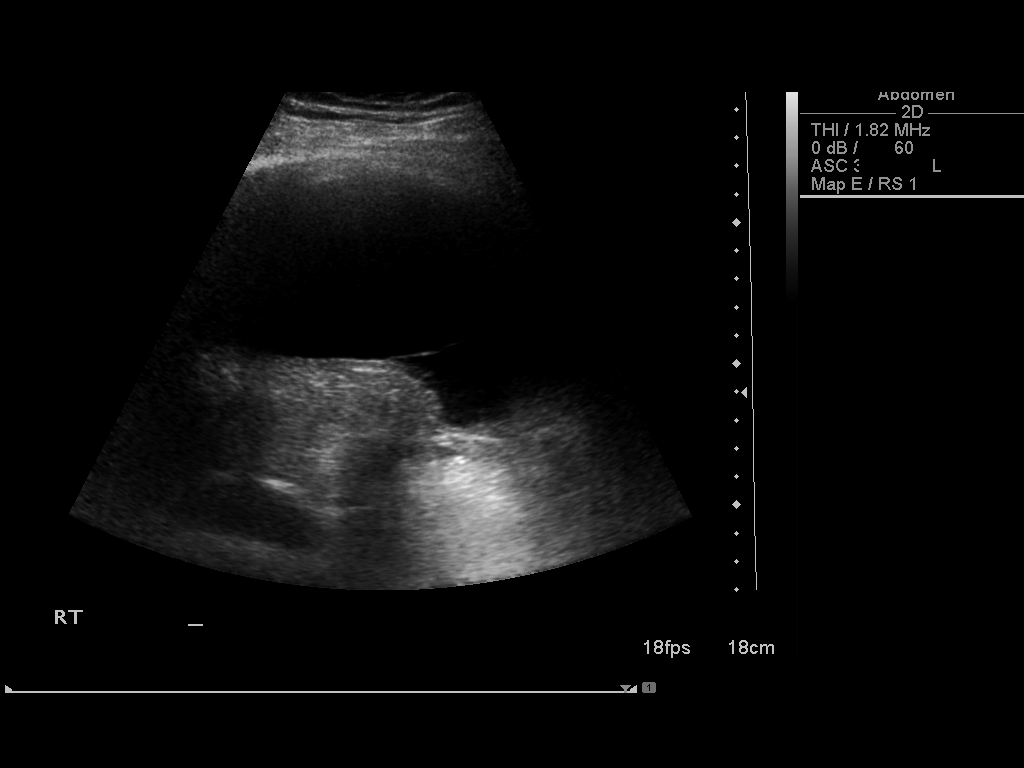
[im 2/3]
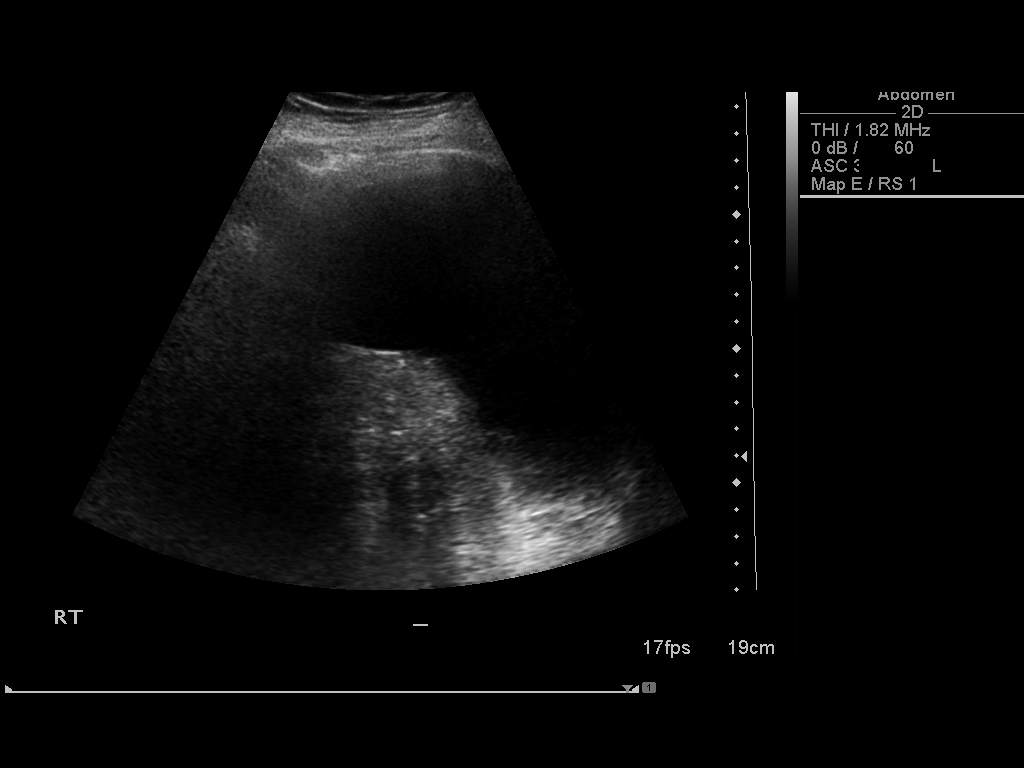
[im 3/3]
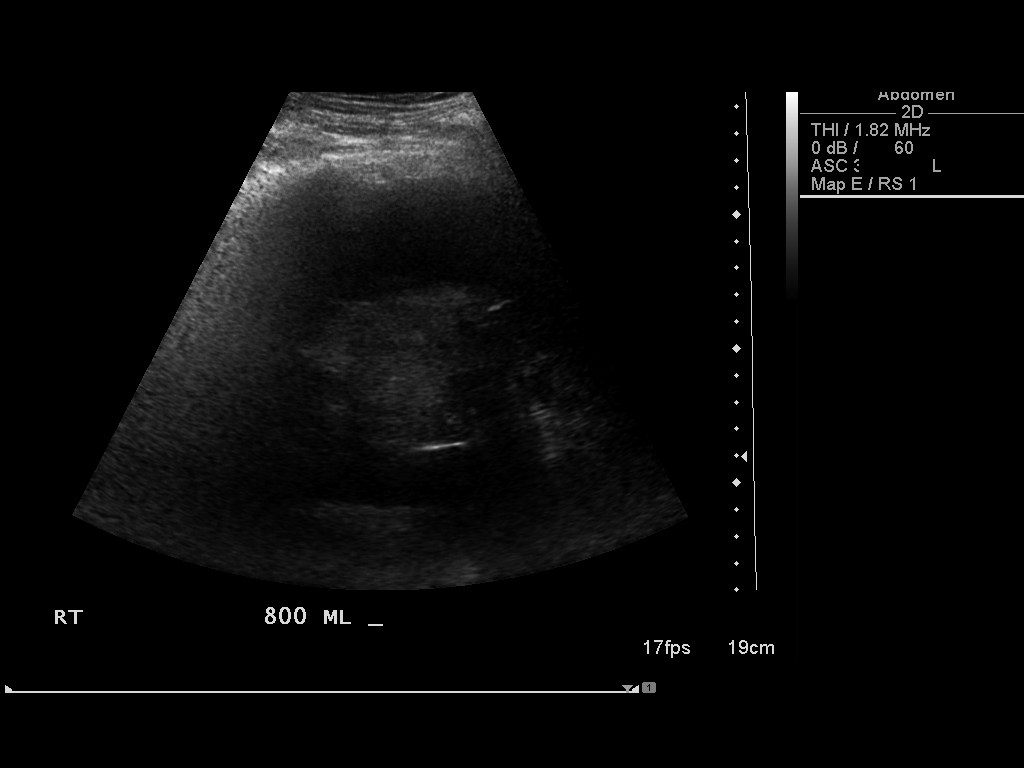

[3 of 3 positions shown; findings below may reference images not displayed]

An ultrasound guided thoracentesis was thoroughly discussed with
the patient and questions answered.  The benefits, risks,
alternatives and complications were also discussed.  The patient
understands and wishes to proceed with the procedure.  Written
consent was obtained.

Ultrasound was performed to localize and mark an adequate pocket of
fluid in the right chest.  The area was then prepped and draped in
the normal sterile fashion.  1% Lidocaine was used for local
anesthesia.  Under ultrasound guidance a 19 gauge Yueh catheter was
introduced.  Thoracentesis was performed. During aspiration the
patient moved pulling the Dxn out of position and the procedure
was aborted. A dressing was applied and patient went for post CXR
which was reviewed with radiologist, Dr. Inestroza.

Complications:  None immediate
FINDINGS: A total of approximately 800 ml of bloody serous fluid
was removed. A fluid sample was sent for laboratory analysis.
IMPRESSION: Successful ultrasound guided right thoracentesis yielding 800 ml of
pleural fluid.

Read by: Tiger, Alia.-ARRII

## 2013-10-16 ENCOUNTER — Other Ambulatory Visit: Payer: Self-pay | Admitting: Medical Oncology

## 2013-10-16 DIAGNOSIS — C3490 Malignant neoplasm of unspecified part of unspecified bronchus or lung: Secondary | ICD-10-CM

## 2013-10-17 ENCOUNTER — Encounter: Payer: Self-pay | Admitting: Physician Assistant

## 2013-10-17 ENCOUNTER — Other Ambulatory Visit: Payer: Self-pay

## 2013-10-17 ENCOUNTER — Other Ambulatory Visit (HOSPITAL_BASED_OUTPATIENT_CLINIC_OR_DEPARTMENT_OTHER): Payer: BC Managed Care – PPO

## 2013-10-17 ENCOUNTER — Ambulatory Visit (HOSPITAL_BASED_OUTPATIENT_CLINIC_OR_DEPARTMENT_OTHER): Payer: BC Managed Care – PPO

## 2013-10-17 ENCOUNTER — Encounter (HOSPITAL_COMMUNITY): Payer: Self-pay

## 2013-10-17 ENCOUNTER — Ambulatory Visit (HOSPITAL_BASED_OUTPATIENT_CLINIC_OR_DEPARTMENT_OTHER): Payer: BC Managed Care – PPO | Admitting: Physician Assistant

## 2013-10-17 ENCOUNTER — Ambulatory Visit (HOSPITAL_COMMUNITY)
Admission: RE | Admit: 2013-10-17 | Discharge: 2013-10-17 | Disposition: A | Discharge status: Home / Self Care | Payer: BC Managed Care – PPO | Source: Ambulatory Visit | Attending: Nurse Practitioner | Admitting: Nurse Practitioner

## 2013-10-17 VITALS — BP 149/105 | HR 55 | Temp 97.8°F

## 2013-10-17 VITALS — BP 152/115 | HR 89 | Temp 98.1°F | Resp 20 | Ht 67.0 in | Wt 256.5 lb

## 2013-10-17 DIAGNOSIS — R05 Cough: Secondary | ICD-10-CM | POA: Insufficient documentation

## 2013-10-17 DIAGNOSIS — R197 Diarrhea, unspecified: Secondary | ICD-10-CM

## 2013-10-17 DIAGNOSIS — C7952 Secondary malignant neoplasm of bone marrow: Secondary | ICD-10-CM

## 2013-10-17 DIAGNOSIS — C7951 Secondary malignant neoplasm of bone: Secondary | ICD-10-CM

## 2013-10-17 DIAGNOSIS — C3490 Malignant neoplasm of unspecified part of unspecified bronchus or lung: Secondary | ICD-10-CM | POA: Insufficient documentation

## 2013-10-17 DIAGNOSIS — J984 Other disorders of lung: Secondary | ICD-10-CM | POA: Insufficient documentation

## 2013-10-17 DIAGNOSIS — R0602 Shortness of breath: Secondary | ICD-10-CM | POA: Insufficient documentation

## 2013-10-17 DIAGNOSIS — C3410 Malignant neoplasm of upper lobe, unspecified bronchus or lung: Secondary | ICD-10-CM

## 2013-10-17 DIAGNOSIS — R110 Nausea: Secondary | ICD-10-CM | POA: Insufficient documentation

## 2013-10-17 DIAGNOSIS — R109 Unspecified abdominal pain: Secondary | ICD-10-CM | POA: Insufficient documentation

## 2013-10-17 DIAGNOSIS — R059 Cough, unspecified: Secondary | ICD-10-CM | POA: Insufficient documentation

## 2013-10-17 DIAGNOSIS — K769 Liver disease, unspecified: Secondary | ICD-10-CM | POA: Insufficient documentation

## 2013-10-17 DIAGNOSIS — C3491 Malignant neoplasm of unspecified part of right bronchus or lung: Secondary | ICD-10-CM

## 2013-10-17 DIAGNOSIS — N859 Noninflammatory disorder of uterus, unspecified: Secondary | ICD-10-CM | POA: Insufficient documentation

## 2013-10-17 DIAGNOSIS — J90 Pleural effusion, not elsewhere classified: Secondary | ICD-10-CM | POA: Insufficient documentation

## 2013-10-17 DIAGNOSIS — K449 Diaphragmatic hernia without obstruction or gangrene: Secondary | ICD-10-CM | POA: Insufficient documentation

## 2013-10-17 LAB — CBC WITH DIFFERENTIAL/PLATELET
BASO%: 0.2 % (ref 0.0–2.0)
Basophils Absolute: 0 10*3/uL (ref 0.0–0.1)
EOS%: 1.6 % (ref 0.0–7.0)
Eosinophils Absolute: 0.1 10*3/uL (ref 0.0–0.5)
HCT: 36.5 % (ref 34.8–46.6)
HGB: 11.8 g/dL (ref 11.6–15.9)
LYMPH#: 1.4 10*3/uL (ref 0.9–3.3)
LYMPH%: 30.8 % (ref 14.0–49.7)
MCH: 31.2 pg (ref 25.1–34.0)
MCHC: 32.3 g/dL (ref 31.5–36.0)
MCV: 96.6 fL (ref 79.5–101.0)
MONO#: 0.3 10*3/uL (ref 0.1–0.9)
MONO%: 7.5 % (ref 0.0–14.0)
NEUT#: 2.6 10*3/uL (ref 1.5–6.5)
NEUT%: 59.9 % (ref 38.4–76.8)
Platelets: 241 10*3/uL (ref 145–400)
RBC: 3.78 10*6/uL (ref 3.70–5.45)
RDW: 15.2 % — ABNORMAL HIGH (ref 11.2–14.5)
WBC: 4.4 10*3/uL (ref 3.9–10.3)

## 2013-10-17 LAB — COMPREHENSIVE METABOLIC PANEL (CC13)
ALT: 20 U/L (ref 0–55)
ANION GAP: 6 meq/L (ref 3–11)
AST: 36 U/L — ABNORMAL HIGH (ref 5–34)
Albumin: 3.4 g/dL — ABNORMAL LOW (ref 3.5–5.0)
Alkaline Phosphatase: 85 U/L (ref 40–150)
BILIRUBIN TOTAL: 0.61 mg/dL (ref 0.20–1.20)
BUN: 7.4 mg/dL (ref 7.0–26.0)
CALCIUM: 9.1 mg/dL (ref 8.4–10.4)
CHLORIDE: 107 meq/L (ref 98–109)
CO2: 28 meq/L (ref 22–29)
CREATININE: 0.7 mg/dL (ref 0.6–1.1)
Glucose: 100 mg/dl (ref 70–140)
Potassium: 4.1 mEq/L (ref 3.5–5.1)
SODIUM: 141 meq/L (ref 136–145)
TOTAL PROTEIN: 6.9 g/dL (ref 6.4–8.3)

## 2013-10-17 MED ORDER — DENOSUMAB 120 MG/1.7ML ~~LOC~~ SOLN
120.0000 mg | Freq: Once | SUBCUTANEOUS | Status: AC
  Administered 2013-10-17: 120 mg via SUBCUTANEOUS
  Filled 2013-10-17: qty 1.7

## 2013-10-17 MED ORDER — PROCHLORPERAZINE MALEATE 10 MG PO TABS: 10.0000 mg | ORAL_TABLET | Freq: Four times a day (QID) | ORAL | Status: DC | PRN

## 2013-10-17 MED ORDER — DIPHENOXYLATE-ATROPINE 2.5-0.025 MG PO TABS: 1.0000 | ORAL_TABLET | Freq: Four times a day (QID) | ORAL | Status: DC | PRN

## 2013-10-17 MED ORDER — IOHEXOL 300 MG/ML  SOLN
100.0000 mL | Freq: Once | INTRAMUSCULAR | Status: AC | PRN
  Administered 2013-10-17: 100 mL via INTRAVENOUS

## 2013-10-17 MED ORDER — OXYCODONE HCL 5 MG PO CAPS: ORAL_CAPSULE | ORAL | Status: DC

## 2013-10-17 NOTE — Progress Notes (Addendum)
[place] [phone]   [phone]  SHARED VISIT PROGRESS NOTE  [name], MD [place] [address]  DIAGNOSIS AND STAGE: Metastatic non-small cell lung cancer, adenocarcinoma with positive EGFR mutation in exon 19 diagnosed in June of 2013   PRIOR THERAPY: palliative radiotherapy to the right lung under the care of Dr. Valere Dross expected to be completed on 10/29/2011.   CURRENT THERAPY:  1. [name] 150 mg by mouth daily started 10/11/2011. Status post 24 months of therapy 2. Xgeva 120 mg subcutaneously every 4 weeks.  CHEMOTHERAPY INTENT: Palliative  CURRENT # OF CHEMOTHERAPY CYCLES: 25  CURRENT ANTIEMETICS: Compazine  CURRENT SMOKING STATUS: Former smoker but quit.  ORAL CHEMOTHERAPY AND CONSENT: Oral [name]  CURRENT BISPHOSPHONATES USE: Xgeva  PAIN MANAGEMENT: well-controlled with oxycodone when necessary.  NARCOTICS INDUCED CONSTIPATION: None  LIVING WILL AND CODE STATUS: Full code.   INTERVAL HISTORY: Beth [name] 53 y.o. female returns to the clinic today for followup visit. The patient has no specific complaints. She continues to tolerate her [name] relatively well. She complains of increased episodes of diarrhea. She feels that it may be related to the [name] as well as her recent increased stress level regarding her son. She states that the Imodium is not completely controlling her diarrhea She requests refills for her oxycodone and Compazine.  Currently she's not having issues with skin rash. She is due for her Xgeva injection today. She denied having any significant chest pain, shortness of breath, cough or hemoptysis. The patient denied having any significant weight loss or night sweats. She recently had a restaging CT scan of the chest, abdomen and pelvis and presents to discuss the results  MEDICAL HISTORY: Past Medical History  Diagnosis Date  . Asthma   . GERD (gastroesophageal reflux  disease)   . Hypertension   . Heart murmur   . Fibromyalgia   . Arthritis of knee, degenerative   . Endocarditis     as teenager  . Complication of anesthesia     difficulty waking up  . Shortness of breath   . Lung mass     R- ADENOCARCINOMA  . Sleep apnea     no longer using CPAP  . Pleural effusion 08/30/11  . Anxiety   . Depression   . History of radiation therapy 09/29/11-11/04/2011    right lung 2700cGy 15 sessions  . Osseous metastasis 09/20/11    per PET scan    ALLERGIES:  is allergic to meloxicam.  MEDICATIONS:  Current Outpatient Prescriptions  Medication Sig Dispense Refill  . diphenhydrAMINE (BENADRYL) 25 mg capsule One to two tabs every 6 hours prn allergies  120 capsule  3  . fish oil-omega-3 fatty acids 1000 MG capsule Take 2 g by mouth daily.      . hydrochlorothiazide (HYDRODIURIL) 25 MG tablet TAKE 1 TABLET BY MOUTH DAILY  30 tablet  5  . loperamide (IMODIUM) 2 MG capsule Take 2 mg by mouth 4 (four) times daily as needed for diarrhea or loose stools.      . Multiple Vitamins-Minerals (MULTIVITAMIN WITH MINERALS) tablet Take 1 tablet by mouth daily.      Marland Kitchen [name] 150 MG tablet TAKE ONE TABLET DAILY  30 tablet  0  . venlafaxine XR (EFFEXOR-XR) 150 MG 24 hr capsule TAKE ONE CAPSULE BY MOUTH EVERY DAY  30 capsule  5  . cyclobenzaprine (FLEXERIL) 5 MG tablet TAKE 1 TABLET BY MOUTH ONCE DAILY AS  NEEDED FOR MUSCLE SPASMS  30 tablet  2  . diphenoxylate-atropine (LOMOTIL) 2.5-0.025 MG per tablet Take 1 tablet by mouth 4 (four) times daily as needed for diarrhea or loose stools.  30 tablet  1  . Fe Fum-FePoly-Vit C-Vit B3 (INTEGRA) 62.5-62.5-40-3 MG CAPS Take 1 tablet by mouth daily.  30 capsule  3  . guaiFENesin-codeine (ROBITUSSIN AC) 100-10 MG/5ML syrup Take 5 mLs by mouth 3 (three) times daily as needed for cough.  120 mL  0  . oxycodone (OXY-IR) 5 MG capsule 1 tab every 6 hours as needed for pain  60 capsule  0  . prochlorperazine (COMPAZINE) 10 MG tablet Take 1  tablet (10 mg total) by mouth every 6 (six) hours as needed.  30 tablet  1   No current facility-administered medications for this visit.    SURGICAL HISTORY:  Past Surgical History  Procedure Laterality Date  . Tubal ligation  2002  . Thoracentesis  09/02/11, 09/09/11    right-side pleural effusion    REVIEW OF SYSTEMS:  Constitutional: negative Eyes: negative Ears, nose, mouth, throat, and face: negative Respiratory: positive for dyspnea on exertion Cardiovascular: negative Gastrointestinal: positive for diarrhea and nausea Genitourinary:negative Integument/breast: positive for rash Hematologic/lymphatic: negative Musculoskeletal:negative Neurological: negative Behavioral/Psych: negative Endocrine: negative Allergic/Immunologic: negative   PHYSICAL EXAMINATION: General appearance: alert, cooperative and no distress Head: Normocephalic, without obvious abnormality, atraumatic Neck: no adenopathy, no JVD, supple, symmetrical, trachea midline and thyroid not enlarged, symmetric, no tenderness/mass/nodules. There is fullness in the left supraclavicular area without discrete palpable lymphadenopathy, the right supraclavicular area is not similarly effected Lymph nodes: Cervical, supraclavicular, and axillary nodes normal. Resp: diminished breath sounds RLL and dullness to percussion RLL Back: symmetric, no curvature. ROM normal. No CVA tenderness. Cardio: regular rate and rhythm, S1, S2 normal, no murmur, click, rub or gallop GI: soft, non-tender; bowel sounds normal; no masses,  no organomegaly Extremities: extremities normal, atraumatic, no cyanosis or edema. Neurologic: Alert and oriented X 3, normal strength and tone. Normal symmetric reflexes. Normal coordination and gait  ECOG PERFORMANCE STATUS: 1 - Symptomatic but completely ambulatory  Blood pressure 152/115, pulse 89, temperature 98.1 F (36.7 C), temperature source Oral, resp. rate 20, height '5\' 7"'  (1.702 m), weight 256  lb 8 oz (116.348 kg).  LABORATORY DATA: Lab Results  Component Value Date   WBC 4.4 10/17/2013   HGB 11.8 10/17/2013   HCT 36.5 10/17/2013   MCV 96.6 10/17/2013   PLT 241 10/17/2013      Chemistry      Component Value Date/Time   NA 141 10/17/2013 1342   NA 139 05/23/2012 1305   K 4.1 10/17/2013 1342   K 4.0 05/23/2012 1305   CL 104 08/18/2012 1154   CL 100 05/23/2012 1305   CO2 28 10/17/2013 1342   CO2 30 05/23/2012 1305   BUN 7.4 10/17/2013 1342   BUN 16 05/23/2012 1305   CREATININE 0.7 10/17/2013 1342   CREATININE 0.74 05/23/2012 1305      Component Value Date/Time   CALCIUM 9.1 10/17/2013 1342   CALCIUM 9.0 05/23/2012 1305   ALKPHOS 85 10/17/2013 1342   ALKPHOS 113 05/23/2012 1305   AST 36* 10/17/2013 1342   AST 15 05/23/2012 1305   ALT 20 10/17/2013 1342   ALT 7 05/23/2012 1305   BILITOT 0.61 10/17/2013 1342   BILITOT 0.6 05/23/2012 1305       RADIOGRAPHIC STUDIES: Ct Chest W Contrast  10/17/2013   CLINICAL DATA:  History of  lung cancer diagnosed in June 2013 status post radiation therapy which is now complete. Chemotherapy in progress. Cough and shortness of breath. Abdominal pain, nausea and diarrhea.  EXAM: CT CHEST, ABDOMEN, AND PELVIS WITH CONTRAST  TECHNIQUE: Multidetector CT imaging of the chest, abdomen and pelvis was performed following the standard protocol during bolus administration of intravenous contrast.  CONTRAST:  192m OMNIPAQUE IOHEXOL 300 MG/ML  SOLN  COMPARISON:  Multiple priors, most recently 05/25/2013.  FINDINGS: CT CHEST FINDINGS  Mediastinum: Heart size is normal. There is no significant pericardial fluid, thickening or pericardial calcification. Severe calcification of the mitral aortic intervalvular fibrosa (unchanged). No pathologically enlarged mediastinal or hilar lymph nodes. Esophagus is unremarkable in appearance. Slight rightward shift of cardiomediastinal structures related to chronic right-sided volume loss (unchanged). 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  Lungs/Pleura: Again noted is an opacity in the anterior aspect of the right lower lobe (image 33 of series 2) measuring approximately 2.9 x 1.8 cm, likely to represent a scarred remnant of the treated tumor. No signs to suggest local recurrence of disease. There continues to be extensive septal thickening, architectural distortion and chronic volume loss in the right lung related to prior radiation therapy. Chronic thick-walled small right pleural effusion is unchanged, as is extensive chronic pleural enhancement.  Musculoskeletal: As with the prior examination there continue to be innumerable predominantly the sclerotic osseous metastases throughout the visualized axial and appendicular skeleton which appear very similar to the prior study.  CT ABDOMEN AND PELVIS FINDINGS  Abdomen/Pelvis: Small ill-defined area of low attenuation in segment 4B of the liver is most compatible with focal fatty infiltration. 6 mm low attenuation lesion in segment 8 of the liver is too small to characterize, but is unchanged compared to the prior study, favored to represent a small cyst. The appearance of the gallbladder, pancreas, spleen, bilateral adrenal glands and the left kidney is unremarkable. The right kidney is inferiorly displaced and demonstrates and altered renal axis, but is otherwise unremarkable in appearance (these findings are unchanged). A few colonic diverticulae are noted, particularly in the region of the sigmoid colon, without surrounding inflammatory changes to suggest an acute diverticulitis at this time. No significant volume of ascites. No pneumoperitoneum. No pathologic distention of small bowel. No lymphadenopathy identified in the abdomen or pelvis. Again noted is an exophytic 3.7 x 4.9 x 3.5 cm soft tissue lesion in the fundal portion of the uterus, presumably a fibroid (similar to the prior examination). Ovary are unremarkable in appearance. Urinary bladder is normal  in appearance.  Musculoskeletal: Diffuse sclerotic osseous metastases redemonstrated throughout all visualized portions of the axial and appendicular skeleton.  IMPRESSION: 1. Widespread metastatic disease to the bones redemonstrated. Posttreatment related changes in the right hemithorax from prior radiation therapy without evidence of significant residual or recurrent disease on today's examination. No new sites of metastatic disease are noted. 2. 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 3. Fundal uterine fibroid is unchanged. 4. Additional incidental findings, similar to the prior examination, as above.   Electronically Signed   By: DVinnie LangtonM.D.   On: 10/17/2013 16:34   Ct Abdomen Pelvis W Contrast  10/17/2013   CLINICAL DATA:  History of lung cancer diagnosed in June 2013 status post radiation therapy which is now complete. Chemotherapy in progress. Cough and shortness of breath. Abdominal pain, nausea and diarrhea.  EXAM: CT CHEST, ABDOMEN, AND PELVIS WITH CONTRAST  TECHNIQUE: Multidetector  CT imaging of the chest, abdomen and pelvis was performed following the standard protocol during bolus administration of intravenous contrast.  CONTRAST:  178m OMNIPAQUE IOHEXOL 300 MG/ML  SOLN  COMPARISON:  Multiple priors, most recently 05/25/2013.  FINDINGS: CT CHEST FINDINGS  Mediastinum: Heart size is normal. There is no significant pericardial fluid, thickening or pericardial calcification. Severe calcification of the mitral aortic intervalvular fibrosa (unchanged). No pathologically enlarged mediastinal or hilar lymph nodes. Esophagus is unremarkable in appearance. Slight rightward shift of cardiomediastinal structures related to chronic right-sided volume loss (unchanged). 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  Lungs/Pleura: Again noted is an opacity in the anterior aspect of the right lower lobe (image 33 of  series 2) measuring approximately 2.9 x 1.8 cm, likely to represent a scarred remnant of the treated tumor. No signs to suggest local recurrence of disease. There continues to be extensive septal thickening, architectural distortion and chronic volume loss in the right lung related to prior radiation therapy. Chronic thick-walled small right pleural effusion is unchanged, as is extensive chronic pleural enhancement.  Musculoskeletal: As with the prior examination there continue to be innumerable predominantly the sclerotic osseous metastases throughout the visualized axial and appendicular skeleton which appear very similar to the prior study.  CT ABDOMEN AND PELVIS FINDINGS  Abdomen/Pelvis: Small ill-defined area of low attenuation in segment 4B of the liver is most compatible with focal fatty infiltration. 6 mm low attenuation lesion in segment 8 of the liver is too small to characterize, but is unchanged compared to the prior study, favored to represent a small cyst. The appearance of the gallbladder, pancreas, spleen, bilateral adrenal glands and the left kidney is unremarkable. The right kidney is inferiorly displaced and demonstrates and altered renal axis, but is otherwise unremarkable in appearance (these findings are unchanged). A few colonic diverticulae are noted, particularly in the region of the sigmoid colon, without surrounding inflammatory changes to suggest an acute diverticulitis at this time. No significant volume of ascites. No pneumoperitoneum. No pathologic distention of small bowel. No lymphadenopathy identified in the abdomen or pelvis. Again noted is an exophytic 3.7 x 4.9 x 3.5 cm soft tissue lesion in the fundal portion of the uterus, presumably a fibroid (similar to the prior examination). Ovary are unremarkable in appearance. Urinary bladder is normal in appearance.  Musculoskeletal: Diffuse sclerotic osseous metastases redemonstrated throughout all visualized portions of the axial and  appendicular skeleton.  IMPRESSION: 1. Widespread metastatic disease to the bones redemonstrated. Posttreatment related changes in the right hemithorax from prior radiation therapy without evidence of significant residual or recurrent disease on today's examination. No new sites of metastatic disease are noted. 2. 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 3. Fundal uterine fibroid is unchanged. 4. Additional incidental findings, similar to the prior examination, as above.   Electronically Signed   By: DVinnie LangtonM.D.   On: 10/17/2013 16:34    ASSESSMENT AND PLAN: This is a very pleasant 53years old [place] female with metastatic non-small cell lung cancer, adenocarcinoma with positive EGFR mutation in exon 19 currently undergoing systemic treatment with oral [name] status post 20 months of treatment. The patient is tolerating her treatment fairly well with no significant adverse effect except for increased diarrhea. The patient was discussed with and also seen by Dr. MJulien Nordmann Her recent restaging CT scan is pending from today. She will continue on [name] at 150 mg by mouth daily. She will  also continue with  Xgeva injections monthly for her bone metastasis. Should her CT scan reveal evidence for disease progression, we will have her return to discuss treatment options. She will follow up in 1 month for another symptom management. The patient voices understanding of current disease status and treatment options and is in agreement with the current care plan.  All questions were answered. The patient knows to call the clinic with any problems, questions or concerns. We can certainly see the patient much sooner if necessary.   She was advised to call immediately if she has any concerning symptoms in the interval.  Carlton Adam, PA-C   Disclaimer: This note was dictated with voice recognition software. Similar sounding words can  inadvertently be transcribed and may not be corrected upon review. Carlton Adam, PA-C 10/17/2013  ADDENDUM: Hematology/Oncology Attending: I had a face to face encounter with the patient. I recommended her care plan. This is a very pleasant 53 years old African American female with history of metastatic non-small cell lung cancer, adenocarcinoma diagnosed in June of 2013 was positive EGFR mutation and currently on treatment with [name] 150 mg by mouth daily. The patient is rating her treatment fairly well with no significant adverse effects except for a few episodes of diarrhea. Her recent CT scan of the chest, abdomen and pelvis showed no evidence for disease progression. I personally reviewed the images and discuss the results with the patient. I recommended for her to continue her current treatment was [name] at the same dose. For the bone disease she would continue her treatment with Xgeva. She would come back for followup visit in one month's for reevaluation. She was advised to call immediately if she has any concerning symptoms in the interval.  Disclaimer: This note was dictated with voice recognition software. Similar sounding words can inadvertently be transcribed and may not be corrected upon review. Eilleen Kempf., MD 10/22/2013

## 2013-10-19 ENCOUNTER — Telehealth: Payer: Self-pay | Admitting: Internal Medicine

## 2013-10-19 NOTE — Patient Instructions (Signed)
Continue [name] 150 mg by mouth daily Follow up in 1 month for another symptom management visit and Xgeva injection.

## 2013-10-19 NOTE — Telephone Encounter (Signed)
s.w. pt and advised on Aug appt....pt ok and aware °

## 2013-10-22 NOTE — Addendum Note (Signed)
Addended by: Curt Bears on: 10/22/2013 02:52 PM   Modules accepted: Level of Service

## 2013-10-31 ENCOUNTER — Other Ambulatory Visit: Payer: Self-pay | Admitting: Medical Oncology

## 2013-10-31 DIAGNOSIS — C3490 Malignant neoplasm of unspecified part of unspecified bronchus or lung: Secondary | ICD-10-CM

## 2013-10-31 MED ORDER — ERLOTINIB HCL 150 MG PO TABS: 150.0000 mg | ORAL_TABLET | Freq: Every day | ORAL | Status: DC

## 2013-10-31 NOTE — Telephone Encounter (Signed)
Refill sent to [facility] for auth .

## 2013-11-08 ENCOUNTER — Ambulatory Visit: Payer: Self-pay | Admitting: Family Medicine

## 2013-11-13 ENCOUNTER — Ambulatory Visit (HOSPITAL_COMMUNITY): Payer: BC Managed Care – PPO

## 2013-11-13 ENCOUNTER — Ambulatory Visit: Payer: BC Managed Care – PPO | Admitting: Internal Medicine

## 2013-11-13 ENCOUNTER — Other Ambulatory Visit: Payer: BC Managed Care – PPO

## 2013-11-13 ENCOUNTER — Ambulatory Visit: Payer: BC Managed Care – PPO

## 2013-11-14 ENCOUNTER — Other Ambulatory Visit: Payer: Self-pay

## 2013-11-14 ENCOUNTER — Encounter: Payer: Self-pay | Admitting: Physician Assistant

## 2013-11-14 ENCOUNTER — Ambulatory Visit (HOSPITAL_BASED_OUTPATIENT_CLINIC_OR_DEPARTMENT_OTHER): Payer: BC Managed Care – PPO

## 2013-11-14 ENCOUNTER — Ambulatory Visit (HOSPITAL_BASED_OUTPATIENT_CLINIC_OR_DEPARTMENT_OTHER): Payer: BC Managed Care – PPO | Admitting: Physician Assistant

## 2013-11-14 ENCOUNTER — Other Ambulatory Visit (HOSPITAL_BASED_OUTPATIENT_CLINIC_OR_DEPARTMENT_OTHER): Payer: BC Managed Care – PPO

## 2013-11-14 VITALS — BP 136/95 | HR 124 | Temp 98.6°F | Resp 18 | Ht 67.0 in | Wt 251.8 lb

## 2013-11-14 DIAGNOSIS — C7951 Secondary malignant neoplasm of bone: Secondary | ICD-10-CM

## 2013-11-14 DIAGNOSIS — C3490 Malignant neoplasm of unspecified part of unspecified bronchus or lung: Secondary | ICD-10-CM

## 2013-11-14 DIAGNOSIS — R21 Rash and other nonspecific skin eruption: Secondary | ICD-10-CM

## 2013-11-14 DIAGNOSIS — C7952 Secondary malignant neoplasm of bone marrow: Secondary | ICD-10-CM

## 2013-11-14 DIAGNOSIS — C3400 Malignant neoplasm of unspecified main bronchus: Secondary | ICD-10-CM

## 2013-11-14 DIAGNOSIS — R0609 Other forms of dyspnea: Secondary | ICD-10-CM

## 2013-11-14 DIAGNOSIS — C3410 Malignant neoplasm of upper lobe, unspecified bronchus or lung: Secondary | ICD-10-CM

## 2013-11-14 DIAGNOSIS — R112 Nausea with vomiting, unspecified: Secondary | ICD-10-CM

## 2013-11-14 DIAGNOSIS — C3491 Malignant neoplasm of unspecified part of right bronchus or lung: Secondary | ICD-10-CM

## 2013-11-14 DIAGNOSIS — R0989 Other specified symptoms and signs involving the circulatory and respiratory systems: Secondary | ICD-10-CM

## 2013-11-14 DIAGNOSIS — R197 Diarrhea, unspecified: Secondary | ICD-10-CM

## 2013-11-14 LAB — COMPREHENSIVE METABOLIC PANEL (CC13)
ALT: 18 U/L (ref 0–55)
AST: 23 U/L (ref 5–34)
Albumin: 3.5 g/dL (ref 3.5–5.0)
Alkaline Phosphatase: 81 U/L (ref 40–150)
Anion Gap: 9 mEq/L (ref 3–11)
BILIRUBIN TOTAL: 0.39 mg/dL (ref 0.20–1.20)
BUN: 14.8 mg/dL (ref 7.0–26.0)
CO2: 27 mEq/L (ref 22–29)
CREATININE: 0.8 mg/dL (ref 0.6–1.1)
Calcium: 9.2 mg/dL (ref 8.4–10.4)
Chloride: 107 mEq/L (ref 98–109)
Glucose: 96 mg/dl (ref 70–140)
Potassium: 3.8 mEq/L (ref 3.5–5.1)
Sodium: 143 mEq/L (ref 136–145)
Total Protein: 7.4 g/dL (ref 6.4–8.3)

## 2013-11-14 LAB — CBC WITH DIFFERENTIAL/PLATELET
BASO%: 1.1 % (ref 0.0–2.0)
BASOS ABS: 0.1 10*3/uL (ref 0.0–0.1)
EOS%: 1.1 % (ref 0.0–7.0)
Eosinophils Absolute: 0.1 10*3/uL (ref 0.0–0.5)
HCT: 38.7 % (ref 34.8–46.6)
HEMOGLOBIN: 12.4 g/dL (ref 11.6–15.9)
LYMPH%: 27.3 % (ref 14.0–49.7)
MCH: 31.1 pg (ref 25.1–34.0)
MCHC: 32 g/dL (ref 31.5–36.0)
MCV: 97.2 fL (ref 79.5–101.0)
MONO#: 0.4 10*3/uL (ref 0.1–0.9)
MONO%: 7.2 % (ref 0.0–14.0)
NEUT#: 3.8 10*3/uL (ref 1.5–6.5)
NEUT%: 63.3 % (ref 38.4–76.8)
Platelets: 215 10*3/uL (ref 145–400)
RBC: 3.98 10*6/uL (ref 3.70–5.45)
RDW: 15 % — ABNORMAL HIGH (ref 11.2–14.5)
WBC: 6 10*3/uL (ref 3.9–10.3)
lymph#: 1.6 10*3/uL (ref 0.9–3.3)

## 2013-11-14 MED ORDER — TEMAZEPAM 15 MG PO CAPS: 15.0000 mg | ORAL_CAPSULE | Freq: Every evening | ORAL | Status: DC | PRN

## 2013-11-14 MED ORDER — OXYCODONE HCL 5 MG PO CAPS: 10.0000 mg | ORAL_CAPSULE | Freq: Four times a day (QID) | ORAL | Status: DC | PRN

## 2013-11-14 MED ORDER — OXYCODONE HCL 5 MG PO CAPS: ORAL_CAPSULE | ORAL | Status: DC

## 2013-11-14 MED ORDER — OXYCODONE HCL 10 MG PO TABS: 10.0000 mg | ORAL_TABLET | Freq: Four times a day (QID) | ORAL | Status: DC | PRN

## 2013-11-14 MED ORDER — DENOSUMAB 120 MG/1.7ML ~~LOC~~ SOLN
120.0000 mg | Freq: Once | SUBCUTANEOUS | Status: AC
  Administered 2013-11-14: 120 mg via SUBCUTANEOUS
  Filled 2013-11-14: qty 1.7

## 2013-11-14 MED ORDER — INTEGRA 62.5-62.5-40-3 MG PO CAPS: 1.0000 | ORAL_CAPSULE | Freq: Every day | ORAL | Status: DC

## 2013-11-14 NOTE — Progress Notes (Signed)
[facility] [phone]   [phone]  OFFICE PROGRESS NOTE  [name], MD [place] [address]  DIAGNOSIS AND STAGE: Metastatic non-small cell lung cancer, adenocarcinoma with positive EGFR mutation in exon 19 diagnosed in June of 2013   PRIOR THERAPY: palliative radiotherapy to the right lung under the care of Dr. Valere Dross expected to be completed on 10/29/2011.   CURRENT THERAPY:  1. [name] 150 mg by mouth daily started 10/11/2011. Status post 25 months of therapy 2. Xgeva 120 mg subcutaneously every 4 weeks.  CHEMOTHERAPY INTENT: Palliative  CURRENT # OF CHEMOTHERAPY CYCLES: 26  CURRENT ANTIEMETICS: Compazine  CURRENT SMOKING STATUS: Former smoker but quit.  ORAL CHEMOTHERAPY AND CONSENT: Oral [name]  CURRENT BISPHOSPHONATES USE: Xgeva  PAIN MANAGEMENT: well-controlled with oxycodone when necessary.  NARCOTICS INDUCED CONSTIPATION: None  LIVING WILL AND CODE STATUS: Full code.   INTERVAL HISTORY: Beth [name] 53 y.o. female returns to the clinic today for followup visit. She continues to tolerate her [name] relatively well. Her diarrhea is back to baseline related to her [name] therapy and is manageable with Imodium or Lomotil. She does complain of difficulty sleeping and requests a prescription sleep aid. She also notes increased own pain particularly at night and requests an increase in the strength of her pain medication. She requests a refill for her oxycodone. Currently she's not having issues with skin rash. She is due for her Xgeva injection today. She denied having any significant chest pain, shortness of breath, cough or hemoptysis. The patient denied having any significant weight loss or night sweats.   MEDICAL HISTORY: Past Medical History  Diagnosis Date  . Asthma   . GERD (gastroesophageal reflux disease)   . Hypertension   . Heart murmur   . Fibromyalgia   . Arthritis of knee,  degenerative   . Endocarditis     as teenager  . Complication of anesthesia     difficulty waking up  . Shortness of breath   . Lung mass     R- ADENOCARCINOMA  . Sleep apnea     no longer using CPAP  . Pleural effusion 08/30/11  . Anxiety   . Depression   . History of radiation therapy 09/29/11-11/04/2011    right lung 2700cGy 15 sessions  . Osseous metastasis 09/20/11    per PET scan    ALLERGIES:  is allergic to meloxicam.  MEDICATIONS:  Current Outpatient Prescriptions  Medication Sig Dispense Refill  . diphenhydrAMINE (BENADRYL) 25 mg capsule One to two tabs every 6 hours prn allergies  120 capsule  3  . diphenoxylate-atropine (LOMOTIL) 2.5-0.025 MG per tablet Take 1 tablet by mouth 4 (four) times daily as needed for diarrhea or loose stools.  30 tablet  1  . erlotinib ([name]) 150 MG tablet Take 1 tablet (150 mg total) by mouth daily. Take on an empty stomach 1 hour before meals or 2 hours after.  30 tablet  0  . fish oil-omega-3 fatty acids 1000 MG capsule Take 2 g by mouth daily.      Marland Kitchen guaiFENesin-codeine (ROBITUSSIN AC) 100-10 MG/5ML syrup Take 5 mLs by mouth 3 (three) times daily as needed for cough.  120 mL  0  . hydrochlorothiazide (HYDRODIURIL) 25 MG tablet TAKE 1 TABLET BY MOUTH DAILY  30 tablet  5  . loperamide (IMODIUM) 2 MG capsule Take 2 mg by mouth 4 (four) times daily as needed for diarrhea or loose stools.      Marland Kitchen  Multiple Vitamins-Minerals (MULTIVITAMIN WITH MINERALS) tablet Take 1 tablet by mouth daily.      . prochlorperazine (COMPAZINE) 10 MG tablet Take 1 tablet (10 mg total) by mouth every 6 (six) hours as needed.  30 tablet  1  . venlafaxine XR (EFFEXOR-XR) 150 MG 24 hr capsule TAKE ONE CAPSULE BY MOUTH EVERY DAY  30 capsule  5  . Fe Fum-FePoly-Vit C-Vit B3 (INTEGRA) 62.5-62.5-40-3 MG CAPS Take 1 tablet by mouth daily.  30 capsule  3  . Oxycodone HCl 10 MG TABS Take 1 tablet (10 mg total) by mouth every 6 (six) hours as needed.  60 tablet  0  . temazepam  (RESTORIL) 15 MG capsule Take 1 capsule (15 mg total) by mouth at bedtime as needed for sleep.  30 capsule  0   No current facility-administered medications for this visit.    SURGICAL HISTORY:  Past Surgical History  Procedure Laterality Date  . Tubal ligation  2002  . Thoracentesis  09/02/11, 09/09/11    right-side pleural effusion    REVIEW OF SYSTEMS:  Constitutional: negative Eyes: negative Ears, nose, mouth, throat, and face: negative Respiratory: positive for dyspnea on exertion Cardiovascular: negative Gastrointestinal: positive for diarrhea and nausea Genitourinary:negative Integument/breast: positive for rash Hematologic/lymphatic: negative Musculoskeletal:negative Neurological: negative Behavioral/Psych: negative Endocrine: negative Allergic/Immunologic: negative   PHYSICAL EXAMINATION: General appearance: alert, cooperative and no distress Head: Normocephalic, without obvious abnormality, atraumatic Neck: no adenopathy, no JVD, supple, symmetrical, trachea midline and thyroid not enlarged, symmetric, no tenderness/mass/nodules. There is fullness in the left supraclavicular area without discrete palpable lymphadenopathy, the right supraclavicular area is not similarly effected Lymph nodes: Cervical, supraclavicular, and axillary nodes normal. Resp: diminished breath sounds RLL and dullness to percussion RLL Back: symmetric, no curvature. ROM normal. No CVA tenderness. Cardio: regular rate and rhythm, S1, S2 normal, no murmur, click, rub or gallop GI: soft, non-tender; bowel sounds normal; no masses,  no organomegaly Extremities: extremities normal, atraumatic, no cyanosis or edema. Neurologic: Alert and oriented X 3, normal strength and tone. Normal symmetric reflexes. Normal coordination and gait  ECOG PERFORMANCE STATUS: 1 - Symptomatic but completely ambulatory  Blood pressure 136/95, pulse 124, temperature 98.6 F (37 C), temperature source Oral, resp. rate 18,  height '5\' 7"'  (1.702 m), weight 251 lb 12.8 oz (114.216 kg).  LABORATORY DATA: Lab Results  Component Value Date   WBC 6.0 11/14/2013   HGB 12.4 11/14/2013   HCT 38.7 11/14/2013   MCV 97.2 11/14/2013   PLT 215 11/14/2013      Chemistry      Component Value Date/Time   NA 143 11/14/2013 1456   NA 139 05/23/2012 1305   K 3.8 11/14/2013 1456   K 4.0 05/23/2012 1305   CL 104 08/18/2012 1154   CL 100 05/23/2012 1305   CO2 27 11/14/2013 1456   CO2 30 05/23/2012 1305   BUN 14.8 11/14/2013 1456   BUN 16 05/23/2012 1305   CREATININE 0.8 11/14/2013 1456   CREATININE 0.74 05/23/2012 1305      Component Value Date/Time   CALCIUM 9.2 11/14/2013 1456   CALCIUM 9.0 05/23/2012 1305   ALKPHOS 81 11/14/2013 1456   ALKPHOS 113 05/23/2012 1305   AST 23 11/14/2013 1456   AST 15 05/23/2012 1305   ALT 18 11/14/2013 1456   ALT 7 05/23/2012 1305   BILITOT 0.39 11/14/2013 1456   BILITOT 0.6 05/23/2012 1305       RADIOGRAPHIC STUDIES: Ct Chest W Contrast  10/17/2013  CLINICAL DATA:  History of lung cancer diagnosed in June 2013 status post radiation therapy which is now complete. Chemotherapy in progress. Cough and shortness of breath. Abdominal pain, nausea and diarrhea.  EXAM: CT CHEST, ABDOMEN, AND PELVIS WITH CONTRAST  TECHNIQUE: Multidetector CT imaging of the chest, abdomen and pelvis was performed following the standard protocol during bolus administration of intravenous contrast.  CONTRAST:  143m OMNIPAQUE IOHEXOL 300 MG/ML  SOLN  COMPARISON:  Multiple priors, most recently 05/25/2013.  FINDINGS: CT CHEST FINDINGS  Mediastinum: Heart size is normal. There is no significant pericardial fluid, thickening or pericardial calcification. Severe calcification of the mitral aortic intervalvular fibrosa (unchanged). No pathologically enlarged mediastinal or hilar lymph nodes. Esophagus is unremarkable in appearance. Slight rightward shift of cardiomediastinal structures related to chronic right-sided volume loss (unchanged). 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  Lungs/Pleura: Again noted is an opacity in the anterior aspect of the right lower lobe (image 33 of series 2) measuring approximately 2.9 x 1.8 cm, likely to represent a scarred remnant of the treated tumor. No signs to suggest local recurrence of disease. There continues to be extensive septal thickening, architectural distortion and chronic volume loss in the right lung related to prior radiation therapy. Chronic thick-walled small right pleural effusion is unchanged, as is extensive chronic pleural enhancement.  Musculoskeletal: As with the prior examination there continue to be innumerable predominantly the sclerotic osseous metastases throughout the visualized axial and appendicular skeleton which appear very similar to the prior study.  CT ABDOMEN AND PELVIS FINDINGS  Abdomen/Pelvis: Small ill-defined area of low attenuation in segment 4B of the liver is most compatible with focal fatty infiltration. 6 mm low attenuation lesion in segment 8 of the liver is too small to characterize, but is unchanged compared to the prior study, favored to represent a small cyst. The appearance of the gallbladder, pancreas, spleen, bilateral adrenal glands and the left kidney is unremarkable. The right kidney is inferiorly displaced and demonstrates and altered renal axis, but is otherwise unremarkable in appearance (these findings are unchanged). A few colonic diverticulae are noted, particularly in the region of the sigmoid colon, without surrounding inflammatory changes to suggest an acute diverticulitis at this time. No significant volume of ascites. No pneumoperitoneum. No pathologic distention of small bowel. No lymphadenopathy identified in the abdomen or pelvis. Again noted is an exophytic 3.7 x 4.9 x 3.5 cm soft tissue lesion in the fundal portion of the uterus, presumably a fibroid (similar to the prior examination). Ovary are unremarkable  in appearance. Urinary bladder is normal in appearance.  Musculoskeletal: Diffuse sclerotic osseous metastases redemonstrated throughout all visualized portions of the axial and appendicular skeleton.  IMPRESSION: 1. Widespread metastatic disease to the bones redemonstrated. Posttreatment related changes in the right hemithorax from prior radiation therapy without evidence of significant residual or recurrent disease on today's examination. No new sites of metastatic disease are noted. 2. 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 3. Fundal uterine fibroid is unchanged. 4. Additional incidental findings, similar to the prior examination, as above.   Electronically Signed   By: DVinnie LangtonM.D.   On: 10/17/2013 16:34   Ct Abdomen Pelvis W Contrast  10/17/2013   CLINICAL DATA:  History of lung cancer diagnosed in June 2013 status post radiation therapy which is now complete. Chemotherapy in progress. Cough and shortness of breath. Abdominal pain, nausea and diarrhea.  EXAM: CT CHEST, ABDOMEN, AND PELVIS  WITH CONTRAST  TECHNIQUE: Multidetector CT imaging of the chest, abdomen and pelvis was performed following the standard protocol during bolus administration of intravenous contrast.  CONTRAST:  12m OMNIPAQUE IOHEXOL 300 MG/ML  SOLN  COMPARISON:  Multiple priors, most recently 05/25/2013.  FINDINGS: CT CHEST FINDINGS  Mediastinum: Heart size is normal. There is no significant pericardial fluid, thickening or pericardial calcification. Severe calcification of the mitral aortic intervalvular fibrosa (unchanged). No pathologically enlarged mediastinal or hilar lymph nodes. Esophagus is unremarkable in appearance. Slight rightward shift of cardiomediastinal structures related to chronic right-sided volume loss (unchanged). 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  Lungs/Pleura: Again noted is an opacity in the anterior aspect  of the right lower lobe (image 33 of series 2) measuring approximately 2.9 x 1.8 cm, likely to represent a scarred remnant of the treated tumor. No signs to suggest local recurrence of disease. There continues to be extensive septal thickening, architectural distortion and chronic volume loss in the right lung related to prior radiation therapy. Chronic thick-walled small right pleural effusion is unchanged, as is extensive chronic pleural enhancement.  Musculoskeletal: As with the prior examination there continue to be innumerable predominantly the sclerotic osseous metastases throughout the visualized axial and appendicular skeleton which appear very similar to the prior study.  CT ABDOMEN AND PELVIS FINDINGS  Abdomen/Pelvis: Small ill-defined area of low attenuation in segment 4B of the liver is most compatible with focal fatty infiltration. 6 mm low attenuation lesion in segment 8 of the liver is too small to characterize, but is unchanged compared to the prior study, favored to represent a small cyst. The appearance of the gallbladder, pancreas, spleen, bilateral adrenal glands and the left kidney is unremarkable. The right kidney is inferiorly displaced and demonstrates and altered renal axis, but is otherwise unremarkable in appearance (these findings are unchanged). A few colonic diverticulae are noted, particularly in the region of the sigmoid colon, without surrounding inflammatory changes to suggest an acute diverticulitis at this time. No significant volume of ascites. No pneumoperitoneum. No pathologic distention of small bowel. No lymphadenopathy identified in the abdomen or pelvis. Again noted is an exophytic 3.7 x 4.9 x 3.5 cm soft tissue lesion in the fundal portion of the uterus, presumably a fibroid (similar to the prior examination). Ovary are unremarkable in appearance. Urinary bladder is normal in appearance.  Musculoskeletal: Diffuse sclerotic osseous metastases redemonstrated throughout all  visualized portions of the axial and appendicular skeleton.  IMPRESSION: 1. Widespread metastatic disease to the bones redemonstrated. Posttreatment related changes in the right hemithorax from prior radiation therapy without evidence of significant residual or recurrent disease on today's examination. No new sites of metastatic disease are noted. 2. 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 3. Fundal uterine fibroid is unchanged. 4. Additional incidental findings, similar to the prior examination, as above.   Electronically Signed   By: DVinnie LangtonM.D.   On: 10/17/2013 16:34    ASSESSMENT AND PLAN: This is a very pleasant 53years old [place] female with metastatic non-small cell lung cancer, adenocarcinoma with positive EGFR mutation in exon 19 currently undergoing systemic treatment with oral [name] status post 25 months of treatment. The patient is tolerating her treatment fairly well with no significant adverse effect except for  diarrhea. The patient was reviewed with Dr. MJulien Nordmann Her recent restaging CT scan revealed stable disease with no change to her wide spread metastatic bone disease. These results were  reviewed with [name]. She was given a copy of her CT scan results. For her difficulty sleeping have given her prescription for Restoril 15 mg by mouth at bedtime a total of 30 tablets with no refill. Her pain management of increased her oxycodone tablet to a 10 mg tablet, one tablet by mouth every 6 hours as needed for pain a total of 60 tablets with no refill. She will continue on [name] at 150 mg by mouth daily.  She will also continue with  Xgeva injections monthly for her bone metastasis.  She will follow up in 1 month for another symptom management. The patient voices understanding of current disease status and treatment options and is in agreement with the current care plan.  All questions were answered. The patient knows to call  the clinic with any problems, questions or concerns. We can certainly see the patient much sooner if necessary.   She was advised to call immediately if she has any concerning symptoms in the interval.  Disclaimer: This note was dictated with voice recognition software. Similar sounding words can inadvertently be transcribed and may not be corrected upon review. Carlton Adam, PA-C 11/14/2013

## 2013-11-14 NOTE — Patient Instructions (Signed)
Denosumab injection What is this medicine? DENOSUMAB (den oh sue mab) slows bone breakdown. Prolia is used to treat osteoporosis in women after menopause and in men. Xgeva is used to prevent bone fractures and other bone problems caused by cancer bone metastases. Xgeva is also used to treat giant cell tumor of the bone. This medicine may be used for other purposes; ask your health care provider or pharmacist if you have questions. COMMON BRAND NAME(S): Prolia, XGEVA What should I tell my health care provider before I take this medicine? They need to know if you have any of these conditions: -dental disease -eczema -infection or history of infections -kidney disease or on dialysis -low blood calcium or vitamin D -malabsorption syndrome -scheduled to have surgery or tooth extraction -taking medicine that contains denosumab -thyroid or parathyroid disease -an unusual reaction to denosumab, other medicines, foods, dyes, or preservatives -pregnant or trying to get pregnant -breast-feeding How should I use this medicine? This medicine is for injection under the skin. It is given by a health care professional in a hospital or clinic setting. If you are getting Prolia, a special MedGuide will be given to you by the pharmacist with each prescription and refill. Be sure to read this information carefully each time. For Prolia, talk to your pediatrician regarding the use of this medicine in children. Special care may be needed. For Xgeva, talk to your pediatrician regarding the use of this medicine in children. While this drug may be prescribed for children as young as 13 years for selected conditions, precautions do apply. Overdosage: If you think you've taken too much of this medicine contact a poison control center or emergency room at once. Overdosage: If you think you have taken too much of this medicine contact a poison control center or emergency room at once. NOTE: This medicine is only for  you. Do not share this medicine with others. What if I miss a dose? It is important not to miss your dose. Call your doctor or health care professional if you are unable to keep an appointment. What may interact with this medicine? Do not take this medicine with any of the following medications: -other medicines containing denosumab This medicine may also interact with the following medications: -medicines that suppress the immune system -medicines that treat cancer -steroid medicines like prednisone or cortisone This list may not describe all possible interactions. Give your health care provider a list of all the medicines, herbs, non-prescription drugs, or dietary supplements you use. Also tell them if you smoke, drink alcohol, or use illegal drugs. Some items may interact with your medicine. What should I watch for while using this medicine? Visit your doctor or health care professional for regular checks on your progress. Your doctor or health care professional may order blood tests and other tests to see how you are doing. Call your doctor or health care professional if you get a cold or other infection while receiving this medicine. Do not treat yourself. This medicine may decrease your body's ability to fight infection. You should make sure you get enough calcium and vitamin D while you are taking this medicine, unless your doctor tells you not to. Discuss the foods you eat and the vitamins you take with your health care professional. See your dentist regularly. Brush and floss your teeth as directed. Before you have any dental work done, tell your dentist you are receiving this medicine. Do not become pregnant while taking this medicine or for 5 months after stopping   it. Women should inform their doctor if they wish to become pregnant or think they might be pregnant. There is a potential for serious side effects to an unborn child. Talk to your health care professional or pharmacist for more  information. What side effects may I notice from receiving this medicine? Side effects that you should report to your doctor or health care professional as soon as possible: -allergic reactions like skin rash, itching or hives, swelling of the face, lips, or tongue -breathing problems -chest pain -fast, irregular heartbeat -feeling faint or lightheaded, falls -fever, chills, or any other sign of infection -muscle spasms, tightening, or twitches -numbness or tingling -skin blisters or bumps, or is dry, peels, or red -slow healing or unexplained pain in the mouth or jaw -unusual bleeding or bruising Side effects that usually do not require medical attention (Report these to your doctor or health care professional if they continue or are bothersome.): -muscle pain -stomach upset, gas This list may not describe all possible side effects. Call your doctor for medical advice about side effects. You may report side effects to FDA at 1-800-FDA-1088. Where should I keep my medicine? This medicine is only given in a clinic, doctor's office, or other health care setting and will not be stored at home. NOTE: This sheet is a summary. It may not cover all possible information. If you have questions about this medicine, talk to your doctor, pharmacist, or health care provider.  2015, Elsevier/Gold Standard. (2011-09-06 12:37:47)  

## 2013-11-15 ENCOUNTER — Telehealth: Payer: Self-pay | Admitting: Internal Medicine

## 2013-11-15 NOTE — Patient Instructions (Signed)
Continue taking [name] at 150 mg by mouth daily Your pain medication is been increased to 10 mg tablet, you may take one tablet by mouth every 6 hours as needed for pain To help you sleep you may take 1 Restoril 15 mg tablet at bedtime as needed Your recent CT scan revealed stable disease with no evidence for disease progression. Continue your monthly Xgeva injections Followup in one month

## 2013-11-15 NOTE — Telephone Encounter (Signed)
, °

## 2013-11-20 ENCOUNTER — Telehealth: Payer: Self-pay | Admitting: Medical Oncology

## 2013-11-20 NOTE — Telephone Encounter (Signed)
Asking for antibiotic. Coughing a lot, just feels like she has a fever.

## 2013-11-20 NOTE — Telephone Encounter (Addendum)
Per Dr Julien Nordmann I told pt she needs to see  PCP or NP. She said " I think its my allergies , but I don;'t want it to turn into anything else" . I called pt back and told her to see PCP and offered appt. "I think i will wait and see how I feel tomorrow"

## 2013-11-21 ENCOUNTER — Telehealth: Payer: Self-pay | Admitting: Internal Medicine

## 2013-11-21 NOTE — Telephone Encounter (Signed)
Pt called back, stating "I have lung cancer and I do not need to see a doctor.  I want cough medicine".  Informed her that per Dr Vista Mink, it may not be r/t her lung cancer and she needs to be evaluated by per PCP.  Pt hung up the phone.  SLJ

## 2013-11-21 NOTE — Telephone Encounter (Signed)
pt called and advised that she is having shots at pcp...will call back to r/s

## 2013-11-21 NOTE — Telephone Encounter (Signed)
pt called again to r/s lab and inj without MD visit....per MM pt needs to have visit. pt is aware.Marland KitchenMarland KitchenStephanie s.w pt and advised that medication and inj will not be refilled without MD visit.

## 2013-11-21 NOTE — Telephone Encounter (Signed)
Beth [name] called back, left a message stating: "Please cancel all my appointments.  I will call back and r/s my injection appt.  I will have my PCP draw my lab work since that is all Dr Vista Mink does and I will schedule an appointment when it is time for my CT scan".  Dr Vista Mink informed.  SLJ

## 2013-11-22 ENCOUNTER — Telehealth: Payer: Self-pay | Admitting: Internal Medicine

## 2013-11-22 ENCOUNTER — Telehealth: Payer: Self-pay | Admitting: Medical Oncology

## 2013-11-22 NOTE — Telephone Encounter (Signed)
Pt called to r/s all her appts with the cancer center. Onc tx request sent per dr Julien Nordmann.

## 2013-11-22 NOTE — Telephone Encounter (Signed)
returned pt call and r/s cx appt....advised on d.t

## 2013-11-26 ENCOUNTER — Other Ambulatory Visit: Payer: Self-pay | Admitting: Internal Medicine

## 2013-11-26 DIAGNOSIS — C3490 Malignant neoplasm of unspecified part of unspecified bronchus or lung: Secondary | ICD-10-CM

## 2013-11-28 ENCOUNTER — Ambulatory Visit: Payer: BC Managed Care – PPO | Admitting: Family Medicine

## 2013-12-05 ENCOUNTER — Ambulatory Visit: Payer: BC Managed Care – PPO | Admitting: Family Medicine

## 2013-12-12 ENCOUNTER — Ambulatory Visit: Payer: BC Managed Care – PPO | Admitting: Internal Medicine

## 2013-12-12 ENCOUNTER — Encounter: Payer: Self-pay | Admitting: Internal Medicine

## 2013-12-12 ENCOUNTER — Ambulatory Visit: Payer: BC Managed Care – PPO

## 2013-12-12 ENCOUNTER — Ambulatory Visit (HOSPITAL_BASED_OUTPATIENT_CLINIC_OR_DEPARTMENT_OTHER): Payer: BC Managed Care – PPO

## 2013-12-12 ENCOUNTER — Ambulatory Visit (HOSPITAL_BASED_OUTPATIENT_CLINIC_OR_DEPARTMENT_OTHER): Payer: BC Managed Care – PPO | Admitting: Internal Medicine

## 2013-12-12 ENCOUNTER — Other Ambulatory Visit (HOSPITAL_BASED_OUTPATIENT_CLINIC_OR_DEPARTMENT_OTHER): Payer: BC Managed Care – PPO

## 2013-12-12 ENCOUNTER — Other Ambulatory Visit: Payer: BC Managed Care – PPO

## 2013-12-12 VITALS — BP 129/84 | HR 101 | Temp 98.4°F | Resp 20 | Ht 67.0 in | Wt 261.2 lb

## 2013-12-12 DIAGNOSIS — C7952 Secondary malignant neoplasm of bone marrow: Secondary | ICD-10-CM

## 2013-12-12 DIAGNOSIS — J209 Acute bronchitis, unspecified: Secondary | ICD-10-CM

## 2013-12-12 DIAGNOSIS — C7951 Secondary malignant neoplasm of bone: Secondary | ICD-10-CM

## 2013-12-12 DIAGNOSIS — C3490 Malignant neoplasm of unspecified part of unspecified bronchus or lung: Secondary | ICD-10-CM

## 2013-12-12 DIAGNOSIS — C3410 Malignant neoplasm of upper lobe, unspecified bronchus or lung: Secondary | ICD-10-CM

## 2013-12-12 DIAGNOSIS — C3491 Malignant neoplasm of unspecified part of right bronchus or lung: Secondary | ICD-10-CM

## 2013-12-12 LAB — CBC WITH DIFFERENTIAL/PLATELET
BASO%: 0 % (ref 0.0–2.0)
Basophils Absolute: 0 10*3/uL (ref 0.0–0.1)
EOS%: 0 % (ref 0.0–7.0)
Eosinophils Absolute: 0 10*3/uL (ref 0.0–0.5)
HCT: 38.7 % (ref 34.8–46.6)
HGB: 12.6 g/dL (ref 11.6–15.9)
LYMPH%: 12.6 % — ABNORMAL LOW (ref 14.0–49.7)
MCH: 31.4 pg (ref 25.1–34.0)
MCHC: 32.6 g/dL (ref 31.5–36.0)
MCV: 96.5 fL (ref 79.5–101.0)
MONO#: 0.5 10*3/uL (ref 0.1–0.9)
MONO%: 4.3 % (ref 0.0–14.0)
NEUT#: 8.8 10*3/uL — ABNORMAL HIGH (ref 1.5–6.5)
NEUT%: 83.1 % — ABNORMAL HIGH (ref 38.4–76.8)
Platelets: 246 10*3/uL (ref 145–400)
RBC: 4.01 10*6/uL (ref 3.70–5.45)
RDW: 15.1 % — AB (ref 11.2–14.5)
WBC: 10.6 10*3/uL — AB (ref 3.9–10.3)
lymph#: 1.3 10*3/uL (ref 0.9–3.3)

## 2013-12-12 LAB — COMPREHENSIVE METABOLIC PANEL (CC13)
ALBUMIN: 3.5 g/dL (ref 3.5–5.0)
ALK PHOS: 80 U/L (ref 40–150)
ALT: 20 U/L (ref 0–55)
AST: 19 U/L (ref 5–34)
Anion Gap: 6 mEq/L (ref 3–11)
BUN: 20.9 mg/dL (ref 7.0–26.0)
CO2: 33 meq/L — AB (ref 22–29)
Calcium: 9.6 mg/dL (ref 8.4–10.4)
Chloride: 104 mEq/L (ref 98–109)
Creatinine: 1.1 mg/dL (ref 0.6–1.1)
GLUCOSE: 142 mg/dL — AB (ref 70–140)
POTASSIUM: 4.5 meq/L (ref 3.5–5.1)
SODIUM: 143 meq/L (ref 136–145)
TOTAL PROTEIN: 7.2 g/dL (ref 6.4–8.3)
Total Bilirubin: 0.53 mg/dL (ref 0.20–1.20)

## 2013-12-12 MED ORDER — DENOSUMAB 120 MG/1.7ML ~~LOC~~ SOLN
120.0000 mg | Freq: Once | SUBCUTANEOUS | Status: AC
  Administered 2013-12-12: 120 mg via SUBCUTANEOUS
  Filled 2013-12-12: qty 1.7

## 2013-12-12 MED ORDER — GUAIFENESIN-CODEINE 100-10 MG/5ML PO SYRP: 5.0000 mL | ORAL_SOLUTION | Freq: Three times a day (TID) | ORAL | Status: DC | PRN

## 2013-12-12 MED ORDER — AZITHROMYCIN 250 MG PO TABS: ORAL_TABLET | ORAL | Status: DC

## 2013-12-12 MED ORDER — OXYCODONE HCL 10 MG PO TABS: 10.0000 mg | ORAL_TABLET | Freq: Four times a day (QID) | ORAL | Status: DC | PRN

## 2013-12-12 NOTE — Progress Notes (Signed)
[facility] [phone]   [phone]  OFFICE PROGRESS NOTE  [name], MD [place] [address]  DIAGNOSIS AND STAGE: Metastatic non-small cell lung cancer, adenocarcinoma with positive EGFR mutation in exon 19 diagnosed in June of 2013   PRIOR THERAPY: palliative radiotherapy to the right lung under the care of Dr. Valere Dross expected to be completed on 10/29/2011.   CURRENT THERAPY:  1. [name] 150 mg by mouth daily started 10/11/2011. Status post 26 months of therapy 2. Xgeva 120 mg subcutaneously every 4 weeks.  CHEMOTHERAPY INTENT: Palliative  CURRENT # OF CHEMOTHERAPY CYCLES: 27  CURRENT ANTIEMETICS: Compazine  CURRENT SMOKING STATUS: Former smoker but quit.  ORAL CHEMOTHERAPY AND CONSENT: Oral [name]  CURRENT BISPHOSPHONATES USE: Xgeva  PAIN MANAGEMENT: well-controlled with oxycodone when necessary.  NARCOTICS INDUCED CONSTIPATION: None  LIVING WILL AND CODE STATUS: Full code.   INTERVAL HISTORY: Beth [name] 53 y.o. female returns to the clinic today for followup visit. She has been complaining of cough and chest congestion for the last 2 weeks. She did not take any medication for her condition. She denied having any significant fever or chills. She has cough productive of yellowish sputum. She declined to be seen by her primary care physician or the physician assistant at the [facility] at that time for evaluation of her persistent cough and chest congestion. She denied having any significant skin rash and only has few episodes of diarrhea every now and then. The patient denied having any significant weight loss or night sweats. She is tolerating her treatment with [name] fairly well. She is seen today to receive her Xgeva injection and have repeat lab work for evaluation of her treatment.  MEDICAL HISTORY: Past Medical History  Diagnosis Date  . Asthma   . GERD (gastroesophageal reflux disease)     . Hypertension   . Heart murmur   . Fibromyalgia   . Arthritis of knee, degenerative   . Endocarditis     as teenager  . Complication of anesthesia     difficulty waking up  . Shortness of breath   . Lung mass     R- ADENOCARCINOMA  . Sleep apnea     no longer using CPAP  . Pleural effusion 08/30/11  . Anxiety   . Depression   . History of radiation therapy 09/29/11-11/04/2011    right lung 2700cGy 15 sessions  . Osseous metastasis 09/20/11    per PET scan    ALLERGIES:  is allergic to meloxicam.  MEDICATIONS:  Current Outpatient Prescriptions  Medication Sig Dispense Refill  . diphenhydrAMINE (BENADRYL) 25 mg capsule One to two tabs every 6 hours prn allergies  120 capsule  3  . diphenoxylate-atropine (LOMOTIL) 2.5-0.025 MG per tablet Take 1 tablet by mouth 4 (four) times daily as needed for diarrhea or loose stools.  30 tablet  1  . Fe Fum-FePoly-Vit C-Vit B3 (INTEGRA) 62.5-62.5-40-3 MG CAPS Take 1 tablet by mouth daily.  30 capsule  3  . fish oil-omega-3 fatty acids 1000 MG capsule Take 2 g by mouth daily.      Marland Kitchen guaiFENesin-codeine (ROBITUSSIN AC) 100-10 MG/5ML syrup Take 5 mLs by mouth 3 (three) times daily as needed for cough.  120 mL  0  . hydrochlorothiazide (HYDRODIURIL) 25 MG tablet TAKE 1 TABLET BY MOUTH DAILY  30 tablet  5  . loperamide (IMODIUM) 2 MG capsule Take 2 mg by mouth 4 (four) times daily as  needed for diarrhea or loose stools.      . Multiple Vitamins-Minerals (MULTIVITAMIN WITH MINERALS) tablet Take 1 tablet by mouth daily.      . Oxycodone HCl 10 MG TABS Take 1 tablet (10 mg total) by mouth every 6 (six) hours as needed.  60 tablet  0  . prochlorperazine (COMPAZINE) 10 MG tablet Take 1 tablet (10 mg total) by mouth every 6 (six) hours as needed.  30 tablet  1  . [name] 150 MG tablet TAKE ONE TABLET (150MG TOTAL) DAILY. TAKE ON AN EMPTY STOMACH 1 HOUR BEFORE OR 2 HOURS AFTER MEALS  30 tablet  0  . temazepam (RESTORIL) 15 MG capsule Take 1 capsule (15 mg  total) by mouth at bedtime as needed for sleep.  30 capsule  0  . venlafaxine XR (EFFEXOR-XR) 150 MG 24 hr capsule TAKE ONE CAPSULE BY MOUTH EVERY DAY  30 capsule  5   No current facility-administered medications for this visit.    SURGICAL HISTORY:  Past Surgical History  Procedure Laterality Date  . Tubal ligation  2002  . Thoracentesis  09/02/11, 09/09/11    right-side pleural effusion    REVIEW OF SYSTEMS:  Constitutional: negative Eyes: negative Ears, nose, mouth, throat, and face: negative Respiratory: positive for dyspnea on exertion Cardiovascular: negative Gastrointestinal: negative Genitourinary:negative Integument/breast: negative Hematologic/lymphatic: negative Musculoskeletal:negative Neurological: negative Behavioral/Psych: negative Endocrine: negative Allergic/Immunologic: negative   PHYSICAL EXAMINATION: General appearance: alert, cooperative and no distress Head: Normocephalic, without obvious abnormality, atraumatic Neck: no adenopathy, no JVD, supple, symmetrical, trachea midline and thyroid not enlarged, symmetric, no tenderness/mass/nodules Lymph nodes: Cervical, supraclavicular, and axillary nodes normal. Resp: diminished breath sounds RLL and dullness to percussion RLL Back: symmetric, no curvature. ROM normal. No CVA tenderness. Cardio: regular rate and rhythm, S1, S2 normal, no murmur, click, rub or gallop GI: soft, non-tender; bowel sounds normal; no masses,  no organomegaly Extremities: extremities normal, atraumatic, no cyanosis or edema. There is darkening of the skin and swelling of the right fourth toe. Neurologic: Alert and oriented X 3, normal strength and tone. Normal symmetric reflexes. Normal coordination and gait  ECOG PERFORMANCE STATUS: 1 - Symptomatic but completely ambulatory  Blood pressure 129/84, pulse 101, temperature 98.4 F (36.9 C), temperature source Oral, resp. rate 20, height '5\' 7"'  (1.702 m), weight 261 lb 3.2 oz (118.48  kg).  LABORATORY DATA: Lab Results  Component Value Date   WBC 10.6* 12/12/2013   HGB 12.6 12/12/2013   HCT 38.7 12/12/2013   MCV 96.5 12/12/2013   PLT 246 12/12/2013      Chemistry      Component Value Date/Time   NA 143 11/14/2013 1456   NA 139 05/23/2012 1305   K 3.8 11/14/2013 1456   K 4.0 05/23/2012 1305   CL 104 08/18/2012 1154   CL 100 05/23/2012 1305   CO2 27 11/14/2013 1456   CO2 30 05/23/2012 1305   BUN 14.8 11/14/2013 1456   BUN 16 05/23/2012 1305   CREATININE 0.8 11/14/2013 1456   CREATININE 0.74 05/23/2012 1305      Component Value Date/Time   CALCIUM 9.2 11/14/2013 1456   CALCIUM 9.0 05/23/2012 1305   ALKPHOS 81 11/14/2013 1456   ALKPHOS 113 05/23/2012 1305   AST 23 11/14/2013 1456   AST 15 05/23/2012 1305   ALT 18 11/14/2013 1456   ALT 7 05/23/2012 1305   BILITOT 0.39 11/14/2013 1456   BILITOT 0.6 05/23/2012 1305       RADIOGRAPHIC STUDIES:  ASSESSMENT AND PLAN: This is a very pleasant 53 years old African American female with:  1)  metastatic non-small cell lung cancer, adenocarcinoma with positive EGFR mutation in exon 19 currently undergoing systemic treatment with oral [name] status post 26 months of treatment. The patient is tolerating her treatment fairly well with no significant adverse effect except for mild diarrhea.  I recommended for her to continue on [name] 150 mg by mouth daily. She would come back for followup visit in one month with repeat CT scan of the chest, abdomen and pelvis for restaging of her disease.  2) acute bronchitis: I will start the patient on Z-Pak and I also did have refill for the cough medicine, Robitussin-AC.  3) metastatic bone disease:  the patient will continue on Xgeva 120 mg subcutaneously every 4 weeks. The patient voices understanding of current disease status and treatment options and is in agreement with the current care plan.  All questions were answered. The patient knows to call the clinic with any problems, questions or concerns. We  can certainly see the patient much sooner if necessary.  Disclaimer: This note was dictated with voice recognition software. Similar sounding words can inadvertently be transcribed and may be missed upon review.

## 2013-12-13 ENCOUNTER — Telehealth: Payer: Self-pay | Admitting: Internal Medicine

## 2013-12-13 NOTE — Telephone Encounter (Signed)
lvm for pt regarding to OCT appt ...mailed pt appt sched/avs and letter °

## 2013-12-14 ENCOUNTER — Other Ambulatory Visit: Payer: Self-pay | Admitting: Medical Oncology

## 2013-12-14 ENCOUNTER — Telehealth: Payer: Self-pay | Admitting: Medical Oncology

## 2013-12-14 DIAGNOSIS — C3490 Malignant neoplasm of unspecified part of unspecified bronchus or lung: Secondary | ICD-10-CM

## 2013-12-14 MED ORDER — IPRATROPIUM-ALBUTEROL 20-100 MCG/ACT IN AERS: 1.0000 | INHALATION_SPRAY | Freq: Four times a day (QID) | RESPIRATORY_TRACT | Status: DC | PRN

## 2013-12-14 NOTE — Telephone Encounter (Signed)
Returned her call - she said she is wheezing and would like a refill on an inhaler she had several months ago.  Per [facility] Albuterol refilled.

## 2013-12-18 ENCOUNTER — Other Ambulatory Visit: Payer: Self-pay | Admitting: Medical Oncology

## 2013-12-18 DIAGNOSIS — G4700 Insomnia, unspecified: Secondary | ICD-10-CM

## 2013-12-18 MED ORDER — TEMAZEPAM 15 MG PO CAPS: 15.0000 mg | ORAL_CAPSULE | Freq: Every evening | ORAL | Status: DC | PRN

## 2014-01-02 ENCOUNTER — Other Ambulatory Visit: Payer: BC Managed Care – PPO

## 2014-01-02 ENCOUNTER — Other Ambulatory Visit: Payer: Self-pay | Admitting: Internal Medicine

## 2014-01-07 ENCOUNTER — Telehealth: Payer: Self-pay | Admitting: Internal Medicine

## 2014-01-07 NOTE — Telephone Encounter (Signed)
pt called to r/s appt to same day as lab and inj....pt aware of d.t

## 2014-01-09 ENCOUNTER — Telehealth: Payer: Self-pay | Admitting: Medical Oncology

## 2014-01-09 ENCOUNTER — Ambulatory Visit: Payer: BC Managed Care – PPO

## 2014-01-09 ENCOUNTER — Ambulatory Visit: Payer: BC Managed Care – PPO | Admitting: Internal Medicine

## 2014-01-09 ENCOUNTER — Telehealth: Payer: Self-pay | Admitting: Internal Medicine

## 2014-01-09 ENCOUNTER — Other Ambulatory Visit: Payer: BC Managed Care – PPO

## 2014-01-09 ENCOUNTER — Ambulatory Visit (HOSPITAL_COMMUNITY): Payer: BC Managed Care – PPO

## 2014-01-09 ENCOUNTER — Ambulatory Visit: Payer: BC Managed Care – PPO | Admitting: Physician Assistant
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
## 2014-01-10 ENCOUNTER — Other Ambulatory Visit (HOSPITAL_BASED_OUTPATIENT_CLINIC_OR_DEPARTMENT_OTHER): Payer: BC Managed Care – PPO

## 2014-01-10 ENCOUNTER — Ambulatory Visit (HOSPITAL_BASED_OUTPATIENT_CLINIC_OR_DEPARTMENT_OTHER): Payer: BC Managed Care – PPO | Admitting: Physician Assistant

## 2014-01-10 ENCOUNTER — Ambulatory Visit (HOSPITAL_BASED_OUTPATIENT_CLINIC_OR_DEPARTMENT_OTHER): Payer: BC Managed Care – PPO

## 2014-01-10 ENCOUNTER — Ambulatory Visit (HOSPITAL_COMMUNITY)
Admission: RE | Admit: 2014-01-10 | Discharge: 2014-01-10 | Disposition: A | Discharge status: Home / Self Care | Payer: BC Managed Care – PPO | Source: Ambulatory Visit | Attending: Internal Medicine | Admitting: Internal Medicine

## 2014-01-10 ENCOUNTER — Encounter: Payer: Self-pay | Admitting: Physician Assistant

## 2014-01-10 VITALS — BP 156/113 | HR 89 | Temp 98.2°F | Resp 17 | Ht 67.0 in | Wt 257.9 lb

## 2014-01-10 DIAGNOSIS — C7951 Secondary malignant neoplasm of bone: Secondary | ICD-10-CM | POA: Diagnosis not present

## 2014-01-10 DIAGNOSIS — C3411 Malignant neoplasm of upper lobe, right bronchus or lung: Secondary | ICD-10-CM

## 2014-01-10 DIAGNOSIS — K449 Diaphragmatic hernia without obstruction or gangrene: Secondary | ICD-10-CM | POA: Diagnosis not present

## 2014-01-10 DIAGNOSIS — C3491 Malignant neoplasm of unspecified part of right bronchus or lung: Secondary | ICD-10-CM

## 2014-01-10 DIAGNOSIS — I10 Essential (primary) hypertension: Secondary | ICD-10-CM

## 2014-01-10 DIAGNOSIS — I517 Cardiomegaly: Secondary | ICD-10-CM | POA: Insufficient documentation

## 2014-01-10 DIAGNOSIS — Q632 Ectopic kidney: Secondary | ICD-10-CM | POA: Diagnosis not present

## 2014-01-10 DIAGNOSIS — Z23 Encounter for immunization: Secondary | ICD-10-CM

## 2014-01-10 DIAGNOSIS — C3431 Malignant neoplasm of lower lobe, right bronchus or lung: Secondary | ICD-10-CM | POA: Diagnosis present

## 2014-01-10 DIAGNOSIS — K5730 Diverticulosis of large intestine without perforation or abscess without bleeding: Secondary | ICD-10-CM | POA: Diagnosis not present

## 2014-01-10 DIAGNOSIS — I700 Atherosclerosis of aorta: Secondary | ICD-10-CM | POA: Diagnosis not present

## 2014-01-10 DIAGNOSIS — G4700 Insomnia, unspecified: Secondary | ICD-10-CM

## 2014-01-10 DIAGNOSIS — R21 Rash and other nonspecific skin eruption: Secondary | ICD-10-CM

## 2014-01-10 LAB — COMPREHENSIVE METABOLIC PANEL (CC13)
ALT: 21 U/L (ref 0–55)
ANION GAP: 8 meq/L (ref 3–11)
AST: 23 U/L (ref 5–34)
Albumin: 3.3 g/dL — ABNORMAL LOW (ref 3.5–5.0)
Alkaline Phosphatase: 88 U/L (ref 40–150)
BUN: 14.4 mg/dL (ref 7.0–26.0)
CALCIUM: 8.5 mg/dL (ref 8.4–10.4)
CHLORIDE: 110 meq/L — AB (ref 98–109)
CO2: 26 mEq/L (ref 22–29)
CREATININE: 0.8 mg/dL (ref 0.6–1.1)
Glucose: 115 mg/dl (ref 70–140)
Potassium: 4.1 mEq/L (ref 3.5–5.1)
SODIUM: 144 meq/L (ref 136–145)
TOTAL PROTEIN: 7 g/dL (ref 6.4–8.3)
Total Bilirubin: 0.44 mg/dL (ref 0.20–1.20)

## 2014-01-10 LAB — CBC WITH DIFFERENTIAL/PLATELET
BASO%: 1.2 % (ref 0.0–2.0)
Basophils Absolute: 0.1 10*3/uL (ref 0.0–0.1)
EOS%: 0.9 % (ref 0.0–7.0)
Eosinophils Absolute: 0.1 10*3/uL (ref 0.0–0.5)
HCT: 38.3 % (ref 34.8–46.6)
HGB: 12.4 g/dL (ref 11.6–15.9)
LYMPH%: 28.4 % (ref 14.0–49.7)
MCH: 31.6 pg (ref 25.1–34.0)
MCHC: 32.3 g/dL (ref 31.5–36.0)
MCV: 97.7 fL (ref 79.5–101.0)
MONO#: 0.5 10*3/uL (ref 0.1–0.9)
MONO%: 8.6 % (ref 0.0–14.0)
NEUT#: 3.3 10*3/uL (ref 1.5–6.5)
NEUT%: 60.9 % (ref 38.4–76.8)
Platelets: 253 10*3/uL (ref 145–400)
RBC: 3.92 10*6/uL (ref 3.70–5.45)
RDW: 16.3 % — ABNORMAL HIGH (ref 11.2–14.5)
WBC: 5.5 10*3/uL (ref 3.9–10.3)
lymph#: 1.6 10*3/uL (ref 0.9–3.3)

## 2014-01-10 MED ORDER — TEMAZEPAM 15 MG PO CAPS: 15.0000 mg | ORAL_CAPSULE | Freq: Every evening | ORAL | Status: DC | PRN

## 2014-01-10 MED ORDER — IOHEXOL 300 MG/ML  SOLN
100.0000 mL | Freq: Once | INTRAMUSCULAR | Status: AC | PRN
  Administered 2014-01-10: 100 mL via INTRAVENOUS

## 2014-01-10 MED ORDER — INFLUENZA VAC SPLIT QUAD 0.5 ML IM SUSY
0.5000 mL | PREFILLED_SYRINGE | Freq: Once | INTRAMUSCULAR | Status: AC
  Administered 2014-01-10: 0.5 mL via INTRAMUSCULAR
  Filled 2014-01-10: qty 0.5

## 2014-01-10 MED ORDER — DENOSUMAB 120 MG/1.7ML ~~LOC~~ SOLN
120.0000 mg | Freq: Once | SUBCUTANEOUS | Status: AC
  Administered 2014-01-10: 120 mg via SUBCUTANEOUS
  Filled 2014-01-10: qty 1.7

## 2014-01-10 MED ORDER — CLONIDINE HCL 0.1 MG PO TABS: ORAL_TABLET | ORAL | Status: DC

## 2014-01-10 MED ORDER — OXYCODONE HCL 10 MG PO TABS: 10.0000 mg | ORAL_TABLET | Freq: Four times a day (QID) | ORAL | Status: DC | PRN

## 2014-01-10 NOTE — Progress Notes (Signed)
[place] [phone]   [phone]  OFFICE PROGRESS NOTE  [name], MD [place] [address]  DIAGNOSIS AND STAGE: Metastatic non-small cell lung cancer, adenocarcinoma with positive EGFR mutation in exon 19 diagnosed in June of 2013   PRIOR THERAPY: palliative radiotherapy to the right lung under the care of Dr. Valere Dross expected to be completed on 10/29/2011.   CURRENT THERAPY:  1. [name] 150 mg by mouth daily started 10/11/2011. Status post 27 months of therapy 2. Xgeva 120 mg subcutaneously every 4 weeks.  CHEMOTHERAPY INTENT: Palliative  CURRENT # OF CHEMOTHERAPY CYCLES: 27  CURRENT ANTIEMETICS: Compazine  CURRENT SMOKING STATUS: Former smoker but quit.  ORAL CHEMOTHERAPY AND CONSENT: Oral [name]  CURRENT BISPHOSPHONATES USE: Xgeva  PAIN MANAGEMENT: well-controlled with oxycodone when necessary.  NARCOTICS INDUCED CONSTIPATION: None  LIVING WILL AND CODE STATUS: Full code.   INTERVAL HISTORY: Beth [name] 53 y.o. female returns to the clinic today for followup visit. She denied having any significant skin rash and only has few episodes of diarrhea every now and then. The patient denied having any significant weight loss or night sweats. She is tolerating her treatment with [name] fairly well. She is seen today to receive her Xgeva injection and have repeat lab work for evaluation of her treatment. She is scheduled to have a restaging CT scan of her chest, abdomen and pelvis with contrast later this morning. She admits to not taking her blood pressure medication this morning. She reports increased stress related to her dog. She states her daughter has cancer and she will likely have to have him put them in the next day or two. She requests a refill for her oxycodone and Restoril medications.  MEDICAL HISTORY: Past Medical History  Diagnosis Date  . Asthma   . GERD (gastroesophageal reflux disease)     . Hypertension   . Heart murmur   . Fibromyalgia   . Arthritis of knee, degenerative   . Endocarditis     as teenager  . Complication of anesthesia     difficulty waking up  . Shortness of breath   . Lung mass     R- ADENOCARCINOMA  . Sleep apnea     no longer using CPAP  . Pleural effusion 08/30/11  . Anxiety   . Depression   . History of radiation therapy 09/29/11-11/04/2011    right lung 2700cGy 15 sessions  . Osseous metastasis 09/20/11    per PET scan    ALLERGIES:  is allergic to meloxicam.  MEDICATIONS:  Current Outpatient Prescriptions  Medication Sig Dispense Refill  . azithromycin (ZITHROMAX) 250 MG tablet Use as instructed  6 each  0  . diphenhydrAMINE (BENADRYL) 25 mg capsule One to two tabs every 6 hours prn allergies  120 capsule  3  . diphenoxylate-atropine (LOMOTIL) 2.5-0.025 MG per tablet Take 1 tablet by mouth 4 (four) times daily as needed for diarrhea or loose stools.  30 tablet  1  . Fe Fum-FePoly-Vit C-Vit B3 (INTEGRA) 62.5-62.5-40-3 MG CAPS Take 1 tablet by mouth daily.  30 capsule  3  . fish oil-omega-3 fatty acids 1000 MG capsule Take 2 g by mouth daily.      Marland Kitchen guaiFENesin-codeine (ROBITUSSIN AC) 100-10 MG/5ML syrup Take 5 mLs by mouth 3 (three) times daily as needed for cough.  120 mL  0  . hydrochlorothiazide (HYDRODIURIL) 25 MG tablet TAKE 1 TABLET BY MOUTH DAILY  30 tablet  5  . Ipratropium-Albuterol (COMBIVENT) 20-100 MCG/ACT AERS respimat Inhale 1 puff into the lungs every 6 (six) hours as needed for wheezing.  1 Inhaler  2  . loperamide (IMODIUM) 2 MG capsule Take 2 mg by mouth 4 (four) times daily as needed for diarrhea or loose stools.      . Multiple Vitamins-Minerals (MULTIVITAMIN WITH MINERALS) tablet Take 1 tablet by mouth daily.      . Oxycodone HCl 10 MG TABS Take 1 tablet (10 mg total) by mouth every 6 (six) hours as needed.  60 tablet  0  . prochlorperazine (COMPAZINE) 10 MG tablet Take 1 tablet (10 mg total) by mouth every 6 (six) hours as  needed.  30 tablet  1  . [name] 150 MG tablet TAKE ONE TABLET (150 MG) DAILY. TAKE ON AN EMPTY STOMACH 1 HOUR BEFORE OR 2 HOURS AFTER MEALS  30 tablet  1  . temazepam (RESTORIL) 15 MG capsule Take 1 capsule (15 mg total) by mouth at bedtime as needed for sleep.  30 capsule  0  . venlafaxine XR (EFFEXOR-XR) 150 MG 24 hr capsule TAKE ONE CAPSULE BY MOUTH EVERY DAY  30 capsule  5  . cloNIDine (CATAPRES) 0.1 MG tablet Take 1 tablet by mouth daily as needed for diastolic blood pressure over 100  10 tablet  0   No current facility-administered medications for this visit.    SURGICAL HISTORY:  Past Surgical History  Procedure Laterality Date  . Tubal ligation  2002  . Thoracentesis  09/02/11, 09/09/11    right-side pleural effusion    REVIEW OF SYSTEMS:  Constitutional: negative Eyes: negative Ears, nose, mouth, throat, and face: negative Respiratory: positive for dyspnea on exertion Cardiovascular: negative Gastrointestinal: negative Genitourinary:negative Integument/breast: negative Hematologic/lymphatic: negative Musculoskeletal:negative Neurological: negative Behavioral/Psych: negative Endocrine: negative Allergic/Immunologic: negative   PHYSICAL EXAMINATION: General appearance: alert, cooperative and no distress Head: Normocephalic, without obvious abnormality, atraumatic Neck: no adenopathy, no JVD, supple, symmetrical, trachea midline and thyroid not enlarged, symmetric, no tenderness/mass/nodules Lymph nodes: Cervical, supraclavicular, and axillary nodes normal. Resp: diminished breath sounds RLL and dullness to percussion RLL Back: symmetric, no curvature. ROM normal. No CVA tenderness. Cardio: regular rate and rhythm, S1, S2 normal, no murmur, click, rub or gallop GI: soft, non-tender; bowel sounds normal; no masses,  no organomegaly Extremities: extremities normal, atraumatic, no cyanosis or edema. There is darkening of the skin and swelling of the right fourth  toe. Neurologic: Alert and oriented X 3, normal strength and tone. Normal symmetric reflexes. Normal coordination and gait  ECOG PERFORMANCE STATUS: 1 - Symptomatic but completely ambulatory  Blood pressure 156/113, pulse 89, temperature 98.2 F (36.8 C), temperature source Oral, resp. rate 17, height _0  (1.702 m), weight 257 lb 14.4 oz (116.983 kg), SpO2 98.00%.  LABORATORY DATA: Lab Results  Component Value Date   WBC 5.5 01/10/2014   HGB 12.4 01/10/2014   HCT 38.3 01/10/2014   MCV 97.7 01/10/2014   PLT 253 01/10/2014      Chemistry      Component Value Date/Time   NA 144 01/10/2014 0827   NA 139 05/23/2012 1305   K 4.1 01/10/2014 0827   K 4.0 05/23/2012 1305   CL 104 08/18/2012 1154   CL 100 05/23/2012 1305   CO2 26 01/10/2014 0827   CO2 30 05/23/2012 1305   BUN 14.4 01/10/2014 0827   BUN 16 05/23/2012 1305   CREATININE 0.8 01/10/2014 0827   CREATININE 0.74 05/23/2012 1305  Component Value Date/Time   CALCIUM 8.5 01/10/2014 0827   CALCIUM 9.0 05/23/2012 1305   ALKPHOS 88 01/10/2014 0827   ALKPHOS 113 05/23/2012 1305   AST 23 01/10/2014 0827   AST 15 05/23/2012 1305   ALT 21 01/10/2014 0827   ALT 7 05/23/2012 1305   BILITOT 0.44 01/10/2014 0827   BILITOT 0.6 05/23/2012 1305       RADIOGRAPHIC STUDIES: Ct Chest W Contrast  01/10/2014   CLINICAL DATA:  Stage IV adenocarcinoma of the right lung, diagnosed 08/2011, XRT complete, [name] in progress  EXAM: CT CHEST, ABDOMEN, AND PELVIS WITH CONTRAST  TECHNIQUE: Multidetector CT imaging of the chest, abdomen and pelvis was performed following the standard protocol during bolus administration of intravenous contrast.  CONTRAST:  134mL OMNIPAQUE IOHEXOL 300 MG/ML  SOLN  COMPARISON:  10/17/2013  FINDINGS: CT CHEST FINDINGS  Volume loss with radiation changes/scarring in the right hemithorax. Focal opacity anteriorly in the right lower lobe measures approximately 1.8 x 2.8 cm (series 2/ image 32), likely reflecting treated tumor/scarring,  unchanged. Chronic pleural thickening/enhancement with effusion on the right.  Left lung is clear. No suspicious pulmonary nodules. No pleural effusion.  Visualized thyroid is unremarkable.  Cardiomegaly with rightward cardiomediastinal shift. No pericardial effusion. Atherosclerotic calcifications of the aortic root/valve and mild atherosclerosis of the aortic arch.  No suspicious mediastinal, hilar, or axillary lymphadenopathy.  Sclerotic osseous metastases throughout the visualized axial and appendicular skeleton, grossly unchanged.  CT ABDOMEN AND PELVIS FINDINGS  Hepatobiliary: Liver is notable for suspected focal fat/altered perfusion along the falciform ligament (series 2/image 63). Additional 6 mm hypoenhancing lesion in the central liver (series 2/image 53) is too small to characterize but unchanged, likely benign.  Gallbladder is unremarkable. No intrahepatic or extrahepatic ductal dilatation.  Pancreas: Within normal limits.  Spleen: Within normal limits.  Adrenals/Urinary Tract: Adrenal glands are unremarkable.  Right kidney is malrotated. Bilateral kidneys are otherwise within normal limits. No hydronephrosis.  Bladder is within normal limits.  Stomach/Bowel: Stomach is unremarkable.  No evidence of bowel obstruction. Colonic diverticulosis, without associated inflammatory changes.  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  Vascular/Lymphatic: Atherosclerotic calcifications of the abdominal aorta and branch vessels.  No suspicious abdominopelvic lymphadenopathy.  Reproductive: Heterogeneous appearance of the uterus with suspected uterine fibroids.  Bilateral ovaries are within normal limits.  Other: No abdominopelvic ascites.  Musculoskeletal: Widespread osseous metastases throughout the visualized axial and appendicular skeleton, grossly unchanged.  IMPRESSION: Post treatment changes in the right hemithorax.  Stable 2.8 cm nodular opacity in the anterior right lower  lobe, favored to reflect residual treated tumor/scarring.  Widespread sclerotic metastases throughout the visualized axial and appendicular skeleton, grossly unchanged.  No evidence of new/progressive metastatic disease.   Electronically Signed   By: Julian Hy M.D.   On: 01/10/2014 13:05   Ct Abdomen Pelvis W Contrast  01/10/2014   CLINICAL DATA:  Stage IV adenocarcinoma of the right lung, diagnosed 08/2011, XRT complete, [name] in progress  EXAM: CT CHEST, ABDOMEN, AND PELVIS WITH CONTRAST  TECHNIQUE: Multidetector CT imaging of the chest, abdomen and pelvis was performed following the standard protocol during bolus administration of intravenous contrast.  CONTRAST:  12mL OMNIPAQUE IOHEXOL 300 MG/ML  SOLN  COMPARISON:  10/17/2013  FINDINGS: CT CHEST FINDINGS  Volume loss with radiation changes/scarring in the right hemithorax. Focal opacity anteriorly in the right lower lobe measures approximately 1.8 x 2.8 cm (series 2/ image 32), likely reflecting treated tumor/scarring, unchanged. Chronic pleural  thickening/enhancement with effusion on the right.  Left lung is clear. No suspicious pulmonary nodules. No pleural effusion.  Visualized thyroid is unremarkable.  Cardiomegaly with rightward cardiomediastinal shift. No pericardial effusion. Atherosclerotic calcifications of the aortic root/valve and mild atherosclerosis of the aortic arch.  No suspicious mediastinal, hilar, or axillary lymphadenopathy.  Sclerotic osseous metastases throughout the visualized axial and appendicular skeleton, grossly unchanged.  CT ABDOMEN AND PELVIS FINDINGS  Hepatobiliary: Liver is notable for suspected focal fat/altered perfusion along the falciform ligament (series 2/image 63). Additional 6 mm hypoenhancing lesion in the central liver (series 2/image 53) is too small to characterize but unchanged, likely benign.  Gallbladder is unremarkable. No intrahepatic or extrahepatic ductal dilatation.  Pancreas: Within normal  limits.  Spleen: Within normal limits.  Adrenals/Urinary Tract: Adrenal glands are unremarkable.  Right kidney is malrotated. Bilateral kidneys are otherwise within normal limits. No hydronephrosis.  Bladder is within normal limits.  Stomach/Bowel: Stomach is unremarkable.  No evidence of bowel obstruction. Colonic diverticulosis, without associated inflammatory changes.  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  Vascular/Lymphatic: Atherosclerotic calcifications of the abdominal aorta and branch vessels.  No suspicious abdominopelvic lymphadenopathy.  Reproductive: Heterogeneous appearance of the uterus with suspected uterine fibroids.  Bilateral ovaries are within normal limits.  Other: No abdominopelvic ascites.  Musculoskeletal: Widespread osseous metastases throughout the visualized axial and appendicular skeleton, grossly unchanged.  IMPRESSION: Post treatment changes in the right hemithorax.  Stable 2.8 cm nodular opacity in the anterior right lower lobe, favored to reflect residual treated tumor/scarring.  Widespread sclerotic metastases throughout the visualized axial and appendicular skeleton, grossly unchanged.  No evidence of new/progressive metastatic disease.   Electronically Signed   By: Julian Hy M.D.   On: 01/10/2014 13:05   ASSESSMENT AND PLAN: This is a very pleasant 53 years old [place] American female with:  1)  metastatic non-small cell lung cancer, adenocarcinoma with positive EGFR mutation in exon 19 currently undergoing systemic treatment with oral [name] status post 27 months of treatment. The patient is tolerating her treatment fairly well with no significant adverse effect except for mild diarrhea.  Patient was discussed with and also seen by Dr. Julien Nordmann. Her CT scan results became available revealed stable disease. She will continue on [name] 150 mg by mouth daily.  2) metastatic bone disease:  the patient will continue on Xgeva 120 mg  subcutaneously every 4 weeks.  3) hypertension: The patient was advised to take her blood pressure medication as prescribed. She was given a prescription for clonidine 0.1 mg tablets to be taken 1 tablet daily for diastolic blood pressures over 100. Only a bridge prescription until she can see her primary care physician to adjust her blood pressure medications. Patient voiced understanding.  She was given refill prescriptions for her oxycodone and Restoril medications.  The patient voices understanding of current disease status and treatment options and is in agreement with the current care plan.  All questions were answered. The patient knows to call the clinic with any problems, questions or concerns. We can certainly see the patient much sooner if necessary.  Carlton Adam PA-C  ADDENDUM:  Hematology/Oncology Attending:  I had a face to face encounter with the patient. I recommended her care plan. This is a very pleasant 53 years old African American female with metastatic non-small cell lung cancer, adenocarcinoma currently on treatment with [name] 150 mg by mouth daily status post 26 months and tolerating her treatment fairly well with no  significant adverse effects.  The patient has very mild skin rash and no significant episodes of diarrhea.her blood pressure is uncontrolled and I recommended for the patient to take her blood pressure medicine as recommended by her primary care physician and she was advised to contact him for adjustment of her medications. In the meantime was started the patient on clonidine 0.1 mg by mouth as needed for hypertension until she sees her primary care physician. The patient had repeat CT scan of the chest, abdomen and pelvis performed later today and it showed no evidence for disease progression. She will continue her current treatment with [name] as scheduled. The patient would come back for followup visit in one month's for reevaluation and management any  adverse effect of her treatment. She was advised to call immediately she has any concerning symptoms in the interval.  Disclaimer: This note was dictated with voice recognition software. Similar sounding words can inadvertently be transcribed and may be missed upon review. Eilleen Kempf., MD 01/15/2014

## 2014-01-10 NOTE — Patient Instructions (Signed)
Denosumab injection What is this medicine? DENOSUMAB (den oh sue mab) slows bone breakdown. Prolia is used to treat osteoporosis in women after menopause and in men. Xgeva is used to prevent bone fractures and other bone problems caused by cancer bone metastases. Xgeva is also used to treat giant cell tumor of the bone. This medicine may be used for other purposes; ask your health care provider or pharmacist if you have questions. COMMON BRAND NAME(S): Prolia, XGEVA What should I tell my health care provider before I take this medicine? They need to know if you have any of these conditions: -dental disease -eczema -infection or history of infections -kidney disease or on dialysis -low blood calcium or vitamin D -malabsorption syndrome -scheduled to have surgery or tooth extraction -taking medicine that contains denosumab -thyroid or parathyroid disease -an unusual reaction to denosumab, other medicines, foods, dyes, or preservatives -pregnant or trying to get pregnant -breast-feeding How should I use this medicine? This medicine is for injection under the skin. It is given by a health care professional in a hospital or clinic setting. If you are getting Prolia, a special MedGuide will be given to you by the pharmacist with each prescription and refill. Be sure to read this information carefully each time. For Prolia, talk to your pediatrician regarding the use of this medicine in children. Special care may be needed. For Xgeva, talk to your pediatrician regarding the use of this medicine in children. While this drug may be prescribed for children as young as 13 years for selected conditions, precautions do apply. Overdosage: If you think you've taken too much of this medicine contact a poison control center or emergency room at once. Overdosage: If you think you have taken too much of this medicine contact a poison control center or emergency room at once. NOTE: This medicine is only for  you. Do not share this medicine with others. What if I miss a dose? It is important not to miss your dose. Call your doctor or health care professional if you are unable to keep an appointment. What may interact with this medicine? Do not take this medicine with any of the following medications: -other medicines containing denosumab This medicine may also interact with the following medications: -medicines that suppress the immune system -medicines that treat cancer -steroid medicines like prednisone or cortisone This list may not describe all possible interactions. Give your health care provider a list of all the medicines, herbs, non-prescription drugs, or dietary supplements you use. Also tell them if you smoke, drink alcohol, or use illegal drugs. Some items may interact with your medicine. What should I watch for while using this medicine? Visit your doctor or health care professional for regular checks on your progress. Your doctor or health care professional may order blood tests and other tests to see how you are doing. Call your doctor or health care professional if you get a cold or other infection while receiving this medicine. Do not treat yourself. This medicine may decrease your body's ability to fight infection. You should make sure you get enough calcium and vitamin D while you are taking this medicine, unless your doctor tells you not to. Discuss the foods you eat and the vitamins you take with your health care professional. See your dentist regularly. Brush and floss your teeth as directed. Before you have any dental work done, tell your dentist you are receiving this medicine. Do not become pregnant while taking this medicine or for 5 months after stopping   it. Women should inform their doctor if they wish to become pregnant or think they might be pregnant. There is a potential for serious side effects to an unborn child. Talk to your health care professional or pharmacist for more  information. What side effects may I notice from receiving this medicine? Side effects that you should report to your doctor or health care professional as soon as possible: -allergic reactions like skin rash, itching or hives, swelling of the face, lips, or tongue -breathing problems -chest pain -fast, irregular heartbeat -feeling faint or lightheaded, falls -fever, chills, or any other sign of infection -muscle spasms, tightening, or twitches -numbness or tingling -skin blisters or bumps, or is dry, peels, or red -slow healing or unexplained pain in the mouth or jaw -unusual bleeding or bruising Side effects that usually do not require medical attention (Report these to your doctor or health care professional if they continue or are bothersome.): -muscle pain -stomach upset, gas This list may not describe all possible side effects. Call your doctor for medical advice about side effects. You may report side effects to FDA at 1-800-FDA-1088. Where should I keep my medicine? This medicine is only given in a clinic, doctor's office, or other health care setting and will not be stored at home. NOTE: This sheet is a summary. It may not cover all possible information. If you have questions about this medicine, talk to your doctor, pharmacist, or health care provider.  2015, Elsevier/Gold Standard. (2011-09-06 12:37:47) Avian Influenza Viruses Avian influenza or "bird flu" is also known as the H5N1 virus. It occurs naturally in wild and domestic birds. [place] flu is easily spread (contagious) among birds and is deadly to them. Though rare, bird flu can cause disease in humans.  CAUSES  Infected birds can shed the H5N1 virus through:   Feces.  Nasal secretions.  Saliva. Birds become infected when they come into contact with infected birds or contaminated surfaces. The bird flu virus is spread from country to country through international poultry trade or by migrating birds.  MODES OF  TRANSMISSION TO HUMANS The bird flu virus does not normally infect humans. However, the virus can infect humans who have contact with infected birds, breathe in dust or touch surfaces contaminated with the virus. Human-to-human transmission of the H5N1 virus has been rare. The virus lacks the ability to grow itself (replicate) in humans. However, because all influenza viruses can mutate, scientists are concerned the H5N1 virus will someday replicate itself and make human-to-human transmission easier. If this happens, an influenza "pandemic" could occur.  SYMPTOMS   Symptoms of H5N1 virus are similar to other influenza viruses:  Fever.  Cough.  Sore throat.  Nausea and vomiting.  Diarrhea.  Muscle aches.  Tiredness (malaise).  Some people may get inflammation or redness of the eyes (conjunctivitis).  Life-threatening complications may result in the death of the patient. These include:  Viral pneumonia.  Breathing (respiratory) distress syndrome.  Multi-organ failure. DIAGNOSIS   A person with a respiratory illness may be suffering from bird flu if direct or indirect contact has been made with infected birds. This includes handling or taking care of sick birds. The H5N1 virus may also be suspected if a person has breathed in particles or touched surfaces contaminated with the virus.  In addition to the above symptoms, a chest X-ray is useful to detect pneumonia.  Fluid specimens such as a sputum sample may be sent to a laboratory for further investigation.  Blood tests may  be done to help detect the H5N1 virus. TREATMENT   The H5N1 virus has shown resistance to amantadine and rimantadine, which are two antiviral drugs commonly used for other influenza viruses. However, two other antivirals, oseltamivir and zanamivir, seem to be effective against the H5N1 strain.  If bird flu is suspected in a person, treatment should start immediately without waiting for laboratory  confirmation.  Treatment for the H5N1 strain is essentially the same as treating other influenza viruses. PREVENTION   Stay home from work, school, and errands when you are sick. Not being in contact with other people will help stop the spread of illness.  Cover your mouth and nose with your arm when coughing or sneezing. This may help keep those around you from getting sick.  Wash your hands often with warm water and soap. Illnesses are often spread when a person touches something that is contaminated with germs and then touches his or her eyes, nose, or mouth.  Antiviral medications can help prevent the flu.  For optimal health, get plenty of rest, eat a healthy diet, and exercise. Document Released: 03/11/2003 Document Revised: 07/23/2013 Document Reviewed: 10/05/2007 [facility] Patient Information 2015 [place], [place]. This information is not intended to replace advice given to you by your health care provider. Make sure you discuss any questions you have with your health care provider.

## 2014-01-13 NOTE — Patient Instructions (Addendum)
You restaging CT scan revealed stable disease Continue [name] 150 mg by mouth daily Continue Xgeva injections monthly Followup in one month  Be sure to take your blood pressure medication as prescribed. Take the clonidine 0.1 mg tablet, one daily for diastolic blood pressures over 100. Followup with your primary care physician as it is possible to have your blood pressure medication adjusted.

## 2014-01-16 ENCOUNTER — Ambulatory Visit: Payer: BC Managed Care – PPO | Admitting: Internal Medicine

## 2014-02-06 ENCOUNTER — Telehealth: Payer: Self-pay | Admitting: Internal Medicine

## 2014-02-06 NOTE — Telephone Encounter (Signed)
returned pt call and sched missed appts....pt ok adn aware of new d.t...she requested dec appts due to insurance

## 2014-02-08 ENCOUNTER — Telehealth: Payer: Self-pay | Admitting: Medical Oncology

## 2014-02-08 ENCOUNTER — Other Ambulatory Visit: Payer: Self-pay | Admitting: Medical Oncology

## 2014-02-08 DIAGNOSIS — C3491 Malignant neoplasm of unspecified part of right bronchus or lung: Secondary | ICD-10-CM

## 2014-02-08 MED ORDER — OXYCODONE HCL 10 MG PO TABS: 10.0000 mg | ORAL_TABLET | Freq: Four times a day (QID) | ORAL | Status: DC | PRN

## 2014-02-08 NOTE — Telephone Encounter (Signed)
Pt cannotcome in nov due to insurance lapse -she will r/s to dec. She applied for assistance for tarceva. Form faxed to [employer].

## 2014-02-08 NOTE — Telephone Encounter (Addendum)
Pt called back and requested refill for percocet

## 2014-02-15 ENCOUNTER — Telehealth: Payer: Self-pay | Admitting: *Deleted

## 2014-02-15 NOTE — Telephone Encounter (Signed)
Pt called stating that she is taking her pain medication oxycodone TID and the qty is only 60 so it will not get her through an entire 30 days without needing a refill.  She wants to see if she can get a larger qty.  Informed her I was not sure if Dr Vista Mink would prescribe a qty greater then 50 but will check with him and call her back.  She verbalized understanding.

## 2014-02-16 ENCOUNTER — Other Ambulatory Visit: Payer: Self-pay | Admitting: Internal Medicine

## 2014-02-18 ENCOUNTER — Telehealth: Payer: Self-pay | Admitting: *Deleted

## 2014-02-18 ENCOUNTER — Other Ambulatory Visit: Payer: Self-pay | Admitting: Internal Medicine

## 2014-02-18 DIAGNOSIS — C3410 Malignant neoplasm of upper lobe, unspecified bronchus or lung: Secondary | ICD-10-CM

## 2014-02-18 NOTE — Telephone Encounter (Signed)
Pt called requesting a qty of 90 tablets of her oxycodone.  She has been taking 3 tabs a day.  Per Dr Vista Mink okay to refill for qty of 90 at office visits.  Left msg on vm with instructions.

## 2014-02-18 NOTE — Telephone Encounter (Signed)
Refill called to pharmacy.

## 2014-02-18 NOTE — Telephone Encounter (Signed)
Refill authorized by provider but was printed and still in Rx Folder.  Called Pharmacy leaving refill on pharmacy voicemail of [facility] [place].

## 2014-02-18 NOTE — Telephone Encounter (Signed)
Form re-faxed to [employer] with ICD-10 Code.

## 2014-02-20 ENCOUNTER — Telehealth: Payer: Self-pay | Admitting: *Deleted

## 2014-02-20 ENCOUNTER — Ambulatory Visit: Payer: Medicare Other

## 2014-02-20 NOTE — Telephone Encounter (Signed)
Pt called stating that her puppy chewed up her oxycodone tablets and she only has 5 left.  Her puppy is ok but had to get his stomach pumped at the vet.  She wants to know if she can have a refill early for the oxycodone.  Per Dr Vista Mink, we need a copy of the vet report sent to [place] for review, then we can give enough of a supply to last until next f/u visit 12/9.

## 2014-02-21 ENCOUNTER — Telehealth: Payer: Self-pay | Admitting: Internal Medicine

## 2014-02-21 DIAGNOSIS — F319 Bipolar disorder, unspecified: Secondary | ICD-10-CM | POA: Diagnosis not present

## 2014-02-21 DIAGNOSIS — R7309 Other abnormal glucose: Secondary | ICD-10-CM | POA: Diagnosis not present

## 2014-02-21 DIAGNOSIS — I10 Essential (primary) hypertension: Secondary | ICD-10-CM | POA: Diagnosis not present

## 2014-02-21 DIAGNOSIS — C3490 Malignant neoplasm of unspecified part of unspecified bronchus or lung: Secondary | ICD-10-CM | POA: Diagnosis not present

## 2014-02-21 DIAGNOSIS — E559 Vitamin D deficiency, unspecified: Secondary | ICD-10-CM | POA: Diagnosis not present

## 2014-02-21 DIAGNOSIS — M797 Fibromyalgia: Secondary | ICD-10-CM | POA: Diagnosis not present

## 2014-02-21 DIAGNOSIS — R011 Cardiac murmur, unspecified: Secondary | ICD-10-CM | POA: Diagnosis not present

## 2014-02-21 NOTE — Telephone Encounter (Signed)
Received call from patient stating that we can disregard any records from the vet regarding her puppy getting his stomach pumped.  She does not have insurance right now so she states she will wait to get her pain medication refilled.

## 2014-02-21 NOTE — Telephone Encounter (Signed)
pt called to r/s appt..done...pt aware of new d.t °

## 2014-02-25 ENCOUNTER — Other Ambulatory Visit (HOSPITAL_BASED_OUTPATIENT_CLINIC_OR_DEPARTMENT_OTHER): Payer: Medicare Other

## 2014-02-25 ENCOUNTER — Ambulatory Visit (HOSPITAL_BASED_OUTPATIENT_CLINIC_OR_DEPARTMENT_OTHER): Payer: Medicare Other | Admitting: Internal Medicine

## 2014-02-25 ENCOUNTER — Encounter: Payer: Self-pay | Admitting: Internal Medicine

## 2014-02-25 ENCOUNTER — Ambulatory Visit (HOSPITAL_BASED_OUTPATIENT_CLINIC_OR_DEPARTMENT_OTHER): Payer: Medicare Other

## 2014-02-25 VITALS — BP 141/95 | HR 97 | Temp 98.6°F | Resp 18 | Ht 67.0 in | Wt 252.1 lb

## 2014-02-25 DIAGNOSIS — C3491 Malignant neoplasm of unspecified part of right bronchus or lung: Secondary | ICD-10-CM

## 2014-02-25 DIAGNOSIS — C7951 Secondary malignant neoplasm of bone: Secondary | ICD-10-CM

## 2014-02-25 DIAGNOSIS — C3490 Malignant neoplasm of unspecified part of unspecified bronchus or lung: Secondary | ICD-10-CM

## 2014-02-25 DIAGNOSIS — G4700 Insomnia, unspecified: Secondary | ICD-10-CM | POA: Diagnosis not present

## 2014-02-25 DIAGNOSIS — D023 Carcinoma in situ of other parts of respiratory system: Secondary | ICD-10-CM | POA: Diagnosis not present

## 2014-02-25 DIAGNOSIS — M797 Fibromyalgia: Secondary | ICD-10-CM | POA: Diagnosis not present

## 2014-02-25 LAB — CBC WITH DIFFERENTIAL/PLATELET
BASO%: 0.9 % (ref 0.0–2.0)
Basophils Absolute: 0.1 10*3/uL (ref 0.0–0.1)
EOS ABS: 0.1 10*3/uL (ref 0.0–0.5)
EOS%: 1.5 % (ref 0.0–7.0)
HCT: 40.9 % (ref 34.8–46.6)
HGB: 13.1 g/dL (ref 11.6–15.9)
LYMPH%: 27.3 % (ref 14.0–49.7)
MCH: 31.2 pg (ref 25.1–34.0)
MCHC: 32 g/dL (ref 31.5–36.0)
MCV: 97.5 fL (ref 79.5–101.0)
MONO#: 0.5 10*3/uL (ref 0.1–0.9)
MONO%: 6.8 % (ref 0.0–14.0)
NEUT%: 63.5 % (ref 38.4–76.8)
NEUTROS ABS: 4.9 10*3/uL (ref 1.5–6.5)
Platelets: 279 10*3/uL (ref 145–400)
RBC: 4.2 10*6/uL (ref 3.70–5.45)
RDW: 15.5 % — AB (ref 11.2–14.5)
WBC: 7.6 10*3/uL (ref 3.9–10.3)
lymph#: 2.1 10*3/uL (ref 0.9–3.3)

## 2014-02-25 LAB — COMPREHENSIVE METABOLIC PANEL (CC13)
ALBUMIN: 3.8 g/dL (ref 3.5–5.0)
ALK PHOS: 85 U/L (ref 40–150)
ALT: 20 U/L (ref 0–55)
AST: 22 U/L (ref 5–34)
Anion Gap: 10 mEq/L (ref 3–11)
BILIRUBIN TOTAL: 0.68 mg/dL (ref 0.20–1.20)
BUN: 12.6 mg/dL (ref 7.0–26.0)
CO2: 29 meq/L (ref 22–29)
Calcium: 9.3 mg/dL (ref 8.4–10.4)
Chloride: 102 mEq/L (ref 98–109)
Creatinine: 0.9 mg/dL (ref 0.6–1.1)
EGFR: 90 mL/min/{1.73_m2} — AB (ref >90)
GLUCOSE: 114 mg/dL (ref 70–140)
POTASSIUM: 3.7 meq/L (ref 3.5–5.1)
Sodium: 141 mEq/L (ref 136–145)
TOTAL PROTEIN: 7.3 g/dL (ref 6.4–8.3)

## 2014-02-25 MED ORDER — DENOSUMAB 120 MG/1.7ML ~~LOC~~ SOLN
120.0000 mg | Freq: Once | SUBCUTANEOUS | Status: AC
  Administered 2014-02-25: 120 mg via SUBCUTANEOUS
  Filled 2014-02-25: qty 1.7

## 2014-02-25 MED ORDER — OXYCODONE HCL 10 MG PO TABS: 10.0000 mg | ORAL_TABLET | Freq: Four times a day (QID) | ORAL | Status: DC | PRN

## 2014-02-25 NOTE — Progress Notes (Signed)
[place] [phone]   [phone]) [phone]  OFFICE PROGRESS NOTE  [name], MD [place] [address]  DIAGNOSIS AND STAGE: Metastatic non-small cell lung cancer, adenocarcinoma with positive EGFR mutation in exon 19 diagnosed in June of 2013   PRIOR THERAPY: palliative radiotherapy to the right lung under the care of Dr. Valere Dross expected to be completed on 10/29/2011.   CURRENT THERAPY:  1. [name] 150 mg by mouth daily started 10/11/2011. Status post 28 months of therapy 2. Xgeva 120 mg subcutaneously every 4 weeks.  CHEMOTHERAPY INTENT: Palliative  CURRENT # OF CHEMOTHERAPY CYCLES: 29 CURRENT ANTIEMETICS: Compazine  CURRENT SMOKING STATUS: Former smoker but quit.  ORAL CHEMOTHERAPY AND CONSENT: Oral [name]  CURRENT BISPHOSPHONATES USE: Xgeva  PAIN MANAGEMENT: well-controlled with oxycodone when necessary.  NARCOTICS INDUCED CONSTIPATION: None  LIVING WILL AND CODE STATUS: Full code.   INTERVAL HISTORY: Beth [name] 53 y.o. female returns to the clinic today for followup visit. The patient is feeling fine today was no specific complaints except for occasional pain in her lower extremities. She denied having any significant chest pain, shortness breath, cough or hemoptysis. She denied having any significant skin rash and only has few episodes of diarrhea every now and then. The patient denied having any significant weight loss or night sweats. She is tolerating her treatment with [name] fairly well. She is seen today to receive her Xgeva injection and have repeat lab work for evaluation of her treatment.  MEDICAL HISTORY: Past Medical History  Diagnosis Date  . Asthma   . GERD (gastroesophageal reflux disease)   . Hypertension   . Heart murmur   . Fibromyalgia   . Arthritis of knee, degenerative   . Endocarditis     as teenager  . Complication of anesthesia     difficulty waking up  . Shortness of  breath   . Lung mass     R- ADENOCARCINOMA  . Sleep apnea     no longer using CPAP  . Pleural effusion 08/30/11  . Anxiety   . Depression   . History of radiation therapy 09/29/11-11/04/2011    right lung 2700cGy 15 sessions  . Osseous metastasis 09/20/11    per PET scan    ALLERGIES:  is allergic to meloxicam.  MEDICATIONS:  Current Outpatient Prescriptions  Medication Sig Dispense Refill  . cloNIDine (CATAPRES) 0.1 MG tablet Take 1 tablet by mouth daily as needed for diastolic blood pressure over 100 10 tablet 0  . diphenhydrAMINE (BENADRYL) 25 mg capsule One to two tabs every 6 hours prn allergies 120 capsule 3  . Fe Fum-FePoly-Vit C-Vit B3 (INTEGRA) 62.5-62.5-40-3 MG CAPS Take 1 tablet by mouth daily. 30 capsule 3  . fish oil-omega-3 fatty acids 1000 MG capsule Take 2 g by mouth daily.    Marland Kitchen guaiFENesin-codeine (ROBITUSSIN AC) 100-10 MG/5ML syrup Take 5 mLs by mouth 3 (three) times daily as needed for cough. 120 mL 0  . hydrochlorothiazide (HYDRODIURIL) 25 MG tablet TAKE 1 TABLET BY MOUTH DAILY 30 tablet 5  . loperamide (IMODIUM) 2 MG capsule Take 2 mg by mouth 4 (four) times daily as needed for diarrhea or loose stools.    . Multiple Vitamins-Minerals (MULTIVITAMIN WITH MINERALS) tablet Take 1 tablet by mouth daily.    . Oxycodone HCl 10 MG TABS Take 1 tablet (10 mg total) by mouth every 6 (six) hours as needed. 60 tablet 0  . [name] 150 MG tablet TAKE  ONE TABLET (150 MG) DAILY. TAKE ON AN EMPTY STOMACH 1 HOUR BEFORE OR 2 HOURS AFTER MEALS 30 tablet 1  . temazepam (RESTORIL) 15 MG capsule TAKE ONE CAPSULE AT BEDTIME AS NEEDED 30 capsule 0  . venlafaxine XR (EFFEXOR-XR) 150 MG 24 hr capsule TAKE ONE CAPSULE BY MOUTH EVERY DAY 30 capsule 5  . Ipratropium-Albuterol (COMBIVENT) 20-100 MCG/ACT AERS respimat Inhale 1 puff into the lungs every 6 (six) hours as needed for wheezing. (Patient not taking: Reported on 02/25/2014) 1 Inhaler 2  . prochlorperazine (COMPAZINE) 10 MG tablet Take 1  tablet (10 mg total) by mouth every 6 (six) hours as needed. (Patient not taking: Reported on 02/25/2014) 30 tablet 1   No current facility-administered medications for this visit.   Facility-Administered Medications Ordered in Other Visits  Medication Dose Route Frequency Provider Last Rate Last Dose  . denosumab (XGEVA) injection 120 mg  120 mg Subcutaneous Once Owens Shark, NP        SURGICAL HISTORY:  Past Surgical History  Procedure Laterality Date  . Tubal ligation  2002  . Thoracentesis  09/02/11, 09/09/11    right-side pleural effusion    REVIEW OF SYSTEMS:  Constitutional: negative Eyes: negative Ears, nose, mouth, throat, and face: negative Respiratory: positive for dyspnea on exertion Cardiovascular: negative Gastrointestinal: negative Genitourinary:negative Integument/breast: negative Hematologic/lymphatic: negative Musculoskeletal:negative Neurological: negative Behavioral/Psych: negative Endocrine: negative Allergic/Immunologic: negative   PHYSICAL EXAMINATION: General appearance: alert, cooperative and no distress Head: Normocephalic, without obvious abnormality, atraumatic Neck: no adenopathy, no JVD, supple, symmetrical, trachea midline and thyroid not enlarged, symmetric, no tenderness/mass/nodules Lymph nodes: Cervical, supraclavicular, and axillary nodes normal. Resp: diminished breath sounds RLL and dullness to percussion RLL Back: symmetric, no curvature. ROM normal. No CVA tenderness. Cardio: regular rate and rhythm, S1, S2 normal, no murmur, click, rub or gallop GI: soft, non-tender; bowel sounds normal; no masses,  no organomegaly Extremities: extremities normal, atraumatic, no cyanosis or edema. There is darkening of the skin and swelling of the right fourth toe. Neurologic: Alert and oriented X 3, normal strength and tone. Normal symmetric reflexes. Normal coordination and gait  ECOG PERFORMANCE STATUS: 1 - Symptomatic but completely  ambulatory  Blood pressure 141/95, pulse 97, temperature 98.6 F (37 C), temperature source Oral, resp. rate 18, height '5\' 7"'  (1.702 m), weight 252 lb 1.6 oz (114.352 kg), SpO2 99 %.  LABORATORY DATA: Lab Results  Component Value Date   WBC 7.6 02/25/2014   HGB 13.1 02/25/2014   HCT 40.9 02/25/2014   MCV 97.5 02/25/2014   PLT 279 02/25/2014      Chemistry      Component Value Date/Time   NA 141 02/25/2014 1528   NA 139 05/23/2012 1305   K 3.7 02/25/2014 1528   K 4.0 05/23/2012 1305   CL 104 08/18/2012 1154   CL 100 05/23/2012 1305   CO2 29 02/25/2014 1528   CO2 30 05/23/2012 1305   BUN 12.6 02/25/2014 1528   BUN 16 05/23/2012 1305   CREATININE 0.9 02/25/2014 1528   CREATININE 0.74 05/23/2012 1305      Component Value Date/Time   CALCIUM 9.3 02/25/2014 1528   CALCIUM 9.0 05/23/2012 1305   ALKPHOS 85 02/25/2014 1528   ALKPHOS 113 05/23/2012 1305   AST 22 02/25/2014 1528   AST 15 05/23/2012 1305   ALT 20 02/25/2014 1528   ALT 7 05/23/2012 1305   BILITOT 0.68 02/25/2014 1528   BILITOT 0.6 05/23/2012 1305  RADIOGRAPHIC STUDIES:  ASSESSMENT AND PLAN: This is a very pleasant 53 years old [place] female with:  1)  metastatic non-small cell lung cancer, adenocarcinoma with positive EGFR mutation in exon 19 currently undergoing systemic treatment with oral [name] status post 28 months of treatment. The patient is tolerating her treatment fairly well with no significant adverse effect except for mild diarrhea.  I recommended for her to continue on [name] 150 mg by mouth daily. She would come back for follow-up visit in one month's for reevaluation with repeat blood work. 2) metastatic bone disease:  the patient will continue on Xgeva 120 mg subcutaneously every 4 weeks. She was giving a refill of her pain medication. The patient voices understanding of current disease status and treatment options and is in agreement with the current care plan.  All  questions were answered. The patient knows to call the clinic with any problems, questions or concerns. We can certainly see the patient much sooner if necessary.  Disclaimer: This note was dictated with voice recognition software. Similar sounding words can inadvertently be transcribed and may be missed upon review.

## 2014-02-25 NOTE — Patient Instructions (Signed)
Denosumab injection What is this medicine? DENOSUMAB (den oh sue mab) slows bone breakdown. Prolia is used to treat osteoporosis in women after menopause and in men. Xgeva is used to prevent bone fractures and other bone problems caused by cancer bone metastases. Xgeva is also used to treat giant cell tumor of the bone. This medicine may be used for other purposes; ask your health care provider or pharmacist if you have questions. COMMON BRAND NAME(S): Prolia, XGEVA What should I tell my health care provider before I take this medicine? They need to know if you have any of these conditions: -dental disease -eczema -infection or history of infections -kidney disease or on dialysis -low blood calcium or vitamin D -malabsorption syndrome -scheduled to have surgery or tooth extraction -taking medicine that contains denosumab -thyroid or parathyroid disease -an unusual reaction to denosumab, other medicines, foods, dyes, or preservatives -pregnant or trying to get pregnant -breast-feeding How should I use this medicine? This medicine is for injection under the skin. It is given by a health care professional in a hospital or clinic setting. If you are getting Prolia, a special MedGuide will be given to you by the pharmacist with each prescription and refill. Be sure to read this information carefully each time. For Prolia, talk to your pediatrician regarding the use of this medicine in children. Special care may be needed. For Xgeva, talk to your pediatrician regarding the use of this medicine in children. While this drug may be prescribed for children as young as 13 years for selected conditions, precautions do apply. Overdosage: If you think you've taken too much of this medicine contact a poison control center or emergency room at once. Overdosage: If you think you have taken too much of this medicine contact a poison control center or emergency room at once. NOTE: This medicine is only for  you. Do not share this medicine with others. What if I miss a dose? It is important not to miss your dose. Call your doctor or health care professional if you are unable to keep an appointment. What may interact with this medicine? Do not take this medicine with any of the following medications: -other medicines containing denosumab This medicine may also interact with the following medications: -medicines that suppress the immune system -medicines that treat cancer -steroid medicines like prednisone or cortisone This list may not describe all possible interactions. Give your health care provider a list of all the medicines, herbs, non-prescription drugs, or dietary supplements you use. Also tell them if you smoke, drink alcohol, or use illegal drugs. Some items may interact with your medicine. What should I watch for while using this medicine? Visit your doctor or health care professional for regular checks on your progress. Your doctor or health care professional may order blood tests and other tests to see how you are doing. Call your doctor or health care professional if you get a cold or other infection while receiving this medicine. Do not treat yourself. This medicine may decrease your body's ability to fight infection. You should make sure you get enough calcium and vitamin D while you are taking this medicine, unless your doctor tells you not to. Discuss the foods you eat and the vitamins you take with your health care professional. See your dentist regularly. Brush and floss your teeth as directed. Before you have any dental work done, tell your dentist you are receiving this medicine. Do not become pregnant while taking this medicine or for 5 months after stopping   it. Women should inform their doctor if they wish to become pregnant or think they might be pregnant. There is a potential for serious side effects to an unborn child. Talk to your health care professional or pharmacist for more  information. What side effects may I notice from receiving this medicine? Side effects that you should report to your doctor or health care professional as soon as possible: -allergic reactions like skin rash, itching or hives, swelling of the face, lips, or tongue -breathing problems -chest pain -fast, irregular heartbeat -feeling faint or lightheaded, falls -fever, chills, or any other sign of infection -muscle spasms, tightening, or twitches -numbness or tingling -skin blisters or bumps, or is dry, peels, or red -slow healing or unexplained pain in the mouth or jaw -unusual bleeding or bruising Side effects that usually do not require medical attention (Report these to your doctor or health care professional if they continue or are bothersome.): -muscle pain -stomach upset, gas This list may not describe all possible side effects. Call your doctor for medical advice about side effects. You may report side effects to FDA at 1-800-FDA-1088. Where should I keep my medicine? This medicine is only given in a clinic, doctor's office, or other health care setting and will not be stored at home. NOTE: This sheet is a summary. It may not cover all possible information. If you have questions about this medicine, talk to your doctor, pharmacist, or health care provider.  2015, Elsevier/Gold Standard. (2011-09-06 12:37:47)  

## 2014-02-26 ENCOUNTER — Telehealth: Payer: Self-pay | Admitting: Internal Medicine

## 2014-02-26 NOTE — Telephone Encounter (Signed)
s.w. pt and advised on Jan appt.....pt ok and aware °

## 2014-02-27 ENCOUNTER — Ambulatory Visit: Payer: BC Managed Care – PPO

## 2014-02-27 ENCOUNTER — Other Ambulatory Visit: Payer: BC Managed Care – PPO

## 2014-02-27 ENCOUNTER — Telehealth: Payer: Self-pay | Admitting: Medical Oncology

## 2014-02-27 ENCOUNTER — Ambulatory Visit: Payer: BC Managed Care – PPO | Admitting: Internal Medicine

## 2014-02-27 NOTE — Telephone Encounter (Signed)
[employer] ( case # [id number]) cannot refer pt for drug assistance until her Insurance D plan is terminated and it is still active. Amijee told Darlin this.  They will be happy to re-consider her if her plan D is terminated.

## 2014-03-01 ENCOUNTER — Other Ambulatory Visit: Payer: Self-pay | Admitting: Internal Medicine

## 2014-03-01 DIAGNOSIS — C3490 Malignant neoplasm of unspecified part of unspecified bronchus or lung: Secondary | ICD-10-CM

## 2014-03-04 ENCOUNTER — Encounter: Payer: Self-pay | Admitting: Internal Medicine

## 2014-03-04 NOTE — Progress Notes (Signed)
Insurance is paying [phone]- [email or web address]

## 2014-03-07 ENCOUNTER — Other Ambulatory Visit: Payer: Self-pay | Admitting: Internal Medicine

## 2014-03-12 ENCOUNTER — Other Ambulatory Visit: Payer: Self-pay | Admitting: *Deleted

## 2014-03-12 DIAGNOSIS — C3490 Malignant neoplasm of unspecified part of unspecified bronchus or lung: Secondary | ICD-10-CM

## 2014-03-12 MED ORDER — PROCHLORPERAZINE MALEATE 10 MG PO TABS: 10.0000 mg | ORAL_TABLET | Freq: Four times a day (QID) | ORAL | Status: DC | PRN

## 2014-03-25 ENCOUNTER — Other Ambulatory Visit (HOSPITAL_BASED_OUTPATIENT_CLINIC_OR_DEPARTMENT_OTHER): Payer: Medicare Other

## 2014-03-25 ENCOUNTER — Ambulatory Visit (HOSPITAL_BASED_OUTPATIENT_CLINIC_OR_DEPARTMENT_OTHER): Payer: Medicare Other | Admitting: Nurse Practitioner

## 2014-03-25 ENCOUNTER — Ambulatory Visit: Payer: Medicare Other | Admitting: Physician Assistant

## 2014-03-25 ENCOUNTER — Encounter: Payer: Self-pay | Admitting: Nurse Practitioner

## 2014-03-25 ENCOUNTER — Ambulatory Visit (HOSPITAL_BASED_OUTPATIENT_CLINIC_OR_DEPARTMENT_OTHER): Payer: Medicare Other

## 2014-03-25 VITALS — BP 150/97 | HR 98 | Temp 98.0°F | Resp 19 | Ht 67.0 in | Wt 254.6 lb

## 2014-03-25 DIAGNOSIS — C3491 Malignant neoplasm of unspecified part of right bronchus or lung: Secondary | ICD-10-CM

## 2014-03-25 DIAGNOSIS — C3411 Malignant neoplasm of upper lobe, right bronchus or lung: Secondary | ICD-10-CM

## 2014-03-25 DIAGNOSIS — C7951 Secondary malignant neoplasm of bone: Secondary | ICD-10-CM

## 2014-03-25 DIAGNOSIS — C3410 Malignant neoplasm of upper lobe, unspecified bronchus or lung: Secondary | ICD-10-CM

## 2014-03-25 LAB — COMPREHENSIVE METABOLIC PANEL (CC13)
ALT: 17 U/L (ref 0–55)
ANION GAP: 10 meq/L (ref 3–11)
AST: 21 U/L (ref 5–34)
Albumin: 3.5 g/dL (ref 3.5–5.0)
Alkaline Phosphatase: 84 U/L (ref 40–150)
BUN: 12.3 mg/dL (ref 7.0–26.0)
CALCIUM: 9 mg/dL (ref 8.4–10.4)
CO2: 26 meq/L (ref 22–29)
CREATININE: 0.8 mg/dL (ref 0.6–1.1)
Chloride: 105 mEq/L (ref 98–109)
Glucose: 81 mg/dl (ref 70–140)
Potassium: 3.8 mEq/L (ref 3.5–5.1)
Sodium: 141 mEq/L (ref 136–145)
Total Bilirubin: 0.66 mg/dL (ref 0.20–1.20)
Total Protein: 7.2 g/dL (ref 6.4–8.3)

## 2014-03-25 LAB — CBC WITH DIFFERENTIAL/PLATELET
BASO%: 0.1 % (ref 0.0–2.0)
BASOS ABS: 0 10*3/uL (ref 0.0–0.1)
EOS ABS: 0.1 10*3/uL (ref 0.0–0.5)
EOS%: 1.7 % (ref 0.0–7.0)
HCT: 38.8 % (ref 34.8–46.6)
HGB: 12.6 g/dL (ref 11.6–15.9)
LYMPH%: 29.9 % (ref 14.0–49.7)
MCH: 31.8 pg (ref 25.1–34.0)
MCHC: 32.5 g/dL (ref 31.5–36.0)
MCV: 98 fL (ref 79.5–101.0)
MONO#: 0.4 10*3/uL (ref 0.1–0.9)
MONO%: 6.3 % (ref 0.0–14.0)
NEUT#: 4.3 10*3/uL (ref 1.5–6.5)
NEUT%: 62 % (ref 38.4–76.8)
PLATELETS: 284 10*3/uL (ref 145–400)
RBC: 3.96 10*6/uL (ref 3.70–5.45)
RDW: 14.3 % (ref 11.2–14.5)
WBC: 7 10*3/uL (ref 3.9–10.3)
lymph#: 2.1 10*3/uL (ref 0.9–3.3)

## 2014-03-25 MED ORDER — TEMAZEPAM 15 MG PO CAPS: ORAL_CAPSULE | ORAL | Status: DC

## 2014-03-25 MED ORDER — DENOSUMAB 120 MG/1.7ML ~~LOC~~ SOLN
120.0000 mg | Freq: Once | SUBCUTANEOUS | Status: AC
  Administered 2014-03-25: 120 mg via SUBCUTANEOUS
  Filled 2014-03-25: qty 1.7

## 2014-03-25 MED ORDER — OXYCODONE HCL 10 MG PO TABS: 10.0000 mg | ORAL_TABLET | Freq: Four times a day (QID) | ORAL | Status: DC | PRN

## 2014-03-25 NOTE — Progress Notes (Addendum)
[facility] OFFICE PROGRESS NOTE   Diagnosis:  Metastatic non-small cell lung cancer, adenocarcinoma with positive EGFR mutation in exon 19 diagnosed June 2013  INTERVAL HISTORY:   Ms. Mcgonagle returns as scheduled. She continues [name]. She denies nausea/vomiting. No mouth sores. She has occasional diarrhea. No skin rash. Stable mild exertional dyspnea. She has a slight cough. No fever. She states she is "tired". She has a good appetite. She notes increased "bone pain" in the arms and legs which she attributes to the cold weather. For the past month she has noted fullness in the left supraclavicular region.  Objective:  Vital signs in last 24 hours:  Blood pressure 150/97, pulse 98, temperature 98 F (36.7 C), temperature source Oral, resp. rate 19, height '5\' 7"'  (1.702 m), weight 254 lb 9.6 oz (115.486 kg), SpO2 99 %.    HEENT: No thrush or ulcers. Lymphatics: No palpable cervical, supraclavicular or axillary lymph nodes. Fullness at the left low neck/left supra clavicular region. Resp: Lungs are clear. Breath sounds diminished at the right lower lung field. Cardio: Regular rate and rhythm. GI: Abdomen soft and nontender. No hepatomegaly. Vascular: No leg edema. Calves soft and nontender.  Skin: No rash.    Lab Results:  Lab Results  Component Value Date   WBC 7.0 03/25/2014   HGB 12.6 03/25/2014   HCT 38.8 03/25/2014   MCV 98.0 03/25/2014   PLT 284 03/25/2014   NEUTROABS 4.3 03/25/2014    Imaging:  No results found.  Medications: I have reviewed the patient's current medications.  Assessment/Plan: 1. Metastatic non-small cell lung cancer, adenocarcinoma with positive for EGFR mutation in exon 19 diagnosed June 2013.   [name] 150 mg daily initiated 10/11/2011.   Status post palliative radiation to the right lung completed 10/29/2011.   Restaging CT evaluation 05/25/2013 with similar to decrease in size of right-sided pulmonary nodule and mass.  No evidence of new or progressive disease. Similar osseous metastasis.  Restaging CT evaluation 10/17/2013 with widespread metastatic disease to the bones redemonstrated; posttreatment related changes in the right hemithorax from prior radiation therapy without evidence of significant residual or recurrent disease. No new sites of metastatic disease noted.  Restaging CT evaluation 01/10/2014 with stable widespread metastatic disease involving the bones; stable 2.8 cm nodular opacity in the anterior right lower lobe.   Disposition: Ms. Fontanilla appears stable. She will continue [name]. Dr. Julien Nordmann recommends the next restaging CT evaluation in approximately one month.   She is currently receiving Xgeva every 4 weeks. Dr. Julien Nordmann recommends changing the schedule to every 8 weeks.  She will return for a follow-up visit 2-3 days after the upcoming CT scan. She will contact the office in the interim with any problems.  New prescriptions were provided for oxycodone and temazepam.  Patient seen with Dr. Julien Nordmann.    Lerlene, Treadwell ANP/GNP-BC   03/25/2014  2:49 PM  ADDENDUM: Hematology/Oncology Attending: I had a face to face encounter with the patient today. I recommended her care plan. This is a very pleasant 54 years old African-American female with metastatic non-small cell lung cancer, adenocarcinoma diagnosed in June 2013 with positive EGFR mutation. She is currently on treatment with [name] 150 mg by mouth daily and tolerating her treatment fairly well. The patient continues to have aching pain in the upper and lower extremities. She is currently on oxycodone for pain management. She was given a refill of her medication today. I would also continue the patient on Xgeva 120 g subcutaneously every  8 weeks for the metastatic bone disease. She would come back for follow-up visit in one month after repeating CT scan of the chest, abdomen and pelvis for restaging of her disease. She was advised to  call immediately if she has any concerning symptoms in the interval.  Disclaimer: This note was dictated with voice recognition software. Similar sounding words can inadvertently be transcribed and may be missed upon review. Eilleen Kempf., MD 03/25/2014

## 2014-03-25 NOTE — Patient Instructions (Signed)
Denosumab injection What is this medicine? DENOSUMAB (den oh sue mab) slows bone breakdown. Prolia is used to treat osteoporosis in women after menopause and in men. Xgeva is used to prevent bone fractures and other bone problems caused by cancer bone metastases. Xgeva is also used to treat giant cell tumor of the bone. This medicine may be used for other purposes; ask your health care provider or pharmacist if you have questions. COMMON BRAND NAME(S): Prolia, XGEVA What should I tell my health care provider before I take this medicine? They need to know if you have any of these conditions: -dental disease -eczema -infection or history of infections -kidney disease or on dialysis -low blood calcium or vitamin D -malabsorption syndrome -scheduled to have surgery or tooth extraction -taking medicine that contains denosumab -thyroid or parathyroid disease -an unusual reaction to denosumab, other medicines, foods, dyes, or preservatives -pregnant or trying to get pregnant -breast-feeding How should I use this medicine? This medicine is for injection under the skin. It is given by a health care professional in a hospital or clinic setting. If you are getting Prolia, a special MedGuide will be given to you by the pharmacist with each prescription and refill. Be sure to read this information carefully each time. For Prolia, talk to your pediatrician regarding the use of this medicine in children. Special care may be needed. For Xgeva, talk to your pediatrician regarding the use of this medicine in children. While this drug may be prescribed for children as young as 13 years for selected conditions, precautions do apply. Overdosage: If you think you've taken too much of this medicine contact a poison control center or emergency room at once. Overdosage: If you think you have taken too much of this medicine contact a poison control center or emergency room at once. NOTE: This medicine is only for  you. Do not share this medicine with others. What if I miss a dose? It is important not to miss your dose. Call your doctor or health care professional if you are unable to keep an appointment. What may interact with this medicine? Do not take this medicine with any of the following medications: -other medicines containing denosumab This medicine may also interact with the following medications: -medicines that suppress the immune system -medicines that treat cancer -steroid medicines like prednisone or cortisone This list may not describe all possible interactions. Give your health care provider a list of all the medicines, herbs, non-prescription drugs, or dietary supplements you use. Also tell them if you smoke, drink alcohol, or use illegal drugs. Some items may interact with your medicine. What should I watch for while using this medicine? Visit your doctor or health care professional for regular checks on your progress. Your doctor or health care professional may order blood tests and other tests to see how you are doing. Call your doctor or health care professional if you get a cold or other infection while receiving this medicine. Do not treat yourself. This medicine may decrease your body's ability to fight infection. You should make sure you get enough calcium and vitamin D while you are taking this medicine, unless your doctor tells you not to. Discuss the foods you eat and the vitamins you take with your health care professional. See your dentist regularly. Brush and floss your teeth as directed. Before you have any dental work done, tell your dentist you are receiving this medicine. Do not become pregnant while taking this medicine or for 5 months after stopping   it. Women should inform their doctor if they wish to become pregnant or think they might be pregnant. There is a potential for serious side effects to an unborn child. Talk to your health care professional or pharmacist for more  information. What side effects may I notice from receiving this medicine? Side effects that you should report to your doctor or health care professional as soon as possible: -allergic reactions like skin rash, itching or hives, swelling of the face, lips, or tongue -breathing problems -chest pain -fast, irregular heartbeat -feeling faint or lightheaded, falls -fever, chills, or any other sign of infection -muscle spasms, tightening, or twitches -numbness or tingling -skin blisters or bumps, or is dry, peels, or red -slow healing or unexplained pain in the mouth or jaw -unusual bleeding or bruising Side effects that usually do not require medical attention (Report these to your doctor or health care professional if they continue or are bothersome.): -muscle pain -stomach upset, gas This list may not describe all possible side effects. Call your doctor for medical advice about side effects. You may report side effects to FDA at 1-800-FDA-1088. Where should I keep my medicine? This medicine is only given in a clinic, doctor's office, or other health care setting and will not be stored at home. NOTE: This sheet is a summary. It may not cover all possible information. If you have questions about this medicine, talk to your doctor, pharmacist, or health care provider.  2015, Elsevier/Gold Standard. (2011-09-06 12:37:47)  

## 2014-03-27 ENCOUNTER — Telehealth: Payer: Self-pay | Admitting: Internal Medicine

## 2014-03-27 NOTE — Telephone Encounter (Signed)
lvm for pt regarding to Feb appts...Marland KitchenMarland Kitchen

## 2014-03-31 ENCOUNTER — Other Ambulatory Visit: Payer: Self-pay | Admitting: Internal Medicine

## 2014-04-15 ENCOUNTER — Other Ambulatory Visit: Payer: Self-pay

## 2014-04-15 MED ORDER — ERLOTINIB HCL 150 MG PO TABS: ORAL_TABLET | ORAL | Status: DC

## 2014-04-18 ENCOUNTER — Other Ambulatory Visit: Payer: Self-pay | Admitting: Medical Oncology

## 2014-04-18 DIAGNOSIS — C3410 Malignant neoplasm of upper lobe, unspecified bronchus or lung: Secondary | ICD-10-CM

## 2014-04-18 DIAGNOSIS — C3491 Malignant neoplasm of unspecified part of right bronchus or lung: Secondary | ICD-10-CM

## 2014-04-18 MED ORDER — OXYCODONE HCL 10 MG PO TABS: 10.0000 mg | ORAL_TABLET | Freq: Four times a day (QID) | ORAL | Status: DC | PRN

## 2014-04-19 ENCOUNTER — Telehealth: Payer: Self-pay | Admitting: Internal Medicine

## 2014-04-19 NOTE — Telephone Encounter (Signed)
pt called to r/s appts...done....pt ok and aware of new d.t

## 2014-04-22 ENCOUNTER — Ambulatory Visit (HOSPITAL_COMMUNITY): Payer: Medicare Other

## 2014-04-22 ENCOUNTER — Other Ambulatory Visit: Payer: Medicare Other

## 2014-04-24 ENCOUNTER — Other Ambulatory Visit: Payer: Self-pay

## 2014-04-24 ENCOUNTER — Ambulatory Visit: Payer: Medicare Other

## 2014-04-24 ENCOUNTER — Ambulatory Visit: Payer: Medicare Other | Admitting: Internal Medicine

## 2014-04-24 DIAGNOSIS — C3490 Malignant neoplasm of unspecified part of unspecified bronchus or lung: Secondary | ICD-10-CM

## 2014-04-24 MED ORDER — IPRATROPIUM-ALBUTEROL 20-100 MCG/ACT IN AERS: 1.0000 | INHALATION_SPRAY | Freq: Four times a day (QID) | RESPIRATORY_TRACT | Status: DC | PRN

## 2014-05-03 ENCOUNTER — Telehealth: Payer: Self-pay | Admitting: Internal Medicine

## 2014-05-03 NOTE — Telephone Encounter (Signed)
pt called to r/s appt..done...pt aware of new d.t °

## 2014-05-08 ENCOUNTER — Other Ambulatory Visit: Payer: Medicare Other

## 2014-05-08 ENCOUNTER — Ambulatory Visit: Payer: Medicare Other

## 2014-05-08 ENCOUNTER — Ambulatory Visit (HOSPITAL_COMMUNITY): Payer: Medicare Other

## 2014-05-09 ENCOUNTER — Telehealth: Payer: Self-pay | Admitting: *Deleted

## 2014-05-09 ENCOUNTER — Telehealth: Payer: Self-pay | Admitting: Internal Medicine

## 2014-05-09 ENCOUNTER — Encounter: Payer: Self-pay | Admitting: Physician Assistant

## 2014-05-09 ENCOUNTER — Other Ambulatory Visit (HOSPITAL_BASED_OUTPATIENT_CLINIC_OR_DEPARTMENT_OTHER): Payer: Medicare Other

## 2014-05-09 ENCOUNTER — Ambulatory Visit: Payer: Medicare Other

## 2014-05-09 ENCOUNTER — Ambulatory Visit (HOSPITAL_BASED_OUTPATIENT_CLINIC_OR_DEPARTMENT_OTHER): Payer: Medicare Other | Admitting: Physician Assistant

## 2014-05-09 VITALS — BP 138/93 | HR 98 | Temp 98.8°F | Resp 19 | Ht 67.0 in | Wt 259.6 lb

## 2014-05-09 DIAGNOSIS — C3410 Malignant neoplasm of upper lobe, unspecified bronchus or lung: Secondary | ICD-10-CM

## 2014-05-09 DIAGNOSIS — G4700 Insomnia, unspecified: Secondary | ICD-10-CM

## 2014-05-09 DIAGNOSIS — C7951 Secondary malignant neoplasm of bone: Secondary | ICD-10-CM

## 2014-05-09 DIAGNOSIS — C3411 Malignant neoplasm of upper lobe, right bronchus or lung: Secondary | ICD-10-CM | POA: Diagnosis not present

## 2014-05-09 DIAGNOSIS — C3491 Malignant neoplasm of unspecified part of right bronchus or lung: Secondary | ICD-10-CM

## 2014-05-09 LAB — CBC WITH DIFFERENTIAL/PLATELET
BASO%: 0.1 % (ref 0.0–2.0)
Basophils Absolute: 0 10*3/uL (ref 0.0–0.1)
EOS%: 2.1 % (ref 0.0–7.0)
Eosinophils Absolute: 0.2 10*3/uL (ref 0.0–0.5)
HEMATOCRIT: 37.5 % (ref 34.8–46.6)
HEMOGLOBIN: 11.9 g/dL (ref 11.6–15.9)
LYMPH%: 34.3 % (ref 14.0–49.7)
MCH: 31.2 pg (ref 25.1–34.0)
MCHC: 31.7 g/dL (ref 31.5–36.0)
MCV: 98.2 fL (ref 79.5–101.0)
MONO#: 0.5 10*3/uL (ref 0.1–0.9)
MONO%: 7 % (ref 0.0–14.0)
NEUT%: 56.5 % (ref 38.4–76.8)
NEUTROS ABS: 4.2 10*3/uL (ref 1.5–6.5)
Platelets: 272 10*3/uL (ref 145–400)
RBC: 3.82 10*6/uL (ref 3.70–5.45)
RDW: 14.7 % — ABNORMAL HIGH (ref 11.2–14.5)
WBC: 7.5 10*3/uL (ref 3.9–10.3)
lymph#: 2.6 10*3/uL (ref 0.9–3.3)

## 2014-05-09 LAB — COMPREHENSIVE METABOLIC PANEL (CC13)
ALBUMIN: 3.6 g/dL (ref 3.5–5.0)
ALK PHOS: 94 U/L (ref 40–150)
ALT: 15 U/L (ref 0–55)
ANION GAP: 9 meq/L (ref 3–11)
AST: 22 U/L (ref 5–34)
BUN: 16.1 mg/dL (ref 7.0–26.0)
CHLORIDE: 109 meq/L (ref 98–109)
CO2: 27 mEq/L (ref 22–29)
Calcium: 9.4 mg/dL (ref 8.4–10.4)
Creatinine: 0.8 mg/dL (ref 0.6–1.1)
EGFR: >90 mL/min/{1.73_m2} (ref >90)
Glucose: 91 mg/dl (ref 70–140)
POTASSIUM: 4.5 meq/L (ref 3.5–5.1)
Sodium: 145 mEq/L (ref 136–145)
TOTAL PROTEIN: 7.2 g/dL (ref 6.4–8.3)
Total Bilirubin: 0.41 mg/dL (ref 0.20–1.20)

## 2014-05-09 MED ORDER — TEMAZEPAM 15 MG PO CAPS: 15.0000 mg | ORAL_CAPSULE | Freq: Every evening | ORAL | Status: DC | PRN

## 2014-05-09 MED ORDER — OXYCODONE HCL 10 MG PO TABS: 10.0000 mg | ORAL_TABLET | Freq: Four times a day (QID) | ORAL | Status: DC | PRN

## 2014-05-09 NOTE — Progress Notes (Addendum)
[facility] [phone]   [phone]  OFFICE PROGRESS NOTE  [name], MD [place] [address]  DIAGNOSIS AND STAGE: Metastatic non-small cell lung cancer, adenocarcinoma with positive EGFR mutation in exon 19 diagnosed in June of 2013   PRIOR THERAPY: palliative radiotherapy to the right lung under the care of Dr. Valere Dross expected to be completed on 10/29/2011.   CURRENT THERAPY:  1. [name] 150 mg by mouth daily started 10/11/2011. Status post 28 months of therapy 2. Xgeva 120 mg subcutaneously every 8 weeks.  CHEMOTHERAPY INTENT: Palliative  CURRENT # OF CHEMOTHERAPY CYCLES: 29 CURRENT ANTIEMETICS: Compazine  CURRENT SMOKING STATUS: Former smoker but quit.  ORAL CHEMOTHERAPY AND CONSENT: Oral [name]  CURRENT BISPHOSPHONATES USE: Xgeva  PAIN MANAGEMENT: well-controlled with oxycodone when necessary.  NARCOTICS INDUCED CONSTIPATION: None  LIVING WILL AND CODE STATUS: Full code.   INTERVAL HISTORY: Beth [name] 54 y.o. female returns to the clinic today for followup visit. The patient is feeling fine today was no specific complaints except for occasional pain in her lower extremities. She denied having any significant chest pain, shortness breath, cough or hemoptysis. She denied having any significant skin rash and only has few episodes of diarrhea every now and then. The patient denied having any significant weight loss or night sweats. She is tolerating her treatment with [name] fairly well. She is seen today to have repeat lab work for evaluation of her treatment. She requests a refill for oxycodone. She continues to have difficulty sleeping. She states that she is not taking the Belsomra. She prefers the temazepam to help her sleep and requests a refill for that medication as well. She did not get her restaging CT scan done prior to this visit as well as scheduled.  MEDICAL HISTORY: Past Medical History    Diagnosis Date  . Asthma   . GERD (gastroesophageal reflux disease)   . Hypertension   . Heart murmur   . Fibromyalgia   . Arthritis of knee, degenerative   . Endocarditis     as teenager  . Complication of anesthesia     difficulty waking up  . Shortness of breath   . Lung mass     R- ADENOCARCINOMA  . Sleep apnea     no longer using CPAP  . Pleural effusion 08/30/11  . Anxiety   . Depression   . History of radiation therapy 09/29/11-11/04/2011    right lung 2700cGy 15 sessions  . Osseous metastasis 09/20/11    per PET scan    ALLERGIES:  is allergic to meloxicam.  MEDICATIONS:  Current Outpatient Prescriptions  Medication Sig Dispense Refill  . BELSOMRA 10 MG TABS   0  . cloNIDine (CATAPRES) 0.1 MG tablet Take 1 tablet by mouth daily as needed for diastolic blood pressure over 100 10 tablet 0  . diphenhydrAMINE (BENADRYL) 25 mg capsule One to two tabs every 6 hours prn allergies 120 capsule 3  . erlotinib ([name]) 150 MG tablet TAKE 1 TABLET (150 MG) DAILY. TAKE ON AN EMPTY STOMACH I HOUR BEFORE OR 2 HOURS AFTER MEALS 30 tablet 1  . Fe Fum-FePoly-Vit C-Vit B3 (INTEGRA) 62.5-62.5-40-3 MG CAPS Take 1 tablet by mouth daily. 30 capsule 3  . fish oil-omega-3 fatty acids 1000 MG capsule Take 2 g by mouth daily.    Marland Kitchen guaiFENesin-codeine (ROBITUSSIN AC) 100-10 MG/5ML syrup Take 5 mLs by mouth 3 (three) times daily as needed for cough. 120 mL  0  . hydrochlorothiazide (HYDRODIURIL) 25 MG tablet TAKE 1 TABLET BY MOUTH DAILY 30 tablet 5  . Ipratropium-Albuterol (COMBIVENT) 20-100 MCG/ACT AERS respimat Inhale 1 puff into the lungs every 6 (six) hours as needed for wheezing. 1 Inhaler 2  . loperamide (IMODIUM) 2 MG capsule Take 2 mg by mouth 4 (four) times daily as needed for diarrhea or loose stools.    . Multiple Vitamins-Minerals (MULTIVITAMIN WITH MINERALS) tablet Take 1 tablet by mouth daily.    . Oxycodone HCl 10 MG TABS Take 1 tablet (10 mg total) by mouth every 6 (six) hours as  needed. 90 tablet 0  . prochlorperazine (COMPAZINE) 10 MG tablet Take 1 tablet (10 mg total) by mouth every 6 (six) hours as needed. 30 tablet 1  . temazepam (RESTORIL) 15 MG capsule TAKE ONE CAPSULE AT BEDTIME AS NEEDED 30 capsule 0  . temazepam (RESTORIL) 15 MG capsule Take 1 capsule (15 mg total) by mouth at bedtime as needed for sleep. 30 capsule 0  . venlafaxine XR (EFFEXOR-XR) 150 MG 24 hr capsule TAKE ONE CAPSULE BY MOUTH EVERY DAY 30 capsule 5   No current facility-administered medications for this visit.    SURGICAL HISTORY:  Past Surgical History  Procedure Laterality Date  . Tubal ligation  2002  . Thoracentesis  09/02/11, 09/09/11    right-side pleural effusion    REVIEW OF SYSTEMS:  Constitutional: negative Eyes: negative Ears, nose, mouth, throat, and face: negative Respiratory: positive for dyspnea on exertion Cardiovascular: negative Gastrointestinal: negative Genitourinary:negative Integument/breast: negative Hematologic/lymphatic: negative Musculoskeletal:negative Neurological: negative Behavioral/Psych: positive for sleep disturbance Endocrine: negative Allergic/Immunologic: negative   PHYSICAL EXAMINATION: General appearance: alert, cooperative and no distress Head: Normocephalic, without obvious abnormality, atraumatic Neck: no adenopathy, no JVD, supple, symmetrical, trachea midline and thyroid not enlarged, symmetric, no tenderness/mass/nodules Lymph nodes: Cervical, supraclavicular, and axillary nodes normal. Resp: diminished breath sounds RLL and dullness to percussion RLL Back: symmetric, no curvature. ROM normal. No CVA tenderness. Cardio: regular rate and rhythm, S1, S2 normal, no murmur, click, rub or gallop GI: soft, non-tender; bowel sounds normal; no masses,  no organomegaly Extremities: extremities normal, atraumatic, no cyanosis or edema. There is darkening of the skin and swelling of the right fourth toe. Neurologic: Alert and oriented X 3,  normal strength and tone. Normal symmetric reflexes. Normal coordination and gait  ECOG PERFORMANCE STATUS: 1 - Symptomatic but completely ambulatory  Blood pressure 138/93, pulse 98, temperature 98.8 F (37.1 C), temperature source Oral, resp. rate 19, height '5\' 7"'  (1.702 m), weight 259 lb 9.6 oz (117.754 kg), SpO2 97 %.  LABORATORY DATA: Lab Results  Component Value Date   WBC 7.5 05/09/2014   HGB 11.9 05/09/2014   HCT 37.5 05/09/2014   MCV 98.2 05/09/2014   PLT 272 05/09/2014      Chemistry      Component Value Date/Time   NA 145 05/09/2014 1115   NA 139 05/23/2012 1305   K 4.5 05/09/2014 1115   K 4.0 05/23/2012 1305   CL 104 08/18/2012 1154   CL 100 05/23/2012 1305   CO2 27 05/09/2014 1115   CO2 30 05/23/2012 1305   BUN 16.1 05/09/2014 1115   BUN 16 05/23/2012 1305   CREATININE 0.8 05/09/2014 1115   CREATININE 0.74 05/23/2012 1305      Component Value Date/Time   CALCIUM 9.4 05/09/2014 1115   CALCIUM 9.0 05/23/2012 1305   ALKPHOS 94 05/09/2014 1115   ALKPHOS 113 05/23/2012 1305   AST 22  05/09/2014 1115   AST 15 05/23/2012 1305   ALT 15 05/09/2014 1115   ALT 7 05/23/2012 1305   BILITOT 0.41 05/09/2014 1115   BILITOT 0.6 05/23/2012 1305       RADIOGRAPHIC STUDIES:  ASSESSMENT AND PLAN: This is a very pleasant 54 years old [place] American female with:  1)  metastatic non-small cell lung cancer, adenocarcinoma with positive EGFR mutation in exon 19 currently undergoing systemic treatment with oral [name] status post 28 months of treatment. The patient continues to tolerate her treatment fairly well with no significant adverse effect except for mild diarrhea. Patient was discussed with and also seen by Dr. Julien Nordmann. She will continue on [name] 150 mg by mouth daily. She will follow-up in one month with a restaging CT scan of her chest, abdomen and pelvis with contrast to reevaluate her disease.  2) metastatic bone disease:  the patient will continue on Xgeva 120  mg subcutaneously every 8 weeks. She was given a refill of her pain medication.  3) insomnia-patient was given a refill prescription for Restoril 15 mg, one tablet at bedtime as needed for insomnia total of 30 with no refill.  The patient voices understanding of current disease status and treatment options and is in agreement with the current care plan.  All questions were answered. The patient knows to call the clinic with any problems, questions or concerns. We can certainly see the patient much sooner if necessary.  Carlton Adam, PA-C 05/09/2014  ADDENDUM: Hematology/Oncology Attending: I had a face to face encounter with the patient. I recommended her care plan. This is a very pleasant 54 years old African-American female with metastatic non-small cell lung cancer, adenocarcinoma with positive EGFR mutation. She is currently undergoing treatment with [name] 150 mg by mouth daily status post 28 months of treatment and tolerating her treatment fairly well with no significant adverse effects. I recommended for the patient to continue her current treatment with [name]. She will come back for follow-up visit in one month after repeating CT scan of the chest, abdomen and pelvis for restaging of her disease. For the metastatic bone disease the patient will continue on Xgeva and we will change the frequency to every 8 weeks. She was given a refill of her insomnia medicine. The patient was advised to call immediately if she has any concerning symptoms in the interval.  Disclaimer: This note was dictated with voice recognition software. Similar sounding words can inadvertently be transcribed and may be missed upon review. Eilleen Kempf., MD 05/18/2014

## 2014-05-09 NOTE — Telephone Encounter (Signed)
Faxed refill request received for temazepam yesterday afternoon.  Patient scheduled for F/U today.  Request to APP for review.

## 2014-05-09 NOTE — Telephone Encounter (Signed)
Lft msg for pt confirming labs/inj/ov per 02/18 POF, mailed sch to pt also pt did p/u barium before she left.... KJ

## 2014-05-10 ENCOUNTER — Telehealth: Payer: Self-pay | Admitting: Family Medicine

## 2014-05-10 MED ORDER — VENLAFAXINE HCL ER 150 MG PO CP24: ORAL_CAPSULE | ORAL | Status: DC

## 2014-05-10 NOTE — Telephone Encounter (Signed)
[facility] on [address] is requesting re-fill on      venlafaxine XR (EFFEXOR-XR) 150 MG 24 hr capsule       Pharmacy will let the patient know 30 with no re-fills and will let the patient know she needs to make an office visit for future re-fills.

## 2014-05-10 NOTE — Telephone Encounter (Signed)
Rx sent to pharmacy   

## 2014-05-16 NOTE — Patient Instructions (Signed)
Continue [name] 150 mg by mouth daily Follow up in 1 month with a restaging CT scan of your chest, abdomen and pelvis to re-evaluate your disease

## 2014-05-20 ENCOUNTER — Ambulatory Visit: Payer: Medicare Other

## 2014-05-20 ENCOUNTER — Telehealth: Payer: Self-pay | Admitting: *Deleted

## 2014-05-20 NOTE — Telephone Encounter (Signed)
Called patient about missed appointment.  Left message for her to call  Back and reschedule.

## 2014-05-21 ENCOUNTER — Telehealth: Payer: Self-pay | Admitting: *Deleted

## 2014-05-21 ENCOUNTER — Telehealth: Payer: Self-pay | Admitting: Internal Medicine

## 2014-05-21 NOTE — Telephone Encounter (Signed)
Call received requesting injection nurse.  "I'm returning her call.  I need to reschedule injection."  Available times given for Today.  "No It won't be today.  Maybe Thursday at 9:00 am or 10:00 am.  I do not want to be on the phone this long.  This is my third time calling.  I will call back."  Call ended.  This nurse called injection nurse notifying her of this call.

## 2014-05-21 NOTE — Telephone Encounter (Signed)
returned call no vm

## 2014-05-22 ENCOUNTER — Encounter: Payer: Self-pay | Admitting: Internal Medicine

## 2014-05-22 DIAGNOSIS — I10 Essential (primary) hypertension: Secondary | ICD-10-CM | POA: Diagnosis not present

## 2014-05-22 DIAGNOSIS — Z9109 Other allergy status, other than to drugs and biological substances: Secondary | ICD-10-CM | POA: Diagnosis not present

## 2014-05-22 DIAGNOSIS — G893 Neoplasm related pain (acute) (chronic): Secondary | ICD-10-CM | POA: Diagnosis not present

## 2014-05-22 DIAGNOSIS — R87619 Unspecified abnormal cytological findings in specimens from cervix uteri: Secondary | ICD-10-CM | POA: Diagnosis not present

## 2014-05-22 DIAGNOSIS — F319 Bipolar disorder, unspecified: Secondary | ICD-10-CM | POA: Diagnosis not present

## 2014-05-22 NOTE — Progress Notes (Signed)
Recd approval from [employer] for tarvceva 05/21/14-05/21/15 under medicare part d 150 tabs used as directed   Ref#[id number]. Sending to medical records

## 2014-05-23 ENCOUNTER — Telehealth: Payer: Self-pay | Admitting: *Deleted

## 2014-05-23 ENCOUNTER — Ambulatory Visit (HOSPITAL_BASED_OUTPATIENT_CLINIC_OR_DEPARTMENT_OTHER): Payer: Medicare Other

## 2014-05-23 ENCOUNTER — Encounter: Payer: Self-pay | Admitting: Medical Oncology

## 2014-05-23 DIAGNOSIS — C3411 Malignant neoplasm of upper lobe, right bronchus or lung: Secondary | ICD-10-CM | POA: Diagnosis not present

## 2014-05-23 DIAGNOSIS — C3490 Malignant neoplasm of unspecified part of unspecified bronchus or lung: Secondary | ICD-10-CM

## 2014-05-23 DIAGNOSIS — C7951 Secondary malignant neoplasm of bone: Secondary | ICD-10-CM

## 2014-05-23 MED ORDER — DENOSUMAB 120 MG/1.7ML ~~LOC~~ SOLN
120.0000 mg | Freq: Once | SUBCUTANEOUS | Status: AC
  Administered 2014-05-23: 120 mg via SUBCUTANEOUS
  Filled 2014-05-23: qty 1.7

## 2014-05-23 NOTE — Telephone Encounter (Signed)
Patient in the [facility] and requesting a copy of a letter that is in [place].  Advised her to ask her nurse to copy that letter for her.

## 2014-05-23 NOTE — Patient Instructions (Signed)
Denosumab injection What is this medicine? DENOSUMAB (den oh sue mab) slows bone breakdown. Prolia is used to treat osteoporosis in women after menopause and in men. Xgeva is used to prevent bone fractures and other bone problems caused by cancer bone metastases. Xgeva is also used to treat giant cell tumor of the bone. This medicine may be used for other purposes; ask your health care provider or pharmacist if you have questions. COMMON BRAND NAME(S): Prolia, XGEVA What should I tell my health care provider before I take this medicine? They need to know if you have any of these conditions: -dental disease -eczema -infection or history of infections -kidney disease or on dialysis -low blood calcium or vitamin D -malabsorption syndrome -scheduled to have surgery or tooth extraction -taking medicine that contains denosumab -thyroid or parathyroid disease -an unusual reaction to denosumab, other medicines, foods, dyes, or preservatives -pregnant or trying to get pregnant -breast-feeding How should I use this medicine? This medicine is for injection under the skin. It is given by a health care professional in a hospital or clinic setting. If you are getting Prolia, a special MedGuide will be given to you by the pharmacist with each prescription and refill. Be sure to read this information carefully each time. For Prolia, talk to your pediatrician regarding the use of this medicine in children. Special care may be needed. For Xgeva, talk to your pediatrician regarding the use of this medicine in children. While this drug may be prescribed for children as young as 13 years for selected conditions, precautions do apply. Overdosage: If you think you've taken too much of this medicine contact a poison control center or emergency room at once. Overdosage: If you think you have taken too much of this medicine contact a poison control center or emergency room at once. NOTE: This medicine is only for  you. Do not share this medicine with others. What if I miss a dose? It is important not to miss your dose. Call your doctor or health care professional if you are unable to keep an appointment. What may interact with this medicine? Do not take this medicine with any of the following medications: -other medicines containing denosumab This medicine may also interact with the following medications: -medicines that suppress the immune system -medicines that treat cancer -steroid medicines like prednisone or cortisone This list may not describe all possible interactions. Give your health care provider a list of all the medicines, herbs, non-prescription drugs, or dietary supplements you use. Also tell them if you smoke, drink alcohol, or use illegal drugs. Some items may interact with your medicine. What should I watch for while using this medicine? Visit your doctor or health care professional for regular checks on your progress. Your doctor or health care professional may order blood tests and other tests to see how you are doing. Call your doctor or health care professional if you get a cold or other infection while receiving this medicine. Do not treat yourself. This medicine may decrease your body's ability to fight infection. You should make sure you get enough calcium and vitamin D while you are taking this medicine, unless your doctor tells you not to. Discuss the foods you eat and the vitamins you take with your health care professional. See your dentist regularly. Brush and floss your teeth as directed. Before you have any dental work done, tell your dentist you are receiving this medicine. Do not become pregnant while taking this medicine or for 5 months after stopping   it. Women should inform their doctor if they wish to become pregnant or think they might be pregnant. There is a potential for serious side effects to an unborn child. Talk to your health care professional or pharmacist for more  information. What side effects may I notice from receiving this medicine? Side effects that you should report to your doctor or health care professional as soon as possible: -allergic reactions like skin rash, itching or hives, swelling of the face, lips, or tongue -breathing problems -chest pain -fast, irregular heartbeat -feeling faint or lightheaded, falls -fever, chills, or any other sign of infection -muscle spasms, tightening, or twitches -numbness or tingling -skin blisters or bumps, or is dry, peels, or red -slow healing or unexplained pain in the mouth or jaw -unusual bleeding or bruising Side effects that usually do not require medical attention (Report these to your doctor or health care professional if they continue or are bothersome.): -muscle pain -stomach upset, gas This list may not describe all possible side effects. Call your doctor for medical advice about side effects. You may report side effects to FDA at 1-800-FDA-1088. Where should I keep my medicine? This medicine is only given in a clinic, doctor's office, or other health care setting and will not be stored at home. NOTE: This sheet is a summary. It may not cover all possible information. If you have questions about this medicine, talk to your doctor, pharmacist, or health care provider.  2015, Elsevier/Gold Standard. (2011-09-06 12:37:47)  

## 2014-05-24 ENCOUNTER — Telehealth: Payer: Self-pay | Admitting: Internal Medicine

## 2014-05-24 ENCOUNTER — Telehealth: Payer: Self-pay | Admitting: *Deleted

## 2014-05-24 NOTE — Telephone Encounter (Signed)
returned pt call and r/s lab/md....pt ok and aware of d.t

## 2014-05-24 NOTE — Telephone Encounter (Signed)
Patient called upset because she is trying to get her CT scans scheduled and was told that they do not know what kind of scans she needs.  Called [facility] and spoke with Nicole Kindred.  She can she the ordered studies and will call the patient to set those up.

## 2014-05-28 ENCOUNTER — Encounter: Payer: Self-pay | Admitting: Internal Medicine

## 2014-05-28 ENCOUNTER — Encounter: Payer: Self-pay | Admitting: *Deleted

## 2014-05-28 NOTE — Progress Notes (Signed)
Left message for the patient to have her disablility forms faxed to me. We should have recd from [employer]??? I left my ph# and fax#

## 2014-05-29 ENCOUNTER — Encounter: Payer: Self-pay | Admitting: Internal Medicine

## 2014-05-29 NOTE — Progress Notes (Signed)
I left a message for Beth [name] at [phone] ext [phone]. We received the forms but not all came thru and it was in small font. I advised her to fax again.

## 2014-05-29 NOTE — Progress Notes (Signed)
I will place disab forms on the desk for Dr. Julien Nordmann to complete.

## 2014-05-30 ENCOUNTER — Telehealth: Payer: Self-pay | Admitting: *Deleted

## 2014-05-30 NOTE — Telephone Encounter (Signed)
-----   Message from Ardeen Garland, RN sent at 05/29/2014  8:06 AM EST ----- After he signs the letter on his desk please mail it to [place]. ----- Message -----    From: Ardeen Garland, RN    Sent: 05/28/2014   1:04 PM      To: Ardeen Garland, RN  Mail another letter

## 2014-05-30 NOTE — Telephone Encounter (Signed)
Letter mailed to pt.  

## 2014-06-03 ENCOUNTER — Other Ambulatory Visit: Payer: Self-pay | Admitting: Medical Oncology

## 2014-06-03 ENCOUNTER — Telehealth: Payer: Self-pay | Admitting: *Deleted

## 2014-06-03 DIAGNOSIS — C3410 Malignant neoplasm of upper lobe, unspecified bronchus or lung: Secondary | ICD-10-CM

## 2014-06-03 DIAGNOSIS — C3491 Malignant neoplasm of unspecified part of right bronchus or lung: Secondary | ICD-10-CM

## 2014-06-03 MED ORDER — OXYCODONE HCL 10 MG PO TABS: 10.0000 mg | ORAL_TABLET | Freq: Four times a day (QID) | ORAL | Status: DC | PRN

## 2014-06-03 NOTE — Telephone Encounter (Signed)
PT. WOULD LIKE TO PICK UP PRESCRIPTION EARLY TOMORROW MORNING. THIS NOTE WAS ROUTED TO DR.MOHAMED AND DIANE BELL,RN.

## 2014-06-03 NOTE — Progress Notes (Signed)
rx locked in injection room.

## 2014-06-04 ENCOUNTER — Encounter: Payer: Self-pay | Admitting: Internal Medicine

## 2014-06-04 ENCOUNTER — Telehealth: Payer: Self-pay | Admitting: *Deleted

## 2014-06-04 NOTE — Progress Notes (Signed)
Pt needs to take disability request to her PCP. Form returned to [place]

## 2014-06-04 NOTE — Telephone Encounter (Signed)
INFORMED PT. THAT PRESCRIPTION IS READY.

## 2014-06-04 NOTE — Progress Notes (Signed)
Per nurse the disabl forms would not be filled out by Dr. Julien Nordmann. I called and left message on vmail for nurse to let Dr. Julien Nordmann to call her back or nurse to explain why it must go thru her pcp and not him. She said he has done before and is treating her cancer. She wants call at home# or [phone]

## 2014-06-05 ENCOUNTER — Telehealth: Payer: Self-pay | Admitting: Family Medicine

## 2014-06-05 ENCOUNTER — Ambulatory Visit: Payer: Medicare Other | Admitting: Internal Medicine

## 2014-06-05 ENCOUNTER — Telehealth: Payer: Self-pay | Admitting: Medical Oncology

## 2014-06-05 ENCOUNTER — Other Ambulatory Visit: Payer: Medicare Other

## 2014-06-05 NOTE — Telephone Encounter (Signed)
FYI: Diane from Dr. Lew Dawes office states, patient tried to give Dr. Earlie Server a long term disability form to complete and they are advising her to contact Dr. Elease Hashimoto to complete.

## 2014-06-05 NOTE — Telephone Encounter (Signed)
I called Dr Anastasio Auerbach office re disability papers and staff person told me to tell t to call his office for appointment.Pt notified . I will ask Raquel to fax disability papers to Dr Elease Hashimoto.Note sent to Raquel.

## 2014-06-06 ENCOUNTER — Ambulatory Visit (HOSPITAL_COMMUNITY): Payer: Medicare Other

## 2014-06-10 ENCOUNTER — Telehealth: Payer: Self-pay

## 2014-06-10 NOTE — Telephone Encounter (Signed)
Patient needs a 30 min office visit. Pt has not been seen in a year to complete forms.

## 2014-06-11 NOTE — Telephone Encounter (Signed)
Pt has been scheduled.  °

## 2014-06-12 ENCOUNTER — Ambulatory Visit: Payer: Medicare Other | Admitting: Family Medicine

## 2014-06-17 ENCOUNTER — Ambulatory Visit: Payer: Medicare Other | Admitting: Family Medicine

## 2014-06-17 ENCOUNTER — Other Ambulatory Visit: Payer: Self-pay | Admitting: Internal Medicine

## 2014-06-17 ENCOUNTER — Other Ambulatory Visit (HOSPITAL_COMMUNITY): Payer: Medicare Other

## 2014-06-18 ENCOUNTER — Ambulatory Visit (HOSPITAL_COMMUNITY)
Admission: RE | Admit: 2014-06-18 | Discharge: 2014-06-18 | Disposition: A | Discharge status: Home / Self Care | Payer: Medicare Other | Source: Ambulatory Visit | Attending: Physician Assistant | Admitting: Physician Assistant

## 2014-06-18 ENCOUNTER — Ambulatory Visit (INDEPENDENT_AMBULATORY_CARE_PROVIDER_SITE_OTHER): Payer: Medicare Other | Admitting: Family Medicine

## 2014-06-18 ENCOUNTER — Encounter: Payer: Self-pay | Admitting: Family Medicine

## 2014-06-18 VITALS — BP 130/80 | HR 98 | Temp 98.0°F | Wt 257.0 lb

## 2014-06-18 DIAGNOSIS — E559 Vitamin D deficiency, unspecified: Secondary | ICD-10-CM | POA: Diagnosis not present

## 2014-06-18 DIAGNOSIS — C3490 Malignant neoplasm of unspecified part of unspecified bronchus or lung: Secondary | ICD-10-CM | POA: Diagnosis not present

## 2014-06-18 DIAGNOSIS — J3089 Other allergic rhinitis: Secondary | ICD-10-CM | POA: Diagnosis not present

## 2014-06-18 DIAGNOSIS — I10 Essential (primary) hypertension: Secondary | ICD-10-CM

## 2014-06-18 DIAGNOSIS — Z87891 Personal history of nicotine dependence: Secondary | ICD-10-CM | POA: Diagnosis not present

## 2014-06-18 DIAGNOSIS — C3491 Malignant neoplasm of unspecified part of right bronchus or lung: Secondary | ICD-10-CM | POA: Insufficient documentation

## 2014-06-18 DIAGNOSIS — C229 Malignant neoplasm of liver, not specified as primary or secondary: Secondary | ICD-10-CM | POA: Diagnosis not present

## 2014-06-18 DIAGNOSIS — C7951 Secondary malignant neoplasm of bone: Secondary | ICD-10-CM | POA: Diagnosis not present

## 2014-06-18 MED ORDER — MOMETASONE FUROATE 50 MCG/ACT NA SUSP: 2.0000 | Freq: Every day | NASAL | Status: DC

## 2014-06-18 MED ORDER — IOHEXOL 300 MG/ML  SOLN
100.0000 mL | Freq: Once | INTRAMUSCULAR | Status: AC | PRN
  Administered 2014-06-18: 100 mL via INTRAVENOUS

## 2014-06-18 MED ORDER — IOHEXOL 300 MG/ML  SOLN: 100.0000 mL | Freq: Once | INTRAMUSCULAR | Status: DC | PRN

## 2014-06-18 MED ORDER — CLONIDINE HCL 0.1 MG PO TABS: ORAL_TABLET | ORAL | Status: DC

## 2014-06-18 NOTE — Progress Notes (Signed)
Subjective:    Patient ID: Beth [name], female    DOB: 02-20-1961, 54 y.o.   MRN: [id number]  HPI Patient has stage IV adenocarcinoma of the lung. She is here to have disability papers completed today. She is followed closely by oncology. She remains on [name]. She has follow-up CT chest as well as abdomen and pelvis later today. Appetite and weight have been stable. Denies any cough. She does have some persistent shortness of breath with minimal exertion. This limits her activities tremendously. She was diagnosed back in January of 2013.  Complaining of postmenopausal hot flashes. She takes Effexor but this has not helped. She has clonidine on her med list but does not recall taking this previously. She's tried over-the-counter herbal treatments such as black cohosh without much improvement  Hypertension treated with HCTZ and stable.  No headaches or recent dizziness. Hx of Vit D deficiency.  Pt requesting repeat levels.  Not on supplement at this time.    Past Medical History  Diagnosis Date  . Asthma   . GERD (gastroesophageal reflux disease)   . Hypertension   . Heart murmur   . Fibromyalgia   . Arthritis of knee, degenerative   . Endocarditis     as teenager  . Complication of anesthesia     difficulty waking up  . Shortness of breath   . Lung mass     R- ADENOCARCINOMA  . Sleep apnea     no longer using CPAP  . Pleural effusion 08/30/11  . Anxiety   . Depression   . History of radiation therapy 09/29/11-11/04/2011    right lung 2700cGy 15 sessions  . Osseous metastasis 09/20/11    per PET scan   Past Surgical History  Procedure Laterality Date  . Tubal ligation  2002  . Thoracentesis  09/02/11, 09/09/11    right-side pleural effusion    reports that she quit smoking about 3 years ago. Her smoking use included Cigarettes. She has a 7.5 pack-year smoking history. She does not have any smokeless tobacco history on file. She reports that she does not drink alcohol or  use illicit drugs. family history includes Alcohol abuse in her father; Hypertension in her maternal aunt, maternal grandfather, maternal grandmother, maternal uncle, and mother; Sarcoidosis in her sister. Allergies  Allergen Reactions  . Meloxicam     Possible GI bleed      Review of Systems  Constitutional: Positive for fatigue. Negative for fever and chills.  Respiratory: Positive for shortness of breath. Negative for cough and wheezing.   Gastrointestinal: Negative for abdominal pain.  Endocrine: Negative for polydipsia and polyuria.  Genitourinary: Negative for dysuria.  Neurological: Positive for headaches. Negative for dizziness, seizures, syncope and weakness.       Objective:   Physical Exam  Constitutional: She appears well-developed and well-nourished.  HENT:  Mouth/Throat: Oropharynx is clear and moist.  Neck: Neck supple. No thyromegaly present.  Cardiovascular: Normal rate and regular rhythm.   Pulmonary/Chest: Effort normal and breath sounds normal. No respiratory distress. She has no wheezes. She has no rales.  Musculoskeletal: She exhibits no edema.  Psychiatric: She has a normal mood and affect. Her behavior is normal.          Assessment & Plan:  #1 adenocarcinoma of the lung with hx of right lung collapse.. Disability papers completed.  She is limited primarily on the basis of dyspnea with activity.   #2 rhinitis. Suspect allergic. Nasonex 2 sprays per nostril once daily #  3 hypertension stable. #4 hx of Vit d deficiency.  Pt requesting repeat levels.  Does not consistently take supplement.

## 2014-06-18 NOTE — Progress Notes (Signed)
Pre visit review using our clinic review tool, if applicable. No additional management support is needed unless otherwise documented below in the visit note. 

## 2014-06-19 ENCOUNTER — Telehealth: Payer: Self-pay | Admitting: Internal Medicine

## 2014-06-19 ENCOUNTER — Other Ambulatory Visit: Payer: Self-pay | Admitting: Medical Oncology

## 2014-06-19 ENCOUNTER — Encounter: Payer: Self-pay | Admitting: Internal Medicine

## 2014-06-19 ENCOUNTER — Other Ambulatory Visit (HOSPITAL_BASED_OUTPATIENT_CLINIC_OR_DEPARTMENT_OTHER): Payer: Medicare Other

## 2014-06-19 ENCOUNTER — Ambulatory Visit (HOSPITAL_BASED_OUTPATIENT_CLINIC_OR_DEPARTMENT_OTHER): Payer: Medicare Other | Admitting: Internal Medicine

## 2014-06-19 ENCOUNTER — Other Ambulatory Visit: Payer: Self-pay | Admitting: Internal Medicine

## 2014-06-19 VITALS — BP 122/85 | HR 99 | Temp 98.6°F | Resp 18 | Ht 67.0 in | Wt 259.7 lb

## 2014-06-19 DIAGNOSIS — C7951 Secondary malignant neoplasm of bone: Secondary | ICD-10-CM

## 2014-06-19 DIAGNOSIS — C3490 Malignant neoplasm of unspecified part of unspecified bronchus or lung: Secondary | ICD-10-CM

## 2014-06-19 DIAGNOSIS — C3411 Malignant neoplasm of upper lobe, right bronchus or lung: Secondary | ICD-10-CM | POA: Diagnosis not present

## 2014-06-19 DIAGNOSIS — C3410 Malignant neoplasm of upper lobe, unspecified bronchus or lung: Secondary | ICD-10-CM

## 2014-06-19 DIAGNOSIS — C3491 Malignant neoplasm of unspecified part of right bronchus or lung: Secondary | ICD-10-CM

## 2014-06-19 LAB — CBC WITH DIFFERENTIAL/PLATELET
BASO%: 0.2 % (ref 0.0–2.0)
BASOS ABS: 0 10*3/uL (ref 0.0–0.1)
EOS%: 2 % (ref 0.0–7.0)
Eosinophils Absolute: 0.1 10*3/uL (ref 0.0–0.5)
HCT: 36.5 % (ref 34.8–46.6)
HGB: 12 g/dL (ref 11.6–15.9)
LYMPH#: 2 10*3/uL (ref 0.9–3.3)
LYMPH%: 30.3 % (ref 14.0–49.7)
MCH: 31.7 pg (ref 25.1–34.0)
MCHC: 32.9 g/dL (ref 31.5–36.0)
MCV: 96.3 fL (ref 79.5–101.0)
MONO#: 0.5 10*3/uL (ref 0.1–0.9)
MONO%: 7.2 % (ref 0.0–14.0)
NEUT#: 3.9 10*3/uL (ref 1.5–6.5)
NEUT%: 60.3 % (ref 38.4–76.8)
PLATELETS: 256 10*3/uL (ref 145–400)
RBC: 3.79 10*6/uL (ref 3.70–5.45)
RDW: 14.3 % (ref 11.2–14.5)
WBC: 6.4 10*3/uL (ref 3.9–10.3)

## 2014-06-19 LAB — COMPREHENSIVE METABOLIC PANEL (CC13)
ALT: 17 U/L (ref 0–55)
ANION GAP: 13 meq/L — AB (ref 3–11)
AST: 18 U/L (ref 5–34)
Albumin: 3.5 g/dL (ref 3.5–5.0)
Alkaline Phosphatase: 87 U/L (ref 40–150)
BUN: 16.7 mg/dL (ref 7.0–26.0)
CO2: 28 mEq/L (ref 22–29)
Calcium: 8.9 mg/dL (ref 8.4–10.4)
Chloride: 102 mEq/L (ref 98–109)
Creatinine: 0.8 mg/dL (ref 0.6–1.1)
EGFR: >90 mL/min/{1.73_m2} (ref >90)
Glucose: 99 mg/dl (ref 70–140)
Potassium: 3.7 mEq/L (ref 3.5–5.1)
SODIUM: 143 meq/L (ref 136–145)
Total Bilirubin: 0.58 mg/dL (ref 0.20–1.20)
Total Protein: 7.2 g/dL (ref 6.4–8.3)

## 2014-06-19 MED ORDER — OXYCODONE HCL 10 MG PO TABS: 10.0000 mg | ORAL_TABLET | Freq: Four times a day (QID) | ORAL | Status: DC | PRN

## 2014-06-19 NOTE — Progress Notes (Signed)
oxycodone rx given to pt.

## 2014-06-19 NOTE — Telephone Encounter (Signed)
s.w. pt and advised on April and May...pt ok and aware

## 2014-06-19 NOTE — Progress Notes (Signed)
[place] [phone]   [phone]  OFFICE PROGRESS NOTE  [name], MD [place] [address]  DIAGNOSIS AND STAGE: Metastatic non-small cell lung cancer, adenocarcinoma with positive EGFR mutation in exon 19 diagnosed in June of 2013   PRIOR THERAPY: palliative radiotherapy to the right lung under the care of Dr. Valere Dross expected to be completed on 10/29/2011.   CURRENT THERAPY:  1. [name] 150 mg by mouth daily started 10/11/2011. Status post 31 months of therapy 2. Xgeva 120 mg subcutaneously every 8 weeks.  CHEMOTHERAPY INTENT: Palliative  CURRENT # OF CHEMOTHERAPY CYCLES: 32 CURRENT ANTIEMETICS: Compazine  CURRENT SMOKING STATUS: Former smoker but quit.  ORAL CHEMOTHERAPY AND CONSENT: Oral [name]  CURRENT BISPHOSPHONATES USE: Xgeva  PAIN MANAGEMENT: well-controlled with oxycodone when necessary.  NARCOTICS INDUCED CONSTIPATION: None  LIVING WILL AND CODE STATUS: Full code.   INTERVAL HISTORY: Beth [name] 54 y.o. female returns to the clinic today for followup visit. The patient is feeling fine today with no specific complaints. She denied having any significant chest pain, shortness of breath, cough or hemoptysis. She denied having any significant skin rash or diarrhea. The patient denied having any significant weight loss or night sweats. She is tolerating her treatment with [name] fairly well. The patient had repeat CT scan of the chest, abdomen and pelvis performed recently and she is here for evaluation and discussion of her scan results.  MEDICAL HISTORY: Past Medical History  Diagnosis Date  . Asthma   . GERD (gastroesophageal reflux disease)   . Hypertension   . Heart murmur   . Fibromyalgia   . Arthritis of knee, degenerative   . Endocarditis     as teenager  . Complication of anesthesia     difficulty waking up  . Shortness of breath   . Lung mass     R- ADENOCARCINOMA  . Sleep  apnea     no longer using CPAP  . Pleural effusion 08/30/11  . Anxiety   . Depression   . History of radiation therapy 09/29/11-11/04/2011    right lung 2700cGy 15 sessions  . Osseous metastasis 09/20/11    per PET scan    ALLERGIES:  is allergic to meloxicam.  MEDICATIONS:  Current Outpatient Prescriptions  Medication Sig Dispense Refill  . cloNIDine (CATAPRES) 0.1 MG tablet Take 1 tablet by mouth daily as needed for diastolic blood pressure over 100 10 tablet 0  . fish oil-omega-3 fatty acids 1000 MG capsule Take 2 g by mouth daily.    Marland Kitchen guaiFENesin-codeine (ROBITUSSIN AC) 100-10 MG/5ML syrup Take 5 mLs by mouth 3 (three) times daily as needed for cough. 120 mL 0  . Ipratropium-Albuterol (COMBIVENT) 20-100 MCG/ACT AERS respimat Inhale 1 puff into the lungs every 6 (six) hours as needed for wheezing. 1 Inhaler 2  . loperamide (IMODIUM) 2 MG capsule Take 2 mg by mouth 4 (four) times daily as needed for diarrhea or loose stools.    . mometasone (NASONEX) 50 MCG/ACT nasal spray Place 2 sprays into the nose daily. 17 g 12  . Multiple Vitamins-Minerals (MULTIVITAMIN WITH MINERALS) tablet Take 1 tablet by mouth daily.    . Oxycodone HCl 10 MG TABS Take 1 tablet (10 mg total) by mouth every 6 (six) hours as needed. 90 tablet 0  . prochlorperazine (COMPAZINE) 10 MG tablet Take 1 tablet (10 mg total) by mouth every 6 (six) hours as needed. 30 tablet 1  . [name]  150 MG tablet Take 1 tablet by mouth  daily on an empty stomach 1 hour before or 2 hours  after a meal 30 tablet 0  . venlafaxine XR (EFFEXOR-XR) 150 MG 24 hr capsule TAKE ONE CAPSULE BY MOUTH EVERY DAY 30 capsule 0  . diphenhydrAMINE (BENADRYL) 25 mg capsule One to two tabs every 6 hours prn allergies (Patient not taking: Reported on 06/19/2014) 120 capsule 3  . hydrochlorothiazide (HYDRODIURIL) 25 MG tablet TAKE 1 TABLET BY MOUTH DAILY (Patient not taking: Reported on 06/19/2014) 30 tablet 5  . temazepam (RESTORIL) 15 MG capsule TAKE ONE  CAPSULE AT BEDTIME AS NEEDED 30 capsule 0   No current facility-administered medications for this visit.    SURGICAL HISTORY:  Past Surgical History  Procedure Laterality Date  . Tubal ligation  2002  . Thoracentesis  09/02/11, 09/09/11    right-side pleural effusion    REVIEW OF SYSTEMS:  Constitutional: negative Eyes: negative Ears, nose, mouth, throat, and face: negative Respiratory: positive for dyspnea on exertion Cardiovascular: negative Gastrointestinal: negative Genitourinary:negative Integument/breast: negative Hematologic/lymphatic: negative Musculoskeletal:negative Neurological: negative Behavioral/Psych: negative Endocrine: negative Allergic/Immunologic: negative   PHYSICAL EXAMINATION: General appearance: alert, cooperative and no distress Head: Normocephalic, without obvious abnormality, atraumatic Neck: no adenopathy, no JVD, supple, symmetrical, trachea midline and thyroid not enlarged, symmetric, no tenderness/mass/nodules Lymph nodes: Cervical, supraclavicular, and axillary nodes normal. Resp: diminished breath sounds RLL and dullness to percussion RLL Back: symmetric, no curvature. ROM normal. No CVA tenderness. Cardio: regular rate and rhythm, S1, S2 normal, no murmur, click, rub or gallop GI: soft, non-tender; bowel sounds normal; no masses,  no organomegaly Extremities: extremities normal, atraumatic, no cyanosis or edema. There is darkening of the skin and swelling of the right fourth toe. Neurologic: Alert and oriented X 3, normal strength and tone. Normal symmetric reflexes. Normal coordination and gait  ECOG PERFORMANCE STATUS: 1 - Symptomatic but completely ambulatory  Blood pressure 122/85, pulse 99, temperature 98.6 F (37 C), temperature source Oral, resp. rate 18, height _0  (1.702 m), weight 259 lb 11.2 oz (117.799 kg), SpO2 100 %.  LABORATORY DATA: Lab Results  Component Value Date   WBC 6.4 06/19/2014   HGB 12.0 06/19/2014   HCT 36.5  06/19/2014   MCV 96.3 06/19/2014   PLT 256 06/19/2014      Chemistry      Component Value Date/Time   NA 145 05/09/2014 1115   NA 139 05/23/2012 1305   K 4.5 05/09/2014 1115   K 4.0 05/23/2012 1305   CL 104 08/18/2012 1154   CL 100 05/23/2012 1305   CO2 27 05/09/2014 1115   CO2 30 05/23/2012 1305   BUN 16.1 05/09/2014 1115   BUN 16 05/23/2012 1305   CREATININE 0.8 05/09/2014 1115   CREATININE 0.74 05/23/2012 1305      Component Value Date/Time   CALCIUM 9.4 05/09/2014 1115   CALCIUM 9.0 05/23/2012 1305   ALKPHOS 94 05/09/2014 1115   ALKPHOS 113 05/23/2012 1305   AST 22 05/09/2014 1115   AST 15 05/23/2012 1305   ALT 15 05/09/2014 1115   ALT 7 05/23/2012 1305   BILITOT 0.41 05/09/2014 1115   BILITOT 0.6 05/23/2012 1305       RADIOGRAPHIC STUDIES:  ASSESSMENT AND PLAN: This is a very pleasant 54 years old [place] American female with:  1)  metastatic non-small cell lung cancer, adenocarcinoma with positive EGFR mutation in exon 19 currently undergoing systemic treatment with oral [name] status post 31 months of  treatment. The patient is tolerating her treatment fairly well with no significant adverse effects. The recent CT scan of the chest, abdomen and pelvis showed no evidence for disease progression. I discussed the scan results with the patient today. I recommended for her to continue on [name] 150 mg by mouth daily. She would come back for follow-up visit in 6 weeks for reevaluation with repeat blood work.  2) metastatic bone disease:  the patient will continue on Xgeva 120 mg subcutaneously every 8 weeks. She was giving a refill of her pain medication. The patient voices understanding of current disease status and treatment options and is in agreement with the current care plan.  All questions were answered. The patient knows to call the clinic with any problems, questions or concerns. We can certainly see the patient much sooner if necessary.  Disclaimer: This  note was dictated with voice recognition software. Similar sounding words can inadvertently be transcribed and may be missed upon review.

## 2014-06-20 ENCOUNTER — Telehealth: Payer: Self-pay | Admitting: Family Medicine

## 2014-06-20 ENCOUNTER — Ambulatory Visit: Payer: Medicare Other | Admitting: Family Medicine

## 2014-06-20 NOTE — Telephone Encounter (Signed)
Pt states that she had B12 lab drawn from Dr. Rozell Searing office. Informed patient that i did not see where they collected B12 lab. Do you know anything about this lab.

## 2014-06-20 NOTE — Telephone Encounter (Signed)
What we discussed (and ordered) was Vit D level and this was apparently not drawn by them.  We will need to draw here if she if still interested.  Her order is already in computer.

## 2014-06-20 NOTE — Telephone Encounter (Signed)
Pt called would like a call back about her b12 results

## 2014-06-21 NOTE — Telephone Encounter (Signed)
Pt informed

## 2014-06-27 ENCOUNTER — Telehealth: Payer: Self-pay | Admitting: Family Medicine

## 2014-06-27 NOTE — Telephone Encounter (Signed)
Pt informed

## 2014-06-27 NOTE — Telephone Encounter (Signed)
Antibiotics not indicated for sore throat unless strep- which is less likely than viral. If concerned, would need to rule out strep

## 2014-06-27 NOTE — Telephone Encounter (Signed)
Pt seen 3/29 and states she had sore throat which is not any better, since pt has lung cancer,  would like to know if dr Beth [name] will send in zpak or any antibiotic that may help this not get worse.   [facility] [address]

## 2014-07-16 ENCOUNTER — Telehealth: Payer: Self-pay | Admitting: Internal Medicine

## 2014-07-16 ENCOUNTER — Other Ambulatory Visit: Payer: Self-pay | Admitting: *Deleted

## 2014-07-16 DIAGNOSIS — C3491 Malignant neoplasm of unspecified part of right bronchus or lung: Secondary | ICD-10-CM

## 2014-07-16 DIAGNOSIS — C3410 Malignant neoplasm of upper lobe, unspecified bronchus or lung: Secondary | ICD-10-CM

## 2014-07-16 MED ORDER — OXYCODONE HCL 10 MG PO TABS: 10.0000 mg | ORAL_TABLET | Freq: Four times a day (QID) | ORAL | Status: DC | PRN

## 2014-07-16 NOTE — Telephone Encounter (Signed)
Pt notified to pick up rx -she will pick them up tomorrow and get her injection

## 2014-07-16 NOTE — Telephone Encounter (Signed)
TC from requesting refill on her oxycodone 10 mg . Last filled 06/19/14.   Patient would like to pick up script today. Please call her @ [phone] for pick up time.

## 2014-07-16 NOTE — Addendum Note (Signed)
Addended by: Ardeen Garland on: 07/16/2014 03:59 PM   Modules accepted: Orders

## 2014-07-16 NOTE — Addendum Note (Signed)
Addended by: Ardeen Garland on: 07/16/2014 04:11 PM   Modules accepted: Orders

## 2014-07-16 NOTE — Telephone Encounter (Signed)
moved appt epr pof....per pof pt aware

## 2014-07-16 NOTE — Telephone Encounter (Signed)
Adrena out of office -reprinted rx for [facility] to sign.

## 2014-07-17 ENCOUNTER — Ambulatory Visit (HOSPITAL_BASED_OUTPATIENT_CLINIC_OR_DEPARTMENT_OTHER): Payer: Medicare Other

## 2014-07-17 VITALS — BP 127/86 | HR 102 | Temp 99.2°F

## 2014-07-17 DIAGNOSIS — C3490 Malignant neoplasm of unspecified part of unspecified bronchus or lung: Secondary | ICD-10-CM | POA: Diagnosis not present

## 2014-07-17 DIAGNOSIS — C7951 Secondary malignant neoplasm of bone: Secondary | ICD-10-CM | POA: Diagnosis present

## 2014-07-17 MED ORDER — DENOSUMAB 120 MG/1.7ML ~~LOC~~ SOLN
120.0000 mg | Freq: Once | SUBCUTANEOUS | Status: AC
  Administered 2014-07-17: 120 mg via SUBCUTANEOUS
  Filled 2014-07-17: qty 1.7

## 2014-07-18 ENCOUNTER — Ambulatory Visit: Payer: Medicare Other

## 2014-07-20 ENCOUNTER — Emergency Department (INDEPENDENT_AMBULATORY_CARE_PROVIDER_SITE_OTHER)
Admission: EM | Admit: 2014-07-20 | Discharge: 2014-07-20 | Disposition: A | Discharge status: Home / Self Care | Payer: Medicare Other | Source: Home / Self Care | Attending: Family Medicine | Admitting: Family Medicine

## 2014-07-20 ENCOUNTER — Encounter (HOSPITAL_COMMUNITY): Payer: Self-pay | Admitting: Emergency Medicine

## 2014-07-20 DIAGNOSIS — G8929 Other chronic pain: Secondary | ICD-10-CM | POA: Diagnosis not present

## 2014-07-20 MED ORDER — OXYCODONE HCL 10 MG PO TABS
10.0000 mg | ORAL_TABLET | Freq: Three times a day (TID) | ORAL | Status: DC | PRN
Start: 2014-07-20 — End: 2014-09-11
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
## 2014-07-20 NOTE — Discharge Instructions (Signed)

## 2014-07-22 ENCOUNTER — Other Ambulatory Visit: Payer: Self-pay | Admitting: Internal Medicine

## 2014-07-22 ENCOUNTER — Telehealth: Payer: Self-pay | Admitting: Medical Oncology

## 2014-07-22 ENCOUNTER — Other Ambulatory Visit: Payer: Self-pay | Admitting: Medical Oncology

## 2014-07-22 DIAGNOSIS — C3490 Malignant neoplasm of unspecified part of unspecified bronchus or lung: Secondary | ICD-10-CM

## 2014-07-22 MED ORDER — OXYCODONE HCL 10 MG PO TABS: 10.0000 mg | ORAL_TABLET | Freq: Four times a day (QID) | ORAL | Status: DC | PRN
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
## 2014-07-23 ENCOUNTER — Other Ambulatory Visit: Payer: Self-pay | Admitting: Internal Medicine

## 2014-07-31 ENCOUNTER — Ambulatory Visit (HOSPITAL_BASED_OUTPATIENT_CLINIC_OR_DEPARTMENT_OTHER): Payer: Medicare Other | Admitting: Physician Assistant

## 2014-07-31 ENCOUNTER — Encounter: Payer: Self-pay | Admitting: Physician Assistant

## 2014-07-31 ENCOUNTER — Other Ambulatory Visit (HOSPITAL_BASED_OUTPATIENT_CLINIC_OR_DEPARTMENT_OTHER): Payer: Medicare Other

## 2014-07-31 VITALS — BP 125/82 | HR 97 | Temp 98.3°F | Resp 18 | Ht 67.0 in | Wt 260.7 lb

## 2014-07-31 DIAGNOSIS — C3491 Malignant neoplasm of unspecified part of right bronchus or lung: Secondary | ICD-10-CM

## 2014-07-31 DIAGNOSIS — C7951 Secondary malignant neoplasm of bone: Secondary | ICD-10-CM | POA: Diagnosis not present

## 2014-07-31 DIAGNOSIS — C3410 Malignant neoplasm of upper lobe, unspecified bronchus or lung: Secondary | ICD-10-CM

## 2014-07-31 DIAGNOSIS — R21 Rash and other nonspecific skin eruption: Secondary | ICD-10-CM | POA: Diagnosis not present

## 2014-07-31 DIAGNOSIS — C3490 Malignant neoplasm of unspecified part of unspecified bronchus or lung: Secondary | ICD-10-CM

## 2014-07-31 LAB — CBC WITH DIFFERENTIAL/PLATELET
BASO%: 0.3 % (ref 0.0–2.0)
Basophils Absolute: 0 10*3/uL (ref 0.0–0.1)
EOS%: 1.4 % (ref 0.0–7.0)
Eosinophils Absolute: 0.1 10*3/uL (ref 0.0–0.5)
HEMATOCRIT: 36.7 % (ref 34.8–46.6)
HGB: 12.1 g/dL (ref 11.6–15.9)
LYMPH%: 31.4 % (ref 14.0–49.7)
MCH: 31.6 pg (ref 25.1–34.0)
MCHC: 33 g/dL (ref 31.5–36.0)
MCV: 95.8 fL (ref 79.5–101.0)
MONO#: 0.6 10*3/uL (ref 0.1–0.9)
MONO%: 8.9 % (ref 0.0–14.0)
NEUT#: 3.7 10*3/uL (ref 1.5–6.5)
NEUT%: 58 % (ref 38.4–76.8)
Platelets: 277 10*3/uL (ref 145–400)
RBC: 3.83 10*6/uL (ref 3.70–5.45)
RDW: 14.6 % — ABNORMAL HIGH (ref 11.2–14.5)
WBC: 6.3 10*3/uL (ref 3.9–10.3)
lymph#: 2 10*3/uL (ref 0.9–3.3)

## 2014-07-31 LAB — COMPREHENSIVE METABOLIC PANEL (CC13)
ALT: 12 U/L (ref 0–55)
ANION GAP: 10 meq/L (ref 3–11)
AST: 17 U/L (ref 5–34)
Albumin: 3.6 g/dL (ref 3.5–5.0)
Alkaline Phosphatase: 98 U/L (ref 40–150)
BUN: 14.3 mg/dL (ref 7.0–26.0)
CHLORIDE: 104 meq/L (ref 98–109)
CO2: 27 mEq/L (ref 22–29)
Calcium: 8.9 mg/dL (ref 8.4–10.4)
Creatinine: 0.8 mg/dL (ref 0.6–1.1)
GLUCOSE: 115 mg/dL (ref 70–140)
Potassium: 3.8 mEq/L (ref 3.5–5.1)
SODIUM: 140 meq/L (ref 136–145)
TOTAL PROTEIN: 7.6 g/dL (ref 6.4–8.3)
Total Bilirubin: 0.55 mg/dL (ref 0.20–1.20)

## 2014-07-31 MED ORDER — TEMAZEPAM 15 MG PO CAPS: ORAL_CAPSULE | ORAL | Status: DC

## 2014-07-31 MED ORDER — OXYCODONE HCL 10 MG PO TABS: 10.0000 mg | ORAL_TABLET | Freq: Four times a day (QID) | ORAL | Status: DC | PRN

## 2014-07-31 NOTE — Progress Notes (Addendum)
[place] [phone]   [phone]) [phone]  OFFICE PROGRESS NOTE  [name], MD [place] [address]  DIAGNOSIS AND STAGE: Metastatic non-small cell lung cancer, adenocarcinoma with positive EGFR mutation in exon 19 diagnosed in June of 2013   PRIOR THERAPY: palliative radiotherapy to the right lung under the care of Dr. Valere Dross expected to be completed on 10/29/2011.   CURRENT THERAPY:  1. [name] 150 mg by mouth daily started 10/11/2011. Status post 32 months of therapy 2. Xgeva 120 mg subcutaneously every 8 weeks.  CHEMOTHERAPY INTENT: Palliative  CURRENT # OF CHEMOTHERAPY CYCLES: 33 CURRENT ANTIEMETICS: Compazine  CURRENT SMOKING STATUS: Former smoker but quit.  ORAL CHEMOTHERAPY AND CONSENT: Oral [name]  CURRENT BISPHOSPHONATES USE: Xgeva  PAIN MANAGEMENT: well-controlled with oxycodone when necessary.  NARCOTICS INDUCED CONSTIPATION: None  LIVING WILL AND CODE STATUS: Full code.   INTERVAL HISTORY: Beth [name] 54 y.o. female returns to the clinic today for followup visit. The patient is feeling fine today with no specific complaints. She denied having any significant chest pain, shortness of breath, cough or hemoptysis. She denied having any significant skin rash or diarrhea. The patient denied having any significant weight loss or night sweats. She is tolerating her treatment with [name] fairly well. She requests refill prescriptions for her painmedication and sleeping pill.  MEDICAL HISTORY: Past Medical History  Diagnosis Date  . Asthma   . GERD (gastroesophageal reflux disease)   . Hypertension   . Heart murmur   . Fibromyalgia   . Arthritis of knee, degenerative   . Endocarditis     as teenager  . Complication of anesthesia     difficulty waking up  . Shortness of breath   . Lung mass     R- ADENOCARCINOMA  . Sleep apnea     no longer using CPAP  . Pleural effusion 08/30/11  . Anxiety     . Depression   . History of radiation therapy 09/29/11-11/04/2011    right lung 2700cGy 15 sessions  . Osseous metastasis 09/20/11    per PET scan    ALLERGIES:  is allergic to meloxicam.  MEDICATIONS:  Current Outpatient Prescriptions  Medication Sig Dispense Refill  . cloNIDine (CATAPRES) 0.1 MG tablet Take 1 tablet by mouth daily as needed for diastolic blood pressure over 100 10 tablet 0  . diphenhydrAMINE (BENADRYL) 25 mg capsule One to two tabs every 6 hours prn allergies 120 capsule 3  . fish oil-omega-3 fatty acids 1000 MG capsule Take 2 g by mouth daily.    Marland Kitchen guaiFENesin-codeine (ROBITUSSIN AC) 100-10 MG/5ML syrup Take 5 mLs by mouth 3 (three) times daily as needed for cough. 120 mL 0  . hydrochlorothiazide (HYDRODIURIL) 25 MG tablet TAKE 1 TABLET BY MOUTH DAILY 30 tablet 5  . Ipratropium-Albuterol (COMBIVENT) 20-100 MCG/ACT AERS respimat Inhale 1 puff into the lungs every 6 (six) hours as needed for wheezing. 1 Inhaler 2  . loperamide (IMODIUM) 2 MG capsule Take 2 mg by mouth 4 (four) times daily as needed for diarrhea or loose stools.    . mometasone (NASONEX) 50 MCG/ACT nasal spray Place 2 sprays into the nose daily. 17 g 12  . Multiple Vitamins-Minerals (MULTIVITAMIN WITH MINERALS) tablet Take 1 tablet by mouth daily.    . Oxycodone HCl 10 MG TABS Take 1 tablet (10 mg total) by mouth every 8 (eight) hours as needed. For pain 10 tablet 0  . Oxycodone HCl 10  MG TABS Take 1 tablet (10 mg total) by mouth every 6 (six) hours as needed. 40 tablet 0  . prochlorperazine (COMPAZINE) 10 MG tablet Take 1 tablet (10 mg total) by mouth every 6 (six) hours as needed. 30 tablet 1  . [name] 150 MG tablet Take 1 tablet by mouth  daily on an empty stomach 1 hour before or 2 hours  after a meal 30 tablet 0  . temazepam (RESTORIL) 15 MG capsule TAKE ONE CAPSULE AT BEDTIME AS NEEDED 30 capsule 0  . venlafaxine XR (EFFEXOR-XR) 150 MG 24 hr capsule TAKE ONE CAPSULE BY MOUTH EVERY DAY 30 capsule 0    No current facility-administered medications for this visit.    SURGICAL HISTORY:  Past Surgical History  Procedure Laterality Date  . Tubal ligation  2002  . Thoracentesis  09/02/11, 09/09/11    right-side pleural effusion    REVIEW OF SYSTEMS:  Constitutional: negative Eyes: negative Ears, nose, mouth, throat, and face: negative Respiratory: positive for dyspnea on exertion Cardiovascular: negative Gastrointestinal: negative Genitourinary:negative Integument/breast: negative Hematologic/lymphatic: negative Musculoskeletal:negative Neurological: negative Behavioral/Psych: negative Endocrine: negative Allergic/Immunologic: negative   PHYSICAL EXAMINATION: General appearance: alert, cooperative and no distress Head: Normocephalic, without obvious abnormality, atraumatic Neck: no adenopathy, no JVD, supple, symmetrical, trachea midline and thyroid not enlarged, symmetric, no tenderness/mass/nodules Lymph nodes: Cervical, supraclavicular, and axillary nodes normal. Resp: diminished breath sounds RLL and dullness to percussion RLL Back: symmetric, no curvature. ROM normal. No CVA tenderness. Cardio: regular rate and rhythm, S1, S2 normal, no murmur, click, rub or gallop GI: soft, non-tender; bowel sounds normal; no masses,  no organomegaly Extremities: extremities normal, atraumatic, no cyanosis or edema. There is darkening of the skin and swelling of the right fourth toe. Neurologic: Alert and oriented X 3, normal strength and tone. Normal symmetric reflexes. Normal coordination and gait  ECOG PERFORMANCE STATUS: 1 - Symptomatic but completely ambulatory  Blood pressure 125/82, pulse 97, temperature 98.3 F (36.8 C), temperature source Oral, resp. rate 18, height '5\' 7"'  (1.702 m), weight 260 lb 11.2 oz (118.253 kg), SpO2 99 %.  LABORATORY DATA: Lab Results  Component Value Date   WBC 6.3 07/31/2014   HGB 12.1 07/31/2014   HCT 36.7 07/31/2014   MCV 95.8 07/31/2014   PLT  277 07/31/2014      Chemistry      Component Value Date/Time   NA 140 07/31/2014 1400   NA 139 05/23/2012 1305   K 3.8 07/31/2014 1400   K 4.0 05/23/2012 1305   CL 104 08/18/2012 1154   CL 100 05/23/2012 1305   CO2 27 07/31/2014 1400   CO2 30 05/23/2012 1305   BUN 14.3 07/31/2014 1400   BUN 16 05/23/2012 1305   CREATININE 0.8 07/31/2014 1400   CREATININE 0.74 05/23/2012 1305      Component Value Date/Time   CALCIUM 8.9 07/31/2014 1400   CALCIUM 9.0 05/23/2012 1305   ALKPHOS 98 07/31/2014 1400   ALKPHOS 113 05/23/2012 1305   AST 17 07/31/2014 1400   AST 15 05/23/2012 1305   ALT 12 07/31/2014 1400   ALT 7 05/23/2012 1305   BILITOT 0.55 07/31/2014 1400   BILITOT 0.6 05/23/2012 1305       RADIOGRAPHIC STUDIES:  ASSESSMENT AND PLAN: This is a very pleasant 54 years old [place] American female with:  1)  metastatic non-small cell lung cancer, adenocarcinoma with positive EGFR mutation in exon 19 currently undergoing systemic treatment with oral [name] status post 32 months of treatment.  The patient is tolerating her treatment fairly well with no significant adverse effects. The recent CT scan of the chest, abdomen and pelvis showed no evidence for disease progression. The patient was discussed with and also seen by Dr. Julien Nordmann. She will continue on [name] 150 mg by mouth daily. She would come back for follow-up visit in 6 weeks for reevaluation with repeat blood work.  2) metastatic bone disease:  the patient will continue on Xgeva 120 mg subcutaneously every 8 weeks. She was giving a refill of her pain medication. The patient voices understanding of current disease status and treatment options and is in agreement with the current care plan.  She was given a refill prescription for her 10 mg oxycodone tablets and her Restoril.  All questions were answered. The patient knows to call the clinic with any problems, questions or concerns. We can certainly see the patient much  sooner if necessary.  Carlton Adam, PA-C 07/31/2014   ADDENDUM: Hematology/Oncology Attending: I had a face to face encounter with the patient. I recommended her care plan. This is a very pleasant 54 years old African-American female with metastatic non-small cell lung cancer, adenocarcinoma with positive EGFR mutation who is currently undergoing treatment with [name] 150 mg by mouth daily status post 32 cycles. The patient is tolerating her treatment with [name] fairly well with no significant adverse effects except for mild skin rash but no significant episodes of diarrhea. I recommended for the patient to continue her treatment with [name] as a scheduled. She will also continue on treatment with Xgeva for the bone metastasis. The patient was given a refill of her pain medication. The patient would come back for follow-up visit in one month for reevaluation. She was advised to call immediately if she has any concerning symptoms in the interval.  Disclaimer: This note was dictated with voice recognition software. Similar sounding words can inadvertently be transcribed and may be missed upon review. Eilleen Kempf., MD 08/04/2014

## 2014-08-01 ENCOUNTER — Telehealth: Payer: Self-pay | Admitting: Internal Medicine

## 2014-08-01 NOTE — Telephone Encounter (Signed)
lvm for pt regarding to [facility] appt.Marland KitchenMarland KitchenMarland Kitchen

## 2014-08-04 NOTE — Patient Instructions (Signed)
Follow-up in 6 weeks

## 2014-08-07 ENCOUNTER — Telehealth: Payer: Self-pay | Admitting: Internal Medicine

## 2014-08-07 NOTE — Telephone Encounter (Signed)
returned call and r/s inj per pof.Marland KitchenMarland KitchenMarland KitchenMarland Kitchenpt ok a;dn aware

## 2014-08-20 ENCOUNTER — Telehealth: Payer: Self-pay | Admitting: *Deleted

## 2014-08-20 NOTE — Telephone Encounter (Signed)
VM message from pt requesting refill of her [name]. She states she has 2 left.

## 2014-08-21 ENCOUNTER — Other Ambulatory Visit: Payer: Self-pay | Admitting: Medical Oncology

## 2014-08-21 ENCOUNTER — Other Ambulatory Visit: Payer: Self-pay | Admitting: Internal Medicine

## 2014-08-21 DIAGNOSIS — C3490 Malignant neoplasm of unspecified part of unspecified bronchus or lung: Secondary | ICD-10-CM

## 2014-08-21 MED ORDER — OXYCODONE HCL 10 MG PO TABS: 10.0000 mg | ORAL_TABLET | Freq: Four times a day (QID) | ORAL | Status: DC | PRN

## 2014-08-21 NOTE — Telephone Encounter (Signed)
tarceva refilled and pain med. Pt notified to pick up in an hour.

## 2014-08-22 NOTE — Progress Notes (Signed)
Pt notified to pick up rx.

## 2014-08-27 ENCOUNTER — Telehealth: Payer: Self-pay

## 2014-08-27 NOTE — Telephone Encounter (Signed)
Pt wants a referral to a GYN for a pap/mammogram. She went to urgent care and received bad results from her pap.

## 2014-08-27 NOTE — Telephone Encounter (Signed)
OK to refer.  There are many good GYNs in town.  Consider Dr Kendall Flack if she is taking any new patients.

## 2014-08-27 NOTE — Telephone Encounter (Signed)
Pt. Declined [place] setting up a mammogram for her.  However, she is asking for a referral to a gyn specialist who also do mammograms as well as pap.  She has recently had a "bad" pap from an urgent care and it was recommended that she find a gyn specialist.

## 2014-08-28 ENCOUNTER — Other Ambulatory Visit: Payer: Self-pay | Admitting: Family Medicine

## 2014-08-28 DIAGNOSIS — R87629 Unspecified abnormal cytological findings in specimens from vagina: Secondary | ICD-10-CM

## 2014-08-28 NOTE — Telephone Encounter (Signed)
Referral is ordered

## 2014-09-05 ENCOUNTER — Telehealth: Payer: Self-pay | Admitting: Medical Oncology

## 2014-09-05 NOTE — Telephone Encounter (Signed)
Pt reports Temazepam 15 mg is not working and requests dose increase. Note to [facility]

## 2014-09-09 ENCOUNTER — Telehealth: Payer: Self-pay | Admitting: *Deleted

## 2014-09-09 NOTE — Telephone Encounter (Signed)
Instructed pt to increase Temazepam to 2 tablets for now until the next refill is needed.

## 2014-09-09 NOTE — Telephone Encounter (Signed)
-----   Message from Curt Bears, MD sent at 09/06/2014  9:52 AM EDT ----- Increase to 30 mg. She can take 2 tab of the 15 mg for now until the next refill. ----- Message -----    From: Ardeen Garland, RN    Sent: 09/05/2014   5:24 PM      To: Lucile Crater, RN, Curt Bears, MD  Pt reports Temazepam 15 mg is not working and requests dose increase. return number [id number]

## 2014-09-11 ENCOUNTER — Other Ambulatory Visit (HOSPITAL_BASED_OUTPATIENT_CLINIC_OR_DEPARTMENT_OTHER): Payer: Medicare Other

## 2014-09-11 ENCOUNTER — Ambulatory Visit (HOSPITAL_COMMUNITY): Payer: Medicare Other

## 2014-09-11 ENCOUNTER — Ambulatory Visit (HOSPITAL_BASED_OUTPATIENT_CLINIC_OR_DEPARTMENT_OTHER): Payer: Medicare Other | Admitting: Internal Medicine

## 2014-09-11 ENCOUNTER — Ambulatory Visit: Payer: Medicare Other

## 2014-09-11 ENCOUNTER — Encounter: Payer: Self-pay | Admitting: Internal Medicine

## 2014-09-11 ENCOUNTER — Telehealth: Payer: Self-pay | Admitting: Internal Medicine

## 2014-09-11 VITALS — BP 141/95 | HR 103 | Temp 99.2°F | Resp 17 | Ht 67.0 in | Wt 265.8 lb

## 2014-09-11 DIAGNOSIS — C3490 Malignant neoplasm of unspecified part of unspecified bronchus or lung: Secondary | ICD-10-CM

## 2014-09-11 DIAGNOSIS — C3411 Malignant neoplasm of upper lobe, right bronchus or lung: Secondary | ICD-10-CM

## 2014-09-11 DIAGNOSIS — C7951 Secondary malignant neoplasm of bone: Secondary | ICD-10-CM

## 2014-09-11 DIAGNOSIS — C3491 Malignant neoplasm of unspecified part of right bronchus or lung: Secondary | ICD-10-CM

## 2014-09-11 DIAGNOSIS — J019 Acute sinusitis, unspecified: Secondary | ICD-10-CM

## 2014-09-11 DIAGNOSIS — G893 Neoplasm related pain (acute) (chronic): Secondary | ICD-10-CM

## 2014-09-11 DIAGNOSIS — C3410 Malignant neoplasm of upper lobe, unspecified bronchus or lung: Secondary | ICD-10-CM

## 2014-09-11 DIAGNOSIS — J329 Chronic sinusitis, unspecified: Secondary | ICD-10-CM | POA: Insufficient documentation

## 2014-09-11 LAB — COMPREHENSIVE METABOLIC PANEL (CC13)
ALBUMIN: 3.6 g/dL (ref 3.5–5.0)
ALK PHOS: 76 U/L (ref 40–150)
ALT: 12 U/L (ref 0–55)
AST: 16 U/L (ref 5–34)
Anion Gap: 6 mEq/L (ref 3–11)
BUN: 15.5 mg/dL (ref 7.0–26.0)
CO2: 32 mEq/L — ABNORMAL HIGH (ref 22–29)
Calcium: 9.6 mg/dL (ref 8.4–10.4)
Chloride: 103 mEq/L (ref 98–109)
Creatinine: 0.8 mg/dL (ref 0.6–1.1)
EGFR: >90 mL/min/{1.73_m2} (ref >90)
Glucose: 92 mg/dl (ref 70–140)
POTASSIUM: 3.9 meq/L (ref 3.5–5.1)
SODIUM: 142 meq/L (ref 136–145)
TOTAL PROTEIN: 7.5 g/dL (ref 6.4–8.3)
Total Bilirubin: 0.4 mg/dL (ref 0.20–1.20)

## 2014-09-11 LAB — CBC WITH DIFFERENTIAL/PLATELET
BASO%: 0.4 % (ref 0.0–2.0)
Basophils Absolute: 0 10*3/uL (ref 0.0–0.1)
EOS%: 3.6 % (ref 0.0–7.0)
Eosinophils Absolute: 0.2 10*3/uL (ref 0.0–0.5)
HEMATOCRIT: 35 % (ref 34.8–46.6)
HGB: 11.4 g/dL — ABNORMAL LOW (ref 11.6–15.9)
LYMPH%: 33.6 % (ref 14.0–49.7)
MCH: 30.7 pg (ref 25.1–34.0)
MCHC: 32.6 g/dL (ref 31.5–36.0)
MCV: 94.3 fL (ref 79.5–101.0)
MONO#: 0.5 10*3/uL (ref 0.1–0.9)
MONO%: 8.1 % (ref 0.0–14.0)
NEUT#: 3 10*3/uL (ref 1.5–6.5)
NEUT%: 54.3 % (ref 38.4–76.8)
PLATELETS: 254 10*3/uL (ref 145–400)
RBC: 3.71 10*6/uL (ref 3.70–5.45)
RDW: 13.8 % (ref 11.2–14.5)
WBC: 5.5 10*3/uL (ref 3.9–10.3)
lymph#: 1.9 10*3/uL (ref 0.9–3.3)

## 2014-09-11 MED ORDER — AZITHROMYCIN 250 MG PO TABS: ORAL_TABLET | ORAL | Status: DC

## 2014-09-11 MED ORDER — OXYCODONE HCL 10 MG PO TABS: 10.0000 mg | ORAL_TABLET | Freq: Four times a day (QID) | ORAL | Status: DC | PRN

## 2014-09-11 NOTE — Progress Notes (Signed)
[place] [phone]   [phone]  OFFICE PROGRESS NOTE  [name], MD [place] [address]  DIAGNOSIS AND STAGE: Metastatic non-small cell lung cancer, adenocarcinoma with positive EGFR mutation in exon 19 diagnosed in June of 2013   PRIOR THERAPY: palliative radiotherapy to the right lung under the care of Dr. Valere Dross expected to be completed on 10/29/2011.   CURRENT THERAPY:  1. [name] 150 mg by mouth daily started 10/11/2011. Status post 33 months of therapy 2. Xgeva 120 mg subcutaneously every 8 weeks.  CHEMOTHERAPY INTENT: Palliative  CURRENT # OF CHEMOTHERAPY CYCLES: 35 CURRENT ANTIEMETICS: Compazine  CURRENT SMOKING STATUS: Former smoker but quit.  ORAL CHEMOTHERAPY AND CONSENT: Oral [name]  CURRENT BISPHOSPHONATES USE: Xgeva  PAIN MANAGEMENT: well-controlled with oxycodone when necessary.  NARCOTICS INDUCED CONSTIPATION: None  LIVING WILL AND CODE STATUS: Full code.   INTERVAL HISTORY: Beth [name] 54 y.o. female returns to the clinic today for followup visit. The patient is feeling fine today with no specific complaints except for her sinus drainage and she is requesting antibiotics. She denied having any significant chest pain, shortness of breath, cough or hemoptysis. She denied having any significant skin rash or diarrhea. The patient denied having any significant weight loss or night sweats. She is tolerating her treatment with [name] fairly well. She was supposed to have CT scan of the neck, chest, abdomen and pelvis earlier today before her visit but unfortunately she missed her appointment. She is here today for evaluation and repeat blood work.  MEDICAL HISTORY: Past Medical History  Diagnosis Date  . Asthma   . GERD (gastroesophageal reflux disease)   . Hypertension   . Heart murmur   . Fibromyalgia   . Arthritis of knee, degenerative   . Endocarditis     as teenager  .  Complication of anesthesia     difficulty waking up  . Shortness of breath   . Lung mass     R- ADENOCARCINOMA  . Sleep apnea     no longer using CPAP  . Pleural effusion 08/30/11  . Anxiety   . Depression   . History of radiation therapy 09/29/11-11/04/2011    right lung 2700cGy 15 sessions  . Osseous metastasis 09/20/11    per PET scan    ALLERGIES:  is allergic to meloxicam.  MEDICATIONS:  Current Outpatient Prescriptions  Medication Sig Dispense Refill  . cloNIDine (CATAPRES) 0.1 MG tablet Take 1 tablet by mouth daily as needed for diastolic blood pressure over 100 10 tablet 0  . fish oil-omega-3 fatty acids 1000 MG capsule Take 2 g by mouth daily.    Marland Kitchen guaiFENesin-codeine (ROBITUSSIN AC) 100-10 MG/5ML syrup Take 5 mLs by mouth 3 (three) times daily as needed for cough. 120 mL 0  . hydrochlorothiazide (HYDRODIURIL) 25 MG tablet TAKE 1 TABLET BY MOUTH DAILY 30 tablet 5  . Ipratropium-Albuterol (COMBIVENT) 20-100 MCG/ACT AERS respimat Inhale 1 puff into the lungs every 6 (six) hours as needed for wheezing. 1 Inhaler 2  . loperamide (IMODIUM) 2 MG capsule Take 2 mg by mouth 4 (four) times daily as needed for diarrhea or loose stools.    . mometasone (NASONEX) 50 MCG/ACT nasal spray Place 2 sprays into the nose daily. 17 g 12  . Multiple Vitamins-Minerals (MULTIVITAMIN WITH MINERALS) tablet Take 1 tablet by mouth daily.    . Oxycodone HCl 10 MG TABS Take 1 tablet (10 mg total) by mouth every  8 (eight) hours as needed. For pain 10 tablet 0  . Oxycodone HCl 10 MG TABS Take 1 tablet (10 mg total) by mouth every 6 (six) hours as needed. 40 tablet 0  . Oxycodone HCl 10 MG TABS Take 1 tablet (10 mg total) by mouth every 6 (six) hours as needed. 50 tablet 0  . prochlorperazine (COMPAZINE) 10 MG tablet Take 1 tablet (10 mg total) by mouth every 6 (six) hours as needed. 30 tablet 1  . [name] 150 MG tablet Take 1 tablet by mouth  daily on an empty stomach 1 hour before or 2 hours  after a meal 30  tablet 1  . temazepam (RESTORIL) 15 MG capsule TAKE ONE CAPSULE AT BEDTIME AS NEEDED 30 capsule 0  . venlafaxine XR (EFFEXOR-XR) 150 MG 24 hr capsule TAKE ONE CAPSULE BY MOUTH EVERY DAY 30 capsule 0   No current facility-administered medications for this visit.    SURGICAL HISTORY:  Past Surgical History  Procedure Laterality Date  . Tubal ligation  2002  . Thoracentesis  09/02/11, 09/09/11    right-side pleural effusion    REVIEW OF SYSTEMS:  Constitutional: negative Eyes: negative Ears, nose, mouth, throat, and face: negative Respiratory: positive for dyspnea on exertion Cardiovascular: negative Gastrointestinal: negative Genitourinary:negative Integument/breast: negative Hematologic/lymphatic: negative Musculoskeletal:negative Neurological: negative Behavioral/Psych: negative Endocrine: negative Allergic/Immunologic: negative   PHYSICAL EXAMINATION: General appearance: alert, cooperative and no distress Head: Normocephalic, without obvious abnormality, atraumatic Neck: no adenopathy, no JVD, supple, symmetrical, trachea midline and thyroid not enlarged, symmetric, no tenderness/mass/nodules Lymph nodes: Cervical, supraclavicular, and axillary nodes normal. Resp: diminished breath sounds RLL and dullness to percussion RLL Back: symmetric, no curvature. ROM normal. No CVA tenderness. Cardio: regular rate and rhythm, S1, S2 normal, no murmur, click, rub or gallop GI: soft, non-tender; bowel sounds normal; no masses,  no organomegaly Extremities: extremities normal, atraumatic, no cyanosis or edema. There is darkening of the skin and swelling of the right fourth toe. Neurologic: Alert and oriented X 3, normal strength and tone. Normal symmetric reflexes. Normal coordination and gait  ECOG PERFORMANCE STATUS: 1 - Symptomatic but completely ambulatory  Blood pressure 141/95, pulse 103, temperature 99.2 F (37.3 C), temperature source Oral, resp. rate 17, height '5\' 7"'  (1.702 m),  weight 265 lb 12.8 oz (120.566 kg), SpO2 98 %.  LABORATORY DATA: Lab Results  Component Value Date   WBC 5.5 09/11/2014   HGB 11.4* 09/11/2014   HCT 35.0 09/11/2014   MCV 94.3 09/11/2014   PLT 254 09/11/2014      Chemistry      Component Value Date/Time   NA 140 07/31/2014 1400   NA 139 05/23/2012 1305   K 3.8 07/31/2014 1400   K 4.0 05/23/2012 1305   CL 104 08/18/2012 1154   CL 100 05/23/2012 1305   CO2 27 07/31/2014 1400   CO2 30 05/23/2012 1305   BUN 14.3 07/31/2014 1400   BUN 16 05/23/2012 1305   CREATININE 0.8 07/31/2014 1400   CREATININE 0.74 05/23/2012 1305      Component Value Date/Time   CALCIUM 8.9 07/31/2014 1400   CALCIUM 9.0 05/23/2012 1305   ALKPHOS 98 07/31/2014 1400   ALKPHOS 113 05/23/2012 1305   AST 17 07/31/2014 1400   AST 15 05/23/2012 1305   ALT 12 07/31/2014 1400   ALT 7 05/23/2012 1305   BILITOT 0.55 07/31/2014 1400   BILITOT 0.6 05/23/2012 1305       RADIOGRAPHIC STUDIES:  ASSESSMENT AND PLAN: This is  a very pleasant 54 years old [place] American female with:  1)  Metastatic non-small cell lung cancer, adenocarcinoma with positive EGFR mutation in exon 19 currently undergoing systemic treatment with oral [name] status post 34 months of treatment. The patient is tolerating her treatment fairly well with no significant adverse effects. I recommended for her to continue on [name] 150 mg by mouth daily. I will arrange for her to have repeat CT scan of the neck, chest, abdomen and pelvis tomorrow for restaging of her disease. If she has no evidence for disease progression, she would come back for follow-up visit in 6 weeks for reevaluation with repeat blood work.  2) Metastatic bone disease:  the patient will continue on Xgeva 120 mg subcutaneously every 8 weeks.   3) For pain management, She was giving a refill of her pain medication with OxyContin 10 mg by mouth every 6 hours as needed for pain #90  4) For sinusitis, I started the patient  on the back and she was advised to contact her primary care physician if no improvement.  The patient voices understanding of current disease status and treatment options and is in agreement with the current care plan.  All questions were answered. The patient knows to call the clinic with any problems, questions or concerns. We can certainly see the patient much sooner if necessary.  Disclaimer: This note was dictated with voice recognition software. Similar sounding words can inadvertently be transcribed and may be missed upon review.

## 2014-09-11 NOTE — Telephone Encounter (Signed)
lvm for pt regarding to Aug appt... °

## 2014-09-11 NOTE — Addendum Note (Signed)
Addended by: Lucile Crater on: 09/11/2014 02:44 PM   Modules accepted: Orders

## 2014-09-12 ENCOUNTER — Ambulatory Visit: Payer: Medicare Other

## 2014-09-12 ENCOUNTER — Ambulatory Visit (HOSPITAL_COMMUNITY): Admission: RE | Admit: 2014-09-12 | Payer: Medicare Other | Source: Ambulatory Visit

## 2014-09-12 ENCOUNTER — Ambulatory Visit (HOSPITAL_COMMUNITY)
Admission: RE | Admit: 2014-09-12 | Discharge: 2014-09-12 | Disposition: A | Discharge status: Home / Self Care | Payer: Medicare Other | Source: Ambulatory Visit | Attending: Physician Assistant | Admitting: Physician Assistant

## 2014-09-12 ENCOUNTER — Ambulatory Visit (HOSPITAL_COMMUNITY): Payer: Medicare Other

## 2014-09-12 DIAGNOSIS — C7951 Secondary malignant neoplasm of bone: Secondary | ICD-10-CM | POA: Diagnosis not present

## 2014-09-12 DIAGNOSIS — Z87891 Personal history of nicotine dependence: Secondary | ICD-10-CM | POA: Insufficient documentation

## 2014-09-12 DIAGNOSIS — C3490 Malignant neoplasm of unspecified part of unspecified bronchus or lung: Secondary | ICD-10-CM | POA: Diagnosis not present

## 2014-09-12 MED ORDER — IOHEXOL 300 MG/ML  SOLN
100.0000 mL | Freq: Once | INTRAMUSCULAR | Status: AC | PRN
  Administered 2014-09-12: 100 mL via INTRAVENOUS

## 2014-09-16 ENCOUNTER — Telehealth: Payer: Self-pay | Admitting: *Deleted

## 2014-09-16 NOTE — Telephone Encounter (Signed)
Patient called requesting lab and CT results.  Informed her of labs stable and wnl in addition to Neck/C/A/P results.  No mets found. Reports her right neck has had some enlargement protruding 1 inch toward shoulder.  Assured her no lymph node problems in this area.

## 2014-09-19 ENCOUNTER — Other Ambulatory Visit: Payer: Self-pay | Admitting: Obstetrics and Gynecology

## 2014-09-19 DIAGNOSIS — N871 Moderate cervical dysplasia: Secondary | ICD-10-CM | POA: Diagnosis not present

## 2014-09-19 DIAGNOSIS — R87612 Low grade squamous intraepithelial lesion on cytologic smear of cervix (LGSIL): Secondary | ICD-10-CM | POA: Diagnosis not present

## 2014-09-24 ENCOUNTER — Telehealth: Payer: Self-pay | Admitting: Family Medicine

## 2014-09-24 NOTE — Telephone Encounter (Signed)
Pt has been scheduled.  °

## 2014-09-24 NOTE — Telephone Encounter (Signed)
Yes that is fine

## 2014-09-24 NOTE — Telephone Encounter (Signed)
Pt would like to see dr Elease Hashimoto for her fibromyalgia. No appts except SD.is it ok to use a same day for pt?

## 2014-09-25 ENCOUNTER — Ambulatory Visit (INDEPENDENT_AMBULATORY_CARE_PROVIDER_SITE_OTHER): Payer: Medicare Other | Admitting: Family Medicine

## 2014-09-25 ENCOUNTER — Encounter: Payer: Self-pay | Admitting: Family Medicine

## 2014-09-25 VITALS — BP 126/80 | HR 100 | Temp 98.2°F | Wt 266.0 lb

## 2014-09-25 DIAGNOSIS — M25561 Pain in right knee: Secondary | ICD-10-CM

## 2014-09-25 NOTE — Progress Notes (Signed)
   Subjective:    Patient ID: Beth [name], female    DOB: [birth year], 54 y.o.   MRN: [id number]  HPI Acute visit for right knee pain. Week ago she was walking and felt a "pop "deep within her right knee. She has not had any locking or giving way. She's been icing her knee and this is somewhat better. Mild swelling. No ecchymosis. Requesting orthopedic referral.  Her chronic problems include metastatic lung cancer which was diagnosed about 3 years ago. She remains on [name]. She's also taking regular oxycodone 10 mg 3 times a day per oncology. Question of upper GI bleed with meloxicam previously   Review of Systems  Constitutional: Negative for fever, chills and appetite change.  Respiratory: Negative for shortness of breath.        Objective:   Physical Exam  Constitutional: She appears well-developed and well-nourished.  Cardiovascular: Normal rate and regular rhythm.   Pulmonary/Chest: Effort normal and breath sounds normal.  Musculoskeletal:  Right knee no effusion. She has moderate medial joint line tenderness. No warmth or erythema. No ecchymosis. Anterior and posterior cruciate ligament testing is normal. No lateral collateral ligament instability          Assessment & Plan:  Right knee pain. Etiology unclear. Patient requesting orthopedic referral. She will continue icing in the meantime.

## 2014-09-25 NOTE — Progress Notes (Signed)
Pre visit review using our clinic review tool, if applicable. No additional management support is needed unless otherwise documented below in the visit note. 

## 2014-10-02 ENCOUNTER — Other Ambulatory Visit: Payer: Self-pay | Admitting: Medical Oncology

## 2014-10-02 DIAGNOSIS — C3490 Malignant neoplasm of unspecified part of unspecified bronchus or lung: Secondary | ICD-10-CM

## 2014-10-02 MED ORDER — ERLOTINIB HCL 150 MG PO TABS: 150.0000 mg | ORAL_TABLET | Freq: Every day | ORAL | Status: DC

## 2014-10-07 ENCOUNTER — Telehealth: Payer: Self-pay | Admitting: *Deleted

## 2014-10-07 ENCOUNTER — Other Ambulatory Visit: Payer: Self-pay | Admitting: Medical Oncology

## 2014-10-07 DIAGNOSIS — C3490 Malignant neoplasm of unspecified part of unspecified bronchus or lung: Secondary | ICD-10-CM

## 2014-10-07 MED ORDER — OXYCODONE HCL 10 MG PO TABS: 10.0000 mg | ORAL_TABLET | Freq: Four times a day (QID) | ORAL | Status: DC | PRN

## 2014-10-07 NOTE — Telephone Encounter (Signed)
Patient called asking if pain medication is ready.  Observed oxycodone 10 mg is ready for pick up.  With this call, notified her.

## 2014-10-07 NOTE — Progress Notes (Signed)
Pt called for refill. Ready for pick up.

## 2014-10-09 ENCOUNTER — Ambulatory Visit: Payer: Medicare Other | Admitting: Obstetrics & Gynecology

## 2014-10-17 ENCOUNTER — Other Ambulatory Visit: Payer: Self-pay

## 2014-10-17 DIAGNOSIS — R87613 High grade squamous intraepithelial lesion on cytologic smear of cervix (HGSIL): Secondary | ICD-10-CM | POA: Diagnosis not present

## 2014-10-17 DIAGNOSIS — N888 Other specified noninflammatory disorders of cervix uteri: Secondary | ICD-10-CM | POA: Diagnosis not present

## 2014-10-17 DIAGNOSIS — N72 Inflammatory disease of cervix uteri: Secondary | ICD-10-CM | POA: Diagnosis not present

## 2014-10-24 ENCOUNTER — Ambulatory Visit: Payer: Medicare Other

## 2014-10-24 ENCOUNTER — Ambulatory Visit: Payer: Medicare Other | Admitting: Physician Assistant

## 2014-10-24 ENCOUNTER — Other Ambulatory Visit: Payer: Medicare Other

## 2014-10-25 ENCOUNTER — Telehealth: Payer: Self-pay | Admitting: Internal Medicine

## 2014-10-25 NOTE — Telephone Encounter (Signed)
pt called to r/s missed appt.Marland KitchenMarland KitchenMarland KitchenMarland Kitchenpt ok and aware of new d.t

## 2014-10-31 ENCOUNTER — Encounter: Payer: Self-pay | Admitting: Physician Assistant

## 2014-10-31 ENCOUNTER — Ambulatory Visit (HOSPITAL_BASED_OUTPATIENT_CLINIC_OR_DEPARTMENT_OTHER): Payer: Medicare Other

## 2014-10-31 ENCOUNTER — Other Ambulatory Visit (HOSPITAL_BASED_OUTPATIENT_CLINIC_OR_DEPARTMENT_OTHER): Payer: Medicare Other

## 2014-10-31 ENCOUNTER — Ambulatory Visit (HOSPITAL_BASED_OUTPATIENT_CLINIC_OR_DEPARTMENT_OTHER): Payer: Medicare Other | Admitting: Physician Assistant

## 2014-10-31 VITALS — BP 112/75 | HR 102 | Temp 99.5°F | Resp 18 | Ht 67.0 in | Wt 269.0 lb

## 2014-10-31 DIAGNOSIS — C3490 Malignant neoplasm of unspecified part of unspecified bronchus or lung: Secondary | ICD-10-CM

## 2014-10-31 DIAGNOSIS — C3491 Malignant neoplasm of unspecified part of right bronchus or lung: Secondary | ICD-10-CM

## 2014-10-31 DIAGNOSIS — C7951 Secondary malignant neoplasm of bone: Secondary | ICD-10-CM

## 2014-10-31 DIAGNOSIS — J3489 Other specified disorders of nose and nasal sinuses: Secondary | ICD-10-CM | POA: Diagnosis not present

## 2014-10-31 DIAGNOSIS — G893 Neoplasm related pain (acute) (chronic): Secondary | ICD-10-CM | POA: Diagnosis not present

## 2014-10-31 LAB — COMPREHENSIVE METABOLIC PANEL (CC13)
ALT: 13 U/L (ref 0–55)
ANION GAP: 6 meq/L (ref 3–11)
AST: 17 U/L (ref 5–34)
Albumin: 3.5 g/dL (ref 3.5–5.0)
Alkaline Phosphatase: 85 U/L (ref 40–150)
BUN: 19.1 mg/dL (ref 7.0–26.0)
CALCIUM: 9.1 mg/dL (ref 8.4–10.4)
CHLORIDE: 108 meq/L (ref 98–109)
CO2: 28 meq/L (ref 22–29)
CREATININE: 0.9 mg/dL (ref 0.6–1.1)
EGFR: >90 mL/min/{1.73_m2} (ref >90)
Glucose: 94 mg/dl (ref 70–140)
POTASSIUM: 4.3 meq/L (ref 3.5–5.1)
Sodium: 141 mEq/L (ref 136–145)
Total Bilirubin: 0.34 mg/dL (ref 0.20–1.20)
Total Protein: 7.5 g/dL (ref 6.4–8.3)

## 2014-10-31 LAB — CBC WITH DIFFERENTIAL/PLATELET
BASO%: 1.1 % (ref 0.0–2.0)
Basophils Absolute: 0.1 10*3/uL (ref 0.0–0.1)
EOS%: 2.8 % (ref 0.0–7.0)
Eosinophils Absolute: 0.2 10*3/uL (ref 0.0–0.5)
HEMATOCRIT: 32.4 % — AB (ref 34.8–46.6)
HGB: 10.6 g/dL — ABNORMAL LOW (ref 11.6–15.9)
LYMPH%: 32 % (ref 14.0–49.7)
MCH: 29.9 pg (ref 25.1–34.0)
MCHC: 32.7 g/dL (ref 31.5–36.0)
MCV: 91.3 fL (ref 79.5–101.0)
MONO#: 0.4 10*3/uL (ref 0.1–0.9)
MONO%: 6.2 % (ref 0.0–14.0)
NEUT#: 4 10*3/uL (ref 1.5–6.5)
NEUT%: 57.9 % (ref 38.4–76.8)
PLATELETS: 263 10*3/uL (ref 145–400)
RBC: 3.55 10*6/uL — AB (ref 3.70–5.45)
RDW: 14.5 % (ref 11.2–14.5)
WBC: 6.9 10*3/uL (ref 3.9–10.3)
lymph#: 2.2 10*3/uL (ref 0.9–3.3)

## 2014-10-31 MED ORDER — DENOSUMAB 120 MG/1.7ML ~~LOC~~ SOLN
120.0000 mg | Freq: Once | SUBCUTANEOUS | Status: AC
  Administered 2014-10-31: 120 mg via SUBCUTANEOUS
  Filled 2014-10-31: qty 1.7

## 2014-10-31 NOTE — Progress Notes (Addendum)
[place] Telephone:([phone]) [phone]   Fax:([phone]  OFFICE PROGRESS NOTE  [name], MD [place] [address]  DIAGNOSIS AND STAGE: Metastatic non-small cell lung cancer, adenocarcinoma with positive EGFR mutation in exon 19 diagnosed in June of 2013   PRIOR THERAPY: palliative radiotherapy to the right lung under the care of Dr. Valere Dross expected to be completed on 10/29/2011.   CURRENT THERAPY:  1. [name] 150 mg by mouth daily started 10/11/2011. Status post 35 months of therapy 2. Xgeva 120 mg subcutaneously every 8 weeks.  CHEMOTHERAPY INTENT: Palliative  CURRENT # OF CHEMOTHERAPY CYCLES: 35 CURRENT ANTIEMETICS: Compazine  CURRENT SMOKING STATUS: Former smoker but quit.  ORAL CHEMOTHERAPY AND CONSENT: Oral [name]  CURRENT BISPHOSPHONATES USE: Xgeva  PAIN MANAGEMENT: well-controlled with oxycodone when necessary.  NARCOTICS INDUCED CONSTIPATION: None  LIVING WILL AND CODE STATUS: Full code.   INTERVAL HISTORY: Beth [name] 54 y.o. female returns to the clinic today for followup visit. The patient is feeling fine today with no specific complaints except for increased fatigue lately. She also notes running sore nose with some crusting. She did not obtain any relief by taking either Claritin or Benadryl. She has been dabbing the areas with witch hazel and Neosporin.  She denied having any significant chest pain, shortness of breath, cough or hemoptysis. She denied having any significant skin rash or diarrhea. The patient denied having any significant weight loss or night sweats. She is tolerating her treatment with [name] fairly well. She is here today for evaluation and repeat blood work.  MEDICAL HISTORY: Past Medical History  Diagnosis Date  . Asthma   . GERD (gastroesophageal reflux disease)   . Hypertension   . Heart murmur   . Fibromyalgia   . Arthritis of knee, degenerative   . Endocarditis     as teenager    . Complication of anesthesia     difficulty waking up  . Shortness of breath   . Lung mass     R- ADENOCARCINOMA  . Sleep apnea     no longer using CPAP  . Pleural effusion 08/30/11  . Anxiety   . Depression   . History of radiation therapy 09/29/11-11/04/2011    right lung 2700cGy 15 sessions  . Osseous metastasis 09/20/11    per PET scan    ALLERGIES:  is allergic to meloxicam.  MEDICATIONS:  Current Outpatient Prescriptions  Medication Sig Dispense Refill  . cloNIDine (CATAPRES) 0.1 MG tablet Take 1 tablet by mouth daily as needed for diastolic blood pressure over 100 10 tablet 0  . erlotinib ([name]) 150 MG tablet Take 1 tablet (150 mg total) by mouth daily. Take on an empty stomach 1 hour before meals or 2 hours after. 30 tablet 1  . fish oil-omega-3 fatty acids 1000 MG capsule Take 2 g by mouth daily.    Marland Kitchen guaiFENesin-codeine (ROBITUSSIN AC) 100-10 MG/5ML syrup Take 5 mLs by mouth 3 (three) times daily as needed for cough. 120 mL 0  . hydrochlorothiazide (HYDRODIURIL) 25 MG tablet TAKE 1 TABLET BY MOUTH DAILY 30 tablet 5  . Ipratropium-Albuterol (COMBIVENT) 20-100 MCG/ACT AERS respimat Inhale 1 puff into the lungs every 6 (six) hours as needed for wheezing. 1 Inhaler 2  . loperamide (IMODIUM) 2 MG capsule Take 2 mg by mouth 4 (four) times daily as needed for diarrhea or loose stools.    . mometasone (NASONEX) 50 MCG/ACT nasal spray Place 2 sprays into the nose  daily. 17 g 12  . Multiple Vitamins-Minerals (MULTIVITAMIN WITH MINERALS) tablet Take 1 tablet by mouth daily.    . Oxycodone HCl 10 MG TABS Take 1 tablet (10 mg total) by mouth every 6 (six) hours as needed. 90 tablet 0  . prochlorperazine (COMPAZINE) 10 MG tablet Take 1 tablet (10 mg total) by mouth every 6 (six) hours as needed. 30 tablet 1  . temazepam (RESTORIL) 15 MG capsule TAKE ONE CAPSULE AT BEDTIME AS NEEDED 30 capsule 0  . venlafaxine XR (EFFEXOR-XR) 150 MG 24 hr capsule TAKE ONE CAPSULE BY MOUTH EVERY DAY 30  capsule 0   No current facility-administered medications for this visit.   Facility-Administered Medications Ordered in Other Visits  Medication Dose Route Frequency Provider Last Rate Last Dose  . denosumab (XGEVA) injection 120 mg  120 mg Subcutaneous Once Curt Bears, MD        SURGICAL HISTORY:  Past Surgical History  Procedure Laterality Date  . Tubal ligation  2002  . Thoracentesis  09/02/11, 09/09/11    right-side pleural effusion    REVIEW OF SYSTEMS:  Constitutional: positive for fatigue Eyes: negative Ears, nose, mouth, throat, and face: positive for Runny nose and nasal crustiness Respiratory: positive for dyspnea on exertion Cardiovascular: negative Gastrointestinal: negative Genitourinary:negative Integument/breast: negative Hematologic/lymphatic: negative Musculoskeletal:negative Neurological: negative Behavioral/Psych: negative Endocrine: negative Allergic/Immunologic: negative   PHYSICAL EXAMINATION: General appearance: alert, cooperative and no distress Head: Normocephalic, without obvious abnormality, atraumatic Neck: no adenopathy, no JVD, supple, symmetrical, trachea midline and thyroid not enlarged, symmetric, no tenderness/mass/nodules Lymph nodes: Cervical, supraclavicular, and axillary nodes normal. Resp: diminished breath sounds RLL and dullness to percussion RLL Back: symmetric, no curvature. ROM normal. No CVA tenderness. Cardio: regular rate and rhythm, S1, S2 normal, no murmur, click, rub or gallop GI: soft, non-tender; bowel sounds normal; no masses,  no organomegaly Extremities: extremities normal, atraumatic, no cyanosis or edema. There is darkening of the skin and swelling of the right fourth toe. Neurologic: Alert and oriented X 3, normal strength and tone. Normal symmetric reflexes. Normal coordination and gait Nasal mucosa is dry without any discrete lesions  ECOG PERFORMANCE STATUS: 1 - Symptomatic but completely ambulatory  Blood  pressure 112/75, pulse 102, temperature 99.5 F (37.5 C), temperature source Oral, resp. rate 18, height '5\' 7"'  (1.702 m), weight 269 lb (122.018 kg), SpO2 98 %.  LABORATORY DATA: Lab Results  Component Value Date   WBC 6.9 10/31/2014   HGB 10.6* 10/31/2014   HCT 32.4* 10/31/2014   MCV 91.3 10/31/2014   PLT 263 10/31/2014      Chemistry      Component Value Date/Time   NA 141 10/31/2014 1501   NA 139 05/23/2012 1305   K 4.3 10/31/2014 1501   K 4.0 05/23/2012 1305   CL 104 08/18/2012 1154   CL 100 05/23/2012 1305   CO2 28 10/31/2014 1501   CO2 30 05/23/2012 1305   BUN 19.1 10/31/2014 1501   BUN 16 05/23/2012 1305   CREATININE 0.9 10/31/2014 1501   CREATININE 0.74 05/23/2012 1305      Component Value Date/Time   CALCIUM 9.1 10/31/2014 1501   CALCIUM 9.0 05/23/2012 1305   ALKPHOS 85 10/31/2014 1501   ALKPHOS 113 05/23/2012 1305   AST 17 10/31/2014 1501   AST 15 05/23/2012 1305   ALT 13 10/31/2014 1501   ALT 7 05/23/2012 1305   BILITOT 0.34 10/31/2014 1501   BILITOT 0.6 05/23/2012 1305       RADIOGRAPHIC  STUDIES:  ASSESSMENT AND PLAN: This is a very pleasant 54 years old [place] American female with:  1)  Metastatic non-small cell lung cancer, adenocarcinoma with positive EGFR mutation in exon 19 currently undergoing systemic treatment with oral [name] status post 35 months of treatment. The patient is tolerating her treatment fairly well with no significant adverse effects. Patient was discussed with and also seen by Dr. Julien Nordmann. She will continue on [name] at 150 mg by mouth daily. We'll plan to have her return in 2 months with a restaging CT scan of the chest, abdomen and pelvis to reevaluate her disease.   2) Metastatic bone disease:  the patient will continue on Xgeva 120 mg subcutaneously every 8 weeks.   3) For pain management, She will continue OxyCodone 10 mg by mouth every 6 hours as needed for pain   4) For nasal mucosa dryness, patient was advised to  use nasal saline spray continue the Neosporin as needed.  The patient voices understanding of current disease status and treatment options and is in agreement with the current care plan.  All questions were answered. The patient knows to call the clinic with any problems, questions or concerns. We can certainly see the patient much sooner if necessary.  Carlton Adam, PA-C 10/31/2014   ADDENDUM: Hematology/Oncology Attending: I had a face to face encounter with the patient. I recommended her care plan. This is a very pleasant 54 years old African-American female with metastatic non-small cell lung cancer, adenocarcinoma with positive EGFR mutation was currently and are going treatment with [name] 150 mg by mouth daily and tolerating her treatment fairly well. She has been on this treatment for almost 3 years now. She has only mild skin rash and occasional episodes of diarrhea. I recommended for the patient to continue her current treatment with [name] with the same dose. She will continue on Xgeva as previously scheduled every 8 weeks. The patient would come back for follow-up visit in 2 months for reevaluation after repeating CT scan of the chest, abdomen and pelvis as well as neck for restaging of her disease. She was advised to call immediately if she has any concerning symptoms in the interval.  Disclaimer: This note was dictated with voice recognition software. Similar sounding words can inadvertently be transcribed and may not be corrected upon review. Eilleen Kempf., MD 11/02/2014

## 2014-11-01 ENCOUNTER — Telehealth: Payer: Self-pay | Admitting: Internal Medicine

## 2014-11-01 NOTE — Patient Instructions (Signed)
Continue [name] 150 mg by mouth daily Follow up in 2 months with a restaging CT scan to re-evaluate your disease

## 2014-11-01 NOTE — Telephone Encounter (Signed)
Called and left a message with a new appointments

## 2014-11-04 ENCOUNTER — Other Ambulatory Visit: Payer: Self-pay | Admitting: Medical Oncology

## 2014-11-04 DIAGNOSIS — C3490 Malignant neoplasm of unspecified part of unspecified bronchus or lung: Secondary | ICD-10-CM

## 2014-11-04 MED ORDER — OXYCODONE HCL 10 MG PO TABS: 10.0000 mg | ORAL_TABLET | Freq: Four times a day (QID) | ORAL | Status: DC | PRN

## 2014-11-06 ENCOUNTER — Encounter: Payer: Self-pay | Admitting: Family Medicine

## 2014-11-06 ENCOUNTER — Ambulatory Visit (INDEPENDENT_AMBULATORY_CARE_PROVIDER_SITE_OTHER): Payer: Medicare Other | Admitting: Family Medicine

## 2014-11-06 VITALS — BP 120/80 | HR 97 | Temp 98.4°F | Wt 271.0 lb

## 2014-11-06 DIAGNOSIS — L0201 Cutaneous abscess of face: Secondary | ICD-10-CM | POA: Diagnosis not present

## 2014-11-06 DIAGNOSIS — R600 Localized edema: Secondary | ICD-10-CM

## 2014-11-06 DIAGNOSIS — L03211 Cellulitis of face: Secondary | ICD-10-CM | POA: Diagnosis not present

## 2014-11-06 MED ORDER — FUROSEMIDE 20 MG PO TABS: ORAL_TABLET | ORAL | Status: DC

## 2014-11-06 MED ORDER — DOXYCYCLINE HYCLATE 100 MG PO CAPS: 100.0000 mg | ORAL_CAPSULE | Freq: Two times a day (BID) | ORAL | Status: DC

## 2014-11-06 NOTE — Progress Notes (Signed)
Subjective:    Patient ID: Beth [name], female    DOB: [birth year], 54 y.o.   MRN: [id number]  HPI   acute visit. Patient seen with some pain and swelling above her left eye and eyebrow region past few days. She states last week she had her  Eyebrows "waxed ".   She denies any prior history of MRSA. She noticed small pustule in the eyebrow region. She subsequently developed some edema involving the upper mid. No visual changes. Denies any fevers or chills. No nausea or vomiting. No known drug allergies.   Second issue is she has some bilateral leg edema. She takes HCTZ at baseline. She is requesting stronger diuretic for as needed use. No dyspnea. No orthopnea. No history of CHF. Denies any dietary changes.  Past Medical History  Diagnosis Date  . Asthma   . GERD (gastroesophageal reflux disease)   . Hypertension   . Heart murmur   . Fibromyalgia   . Arthritis of knee, degenerative   . Endocarditis     as teenager  . Complication of anesthesia     difficulty waking up  . Shortness of breath   . Lung mass     R- ADENOCARCINOMA  . Sleep apnea     no longer using CPAP  . Pleural effusion 08/30/11  . Anxiety   . Depression   . History of radiation therapy 09/29/11-11/04/2011    right lung 2700cGy 15 sessions  . Osseous metastasis 09/20/11    per PET scan   Past Surgical History  Procedure Laterality Date  . Tubal ligation  2002  . Thoracentesis  09/02/11, 09/09/11    right-side pleural effusion    reports that she quit smoking about 3 years ago. Her smoking use included Cigarettes. She has a 7.5 pack-year smoking history. She does not have any smokeless tobacco history on file. She reports that she does not drink alcohol or use illicit drugs. family history includes Alcohol abuse in her father; Hypertension in her maternal aunt, maternal grandfather, maternal grandmother, maternal uncle, and mother; Sarcoidosis in her sister. Allergies  Allergen Reactions  . Meloxicam    Possible GI bleed     Review of Systems  Constitutional: Negative for fever and chills.  Eyes: Negative for visual disturbance.  Respiratory: Negative for cough and shortness of breath.   Cardiovascular: Positive for leg swelling. Negative for chest pain.  Gastrointestinal: Negative for nausea and vomiting.  Neurological: Positive for headaches. Negative for dizziness.       Objective:   Physical Exam  Constitutional: She appears well-developed and well-nourished.  HENT:  She has some diffuse swelling and mild erythema involving the left eyebrow region. She has a couple small pustules over that region. She has some mild edema involving the left upper eyelid  Neck: Neck supple.  Cardiovascular: Normal rate and regular rhythm.   Pulmonary/Chest: Effort normal and breath sounds normal. No respiratory distress. She has no wheezes. She has no rales.  Musculoskeletal: She exhibits edema.  Mild edema bilaterally feet and lower legs          Assessment & Plan:   #1 small abscess and cellulitis involving the left eyebrow region. She does not have evidence to suggest conjunctivitis. We used #25-gauge needle and unroofed with a small pustule and cultured this. Start doxycycline 100 mg twice daily for 10 days. Warm compresses several times daily. She did not have significant underlying fluctuance to indicate need for incision and drainage at this time. We  caution about risk of periorbital cellulitis. Recheck in 2 days and sooner for any worsening symptoms #2 leg edema. Recommend only when necessary use of furosemide 20 mg once daily for severe edema. Supplement with high potassium foods when taking

## 2014-11-06 NOTE — Progress Notes (Signed)
Pre visit review using our clinic review tool, if applicable. No additional management support is needed unless otherwise documented below in the visit note. 

## 2014-11-06 NOTE — Patient Instructions (Signed)
Use warm compresses several times daily Follow up promptly for any fever or increased redness or swelling Start the antibiotic as soon as possible.

## 2014-11-08 ENCOUNTER — Ambulatory Visit: Payer: Medicare Other | Admitting: Family Medicine

## 2014-11-08 ENCOUNTER — Telehealth: Payer: Self-pay | Admitting: Family Medicine

## 2014-11-08 NOTE — Telephone Encounter (Signed)
Wound culture moderate Staphylococcus aureus

## 2014-11-08 NOTE — Telephone Encounter (Signed)
Pt informed

## 2014-11-08 NOTE — Telephone Encounter (Signed)
Pt cancelled acute appt to see PCP today for eye infection, states she will just wait to see pcp at her cpx appt on next Wednesday, 8/24.  However, pt is requesting the results of lab work.  I informed pt to review labs on mychart but she states her computer is not working at this time.

## 2014-11-09 LAB — WOUND CULTURE
GRAM STAIN: NONE SEEN
Gram Stain: NONE SEEN
Gram Stain: NONE SEEN

## 2014-11-12 ENCOUNTER — Telehealth: Payer: Self-pay | Admitting: Internal Medicine

## 2014-11-12 ENCOUNTER — Telehealth: Payer: Self-pay | Admitting: *Deleted

## 2014-11-12 NOTE — Telephone Encounter (Signed)
Left message to confirm appointment change from 09/08 to 10/06 per pof

## 2014-11-12 NOTE — Telephone Encounter (Signed)
Pt called regarding upcoming injection appt.   Reviewed with Pharmacy and per MD last progress note pt to receive Xgeva every 8 weeks.  POF sent- injection appt  10/6  Previous POF (8/11) order for CT scan, waiting on appt.  Pt next f/u 10/17

## 2014-11-13 ENCOUNTER — Encounter: Payer: Self-pay | Admitting: Family Medicine

## 2014-11-13 ENCOUNTER — Ambulatory Visit (INDEPENDENT_AMBULATORY_CARE_PROVIDER_SITE_OTHER): Payer: Medicare Other | Admitting: Family Medicine

## 2014-11-13 VITALS — BP 130/90 | HR 100 | Temp 98.4°F | Ht 67.0 in | Wt 269.0 lb

## 2014-11-13 DIAGNOSIS — Z8614 Personal history of Methicillin resistant Staphylococcus aureus infection: Secondary | ICD-10-CM

## 2014-11-13 DIAGNOSIS — R5383 Other fatigue: Secondary | ICD-10-CM

## 2014-11-13 DIAGNOSIS — C3490 Malignant neoplasm of unspecified part of unspecified bronchus or lung: Secondary | ICD-10-CM

## 2014-11-13 DIAGNOSIS — Z23 Encounter for immunization: Secondary | ICD-10-CM

## 2014-11-13 DIAGNOSIS — Z9189 Other specified personal risk factors, not elsewhere classified: Secondary | ICD-10-CM

## 2014-11-13 DIAGNOSIS — E785 Hyperlipidemia, unspecified: Secondary | ICD-10-CM

## 2014-11-13 DIAGNOSIS — Z87898 Personal history of other specified conditions: Secondary | ICD-10-CM

## 2014-11-13 DIAGNOSIS — Z0000 Encounter for general adult medical examination without abnormal findings: Secondary | ICD-10-CM | POA: Diagnosis not present

## 2014-11-13 LAB — LIPID PANEL
CHOL/HDL RATIO: 3
CHOLESTEROL: 212 mg/dL — AB (ref 0–200)
HDL: 81.6 mg/dL (ref >39.00)
LDL CALC: 120 mg/dL — AB (ref 0–99)
NonHDL: 130.02
Triglycerides: 49 mg/dL (ref 0.0–149.0)
VLDL: 9.8 mg/dL (ref 0.0–40.0)

## 2014-11-13 LAB — VITAMIN D 25 HYDROXY (VIT D DEFICIENCY, FRACTURES): VITD: 18.4 ng/mL — AB (ref 30.00–100.00)

## 2014-11-13 LAB — TSH: TSH: 1.13 u[IU]/mL (ref 0.35–4.50)

## 2014-11-13 MED ORDER — MUPIROCIN 2 % EX OINT: 1.0000 "application " | TOPICAL_OINTMENT | Freq: Two times a day (BID) | CUTANEOUS | Status: DC

## 2014-11-13 MED ORDER — PROCHLORPERAZINE MALEATE 10 MG PO TABS: 10.0000 mg | ORAL_TABLET | Freq: Four times a day (QID) | ORAL | Status: DC | PRN

## 2014-11-13 NOTE — Patient Instructions (Signed)
Schedule repeat mammogram We will call you regarding colonoscopy Use Bactroban ointment intranasal twice daily for 5 days Consider over-the-counter Zantac or Pepcid for heartburn symptoms       Food Choices for Gastroesophageal Reflux Disease When you have gastroesophageal reflux disease (GERD), the foods you eat and your eating habits are very important. Choosing the right foods can help ease the discomfort of GERD. WHAT GENERAL GUIDELINES DO I NEED TO FOLLOW?  Choose fruits, vegetables, whole grains, low-fat dairy products, and low-fat meat, fish, and poultry.  Limit fats such as oils, salad dressings, butter, nuts, and avocado.  Keep a food diary to identify foods that cause symptoms.  Avoid foods that cause reflux. These may be different for different people.  Eat frequent small meals instead of three large meals each day.  Eat your meals slowly, in a relaxed setting.  Limit fried foods.  Cook foods using methods other than frying.  Avoid drinking alcohol.  Avoid drinking large amounts of liquids with your meals.  Avoid bending over or lying down until 2-3 hours after eating. WHAT FOODS ARE NOT RECOMMENDED? The following are some foods and drinks that may worsen your symptoms: Vegetables Tomatoes. Tomato juice. Tomato and spaghetti sauce. Chili peppers. Onion and garlic. Horseradish. Fruits Oranges, grapefruit, and lemon (fruit and juice). Meats High-fat meats, fish, and poultry. This includes hot dogs, ribs, ham, sausage, salami, and bacon. Dairy Whole milk and chocolate milk. Sour cream. Cream. Butter. Ice cream. Cream cheese.  Beverages Coffee and tea, with or without caffeine. Carbonated beverages or energy drinks. Condiments Hot sauce. Barbecue sauce.  Sweets/Desserts Chocolate and cocoa. Donuts. Peppermint and spearmint. Fats and Oils High-fat foods, including [place] fries and potato chips. Other Vinegar. Strong spices, such as black pepper, white  pepper, red pepper, cayenne, curry powder, cloves, ginger, and chili powder. The items listed above may not be a complete list of foods and beverages to avoid. Contact your dietitian for more information. Document Released: 03/08/2005 Document Revised: 03/13/2013 Document Reviewed: 01/10/2013 [facility] Patient Information 2015 [place], [place]. This information is not intended to replace advice given to you by your health care provider. Make sure you discuss any questions you have with your health care provider.

## 2014-11-13 NOTE — Progress Notes (Signed)
Pre visit review using our clinic review tool, if applicable. No additional management support is needed unless otherwise documented below in the visit note. 

## 2014-11-13 NOTE — Progress Notes (Signed)
Subjective:    Patient ID: Beth [name], female    DOB: 06/27/1960, 54 y.o.   MRN: [id number]  HPI Patient here for complete physical. She continues to see gynecologist yearly and had recent Pap smear. She is also due for repeat mammogram and she plans to set this up. She has never had colonoscopy. She is a former smoker and quit 2012.  Chronic medical problems include obesity, hypertension, GERD, history of depression, history of vitamin D deficiency, chronic normocytic anemia, stage IV adenocarcinoma lung. She had recent MRSA from abscess left eyebrow region. This was draining spontaneously and cleared up promptly with doxycycline. No prior history of MRSA. She's had some recent issues with GERD. She's not tried any antacids yet. No dysphagia. No appetite or weight changes.  Past Medical History  Diagnosis Date  . Asthma   . GERD (gastroesophageal reflux disease)   . Hypertension   . Heart murmur   . Fibromyalgia   . Arthritis of knee, degenerative   . Endocarditis     as teenager  . Complication of anesthesia     difficulty waking up  . Shortness of breath   . Lung mass     R- ADENOCARCINOMA  . Sleep apnea     no longer using CPAP  . Pleural effusion 08/30/11  . Anxiety   . Depression   . History of radiation therapy 09/29/11-11/04/2011    right lung 2700cGy 15 sessions  . Osseous metastasis 09/20/11    per PET scan   Past Surgical History  Procedure Laterality Date  . Tubal ligation  2002  . Thoracentesis  09/02/11, 09/09/11    right-side pleural effusion    reports that she quit smoking about 3 years ago. Her smoking use included Cigarettes. She has a 7.5 pack-year smoking history. She does not have any smokeless tobacco history on file. She reports that she does not drink alcohol or use illicit drugs. family history includes Alcohol abuse in her father; Hypertension in her maternal aunt, maternal grandfather, maternal grandmother, maternal uncle, and mother; Sarcoidosis  in her sister. Allergies  Allergen Reactions  . Meloxicam     Possible GI bleed        Review of Systems  Constitutional: Negative for fever, chills, activity change, appetite change, fatigue and unexpected weight change.  HENT: Negative for ear pain, hearing loss, sore throat and trouble swallowing.   Eyes: Negative for visual disturbance.  Respiratory: Negative for cough and shortness of breath.   Cardiovascular: Negative for chest pain and palpitations.  Gastrointestinal: Negative for abdominal pain, diarrhea, constipation and blood in stool.       Does have occasional substernal burning. No dysphagia  Genitourinary: Negative for dysuria and hematuria.  Musculoskeletal: Negative for myalgias, back pain and arthralgias.  Skin: Negative for rash.  Neurological: Negative for dizziness, syncope and headaches.  Hematological: Negative for adenopathy.  Psychiatric/Behavioral: Negative for confusion and dysphoric mood.       Objective:   Physical Exam  Constitutional: She is oriented to person, place, and time. She appears well-developed and well-nourished.  HENT:  Head: Normocephalic and atraumatic.  Left ear cerumen impaction  Eyes: EOM are normal. Pupils are equal, round, and reactive to light.  Neck: Normal range of motion. Neck supple. No thyromegaly present.  Cardiovascular: Normal rate, regular rhythm and normal heart sounds.   No murmur heard. Pulmonary/Chest: Breath sounds normal. No respiratory distress. She has no wheezes. She has no rales.  Abdominal: Soft. Bowel sounds are  normal. She exhibits no distension and no mass. There is no tenderness. There is no rebound and no guarding.  Musculoskeletal: Normal range of motion. She exhibits no edema.  Lymphadenopathy:    She has no cervical adenopathy.  Neurological: She is alert and oriented to person, place, and time. She displays normal reflexes. No cranial nerve deficit.  Skin: No rash noted.  Psychiatric: She has a  normal mood and affect. Her behavior is normal. Judgment and thought content normal.          Assessment & Plan:   #1 health maintenance. She is encouraged to lose weight. She had recent CBC and comprehensive metabolic panel per oncology. Check lipid, TSH, vitamin D level. She will continue GYN follow-up. She's never had screening colonoscopy. She does have lung cancer and this has been very stable for quite some time. We discussed pros and cons of colonoscopy and she would like to proceed. #2 history of impaction left ear. Irrigated with success #3 history of recent MRSA. Patient inquiring about intranasal treatment with Bactroban twice a day for 5 days. Prescription written. Follow-up promptly for any recurrent abscess or other concerns

## 2014-11-14 ENCOUNTER — Other Ambulatory Visit: Payer: Self-pay

## 2014-11-14 ENCOUNTER — Other Ambulatory Visit: Payer: Self-pay | Admitting: Family Medicine

## 2014-11-14 ENCOUNTER — Encounter: Payer: Self-pay | Admitting: Gastroenterology

## 2014-11-14 DIAGNOSIS — E559 Vitamin D deficiency, unspecified: Secondary | ICD-10-CM

## 2014-11-14 MED ORDER — ERGOCALCIFEROL 1.25 MG (50000 UT) PO CAPS: 50000.0000 [IU] | ORAL_CAPSULE | ORAL | Status: DC

## 2014-11-21 ENCOUNTER — Telehealth: Payer: Self-pay | Admitting: Medical Oncology

## 2014-11-21 ENCOUNTER — Telehealth: Payer: Self-pay | Admitting: Internal Medicine

## 2014-11-21 DIAGNOSIS — C3490 Malignant neoplasm of unspecified part of unspecified bronchus or lung: Secondary | ICD-10-CM

## 2014-11-21 NOTE — Telephone Encounter (Signed)
Called patient and left a message with new appointments

## 2014-11-21 NOTE — Telephone Encounter (Signed)
Labs moved to sept, injection to after MD visit in oct

## 2014-11-28 ENCOUNTER — Ambulatory Visit: Payer: Medicare Other

## 2014-11-28 ENCOUNTER — Other Ambulatory Visit: Payer: Medicare Other

## 2014-11-29 ENCOUNTER — Telehealth: Payer: Self-pay | Admitting: Internal Medicine

## 2014-11-29 NOTE — Telephone Encounter (Signed)
returned call and s.w. pt and confirmed appt d.tk. change....pt ok and aware

## 2014-12-03 ENCOUNTER — Other Ambulatory Visit: Payer: Self-pay | Admitting: Medical Oncology

## 2014-12-03 ENCOUNTER — Other Ambulatory Visit (HOSPITAL_BASED_OUTPATIENT_CLINIC_OR_DEPARTMENT_OTHER): Payer: Medicare Other

## 2014-12-03 DIAGNOSIS — C3490 Malignant neoplasm of unspecified part of unspecified bronchus or lung: Secondary | ICD-10-CM

## 2014-12-03 LAB — COMPREHENSIVE METABOLIC PANEL (CC13)
ALBUMIN: 3.6 g/dL (ref 3.5–5.0)
ALK PHOS: 97 U/L (ref 40–150)
ALT: 16 U/L (ref 0–55)
AST: 23 U/L (ref 5–34)
Anion Gap: 9 mEq/L (ref 3–11)
BILIRUBIN TOTAL: 0.55 mg/dL (ref 0.20–1.20)
BUN: 16 mg/dL (ref 7.0–26.0)
CO2: 24 mEq/L (ref 22–29)
Calcium: 9 mg/dL (ref 8.4–10.4)
Chloride: 109 mEq/L (ref 98–109)
Creatinine: 0.8 mg/dL (ref 0.6–1.1)
GLUCOSE: 75 mg/dL (ref 70–140)
Potassium: 4.2 mEq/L (ref 3.5–5.1)
SODIUM: 142 meq/L (ref 136–145)
TOTAL PROTEIN: 7.8 g/dL (ref 6.4–8.3)

## 2014-12-03 LAB — CBC WITH DIFFERENTIAL/PLATELET
BASO%: 0.8 % (ref 0.0–2.0)
Basophils Absolute: 0.1 10*3/uL (ref 0.0–0.1)
EOS%: 2.6 % (ref 0.0–7.0)
Eosinophils Absolute: 0.2 10*3/uL (ref 0.0–0.5)
HEMATOCRIT: 34.1 % — AB (ref 34.8–46.6)
HEMOGLOBIN: 11.2 g/dL — AB (ref 11.6–15.9)
LYMPH#: 1.9 10*3/uL (ref 0.9–3.3)
LYMPH%: 28 % (ref 14.0–49.7)
MCH: 29.6 pg (ref 25.1–34.0)
MCHC: 32.9 g/dL (ref 31.5–36.0)
MCV: 90.1 fL (ref 79.5–101.0)
MONO#: 0.5 10*3/uL (ref 0.1–0.9)
MONO%: 7 % (ref 0.0–14.0)
NEUT%: 61.6 % (ref 38.4–76.8)
NEUTROS ABS: 4.1 10*3/uL (ref 1.5–6.5)
Platelets: 313 10*3/uL (ref 145–400)
RBC: 3.79 10*6/uL (ref 3.70–5.45)
RDW: 15.2 % — AB (ref 11.2–14.5)
WBC: 6.6 10*3/uL (ref 3.9–10.3)

## 2014-12-03 MED ORDER — OXYCODONE HCL 10 MG PO TABS: 10.0000 mg | ORAL_TABLET | Freq: Four times a day (QID) | ORAL | Status: DC | PRN

## 2014-12-03 NOTE — Progress Notes (Signed)
rx ready for pickup 

## 2014-12-04 ENCOUNTER — Other Ambulatory Visit: Payer: Medicare Other

## 2014-12-10 ENCOUNTER — Other Ambulatory Visit: Payer: Self-pay | Admitting: Medical Oncology

## 2014-12-10 DIAGNOSIS — C3490 Malignant neoplasm of unspecified part of unspecified bronchus or lung: Secondary | ICD-10-CM

## 2014-12-10 MED ORDER — ERLOTINIB HCL 150 MG PO TABS: 150.0000 mg | ORAL_TABLET | Freq: Every day | ORAL | Status: DC

## 2014-12-10 NOTE — Progress Notes (Signed)
tarceva sent to [facility] e-scribed.

## 2014-12-17 ENCOUNTER — Other Ambulatory Visit: Payer: Self-pay | Admitting: *Deleted

## 2014-12-17 DIAGNOSIS — C3491 Malignant neoplasm of unspecified part of right bronchus or lung: Secondary | ICD-10-CM

## 2014-12-17 DIAGNOSIS — C3410 Malignant neoplasm of upper lobe, unspecified bronchus or lung: Secondary | ICD-10-CM

## 2014-12-17 MED ORDER — TEMAZEPAM 15 MG PO CAPS: ORAL_CAPSULE | ORAL | Status: DC

## 2014-12-26 ENCOUNTER — Ambulatory Visit: Payer: Medicare Other

## 2014-12-26 ENCOUNTER — Other Ambulatory Visit: Payer: Self-pay | Admitting: Medical Oncology

## 2014-12-26 ENCOUNTER — Other Ambulatory Visit: Payer: Medicare Other

## 2014-12-26 DIAGNOSIS — C3490 Malignant neoplasm of unspecified part of unspecified bronchus or lung: Secondary | ICD-10-CM

## 2014-12-26 MED ORDER — OXYCODONE HCL 10 MG PO TABS
10.0000 mg | ORAL_TABLET | Freq: Four times a day (QID) | ORAL | Status: DC | PRN
Start: 2014-12-26 — End: 2015-01-23

## 2014-12-26 NOTE — Progress Notes (Signed)
rx locked and ready for pick up -pt notified.

## 2015-01-02 ENCOUNTER — Telehealth: Payer: Self-pay | Admitting: Internal Medicine

## 2015-01-02 NOTE — Telephone Encounter (Signed)
returned call and lvm for pt regarding to r/s appt...advixsed pt to call back if she wants to sched appt on 11.8

## 2015-01-03 ENCOUNTER — Encounter: Payer: Self-pay | Admitting: Medical Oncology

## 2015-01-03 ENCOUNTER — Telehealth: Payer: Self-pay | Admitting: Internal Medicine

## 2015-01-03 ENCOUNTER — Ambulatory Visit (HOSPITAL_COMMUNITY): Payer: Medicare Other

## 2015-01-03 NOTE — Telephone Encounter (Signed)
returned call and s.w. pt and confirmed appt....pt ok and aware °

## 2015-01-06 ENCOUNTER — Ambulatory Visit (HOSPITAL_BASED_OUTPATIENT_CLINIC_OR_DEPARTMENT_OTHER): Payer: Medicare Other | Admitting: Internal Medicine

## 2015-01-06 ENCOUNTER — Encounter: Payer: Self-pay | Admitting: Internal Medicine

## 2015-01-06 ENCOUNTER — Ambulatory Visit (HOSPITAL_COMMUNITY)
Admission: RE | Admit: 2015-01-06 | Discharge: 2015-01-06 | Disposition: A | Discharge status: Home / Self Care | Payer: Medicare Other | Source: Ambulatory Visit | Attending: Physician Assistant | Admitting: Physician Assistant

## 2015-01-06 ENCOUNTER — Ambulatory Visit (HOSPITAL_BASED_OUTPATIENT_CLINIC_OR_DEPARTMENT_OTHER): Payer: Medicare Other

## 2015-01-06 ENCOUNTER — Other Ambulatory Visit (HOSPITAL_BASED_OUTPATIENT_CLINIC_OR_DEPARTMENT_OTHER): Payer: Medicare Other

## 2015-01-06 ENCOUNTER — Encounter (HOSPITAL_COMMUNITY): Payer: Self-pay

## 2015-01-06 ENCOUNTER — Ambulatory Visit: Payer: Medicare Other

## 2015-01-06 VITALS — BP 139/99 | HR 97 | Temp 98.4°F | Resp 20 | Ht 67.0 in | Wt 272.7 lb

## 2015-01-06 DIAGNOSIS — C7951 Secondary malignant neoplasm of bone: Secondary | ICD-10-CM

## 2015-01-06 DIAGNOSIS — K469 Unspecified abdominal hernia without obstruction or gangrene: Secondary | ICD-10-CM | POA: Insufficient documentation

## 2015-01-06 DIAGNOSIS — Z08 Encounter for follow-up examination after completed treatment for malignant neoplasm: Secondary | ICD-10-CM | POA: Diagnosis not present

## 2015-01-06 DIAGNOSIS — G893 Neoplasm related pain (acute) (chronic): Secondary | ICD-10-CM | POA: Diagnosis not present

## 2015-01-06 DIAGNOSIS — K760 Fatty (change of) liver, not elsewhere classified: Secondary | ICD-10-CM | POA: Diagnosis not present

## 2015-01-06 DIAGNOSIS — C3490 Malignant neoplasm of unspecified part of unspecified bronchus or lung: Secondary | ICD-10-CM

## 2015-01-06 DIAGNOSIS — J941 Fibrothorax: Secondary | ICD-10-CM | POA: Diagnosis not present

## 2015-01-06 DIAGNOSIS — E663 Overweight: Secondary | ICD-10-CM

## 2015-01-06 DIAGNOSIS — C3491 Malignant neoplasm of unspecified part of right bronchus or lung: Secondary | ICD-10-CM

## 2015-01-06 DIAGNOSIS — Z5111 Encounter for antineoplastic chemotherapy: Secondary | ICD-10-CM | POA: Insufficient documentation

## 2015-01-06 LAB — COMPREHENSIVE METABOLIC PANEL (CC13)
ALT: 14 U/L (ref 0–55)
AST: 20 U/L (ref 5–34)
Albumin: 3.8 g/dL (ref 3.5–5.0)
Alkaline Phosphatase: 90 U/L (ref 40–150)
Anion Gap: 8 mEq/L (ref 3–11)
BUN: 15.3 mg/dL (ref 7.0–26.0)
CALCIUM: 8.8 mg/dL (ref 8.4–10.4)
CHLORIDE: 105 meq/L (ref 98–109)
CO2: 29 mEq/L (ref 22–29)
CREATININE: 0.8 mg/dL (ref 0.6–1.1)
EGFR: >90 mL/min/{1.73_m2} (ref >90)
GLUCOSE: 87 mg/dL (ref 70–140)
Potassium: 4.5 mEq/L (ref 3.5–5.1)
SODIUM: 143 meq/L (ref 136–145)
Total Bilirubin: 0.49 mg/dL (ref 0.20–1.20)
Total Protein: 7.8 g/dL (ref 6.4–8.3)

## 2015-01-06 LAB — CBC WITH DIFFERENTIAL/PLATELET
BASO%: 1 % (ref 0.0–2.0)
Basophils Absolute: 0.1 10*3/uL (ref 0.0–0.1)
EOS%: 1.7 % (ref 0.0–7.0)
Eosinophils Absolute: 0.1 10*3/uL (ref 0.0–0.5)
HEMATOCRIT: 35.7 % (ref 34.8–46.6)
HEMOGLOBIN: 11.6 g/dL (ref 11.6–15.9)
LYMPH#: 1.9 10*3/uL (ref 0.9–3.3)
LYMPH%: 29.8 % (ref 14.0–49.7)
MCH: 29.2 pg (ref 25.1–34.0)
MCHC: 32.5 g/dL (ref 31.5–36.0)
MCV: 89.7 fL (ref 79.5–101.0)
MONO#: 0.4 10*3/uL (ref 0.1–0.9)
MONO%: 6.8 % (ref 0.0–14.0)
NEUT#: 3.9 10*3/uL (ref 1.5–6.5)
NEUT%: 60.7 % (ref 38.4–76.8)
Platelets: 264 10*3/uL (ref 145–400)
RBC: 3.98 10*6/uL (ref 3.70–5.45)
RDW: 17.1 % — AB (ref 11.2–14.5)
WBC: 6.5 10*3/uL (ref 3.9–10.3)

## 2015-01-06 MED ORDER — IOHEXOL 300 MG/ML  SOLN
100.0000 mL | Freq: Once | INTRAMUSCULAR | Status: AC | PRN
  Administered 2015-01-06: 100 mL via INTRAVENOUS

## 2015-01-06 MED ORDER — DENOSUMAB 120 MG/1.7ML ~~LOC~~ SOLN
120.0000 mg | Freq: Once | SUBCUTANEOUS | Status: AC
  Administered 2015-01-06: 120 mg via SUBCUTANEOUS
  Filled 2015-01-06: qty 1.7

## 2015-01-06 NOTE — Progress Notes (Signed)
[place] Telephone:([phone]) [phone]   Fax:(336) [phone]  OFFICE PROGRESS NOTE  [name], MD [place] [address]  DIAGNOSIS AND STAGE: Metastatic non-small cell lung cancer, adenocarcinoma with positive EGFR mutation in exon 19 diagnosed in June of 2013   PRIOR THERAPY: palliative radiotherapy to the right lung under the care of Dr. Valere Dross expected to be completed on 10/29/2011.   CURRENT THERAPY:  1. [name] 150 mg by mouth daily started 10/11/2011. Status post 36 months of therapy 2. Xgeva 120 mg subcutaneously every 8 weeks.  CHEMOTHERAPY INTENT: Palliative  CURRENT # OF CHEMOTHERAPY CYCLES: 37 CURRENT ANTIEMETICS: Compazine  CURRENT SMOKING STATUS: Former smoker but quit.  ORAL CHEMOTHERAPY AND CONSENT: Oral [name]  CURRENT BISPHOSPHONATES USE: Xgeva  PAIN MANAGEMENT: well-controlled with oxycodone when necessary.  NARCOTICS INDUCED CONSTIPATION: None  LIVING WILL AND CODE STATUS: Full code.   INTERVAL HISTORY: Beth [name] 54 y.o. female returns to the clinic today for two-month followup visit. The patient is feeling fine today with no specific complaints. She had flu shot by her primary care physician. She denied having any significant chest pain, shortness of breath, cough or hemoptysis. She denied having any significant skin rash or diarrhea. The patient denied having any significant weight loss or night sweats. She is tolerating her treatment with [name] fairly well. The patient had repeat CT scan of the chest, abdomen and pelvis performed earlier today and she is here for evaluation and discussion of her scan results.  MEDICAL HISTORY: Past Medical History  Diagnosis Date  . Asthma   . GERD (gastroesophageal reflux disease)   . Hypertension   . Heart murmur   . Fibromyalgia   . Arthritis of knee, degenerative   . Endocarditis     as teenager  . Complication of anesthesia     difficulty waking up  .  Shortness of breath   . Lung mass     R- ADENOCARCINOMA  . Sleep apnea     no longer using CPAP  . Pleural effusion 08/30/11  . Anxiety   . Depression   . History of radiation therapy 09/29/11-11/04/2011    right lung 2700cGy 15 sessions  . Osseous metastasis ([place]) 09/20/11    per PET scan    ALLERGIES:  is allergic to meloxicam.  MEDICATIONS:  Current Outpatient Prescriptions  Medication Sig Dispense Refill  . ergocalciferol (VITAMIN D2) 50000 UNITS capsule Take 1 capsule (50,000 Units total) by mouth once a week. 12 capsule 1  . erlotinib ([name]) 150 MG tablet Take 1 tablet (150 mg total) by mouth daily. Take on an empty stomach 1 hour before meals or 2 hours after. 30 tablet 1  . fish oil-omega-3 fatty acids 1000 MG capsule Take 2 g by mouth daily.    . furosemide (LASIX) 20 MG tablet Take one daily as needed for severe edema. 30 tablet 1  . guaiFENesin-codeine (ROBITUSSIN AC) 100-10 MG/5ML syrup Take 5 mLs by mouth 3 (three) times daily as needed for cough. 120 mL 0  . hydrochlorothiazide (HYDRODIURIL) 25 MG tablet TAKE 1 TABLET BY MOUTH DAILY 30 tablet 5  . Ipratropium-Albuterol (COMBIVENT) 20-100 MCG/ACT AERS respimat Inhale 1 puff into the lungs every 6 (six) hours as needed for wheezing. 1 Inhaler 2  . loperamide (IMODIUM) 2 MG capsule Take 2 mg by mouth 4 (four) times daily as needed for diarrhea or loose stools.    . mometasone (NASONEX) 50 MCG/ACT nasal spray Place  2 sprays into the nose daily. 17 g 12  . Multiple Vitamins-Minerals (MULTIVITAMIN WITH MINERALS) tablet Take 1 tablet by mouth daily.    . mupirocin ointment (BACTROBAN) 2 % Place 1 application into the nose 2 (two) times daily. 22 g 0  . Oxycodone HCl 10 MG TABS Take 1 tablet (10 mg total) by mouth every 6 (six) hours as needed. 90 tablet 0  . prochlorperazine (COMPAZINE) 10 MG tablet Take 1 tablet (10 mg total) by mouth every 6 (six) hours as needed. 30 tablet 1  . temazepam (RESTORIL) 15 MG capsule TAKE ONE  CAPSULE AT BEDTIME AS NEEDED 30 capsule 0  . venlafaxine XR (EFFEXOR-XR) 150 MG 24 hr capsule TAKE ONE CAPSULE BY MOUTH EVERY DAY 30 capsule 0   No current facility-administered medications for this visit.   Facility-Administered Medications Ordered in Other Visits  Medication Dose Route Frequency Provider Last Rate Last Dose  . denosumab (XGEVA) injection 120 mg  120 mg Subcutaneous Once Curt Bears, MD        SURGICAL HISTORY:  Past Surgical History  Procedure Laterality Date  . Tubal ligation  2002  . Thoracentesis  09/02/11, 09/09/11    right-side pleural effusion    REVIEW OF SYSTEMS:  Constitutional: negative Eyes: negative Ears, nose, mouth, throat, and face: negative Respiratory: positive for dyspnea on exertion Cardiovascular: negative Gastrointestinal: negative Genitourinary:negative Integument/breast: negative Hematologic/lymphatic: negative Musculoskeletal:negative Neurological: negative Behavioral/Psych: negative Endocrine: negative Allergic/Immunologic: negative   PHYSICAL EXAMINATION: General appearance: alert, cooperative and no distress Head: Normocephalic, without obvious abnormality, atraumatic Neck: no adenopathy, no JVD, supple, symmetrical, trachea midline and thyroid not enlarged, symmetric, no tenderness/mass/nodules Lymph nodes: Cervical, supraclavicular, and axillary nodes normal. Resp: diminished breath sounds RLL and dullness to percussion RLL Back: symmetric, no curvature. ROM normal. No CVA tenderness. Cardio: regular rate and rhythm, S1, S2 normal, no murmur, click, rub or gallop GI: soft, non-tender; bowel sounds normal; no masses,  no organomegaly Extremities: extremities normal, atraumatic, no cyanosis or edema. There is darkening of the skin and swelling of the right fourth toe. Neurologic: Alert and oriented X 3, normal strength and tone. Normal symmetric reflexes. Normal coordination and gait  ECOG PERFORMANCE STATUS: 1 - Symptomatic  but completely ambulatory  There were no vitals taken for this visit.  LABORATORY DATA: Lab Results  Component Value Date   WBC 6.5 01/06/2015   HGB 11.6 01/06/2015   HCT 35.7 01/06/2015   MCV 89.7 01/06/2015   PLT 264 01/06/2015      Chemistry      Component Value Date/Time   NA 143 01/06/2015 1339   NA 139 05/23/2012 1305   K 4.5 01/06/2015 1339   K 4.0 05/23/2012 1305   CL 104 08/18/2012 1154   CL 100 05/23/2012 1305   CO2 29 01/06/2015 1339   CO2 30 05/23/2012 1305   BUN 15.3 01/06/2015 1339   BUN 16 05/23/2012 1305   CREATININE 0.8 01/06/2015 1339   CREATININE 0.74 05/23/2012 1305      Component Value Date/Time   CALCIUM 8.8 01/06/2015 1339   CALCIUM 9.0 05/23/2012 1305   ALKPHOS 90 01/06/2015 1339   ALKPHOS 113 05/23/2012 1305   AST 20 01/06/2015 1339   AST 15 05/23/2012 1305   ALT 14 01/06/2015 1339   ALT 7 05/23/2012 1305   BILITOT 0.49 01/06/2015 1339   BILITOT 0.6 05/23/2012 1305       RADIOGRAPHIC STUDIES: Ct Chest W Contrast  01/06/2015  CLINICAL DATA:  Lung  cancer, radiation therapy complete. [name] and progress. Shortness of breath on exertion. Nausea and diarrhea. EXAM: CT CHEST, ABDOMEN AND PELVIS WITHOUT CONTRAST TECHNIQUE: Multidetector CT imaging of the chest, abdomen and pelvis was performed following the standard protocol without IV contrast. COMPARISON:  09/12/2014. FINDINGS: CT CHEST FINDINGS Mediastinum/Lymph Nodes: No pathologically enlarged mediastinal, hilar or axillary lymph nodes. Heart is at the upper limits of normal in size. No pericardial effusion. A right-sided Morgagni hernia contains colon, stable. Lungs/Pleura: A rind of chronic appearing pleural thickening and fluid is seen in the right hemi thorax with a rounded soft tissue lesion in the right lower lobe, measuring 2.6 cm (series 5, image 34), stable. Surrounding bronchiectasis, emphysema and volume loss. Left lung is clear. No pleural fluid. Airway is unremarkable.  Musculoskeletal: Sclerotic lesions are seen throughout the visualized osseous structures, as before. No fracture. CT ABDOMEN PELVIS FINDINGS Hepatobiliary: The liver appears slightly decreased in attenuation diffusely. Liver measures at the upper limits of normal, 18.2 cm. Liver and gallbladder are otherwise unremarkable. No biliary ductal dilatation. Pancreas: Negative. Spleen: Negative. Adrenals/Urinary Tract: Right adrenal gland is poorly visualized. Left adrenal gland is unremarkable. Right kidney is somewhat ptotic. Kidneys are otherwise unremarkable. Ureters are decompressed. Bladder is grossly unremarkable. Stomach/Bowel: Stomach and small bowel are unremarkable. Large portion of the proximal colon is contained within a right Morgagni hernia. Colon is otherwise unremarkable. Vascular/Lymphatic: Scattered atherosclerotic calcification of the arterial vasculature without abdominal aortic aneurysm. No pathologically enlarged lymph nodes. Reproductive: Uterus and ovaries are visualized. Other: No free fluid.  Mesenteries and peritoneum are unremarkable. Musculoskeletal: Sclerotic lesions are seen throughout the visualized osseous structures. No fracture. IMPRESSION: 1. Chronic right fibrothorax, stable. No evidence of recurrent disease in the chest. 2. Osseous metastatic disease, stable. 3. 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 4. Liver appears slightly fatty and is borderline enlarged. Electronically Signed   By: Lorin Picket M.D.   On: 01/06/2015 14:09   Ct Abdomen Pelvis W Contrast  01/06/2015  CLINICAL DATA:  Lung cancer, radiation therapy complete. [name] and progress. Shortness of breath on exertion. Nausea and diarrhea. EXAM: CT CHEST, ABDOMEN AND PELVIS WITHOUT CONTRAST TECHNIQUE: Multidetector CT imaging of the chest, abdomen and pelvis was performed following the standard protocol without IV contrast. COMPARISON:  09/12/2014. FINDINGS: CT CHEST FINDINGS Mediastinum/Lymph  Nodes: No pathologically enlarged mediastinal, hilar or axillary lymph nodes. Heart is at the upper limits of normal in size. No pericardial effusion. A right-sided Morgagni hernia contains colon, stable. Lungs/Pleura: A rind of chronic appearing pleural thickening and fluid is seen in the right hemi thorax with a rounded soft tissue lesion in the right lower lobe, measuring 2.6 cm (series 5, image 34), stable. Surrounding bronchiectasis, emphysema and volume loss. Left lung is clear. No pleural fluid. Airway is unremarkable. Musculoskeletal: Sclerotic lesions are seen throughout the visualized osseous structures, as before. No fracture. CT ABDOMEN PELVIS FINDINGS Hepatobiliary: The liver appears slightly decreased in attenuation diffusely. Liver measures at the upper limits of normal, 18.2 cm. Liver and gallbladder are otherwise unremarkable. No biliary ductal dilatation. Pancreas: Negative. Spleen: Negative. Adrenals/Urinary Tract: Right adrenal gland is poorly visualized. Left adrenal gland is unremarkable. Right kidney is somewhat ptotic. Kidneys are otherwise unremarkable. Ureters are decompressed. Bladder is grossly unremarkable. Stomach/Bowel: Stomach and small bowel are unremarkable. Large portion of the proximal colon is contained within a right Morgagni hernia. Colon is otherwise unremarkable. Vascular/Lymphatic: Scattered atherosclerotic calcification of the arterial vasculature without abdominal aortic aneurysm. No pathologically enlarged lymph  nodes. Reproductive: Uterus and ovaries are visualized. Other: No free fluid.  Mesenteries and peritoneum are unremarkable. Musculoskeletal: Sclerotic lesions are seen throughout the visualized osseous structures. No fracture. IMPRESSION: 1. Chronic right fibrothorax, stable. No evidence of recurrent disease in the chest. 2. Osseous metastatic disease, stable. 3. 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 4. Liver appears slightly fatty and is  borderline enlarged. Electronically Signed   By: Lorin Picket M.D.   On: 01/06/2015 14:09   ASSESSMENT AND PLAN: This is a very pleasant 53 years old [place] American female with:  1)  Metastatic non-small cell lung cancer, adenocarcinoma with positive EGFR mutation in exon 19 currently undergoing systemic treatment with oral [name] status post 36 months of treatment. The patient is tolerating her treatment fairly well with no significant adverse effects. The recent CT scan of the chest, abdomen and pelvis showed no evidence for disease progression. I discussed the scan results with the patient today. I recommended for her to continue on [name] 150 mg by mouth daily. I will see her back for follow-up visit in 2 months for reevaluation and repeat blood work.  2) Metastatic bone disease:  the patient will continue on Xgeva 120 mg subcutaneously every 8 weeks.   3) For pain management, she will continue on the current pain medication with oxycodone.  The patient voices understanding of current disease status and treatment options and is in agreement with the current care plan.  All questions were answered. The patient knows to call the clinic with any problems, questions or concerns. We can certainly see the patient much sooner if necessary.  Disclaimer: This note was dictated with voice recognition software. Similar sounding words can inadvertently be transcribed and may be missed upon review.

## 2015-01-07 ENCOUNTER — Ambulatory Visit: Payer: Medicare Other | Admitting: Gastroenterology

## 2015-01-07 ENCOUNTER — Telehealth: Payer: Self-pay | Admitting: Internal Medicine

## 2015-01-07 NOTE — Telephone Encounter (Signed)
sw pt and advised on DEC appt...pt ok and aware °

## 2015-01-09 ENCOUNTER — Ambulatory Visit: Payer: Medicare Other | Admitting: Family Medicine

## 2015-01-17 ENCOUNTER — Encounter: Payer: Self-pay | Admitting: Family Medicine

## 2015-01-17 ENCOUNTER — Ambulatory Visit (INDEPENDENT_AMBULATORY_CARE_PROVIDER_SITE_OTHER): Payer: Medicare Other | Admitting: Family Medicine

## 2015-01-17 VITALS — BP 130/80 | HR 75 | Temp 98.6°F | Wt 269.9 lb

## 2015-01-17 DIAGNOSIS — R300 Dysuria: Secondary | ICD-10-CM

## 2015-01-17 DIAGNOSIS — N898 Other specified noninflammatory disorders of vagina: Secondary | ICD-10-CM

## 2015-01-17 LAB — POCT URINALYSIS DIPSTICK
BILIRUBIN UA: NEGATIVE
GLUCOSE UA: NEGATIVE
Ketones, UA: NEGATIVE
NITRITE UA: NEGATIVE
RBC UA: NEGATIVE
Spec Grav, UA: 1.02
UROBILINOGEN UA: 0.2
pH, UA: 7

## 2015-01-17 MED ORDER — METRONIDAZOLE 0.75 % VA GEL: 1.0000 | Freq: Two times a day (BID) | VAGINAL | Status: DC

## 2015-01-17 NOTE — Patient Instructions (Signed)

## 2015-01-17 NOTE — Progress Notes (Signed)
   Subjective:    Patient ID: Beth [name], female    DOB: 03-Jul-1960, 54 y.o.   MRN: [id number]  HPI Patient seen with "odor" to her vaginal discharge and urine recently. She is concerned about possible UTI but she's not had any burning with urination or consistent urine frequency. No nausea or vomiting. No fevers or chills. She has had some odorous vaginal discharge which she had the past with bacterial vaginosis.  Past Medical History  Diagnosis Date  . Asthma   . GERD (gastroesophageal reflux disease)   . Hypertension   . Heart murmur   . Fibromyalgia   . Arthritis of knee, degenerative   . Endocarditis     as teenager  . Complication of anesthesia     difficulty waking up  . Shortness of breath   . Lung mass     R- ADENOCARCINOMA  . Sleep apnea     no longer using CPAP  . Pleural effusion 08/30/11  . Anxiety   . Depression   . History of radiation therapy 09/29/11-11/04/2011    right lung 2700cGy 15 sessions  . Osseous metastasis ([place]) 09/20/11    per PET scan   Past Surgical History  Procedure Laterality Date  . Tubal ligation  2002  . Thoracentesis  09/02/11, 09/09/11    right-side pleural effusion    reports that she quit smoking about 4 years ago. Her smoking use included Cigarettes. She has a 7.5 pack-year smoking history. She does not have any smokeless tobacco history on file. She reports that she does not drink alcohol or use illicit drugs. family history includes Alcohol abuse in her father; Hypertension in her maternal aunt, maternal grandfather, maternal grandmother, maternal uncle, and mother; Sarcoidosis in her sister. Allergies  Allergen Reactions  . Meloxicam     Possible GI bleed      Review of Systems  Constitutional: Negative for fever and chills.  Genitourinary: Positive for dysuria and vaginal discharge. Negative for vaginal pain and pelvic pain.       Objective:   Physical Exam  Constitutional: She appears well-developed and well-nourished.  No distress.  Cardiovascular: Normal rate and regular rhythm.   Pulmonary/Chest: Effort normal and breath sounds normal. No respiratory distress. She has no wheezes.          Assessment & Plan:  Dysuria and vaginal discharge. Urine dipstick reveals only trace leukocytes otherwise clear. Clinically doubt UTI but will send urine culture. Suspect bacterial vaginosis. Treat with MetroGel vaginal twice daily for 5 days. She has used in the past and prefers this over pills which made her nauseous

## 2015-01-17 NOTE — Progress Notes (Signed)
Pre visit review using our clinic review tool, if applicable. No additional management support is needed unless otherwise documented below in the visit note. 

## 2015-01-20 LAB — URINE CULTURE: Colony Count: 85000

## 2015-01-23 ENCOUNTER — Telehealth: Payer: Self-pay

## 2015-01-23 DIAGNOSIS — C3490 Malignant neoplasm of unspecified part of unspecified bronchus or lung: Secondary | ICD-10-CM

## 2015-01-23 MED ORDER — OXYCODONE HCL 10 MG PO TABS: 10.0000 mg | ORAL_TABLET | Freq: Four times a day (QID) | ORAL | Status: DC | PRN

## 2015-01-23 NOTE — Telephone Encounter (Signed)
Pt called requesting refill on oxycodone. Rx prepared for signature.

## 2015-02-03 ENCOUNTER — Other Ambulatory Visit: Payer: Self-pay | Admitting: Medical Oncology

## 2015-02-03 DIAGNOSIS — C3490 Malignant neoplasm of unspecified part of unspecified bronchus or lung: Secondary | ICD-10-CM

## 2015-02-03 MED ORDER — ERLOTINIB HCL 150 MG PO TABS: 150.0000 mg | ORAL_TABLET | Freq: Every day | ORAL | Status: DC

## 2015-02-04 ENCOUNTER — Ambulatory Visit: Payer: Medicare Other | Admitting: Nurse Practitioner

## 2015-02-04 ENCOUNTER — Ambulatory Visit: Payer: Medicare Other

## 2015-02-04 ENCOUNTER — Other Ambulatory Visit: Payer: Medicare Other

## 2015-02-06 DIAGNOSIS — N950 Postmenopausal bleeding: Secondary | ICD-10-CM | POA: Diagnosis not present

## 2015-02-06 DIAGNOSIS — N760 Acute vaginitis: Secondary | ICD-10-CM | POA: Diagnosis not present

## 2015-02-19 ENCOUNTER — Other Ambulatory Visit: Payer: Medicare Other

## 2015-02-20 ENCOUNTER — Ambulatory Visit: Payer: Medicare Other

## 2015-02-21 ENCOUNTER — Other Ambulatory Visit: Payer: Self-pay | Admitting: Medical Oncology

## 2015-02-21 ENCOUNTER — Telehealth: Payer: Self-pay | Admitting: *Deleted

## 2015-02-21 DIAGNOSIS — C3490 Malignant neoplasm of unspecified part of unspecified bronchus or lung: Secondary | ICD-10-CM

## 2015-02-21 MED ORDER — IPRATROPIUM-ALBUTEROL 20-100 MCG/ACT IN AERS: 1.0000 | INHALATION_SPRAY | Freq: Four times a day (QID) | RESPIRATORY_TRACT | Status: DC | PRN

## 2015-02-21 MED ORDER — OXYCODONE HCL 10 MG PO TABS: 10.0000 mg | ORAL_TABLET | Freq: Four times a day (QID) | ORAL | Status: DC | PRN

## 2015-02-21 NOTE — Telephone Encounter (Signed)
TC from patient requesting a refill on her oxycodone 10 mg. Last filled in 01/23/15 for 90 tablets. Pt states she can pick up prescription on Tuesday,  02/25/15

## 2015-02-24 ENCOUNTER — Telehealth: Payer: Self-pay | Admitting: Internal Medicine

## 2015-02-24 NOTE — Telephone Encounter (Signed)
Gave and printed appt sched and avs..Marland Kitchen

## 2015-03-04 ENCOUNTER — Other Ambulatory Visit: Payer: Medicare Other

## 2015-03-04 ENCOUNTER — Ambulatory Visit: Payer: Medicare Other

## 2015-03-04 ENCOUNTER — Telehealth: Payer: Self-pay | Admitting: Internal Medicine

## 2015-03-04 ENCOUNTER — Ambulatory Visit: Payer: Medicare Other | Admitting: Nurse Practitioner

## 2015-03-04 NOTE — Telephone Encounter (Signed)
pt called to r/s appt to two mths per opof....done....pt ok and aware of new d.t

## 2015-03-06 ENCOUNTER — Telehealth: Payer: Self-pay | Admitting: Internal Medicine

## 2015-03-06 ENCOUNTER — Ambulatory Visit: Payer: Medicare Other

## 2015-03-06 ENCOUNTER — Other Ambulatory Visit: Payer: Self-pay | Admitting: Physician Assistant

## 2015-03-06 ENCOUNTER — Ambulatory Visit (HOSPITAL_BASED_OUTPATIENT_CLINIC_OR_DEPARTMENT_OTHER): Payer: Medicare Other | Admitting: Physician Assistant

## 2015-03-06 ENCOUNTER — Other Ambulatory Visit (HOSPITAL_BASED_OUTPATIENT_CLINIC_OR_DEPARTMENT_OTHER): Payer: Medicare Other

## 2015-03-06 VITALS — BP 135/91 | HR 102 | Temp 98.7°F | Resp 18 | Ht 67.0 in | Wt 269.9 lb

## 2015-03-06 DIAGNOSIS — G893 Neoplasm related pain (acute) (chronic): Secondary | ICD-10-CM

## 2015-03-06 DIAGNOSIS — C3490 Malignant neoplasm of unspecified part of unspecified bronchus or lung: Secondary | ICD-10-CM | POA: Diagnosis not present

## 2015-03-06 DIAGNOSIS — C3410 Malignant neoplasm of upper lobe, unspecified bronchus or lung: Secondary | ICD-10-CM

## 2015-03-06 DIAGNOSIS — E871 Hypo-osmolality and hyponatremia: Secondary | ICD-10-CM

## 2015-03-06 DIAGNOSIS — F419 Anxiety disorder, unspecified: Secondary | ICD-10-CM

## 2015-03-06 DIAGNOSIS — D75839 Thrombocytosis, unspecified: Secondary | ICD-10-CM

## 2015-03-06 DIAGNOSIS — D649 Anemia, unspecified: Secondary | ICD-10-CM

## 2015-03-06 DIAGNOSIS — C3491 Malignant neoplasm of unspecified part of right bronchus or lung: Secondary | ICD-10-CM | POA: Diagnosis present

## 2015-03-06 DIAGNOSIS — D473 Essential (hemorrhagic) thrombocythemia: Secondary | ICD-10-CM

## 2015-03-06 DIAGNOSIS — Z7951 Long term (current) use of inhaled steroids: Secondary | ICD-10-CM

## 2015-03-06 DIAGNOSIS — D72829 Elevated white blood cell count, unspecified: Secondary | ICD-10-CM

## 2015-03-06 LAB — CBC WITH DIFFERENTIAL/PLATELET
BASO%: 0.2 % (ref 0.0–2.0)
Basophils Absolute: 0 10*3/uL (ref 0.0–0.1)
EOS ABS: 0.2 10*3/uL (ref 0.0–0.5)
EOS%: 2.9 % (ref 0.0–7.0)
HEMATOCRIT: 37.4 % (ref 34.8–46.6)
HEMOGLOBIN: 12.1 g/dL (ref 11.6–15.9)
LYMPH%: 38.3 % (ref 14.0–49.7)
MCH: 29.8 pg (ref 25.1–34.0)
MCHC: 32.4 g/dL (ref 31.5–36.0)
MCV: 92.1 fL (ref 79.5–101.0)
MONO#: 0.4 10*3/uL (ref 0.1–0.9)
MONO%: 7.5 % (ref 0.0–14.0)
NEUT%: 51.1 % (ref 38.4–76.8)
NEUTROS ABS: 2.9 10*3/uL (ref 1.5–6.5)
PLATELETS: 264 10*3/uL (ref 145–400)
RBC: 4.06 10*6/uL (ref 3.70–5.45)
RDW: 16.4 % — AB (ref 11.2–14.5)
WBC: 5.6 10*3/uL (ref 3.9–10.3)
lymph#: 2.1 10*3/uL (ref 0.9–3.3)

## 2015-03-06 LAB — COMPREHENSIVE METABOLIC PANEL
ALBUMIN: 3.8 g/dL (ref 3.5–5.0)
ALK PHOS: 79 U/L (ref 40–150)
ALT: 15 U/L (ref 0–55)
ANION GAP: 9 meq/L (ref 3–11)
AST: 18 U/L (ref 5–34)
BILIRUBIN TOTAL: 0.56 mg/dL (ref 0.20–1.20)
BUN: 14.7 mg/dL (ref 7.0–26.0)
CALCIUM: 9.4 mg/dL (ref 8.4–10.4)
CO2: 26 mEq/L (ref 22–29)
Chloride: 106 mEq/L (ref 98–109)
Creatinine: 0.8 mg/dL (ref 0.6–1.1)
Glucose: 94 mg/dl (ref 70–140)
Potassium: 3.7 mEq/L (ref 3.5–5.1)
Sodium: 141 mEq/L (ref 136–145)
TOTAL PROTEIN: 7.8 g/dL (ref 6.4–8.3)

## 2015-03-06 MED ORDER — TEMAZEPAM 15 MG PO CAPS: ORAL_CAPSULE | ORAL | Status: DC

## 2015-03-06 NOTE — Telephone Encounter (Signed)
Gave and printed appt sched and avs fo rpt for Feb...the patient coming back tomorrow to barium

## 2015-03-06 NOTE — Progress Notes (Signed)
[facility] [phone]   [phone]) [phone]  OFFICE PROGRESS NOTE  [name], MD [place] [address]  DIAGNOSIS AND STAGE: Metastatic non-small cell lung cancer, adenocarcinoma with positive EGFR mutation in exon 19 diagnosed in June of 2013   PRIOR THERAPY: palliative radiotherapy to the right lung under the care of Dr. Valere Dross expected to be completed on 10/29/2011.   CURRENT THERAPY:  1. [name] 150 mg by mouth daily started 10/11/2011. Status post 38 months of therapy 2. Xgeva 120 mg subcutaneously every 8 weeks.  CHEMOTHERAPY INTENT: Palliative  CURRENT # OF CHEMOTHERAPY CYCLES: 38 CURRENT ANTIEMETICS: Compazine  CURRENT SMOKING STATUS: Former smoker but quit.  ORAL CHEMOTHERAPY AND CONSENT: Oral [name]  CURRENT BISPHOSPHONATES USE: Xgeva, latest dose given on 02/1515 PAIN MANAGEMENT: well-controlled with oxycodone when necessary.  NARCOTICS INDUCED CONSTIPATION: None  LIVING WILL AND CODE STATUS: Full code.   INTERVAL HISTORY: Beth [name] 54 y.o. female returns to the clinic today for two-month followup visit. The patient is feeling fine today with no specific complaints. She had mild fatigue. She denied having any significant chest pain, has mild shortness of breath with exertion but not at rest,denies cough or hemoptysis. She denied having any significant skin rash, redness or diarrhea. The patient denied having any significant weight loss or night sweats. She is tolerating her treatment with [name] fairly well. SHe is here for follow up and for Xgeva injection  MEDICAL HISTORY: Past Medical History  Diagnosis Date  . Asthma   . GERD (gastroesophageal reflux disease)   . Hypertension   . Heart murmur   . Fibromyalgia   . Arthritis of knee, degenerative   . Endocarditis     as teenager  . Complication of anesthesia     difficulty waking up  . Shortness of breath   . Lung mass     R-  ADENOCARCINOMA  . Sleep apnea     no longer using CPAP  . Pleural effusion 08/30/11  . Anxiety   . Depression   . History of radiation therapy 09/29/11-11/04/2011    right lung 2700cGy 15 sessions  . Osseous metastasis ([place]) 09/20/11    per PET scan    ALLERGIES:  is allergic to meloxicam.  MEDICATIONS:  Current Outpatient Prescriptions  Medication Sig Dispense Refill  . ergocalciferol (VITAMIN D2) 50000 UNITS capsule Take 1 capsule (50,000 Units total) by mouth once a week. 12 capsule 1  . erlotinib ([name]) 150 MG tablet Take 1 tablet (150 mg total) by mouth daily. Take on an empty stomach 1 hour before meals or 2 hours after. 30 tablet 1  . fish oil-omega-3 fatty acids 1000 MG capsule Take 2 g by mouth daily.    . furosemide (LASIX) 20 MG tablet Take one daily as needed for severe edema. 30 tablet 1  . guaiFENesin-codeine (ROBITUSSIN AC) 100-10 MG/5ML syrup Take 5 mLs by mouth 3 (three) times daily as needed for cough. 120 mL 0  . hydrochlorothiazide (HYDRODIURIL) 25 MG tablet TAKE 1 TABLET BY MOUTH DAILY 30 tablet 5  . Ipratropium-Albuterol (COMBIVENT) 20-100 MCG/ACT AERS respimat Inhale 1 puff into the lungs every 6 (six) hours as needed for wheezing. 4 g 0  . loperamide (IMODIUM) 2 MG capsule Take 2 mg by mouth 4 (four) times daily as needed for diarrhea or loose stools.    . metroNIDAZOLE (METROGEL) 0.75 % vaginal gel Place 1 Applicatorful vaginally 2 (two) times daily. 70 g  0  . mometasone (NASONEX) 50 MCG/ACT nasal spray Place 2 sprays into the nose daily. 17 g 12  . Multiple Vitamins-Minerals (MULTIVITAMIN WITH MINERALS) tablet Take 1 tablet by mouth daily.    . mupirocin ointment (BACTROBAN) 2 % Place 1 application into the nose 2 (two) times daily. 22 g 0  . Oxycodone HCl 10 MG TABS Take 1 tablet (10 mg total) by mouth every 6 (six) hours as needed. 90 tablet 0  . prochlorperazine (COMPAZINE) 10 MG tablet Take 1 tablet (10 mg total) by mouth every 6 (six) hours as needed. 30  tablet 1  . temazepam (RESTORIL) 15 MG capsule TAKE ONE CAPSULE AT BEDTIME AS NEEDED 30 capsule 0  . venlafaxine XR (EFFEXOR-XR) 150 MG 24 hr capsule TAKE ONE CAPSULE BY MOUTH EVERY DAY 30 capsule 0   No current facility-administered medications for this visit.    SURGICAL HISTORY:  Past Surgical History  Procedure Laterality Date  . Tubal ligation  2002  . Thoracentesis  09/02/11, 09/09/11    right-side pleural effusion    REVIEW OF SYSTEMS:  Constitutional: positive for mild fatigue Eyes: negative Ears, nose, mouth, throat, and face: negative Respiratory: positive for dyspnea on exertion Cardiovascular: negative Gastrointestinal: negative Genitourinary:negative Integument/breast: negative Hematologic/lymphatic: negative Musculoskeletal:negative Neurological: negative Behavioral/Psych: negative Endocrine: negative Allergic/Immunologic: negative   PHYSICAL EXAMINATION: General appearance: alert, cooperative and no distress Head: Normocephalic, without obvious abnormality, atraumatic Neck: no adenopathy, no JVD, supple, symmetrical, trachea midline and thyroid not enlarged, symmetric, no tenderness/mass/nodules Lymph nodes: Cervical, supraclavicular, and axillary nodes normal. Resp: diminished breath sounds RLL , clear on the left lung Back: symmetric, no curvature. ROM normal. No CVA tenderness. Cardio: regular rate and rhythm, S1, S2 normal, no murmur, click, rub or gallop GI: soft, non-tender; bowel sounds normal; no masses,  no organomegaly Extremities: extremities normal, atraumatic, no cyanosis or edema. There is darkening of the skin. Neurologic: Alert and oriented X 3, normal strength and tone. Normal symmetric reflexes. Normal coordination and gait  ECOG PERFORMANCE STATUS: 1 - Symptomatic but completely ambulatory  Blood pressure 135/91, pulse 102, temperature 98.7 F (37.1 C), temperature source Oral, resp. rate 18, height _0  (1.702 m), weight 269 lb 14.4 oz  (122.426 kg), SpO2 99 %.  LABORATORY DATA: CBC Latest Ref Rng 03/06/2015 01/06/2015 12/03/2014  WBC 3.9 - 10.3 10e3/uL 5.6 6.5 6.6  Hemoglobin 11.6 - 15.9 g/dL 12.1 11.6 11.2(L)  Hematocrit 34.8 - 46.6 % 37.4 35.7 34.1(L)  Platelets 145 - 400 10e3/uL 264 264 313   CMP Latest Ref Rng 01/06/2015 12/03/2014 10/31/2014  Glucose 70 - 140 mg/dl 87 75 94  BUN 7.0 - 26.0 mg/dL 15.3 16.0 19.1  Creatinine 0.6 - 1.1 mg/dL 0.8 0.8 0.9  Sodium 136 - 145 mEq/L 143 142 141  Potassium 3.5 - 5.1 mEq/L 4.5 4.2 4.3  Chloride 98 - 107 mEq/L - - -  CO2 22 - 29 mEq/L _1 Calcium 8.4 - 10.4 mg/dL 8.8 9.0 9.1  Total Protein 6.4 - 8.3 g/dL 7.8 7.8 7.5  Total Bilirubin 0.20 - 1.20 mg/dL 0.49 0.55 0.34  Alkaline Phos 40 - 150 U/L 90 97 85  AST 5 - 34 U/L _2 ALT 0 - 55 U/L _3 RADIOGRAPHIC STUDIES: No results found. ASSESSMENT AND PLAN: This is a very pleasant 54 years old African American female with:  1)  Metastatic non-small cell lung cancer, adenocarcinoma with positive EGFR mutation in exon  49 currently undergoing systemic treatment with oral [name] status post 39 months of treatment. The patient is tolerating her treatment fairly well with no significant adverse effects. The most recent CT scan of the chest, abdomen and pelvis in October showed no evidence for disease progression. Recommended for her to continue on [name] 150 mg by mouth daily. I will see her back for follow-up visit in 2 months for reevaluation and repeat blood work. A repeat staging CT will be performed 1 week prior to the visit  2) Metastatic bone disease:  She will receive XGeva today. the patient will continue on Xgeva 120 mg subcutaneously every 8 weeks.   3) For pain management, she will continue on the current pain medication with oxycodone.  4. Anxiety: Temazepam was refilled today   The patient voices understanding of current disease status and treatment options and is in agreement with the current  care plan.  All questions were answered. The patient knows to call the clinic with any problems, questions or concerns. We can certainly see the patient much sooner if necessary.  ADDENDUM: Hematology/Oncology Attending: I had a face to face encounter with the patient. I recommended her care plan. This is a very pleasant 54 years old African-American female with metastatic non-small cell lung cancer, adenocarcinoma with positive EGFR mutation. She is currently undergoing treatment with targeted therapy was [name] 150 mg by mouth daily status post 39 cycles and has been tolerating her treatment fairly well. I recommended for the patient to continue her treatment with [name] as a scheduled. The patient would come back for follow-up visit in 2 months for reevaluation after repeating CT scan of the chest, abdomen and pelvis for restaging of her disease. She was advised to call immediately if she has any concerning symptoms in the interval.  Disclaimer: This note was dictated with voice recognition software. Similar sounding words can inadvertently be transcribed and may be missed upon review. Eilleen Kempf., MD 03/08/2015

## 2015-03-07 ENCOUNTER — Ambulatory Visit (HOSPITAL_BASED_OUTPATIENT_CLINIC_OR_DEPARTMENT_OTHER): Payer: Medicare Other

## 2015-03-07 VITALS — BP 153/96 | HR 102 | Temp 98.6°F

## 2015-03-07 DIAGNOSIS — C7951 Secondary malignant neoplasm of bone: Secondary | ICD-10-CM | POA: Diagnosis present

## 2015-03-07 DIAGNOSIS — C3490 Malignant neoplasm of unspecified part of unspecified bronchus or lung: Secondary | ICD-10-CM

## 2015-03-07 MED ORDER — DENOSUMAB 120 MG/1.7ML ~~LOC~~ SOLN
120.0000 mg | Freq: Once | SUBCUTANEOUS | Status: AC
  Administered 2015-03-07: 120 mg via SUBCUTANEOUS
  Filled 2015-03-07: qty 1.7

## 2015-03-19 ENCOUNTER — Other Ambulatory Visit: Payer: Self-pay | Admitting: *Deleted

## 2015-03-19 DIAGNOSIS — C3490 Malignant neoplasm of unspecified part of unspecified bronchus or lung: Secondary | ICD-10-CM

## 2015-03-19 MED ORDER — OXYCODONE HCL 10 MG PO TABS: 10.0000 mg | ORAL_TABLET | Freq: Four times a day (QID) | ORAL | Status: DC | PRN

## 2015-03-19 NOTE — Telephone Encounter (Signed)
Pt called with request for pain med refill. Pt will pick up on Thursday. Rx ready.

## 2015-03-27 ENCOUNTER — Other Ambulatory Visit: Payer: Medicare Other

## 2015-03-27 ENCOUNTER — Other Ambulatory Visit: Payer: Self-pay | Admitting: Medical Oncology

## 2015-03-27 ENCOUNTER — Ambulatory Visit: Payer: Medicare Other

## 2015-03-27 ENCOUNTER — Ambulatory Visit: Payer: Medicare Other | Admitting: Internal Medicine

## 2015-03-27 DIAGNOSIS — C3490 Malignant neoplasm of unspecified part of unspecified bronchus or lung: Secondary | ICD-10-CM

## 2015-03-27 MED ORDER — ERLOTINIB HCL 150 MG PO TABS: 150.0000 mg | ORAL_TABLET | Freq: Every day | ORAL | Status: DC

## 2015-04-01 ENCOUNTER — Other Ambulatory Visit: Payer: Self-pay | Admitting: Family Medicine

## 2015-04-16 ENCOUNTER — Telehealth: Payer: Self-pay | Admitting: *Deleted

## 2015-04-16 DIAGNOSIS — C3410 Malignant neoplasm of upper lobe, unspecified bronchus or lung: Secondary | ICD-10-CM

## 2015-04-16 DIAGNOSIS — C3491 Malignant neoplasm of unspecified part of right bronchus or lung: Secondary | ICD-10-CM

## 2015-04-16 MED ORDER — TEMAZEPAM 15 MG PO CAPS: ORAL_CAPSULE | ORAL | Status: DC

## 2015-04-16 NOTE — Telephone Encounter (Signed)
Pt refill request from Pharmacy. Rx for Restoril.Reviewed with MD, opk to refill. Faxed to pt phamacy

## 2015-04-17 ENCOUNTER — Other Ambulatory Visit: Payer: Self-pay | Admitting: *Deleted

## 2015-04-17 DIAGNOSIS — C3490 Malignant neoplasm of unspecified part of unspecified bronchus or lung: Secondary | ICD-10-CM

## 2015-04-17 MED ORDER — ERLOTINIB HCL 150 MG PO TABS: 150.0000 mg | ORAL_TABLET | Freq: Every day | ORAL | Status: DC

## 2015-04-18 ENCOUNTER — Telehealth: Payer: Self-pay | Admitting: Internal Medicine

## 2015-04-18 ENCOUNTER — Telehealth: Payer: Self-pay

## 2015-04-18 NOTE — Telephone Encounter (Signed)
Spoke to patient to confirm upcoming appointments.

## 2015-04-18 NOTE — Telephone Encounter (Signed)
Family requesting refill on oxycodone, they will pick it up on Monday.

## 2015-04-21 ENCOUNTER — Other Ambulatory Visit: Payer: Self-pay | Admitting: Medical Oncology

## 2015-04-21 DIAGNOSIS — C3490 Malignant neoplasm of unspecified part of unspecified bronchus or lung: Secondary | ICD-10-CM

## 2015-04-21 MED ORDER — OXYCODONE HCL 10 MG PO TABS: 10.0000 mg | ORAL_TABLET | Freq: Four times a day (QID) | ORAL | Status: DC | PRN

## 2015-04-21 MED FILL — oxyCODONE HCL 10 MG TABS: 10 | 22 days supply | Qty: 90 | Fill #0

## 2015-04-21 NOTE — Progress Notes (Signed)
Pt asking for tarceva refill. I called [facility] re rx from 04/17/15-staff said medication delivered on the 04/17/15. Will notify pt.

## 2015-04-21 NOTE — Progress Notes (Signed)
rx locked up for pick up today

## 2015-04-24 ENCOUNTER — Ambulatory Visit (HOSPITAL_COMMUNITY): Payer: Medicare Other

## 2015-04-29 ENCOUNTER — Telehealth: Payer: Self-pay | Admitting: *Deleted

## 2015-04-29 NOTE — Telephone Encounter (Signed)
"  I need a refill for [name].  I do not like waiting for my refills so please send one in for next month.  I also need to verify appointment times or scan, lab, F/U on Thursday.  Appointment times provided.  No further questions at this time.

## 2015-05-01 ENCOUNTER — Other Ambulatory Visit (HOSPITAL_BASED_OUTPATIENT_CLINIC_OR_DEPARTMENT_OTHER): Payer: Medicare Other

## 2015-05-01 ENCOUNTER — Ambulatory Visit (HOSPITAL_BASED_OUTPATIENT_CLINIC_OR_DEPARTMENT_OTHER): Payer: Medicare Other

## 2015-05-01 ENCOUNTER — Ambulatory Visit (HOSPITAL_COMMUNITY)
Admission: RE | Admit: 2015-05-01 | Discharge: 2015-05-01 | Disposition: A | Discharge status: Home / Self Care | Payer: Medicare Other | Source: Ambulatory Visit | Attending: Nurse Practitioner | Admitting: Nurse Practitioner

## 2015-05-01 ENCOUNTER — Ambulatory Visit (HOSPITAL_BASED_OUTPATIENT_CLINIC_OR_DEPARTMENT_OTHER): Payer: Medicare Other | Admitting: Internal Medicine

## 2015-05-01 ENCOUNTER — Encounter: Payer: Self-pay | Admitting: Internal Medicine

## 2015-05-01 VITALS — BP 136/83 | HR 110 | Temp 99.1°F | Resp 18 | Ht 67.0 in | Wt 272.5 lb

## 2015-05-01 DIAGNOSIS — C3491 Malignant neoplasm of unspecified part of right bronchus or lung: Secondary | ICD-10-CM

## 2015-05-01 DIAGNOSIS — D649 Anemia, unspecified: Secondary | ICD-10-CM

## 2015-05-01 DIAGNOSIS — D72829 Elevated white blood cell count, unspecified: Secondary | ICD-10-CM

## 2015-05-01 DIAGNOSIS — D473 Essential (hemorrhagic) thrombocythemia: Secondary | ICD-10-CM

## 2015-05-01 DIAGNOSIS — C7951 Secondary malignant neoplasm of bone: Secondary | ICD-10-CM

## 2015-05-01 DIAGNOSIS — D75839 Thrombocytosis, unspecified: Secondary | ICD-10-CM

## 2015-05-01 DIAGNOSIS — K469 Unspecified abdominal hernia without obstruction or gangrene: Secondary | ICD-10-CM | POA: Insufficient documentation

## 2015-05-01 DIAGNOSIS — K760 Fatty (change of) liver, not elsewhere classified: Secondary | ICD-10-CM | POA: Insufficient documentation

## 2015-05-01 DIAGNOSIS — C3490 Malignant neoplasm of unspecified part of unspecified bronchus or lung: Secondary | ICD-10-CM

## 2015-05-01 DIAGNOSIS — G893 Neoplasm related pain (acute) (chronic): Secondary | ICD-10-CM

## 2015-05-01 DIAGNOSIS — C3410 Malignant neoplasm of upper lobe, unspecified bronchus or lung: Secondary | ICD-10-CM | POA: Diagnosis present

## 2015-05-01 DIAGNOSIS — R0602 Shortness of breath: Secondary | ICD-10-CM | POA: Diagnosis not present

## 2015-05-01 DIAGNOSIS — R05 Cough: Secondary | ICD-10-CM | POA: Diagnosis not present

## 2015-05-01 DIAGNOSIS — E871 Hypo-osmolality and hyponatremia: Secondary | ICD-10-CM

## 2015-05-01 LAB — COMPREHENSIVE METABOLIC PANEL
ALK PHOS: 85 U/L (ref 40–150)
ALT: 15 U/L (ref 0–55)
AST: 21 U/L (ref 5–34)
Albumin: 3.6 g/dL (ref 3.5–5.0)
Anion Gap: 11 mEq/L (ref 3–11)
BUN: 17 mg/dL (ref 7.0–26.0)
CALCIUM: 9.2 mg/dL (ref 8.4–10.4)
CO2: 24 mEq/L (ref 22–29)
CREATININE: 0.8 mg/dL (ref 0.6–1.1)
Chloride: 105 mEq/L (ref 98–109)
EGFR: >90 mL/min/{1.73_m2} (ref >90)
GLUCOSE: 77 mg/dL (ref 70–140)
POTASSIUM: 4.2 meq/L (ref 3.5–5.1)
Sodium: 140 mEq/L (ref 136–145)
Total Bilirubin: 0.62 mg/dL (ref 0.20–1.20)
Total Protein: 7.8 g/dL (ref 6.4–8.3)

## 2015-05-01 LAB — CBC WITH DIFFERENTIAL/PLATELET
BASO%: 0.1 % (ref 0.0–2.0)
Basophils Absolute: 0 10*3/uL (ref 0.0–0.1)
EOS ABS: 0.3 10*3/uL (ref 0.0–0.5)
EOS%: 2.9 % (ref 0.0–7.0)
HCT: 35.1 % (ref 34.8–46.6)
HGB: 11.3 g/dL — ABNORMAL LOW (ref 11.6–15.9)
LYMPH%: 33 % (ref 14.0–49.7)
MCH: 30.1 pg (ref 25.1–34.0)
MCHC: 32.2 g/dL (ref 31.5–36.0)
MCV: 93.6 fL (ref 79.5–101.0)
MONO#: 0.4 10*3/uL (ref 0.1–0.9)
MONO%: 4.9 % (ref 0.0–14.0)
NEUT#: 5.3 10*3/uL (ref 1.5–6.5)
NEUT%: 59.1 % (ref 38.4–76.8)
PLATELETS: 301 10*3/uL (ref 145–400)
RBC: 3.75 10*6/uL (ref 3.70–5.45)
RDW: 15.6 % — ABNORMAL HIGH (ref 11.2–14.5)
WBC: 8.9 10*3/uL (ref 3.9–10.3)
lymph#: 3 10*3/uL (ref 0.9–3.3)

## 2015-05-01 MED ORDER — INTEGRA PLUS PO CAPS: 1.0000 | ORAL_CAPSULE | Freq: Every day | ORAL | Status: DC

## 2015-05-01 MED ORDER — DENOSUMAB 120 MG/1.7ML ~~LOC~~ SOLN
120.0000 mg | Freq: Once | SUBCUTANEOUS | Status: AC
  Administered 2015-05-01: 120 mg via SUBCUTANEOUS
  Filled 2015-05-01: qty 1.7

## 2015-05-01 MED ORDER — IOHEXOL 300 MG/ML  SOLN
75.0000 mL | Freq: Once | INTRAMUSCULAR | Status: AC | PRN
Start: 2015-05-01 — End: 2015-05-01
  Administered 2015-05-01: 75 mL via INTRAVENOUS

## 2015-05-01 NOTE — Progress Notes (Signed)
[facility] Telephone:([phone]) [phone]   Fax:(336) [phone]  OFFICE PROGRESS NOTE  [name], MD [place] [address]  DIAGNOSIS AND STAGE: Metastatic non-small cell lung cancer, adenocarcinoma with positive EGFR mutation in exon 19 diagnosed in June of 2013   PRIOR THERAPY: palliative radiotherapy to the right lung under the care of Dr. Valere Dross expected to be completed on 10/29/2011.   CURRENT THERAPY:  1. [name] 150 mg by mouth daily started 10/11/2011. Status post 40 months of therapy 2. Xgeva 120 mg subcutaneously every 8 weeks.  CHEMOTHERAPY INTENT: Palliative  CURRENT # OF CHEMOTHERAPY CYCLES: 41 CURRENT ANTIEMETICS: Compazine  CURRENT SMOKING STATUS: Former smoker but quit.  ORAL CHEMOTHERAPY AND CONSENT: Oral [name]  CURRENT BISPHOSPHONATES USE: Xgeva  PAIN MANAGEMENT: well-controlled with oxycodone when necessary.  NARCOTICS INDUCED CONSTIPATION: None  LIVING WILL AND CODE STATUS: Full code.   INTERVAL HISTORY: Beth [name] 55 y.o. female returns to the clinic today for two-month followup visit. The patient is feeling fine today with no specific complaints except for intermittent low back pain and mild fatigue.  She denied having any significant chest pain, shortness of breath, cough or hemoptysis. She denied having any significant skin rash or diarrhea. The patient denied having any significant weight loss or night sweats. She is tolerating her treatment with [name] fairly well. The patient had repeat CT scan of the chest, abdomen and pelvis performed earlier today and she is here for evaluation and discussion of her scan results.  MEDICAL HISTORY: Past Medical History  Diagnosis Date  . Asthma   . GERD (gastroesophageal reflux disease)   . Hypertension   . Heart murmur   . Fibromyalgia   . Arthritis of knee, degenerative   . Endocarditis     as teenager  . Complication of anesthesia     difficulty waking up    . Shortness of breath   . Lung mass     R- ADENOCARCINOMA  . Sleep apnea     no longer using CPAP  . Pleural effusion 08/30/11  . Anxiety   . Depression   . History of radiation therapy 09/29/11-11/04/2011    right lung 2700cGy 15 sessions  . Osseous metastasis ([place]) 09/20/11    per PET scan    ALLERGIES:  is allergic to meloxicam.  MEDICATIONS:  Current Outpatient Prescriptions  Medication Sig Dispense Refill  . amLODipine (NORVASC) 5 MG tablet TK 1 T PO ONCE A DAY  5  . ergocalciferol (VITAMIN D2) 50000 UNITS capsule Take 1 capsule (50,000 Units total) by mouth once a week. 12 capsule 1  . erlotinib ([name]) 150 MG tablet Take 1 tablet (150 mg total) by mouth daily. Take on an empty stomach 1 hour before meals or 2 hours after. 30 tablet 1  . fish oil-omega-3 fatty acids 1000 MG capsule Take 2 g by mouth daily.    . fluconazole (DIFLUCAN) 150 MG tablet TK 1 T PO AS A SINGLE DOSE. MAY REPEAT IN 1 WEEK IF SYMPTOMS PERSIST  1  . furosemide (LASIX) 20 MG tablet Take one daily as needed for severe edema. 30 tablet 1  . guaiFENesin-codeine (ROBITUSSIN AC) 100-10 MG/5ML syrup Take 5 mLs by mouth 3 (three) times daily as needed for cough. 120 mL 0  . hydrochlorothiazide (HYDRODIURIL) 25 MG tablet TAKE 1 TABLET BY MOUTH DAILY 30 tablet 5  . Ipratropium-Albuterol (COMBIVENT) 20-100 MCG/ACT AERS respimat Inhale 1 puff into the lungs every 6 (  six) hours as needed for wheezing. 4 g 0  . loperamide (IMODIUM) 2 MG capsule Take 2 mg by mouth 4 (four) times daily as needed for diarrhea or loose stools.    . metroNIDAZOLE (METROGEL) 0.75 % vaginal gel Place 1 Applicatorful vaginally 2 (two) times daily. 70 g 0  . mometasone (NASONEX) 50 MCG/ACT nasal spray Place 2 sprays into the nose daily. 17 g 12  . Multiple Vitamins-Minerals (MULTIVITAMIN WITH MINERALS) tablet Take 1 tablet by mouth daily.    . mupirocin ointment (BACTROBAN) 2 % APPLY INTO EACH NOSTRIL TWICE DAILY 22 g 0  . Oxycodone HCl 10 MG  TABS Take 1 tablet (10 mg total) by mouth every 6 (six) hours as needed. 90 tablet 0  . prochlorperazine (COMPAZINE) 10 MG tablet Take 1 tablet (10 mg total) by mouth every 6 (six) hours as needed. 30 tablet 1  . temazepam (RESTORIL) 15 MG capsule TAKE ONE CAPSULE AT BEDTIME AS NEEDED 30 capsule 0  . venlafaxine XR (EFFEXOR-XR) 150 MG 24 hr capsule TAKE ONE CAPSULE BY MOUTH EVERY DAY 30 capsule 0   No current facility-administered medications for this visit.    SURGICAL HISTORY:  Past Surgical History  Procedure Laterality Date  . Tubal ligation  2002  . Thoracentesis  09/02/11, 09/09/11    right-side pleural effusion    REVIEW OF SYSTEMS:  Constitutional: negative Eyes: negative Ears, nose, mouth, throat, and face: negative Respiratory: positive for dyspnea on exertion Cardiovascular: negative Gastrointestinal: negative Genitourinary:negative Integument/breast: negative Hematologic/lymphatic: negative Musculoskeletal:negative Neurological: negative Behavioral/Psych: negative Endocrine: negative Allergic/Immunologic: negative   PHYSICAL EXAMINATION: General appearance: alert, cooperative and no distress Head: Normocephalic, without obvious abnormality, atraumatic Neck: no adenopathy, no JVD, supple, symmetrical, trachea midline and thyroid not enlarged, symmetric, no tenderness/mass/nodules Lymph nodes: Cervical, supraclavicular, and axillary nodes normal. Resp: diminished breath sounds RLL and dullness to percussion RLL Back: symmetric, no curvature. ROM normal. No CVA tenderness. Cardio: regular rate and rhythm, S1, S2 normal, no murmur, click, rub or gallop GI: soft, non-tender; bowel sounds normal; no masses,  no organomegaly Extremities: extremities normal, atraumatic, no cyanosis or edema. There is darkening of the skin and swelling of the right fourth toe. Neurologic: Alert and oriented X 3, normal strength and tone. Normal symmetric reflexes. Normal coordination and  gait  ECOG PERFORMANCE STATUS: 1 - Symptomatic but completely ambulatory  Blood pressure 136/83, pulse 110, temperature 99.1 F (37.3 C), temperature source Oral, resp. rate 18, height _0  (1.702 m), weight 272 lb 8 oz (123.605 kg), SpO2 99 %.  LABORATORY DATA: Lab Results  Component Value Date   WBC 8.9 05/01/2015   HGB 11.3* 05/01/2015   HCT 35.1 05/01/2015   MCV 93.6 05/01/2015   PLT 301 05/01/2015      Chemistry      Component Value Date/Time   NA 141 03/06/2015 1330   NA 139 05/23/2012 1305   K 3.7 03/06/2015 1330   K 4.0 05/23/2012 1305   CL 104 08/18/2012 1154   CL 100 05/23/2012 1305   CO2 26 03/06/2015 1330   CO2 30 05/23/2012 1305   BUN 14.7 03/06/2015 1330   BUN 16 05/23/2012 1305   CREATININE 0.8 03/06/2015 1330   CREATININE 0.74 05/23/2012 1305      Component Value Date/Time   CALCIUM 9.4 03/06/2015 1330   CALCIUM 9.0 05/23/2012 1305   ALKPHOS 79 03/06/2015 1330   ALKPHOS 113 05/23/2012 1305   AST 18 03/06/2015 1330   AST 15 05/23/2012  1305   ALT 15 03/06/2015 1330   ALT 7 05/23/2012 1305   BILITOT 0.56 03/06/2015 1330   BILITOT 0.6 05/23/2012 1305       RADIOGRAPHIC STUDIES: Ct Chest W Contrast  05/01/2015  CLINICAL DATA:  55 year old female with history of lung cancer diagnosed in June 2013. Status post radiation therapy, now complete. [name] therapy in progress. Cough and shortness of breath. EXAM: CT CHEST WITH CONTRAST TECHNIQUE: Multidetector CT imaging of the chest was performed during intravenous contrast administration. CONTRAST:  63m OMNIPAQUE IOHEXOL 300 MG/ML  SOLN COMPARISON:  Multiple priors, most recently chest CT 01/06/2015. FINDINGS: Mediastinum/Lymph Nodes: Heart size is mildly enlarged. There is no significant pericardial fluid, thickening or pericardial calcification. Severe thickening calcification of the aortic valve and mitral-aortic intervalvular fibrosa. No pathologically enlarged mediastinal or hilar lymph nodes. Esophagus is  unremarkable in appearance. Chronic rightward shift of cardiomediastinal structures, similar to prior examinations. No axillary lymphadenopathy. 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 Lungs/Pleura: Chronic right-sided pleural thickening and trace right-sided chronic pleural effusion is unchanged. Chronic volume loss throughout the right lung where there is diffuse septal thickening, similar to prior studies. Cystic changes are noted in the left lower lobe (unchanged). Partially calcified nodular density in the right lung base (image 36 of series 7) measuring 2.8 x 1.8 cm (previously 2.6 x 1.9 cm on 01/06/2015) is unchanged within measurement air compared to numerous prior examinations dating back to at least 2015, presumably the patient's treated primary lesion. No new suspicious appearing pulmonary nodules or masses are otherwise noted. No acute consolidative airspace disease. Upper Abdomen: Diffuse low attenuation throughout the hepatic parenchyma, compatible with hepatic steatosis. Musculoskeletal/Soft Tissues: Innumerable sclerotic lesions are again noted throughout the visualized axial and appendicular skeleton, similar to prior examinations, compatible with widespread metastatic disease to the bones. No pathologic fractures identified in the visualized portions of the thoracic or lumbar spine at this time. IMPRESSION: 1. Stable post treatment related changes in the right hemithorax with chronic fibrothorax and stable treated lesion in the lower right lung, as above. No findings to suggest locally recurrent disease. 2. Diffuse metastatic disease to the bones, unchanged compared to prior examinations. 3. Large right-sided Morgagni hernia again noted. 4. Hepatic steatosis. Electronically Signed   By: DVinnie LangtonM.D.   On: 05/01/2015 11:39   ASSESSMENT AND PLAN: This is a very pleasant 55years old [place] female with:  1)  Metastatic  non-small cell lung cancer, adenocarcinoma with positive EGFR mutation in exon 19 currently undergoing systemic treatment with oral [name] status post 40 months of treatment. The patient is tolerating her treatment fairly well with no significant adverse effects. The recent CT scan of the chest, abdomen and pelvis showed no evidence for disease progression. I discussed the scan results with the patient today. I recommended for her to continue on [name] 150 mg by mouth daily. I will see her back for follow-up visit in 2 months for reevaluation and repeat blood work.  2) Metastatic bone disease:  the patient will continue on Xgeva 120 mg subcutaneously every 8 weeks.   3) For pain management, she will continue on the current pain medication with oxycodone.  4) anemia: I will start the patient on Integra plus 1 capsule by mouth daily  The patient voices understanding of current disease status and treatment options and is in agreement with the current care plan.  All questions were answered. The patient knows to call the  clinic with any problems, questions or concerns. We can certainly see the patient much sooner if necessary.  Disclaimer: This note was dictated with voice recognition software. Similar sounding words can inadvertently be transcribed and may be missed upon review.

## 2015-05-05 ENCOUNTER — Telehealth: Payer: Self-pay | Admitting: Medical Oncology

## 2015-05-05 ENCOUNTER — Telehealth: Payer: Self-pay | Admitting: Internal Medicine

## 2015-05-05 NOTE — Telephone Encounter (Signed)
Requests increase quantity of pain med from #90 to #100 because of increase in " arthritic bone pain " , She is taking  BC arthritis med and it is irritating her stomach. Note to Berkey.

## 2015-05-05 NOTE — Telephone Encounter (Signed)
Spoke with patient re appointments for lab/MM/inj 4/12.

## 2015-05-07 ENCOUNTER — Telehealth: Payer: Self-pay | Admitting: Medical Oncology

## 2015-05-07 NOTE — Telephone Encounter (Signed)
Pt notified that Beth [name] will not increase quantity of pain med. She told me that her pain is much better after getting the xgeva injection and she wants Beth [name] to know that.

## 2015-05-09 NOTE — Telephone Encounter (Signed)
On 04-17-2015 [name] refill was faxed with one additional refill which should last until June 15, 2015.  No refills due at this time.

## 2015-05-14 ENCOUNTER — Telehealth: Payer: Self-pay | Admitting: Medical Oncology

## 2015-05-14 NOTE — Telephone Encounter (Signed)
Pt notified to contact PCP for respimat.

## 2015-05-15 ENCOUNTER — Telehealth: Payer: Self-pay | Admitting: *Deleted

## 2015-05-15 ENCOUNTER — Other Ambulatory Visit: Payer: Self-pay | Admitting: Medical Oncology

## 2015-05-15 DIAGNOSIS — C3490 Malignant neoplasm of unspecified part of unspecified bronchus or lung: Secondary | ICD-10-CM

## 2015-05-15 MED ORDER — OXYCODONE HCL 10 MG PO TABS: 10.0000 mg | ORAL_TABLET | Freq: Four times a day (QID) | ORAL | Status: DC | PRN

## 2015-05-15 MED FILL — oxyCODONE HCL 10 MG TABS: 10 | 22 days supply | Qty: 90 | Fill #0

## 2015-05-15 NOTE — Progress Notes (Signed)
Pt notified rx is ready for pick up . Locked up rx.

## 2015-05-15 NOTE — Telephone Encounter (Signed)
TC from patient requesting refill on her Oxycodone 10 mg tablets. Last filled on 04/21/15 for 90 tablets.  Please call patient @ [phone]  when prescription is available for pickup. She will need it before the weekend.

## 2015-06-03 ENCOUNTER — Telehealth: Payer: Self-pay | Admitting: *Deleted

## 2015-06-03 NOTE — Telephone Encounter (Signed)
TC from patient requesting refill of her oxycodone 10 mg tabs. Last filled on 05/15/15 for 90 tablets. Pt wants to pick to Monday, June 09, 2015  Please call pt when prescription is ready for pick up.

## 2015-06-06 ENCOUNTER — Other Ambulatory Visit: Payer: Self-pay | Admitting: Medical Oncology

## 2015-06-06 DIAGNOSIS — C3490 Malignant neoplasm of unspecified part of unspecified bronchus or lung: Secondary | ICD-10-CM

## 2015-06-06 MED ORDER — OXYCODONE HCL 10 MG PO TABS: 10.0000 mg | ORAL_TABLET | Freq: Four times a day (QID) | ORAL | Status: DC | PRN

## 2015-06-06 MED FILL — oxyCODONE HCL 10 MG TABS: 10 | 22 days supply | Qty: 90 | Fill #0

## 2015-06-06 NOTE — Progress Notes (Signed)
Oxycodone rx locked up for pick up Monday.

## 2015-06-10 ENCOUNTER — Telehealth: Payer: Self-pay | Admitting: Family Medicine

## 2015-06-10 DIAGNOSIS — C3490 Malignant neoplasm of unspecified part of unspecified bronchus or lung: Secondary | ICD-10-CM

## 2015-06-10 NOTE — Telephone Encounter (Signed)
Pt request refill  Ipratropium-Albuterol (COMBIVENT) 20-100 MCG/ACT AERS respimat  Dr Curt Bears, MD had prescribed this once, but advised pt to   have her PCP refill this.  Pt is out and would like to have refilled asap/ Pt aware Dr Elease Hashimoto is out for the afternoon.  [facility] [address]

## 2015-06-11 MED ORDER — IPRATROPIUM-ALBUTEROL 20-100 MCG/ACT IN AERS: 1.0000 | INHALATION_SPRAY | Freq: Four times a day (QID) | RESPIRATORY_TRACT | Status: DC | PRN

## 2015-06-11 NOTE — Telephone Encounter (Signed)
Medication has been sent in for patient.

## 2015-06-25 ENCOUNTER — Other Ambulatory Visit: Payer: Self-pay | Admitting: Medical Oncology

## 2015-06-25 DIAGNOSIS — C3490 Malignant neoplasm of unspecified part of unspecified bronchus or lung: Secondary | ICD-10-CM

## 2015-06-25 MED ORDER — ERLOTINIB HCL 150 MG PO TABS: 150.0000 mg | ORAL_TABLET | Freq: Every day | ORAL | Status: DC

## 2015-06-27 ENCOUNTER — Other Ambulatory Visit: Payer: Self-pay | Admitting: *Deleted

## 2015-06-27 DIAGNOSIS — C3490 Malignant neoplasm of unspecified part of unspecified bronchus or lung: Secondary | ICD-10-CM

## 2015-06-27 MED ORDER — ERLOTINIB HCL 150 MG PO TABS: 150.0000 mg | ORAL_TABLET | Freq: Every day | ORAL | Status: DC

## 2015-06-30 ENCOUNTER — Telehealth: Payer: Self-pay | Admitting: *Deleted

## 2015-06-30 NOTE — Telephone Encounter (Signed)
Pt called to say she needs to reschedule her appt with Dr Julien Nordmann from Wednesday, 4/12 to Monday, 4/17 anytime or Tuesday, 4/18 anytime

## 2015-06-30 NOTE — Telephone Encounter (Signed)
[facility] if I have availability.

## 2015-07-01 ENCOUNTER — Telehealth: Payer: Self-pay | Admitting: Internal Medicine

## 2015-07-01 NOTE — Telephone Encounter (Signed)
I cc this to the scheduler to make the change

## 2015-07-01 NOTE — Telephone Encounter (Signed)
pt called to r/s appt...done....pt ok and aware of new d.t °

## 2015-07-02 ENCOUNTER — Ambulatory Visit: Payer: Medicare Other

## 2015-07-02 ENCOUNTER — Other Ambulatory Visit: Payer: Medicare Other

## 2015-07-02 ENCOUNTER — Ambulatory Visit: Payer: Medicare Other | Admitting: Internal Medicine

## 2015-07-03 ENCOUNTER — Other Ambulatory Visit: Payer: Self-pay | Admitting: Medical Oncology

## 2015-07-03 DIAGNOSIS — C3490 Malignant neoplasm of unspecified part of unspecified bronchus or lung: Secondary | ICD-10-CM

## 2015-07-03 MED ORDER — OXYCODONE HCL 10 MG PO TABS: 10.0000 mg | ORAL_TABLET | Freq: Four times a day (QID) | ORAL | Status: DC | PRN

## 2015-07-03 MED FILL — oxyCODONE HCL 10 MG TABS: 10 | 22 days supply | Qty: 90 | Fill #0

## 2015-07-03 NOTE — Progress Notes (Signed)
rx ready for pick up -Pt aware.

## 2015-07-07 ENCOUNTER — Telehealth: Payer: Self-pay | Admitting: Internal Medicine

## 2015-07-07 ENCOUNTER — Telehealth: Payer: Self-pay | Admitting: *Deleted

## 2015-07-07 ENCOUNTER — Ambulatory Visit: Payer: Medicare Other | Admitting: Internal Medicine

## 2015-07-07 ENCOUNTER — Ambulatory Visit: Payer: Medicare Other

## 2015-07-07 ENCOUNTER — Other Ambulatory Visit: Payer: Medicare Other
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
## 2015-07-07 NOTE — Telephone Encounter (Signed)
pt called to r/s missed appt....done ....pt ok and aware of new d.t °

## 2015-07-10 ENCOUNTER — Other Ambulatory Visit: Payer: Self-pay | Admitting: Medical Oncology

## 2015-07-10 DIAGNOSIS — C3491 Malignant neoplasm of unspecified part of right bronchus or lung: Secondary | ICD-10-CM

## 2015-07-10 DIAGNOSIS — C3410 Malignant neoplasm of upper lobe, unspecified bronchus or lung: Secondary | ICD-10-CM

## 2015-07-10 MED ORDER — TEMAZEPAM 15 MG PO CAPS: ORAL_CAPSULE | ORAL | Status: DC

## 2015-07-10 NOTE — Progress Notes (Signed)
Faxed temazepam refill.

## 2015-07-14 ENCOUNTER — Encounter: Payer: Self-pay | Admitting: Medical Oncology

## 2015-07-14 NOTE — Progress Notes (Signed)
Dr Julien Nordmann will discuss temazepam refills at appt on wed

## 2015-07-16 ENCOUNTER — Ambulatory Visit (HOSPITAL_BASED_OUTPATIENT_CLINIC_OR_DEPARTMENT_OTHER): Payer: Medicare Other | Admitting: Internal Medicine

## 2015-07-16 ENCOUNTER — Ambulatory Visit (HOSPITAL_BASED_OUTPATIENT_CLINIC_OR_DEPARTMENT_OTHER): Payer: Medicare Other

## 2015-07-16 ENCOUNTER — Other Ambulatory Visit (HOSPITAL_BASED_OUTPATIENT_CLINIC_OR_DEPARTMENT_OTHER): Payer: Medicare Other

## 2015-07-16 ENCOUNTER — Encounter: Payer: Self-pay | Admitting: Internal Medicine

## 2015-07-16 ENCOUNTER — Telehealth: Payer: Self-pay | Admitting: Internal Medicine

## 2015-07-16 ENCOUNTER — Other Ambulatory Visit: Payer: Self-pay | Admitting: Medical Oncology

## 2015-07-16 VITALS — BP 141/99 | HR 98 | Temp 99.7°F | Resp 19 | Ht 67.0 in | Wt 269.9 lb

## 2015-07-16 DIAGNOSIS — C3491 Malignant neoplasm of unspecified part of right bronchus or lung: Secondary | ICD-10-CM

## 2015-07-16 DIAGNOSIS — D649 Anemia, unspecified: Secondary | ICD-10-CM | POA: Diagnosis not present

## 2015-07-16 DIAGNOSIS — C7951 Secondary malignant neoplasm of bone: Secondary | ICD-10-CM

## 2015-07-16 DIAGNOSIS — Z5111 Encounter for antineoplastic chemotherapy: Secondary | ICD-10-CM

## 2015-07-16 DIAGNOSIS — G893 Neoplasm related pain (acute) (chronic): Secondary | ICD-10-CM | POA: Diagnosis not present

## 2015-07-16 DIAGNOSIS — G4700 Insomnia, unspecified: Secondary | ICD-10-CM

## 2015-07-16 DIAGNOSIS — C3410 Malignant neoplasm of upper lobe, unspecified bronchus or lung: Secondary | ICD-10-CM

## 2015-07-16 LAB — COMPREHENSIVE METABOLIC PANEL
ALBUMIN: 3.7 g/dL (ref 3.5–5.0)
ALT: 14 U/L (ref 0–55)
AST: 19 U/L (ref 5–34)
Alkaline Phosphatase: 68 U/L (ref 40–150)
Anion Gap: 8 mEq/L (ref 3–11)
BUN: 14.8 mg/dL (ref 7.0–26.0)
CALCIUM: 9.2 mg/dL (ref 8.4–10.4)
CO2: 27 mEq/L (ref 22–29)
CREATININE: 0.8 mg/dL (ref 0.6–1.1)
Chloride: 107 mEq/L (ref 98–109)
EGFR: >90 mL/min/{1.73_m2} (ref >90)
Glucose: 95 mg/dl (ref 70–140)
Potassium: 3.6 mEq/L (ref 3.5–5.1)
Sodium: 141 mEq/L (ref 136–145)
TOTAL PROTEIN: 7.6 g/dL (ref 6.4–8.3)
Total Bilirubin: 0.66 mg/dL (ref 0.20–1.20)

## 2015-07-16 LAB — CBC WITH DIFFERENTIAL/PLATELET
BASO%: 0.2 % (ref 0.0–2.0)
Basophils Absolute: 0 10*3/uL (ref 0.0–0.1)
EOS%: 2.4 % (ref 0.0–7.0)
Eosinophils Absolute: 0.1 10*3/uL (ref 0.0–0.5)
HEMATOCRIT: 36.7 % (ref 34.8–46.6)
HEMOGLOBIN: 12 g/dL (ref 11.6–15.9)
LYMPH#: 1.9 10*3/uL (ref 0.9–3.3)
LYMPH%: 38.7 % (ref 14.0–49.7)
MCH: 30.5 pg (ref 25.1–34.0)
MCHC: 32.7 g/dL (ref 31.5–36.0)
MCV: 93.1 fL (ref 79.5–101.0)
MONO#: 0.4 10*3/uL (ref 0.1–0.9)
MONO%: 8.6 % (ref 0.0–14.0)
NEUT#: 2.5 10*3/uL (ref 1.5–6.5)
NEUT%: 50.1 % (ref 38.4–76.8)
Platelets: 238 10*3/uL (ref 145–400)
RBC: 3.94 10*6/uL (ref 3.70–5.45)
RDW: 14.4 % (ref 11.2–14.5)
WBC: 5 10*3/uL (ref 3.9–10.3)

## 2015-07-16 MED ORDER — TEMAZEPAM 15 MG PO CAPS: 15.0000 mg | ORAL_CAPSULE | Freq: Every evening | ORAL | Status: DC | PRN

## 2015-07-16 MED ORDER — DENOSUMAB 120 MG/1.7ML ~~LOC~~ SOLN
120.0000 mg | Freq: Once | SUBCUTANEOUS | Status: AC
  Administered 2015-07-16: 120 mg via SUBCUTANEOUS
  Filled 2015-07-16: qty 1.7

## 2015-07-16 NOTE — Telephone Encounter (Signed)
Gave and printed appt sched and avs for pt for May and June  °
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
## 2015-07-28 ENCOUNTER — Other Ambulatory Visit: Payer: Self-pay | Admitting: *Deleted

## 2015-07-28 DIAGNOSIS — C3490 Malignant neoplasm of unspecified part of unspecified bronchus or lung: Secondary | ICD-10-CM

## 2015-07-28 MED ORDER — OXYCODONE HCL 10 MG PO TABS: 10.0000 mg | ORAL_TABLET | Freq: Four times a day (QID) | ORAL | Status: DC | PRN

## 2015-07-28 MED FILL — oxyCODONE HCL 10 MG TABS: 10 | 22 days supply | Qty: 90 | Fill #0

## 2015-07-28 NOTE — Telephone Encounter (Signed)
Pt called with request for Refill pain medication. Reviewed with MD.

## 2015-07-31 ENCOUNTER — Other Ambulatory Visit: Payer: Self-pay | Admitting: Medical Oncology

## 2015-07-31 DIAGNOSIS — C3490 Malignant neoplasm of unspecified part of unspecified bronchus or lung: Secondary | ICD-10-CM

## 2015-07-31 MED ORDER — ERLOTINIB HCL 150 MG PO TABS: 150.0000 mg | ORAL_TABLET | Freq: Every day | ORAL | Status: DC

## 2015-08-04 ENCOUNTER — Ambulatory Visit (INDEPENDENT_AMBULATORY_CARE_PROVIDER_SITE_OTHER): Payer: Medicare Other | Admitting: Family Medicine

## 2015-08-04 VITALS — BP 130/100 | HR 68 | Temp 99.4°F | Ht 67.0 in | Wt 267.0 lb

## 2015-08-04 DIAGNOSIS — R05 Cough: Secondary | ICD-10-CM | POA: Diagnosis not present

## 2015-08-04 DIAGNOSIS — R059 Cough, unspecified: Secondary | ICD-10-CM

## 2015-08-04 MED ORDER — LEVOFLOXACIN 500 MG PO TABS: 500.0000 mg | ORAL_TABLET | Freq: Every day | ORAL | Status: DC

## 2015-08-04 MED ORDER — PREDNISONE 10 MG PO TABS
ORAL_TABLET | ORAL | Status: DC
Start: 2015-08-04 — End: 2015-08-12

## 2015-08-04 NOTE — Patient Instructions (Signed)
Follow up for any fever or increased shortness of breath Touch base if cough not better in one week.

## 2015-08-04 NOTE — Progress Notes (Signed)
Pre visit review using our clinic review tool, if applicable. No additional management support is needed unless otherwise documented below in the visit note. 

## 2015-08-04 NOTE — Progress Notes (Signed)
   Subjective:    Patient ID: Beth [name], female    DOB: 01-Nov-1960, 55 y.o.   MRN: [id number]  HPI  Patient has stage IV adenocarcinoma of the lung and is seen with approximately one month history of mostly dry cough. She had CT scan of the chest February 2017 with no progressive disease. She has metastatic disease which is unchanged. She's had occasional chills but no documented fever. No hemoptysis. Occasional postnasal drip. No GERD symptoms. No increased dyspnea.  No alleviating factors. She feels she is wheezing some at night.   She had recent CBC and comprehensive metabolic panel. These were reviewed with patient and normal.  Past Medical History  Diagnosis Date  . Asthma   . GERD (gastroesophageal reflux disease)   . Hypertension   . Heart murmur   . Fibromyalgia   . Arthritis of knee, degenerative   . Endocarditis     as teenager  . Complication of anesthesia     difficulty waking up  . Shortness of breath   . Lung mass     R- ADENOCARCINOMA  . Sleep apnea     no longer using CPAP  . Pleural effusion 08/30/11  . Anxiety   . Depression   . History of radiation therapy 09/29/11-11/04/2011    right lung 2700cGy 15 sessions  . Osseous metastasis ([place]) 09/20/11    per PET scan   Past Surgical History  Procedure Laterality Date  . Tubal ligation  2002  . Thoracentesis  09/02/11, 09/09/11    right-side pleural effusion    reports that she quit smoking about 4 years ago. Her smoking use included Cigarettes. She has a 7.5 pack-year smoking history. She does not have any smokeless tobacco history on file. She reports that she does not drink alcohol or use illicit drugs. family history includes Alcohol abuse in her father; Hypertension in her maternal aunt, maternal grandfather, maternal grandmother, maternal uncle, and mother; Sarcoidosis in her sister. Allergies  Allergen Reactions  . Meloxicam     Possible GI bleed      Review of Systems  Constitutional: Positive for  chills and fatigue. Negative for fever.  HENT: Positive for postnasal drip.   Respiratory: Positive for cough and wheezing. Negative for shortness of breath.   Cardiovascular: Negative for chest pain.       Objective:   Physical Exam  Constitutional: She appears well-developed and well-nourished.  HENT:  Right Ear: External ear normal.  Left Ear: External ear normal.  Mouth/Throat: Oropharynx is clear and moist.  Neck: Neck supple.  Cardiovascular: Normal rate and regular rhythm.   Pulmonary/Chest: Effort normal and breath sounds normal. No respiratory distress. She has no wheezes. She has no rales.  Lymphadenopathy:    She has no cervical adenopathy.          Assessment & Plan:   Cough. Patient has stage IV adenocarcinoma lung. Even though this may all be viral, with her underlying condition we'll go ahead and treat with Levaquin 500 milligrams once daily and prednisone taper over the next week. Consider chest x-ray and further evaluation if not improving with the above. Follow-up immediately for any increased fever or shortness of breath  [name] MD [facility]

## 2015-08-05 ENCOUNTER — Telehealth: Payer: Self-pay | Admitting: Family Medicine

## 2015-08-05 ENCOUNTER — Telehealth: Payer: Self-pay | Admitting: *Deleted

## 2015-08-05 DIAGNOSIS — C3490 Malignant neoplasm of unspecified part of unspecified bronchus or lung: Secondary | ICD-10-CM

## 2015-08-05 MED ORDER — VENLAFAXINE HCL ER 75 MG PO TB24: 75.0000 mg | ORAL_TABLET | Freq: Once | ORAL | Status: DC

## 2015-08-05 NOTE — Telephone Encounter (Signed)
We can try reducing Effexor XR down to 75 mg. Make sure we take 150 mg dose off her med list so there's no confusion regarding dosing.

## 2015-08-05 NOTE — Telephone Encounter (Signed)
Lower dose of medication sent in for patient.

## 2015-08-05 NOTE — Telephone Encounter (Signed)
Pt called [facility], " I've been having some shortness of breath and a cough. I'd like to make sure everything is okay, could I have a CT scan?" Will review with MD and call pt with additional information.

## 2015-08-05 NOTE — Addendum Note (Signed)
Addended by: Lucile Crater on: 08/05/2015 04:05 PM   Modules accepted: Orders

## 2015-08-05 NOTE — Telephone Encounter (Signed)
Pt would like to lower dose of effexor -xr 150 mg to 75 mg# 30. [facility]

## 2015-08-05 NOTE — Telephone Encounter (Signed)
"  I called called earlier today.  Please call me back.  [phone]."

## 2015-08-05 NOTE — Telephone Encounter (Addendum)
Reviewed pt concerns with MD who has ordered a CT Chest/Abdomen/Pelvis for pt, depending on results we can move pt's MD appt up from 6/22 to a sooner date. Called pt notified CT will be scheduled. Pt advised she is unable to drink the contrast and usually only gets the IV contrast. Pt requested MD advise if she has to have contrast or can she have it without.

## 2015-08-05 NOTE — Telephone Encounter (Signed)
Informed pt contrast gives [place] a better picture of what is being scanned. Pt advised she will call to schedule her CT.

## 2015-08-05 NOTE — Telephone Encounter (Signed)
Please advise on medication change

## 2015-08-06 ENCOUNTER — Telehealth: Payer: Self-pay | Admitting: Internal Medicine

## 2015-08-06 NOTE — Telephone Encounter (Signed)
returned call and lvm for pt regarding to MD 1st available appt....advised pt to call back to confirm appt

## 2015-08-07 ENCOUNTER — Ambulatory Visit (HOSPITAL_BASED_OUTPATIENT_CLINIC_OR_DEPARTMENT_OTHER): Payer: Medicare Other

## 2015-08-07 ENCOUNTER — Telehealth: Payer: Self-pay | Admitting: Medical Oncology

## 2015-08-07 ENCOUNTER — Ambulatory Visit (HOSPITAL_COMMUNITY)
Admission: RE | Admit: 2015-08-07 | Discharge: 2015-08-07 | Disposition: A | Discharge status: Home / Self Care | Payer: Medicare Other | Source: Ambulatory Visit | Attending: Internal Medicine | Admitting: Internal Medicine

## 2015-08-07 ENCOUNTER — Encounter (HOSPITAL_COMMUNITY): Payer: Self-pay

## 2015-08-07 ENCOUNTER — Other Ambulatory Visit: Payer: Self-pay | Admitting: Medical Oncology

## 2015-08-07 DIAGNOSIS — I2699 Other pulmonary embolism without acute cor pulmonale: Secondary | ICD-10-CM

## 2015-08-07 DIAGNOSIS — C3490 Malignant neoplasm of unspecified part of unspecified bronchus or lung: Secondary | ICD-10-CM

## 2015-08-07 DIAGNOSIS — R918 Other nonspecific abnormal finding of lung field: Secondary | ICD-10-CM | POA: Insufficient documentation

## 2015-08-07 DIAGNOSIS — C7951 Secondary malignant neoplasm of bone: Secondary | ICD-10-CM | POA: Diagnosis not present

## 2015-08-07 MED ORDER — ENOXAPARIN SODIUM 300 MG/3ML IJ SOLN
180.0000 mg | Freq: Once | INTRAMUSCULAR | Status: AC
  Administered 2015-08-07: 180 mg via SUBCUTANEOUS
  Filled 2015-08-07: qty 1.8

## 2015-08-07 MED ORDER — DIATRIZOATE MEGLUMINE & SODIUM 66-10 % PO SOLN
15.0000 mL | Freq: Once | ORAL | Status: AC
  Administered 2015-08-07: 15 mL via ORAL

## 2015-08-07 MED ORDER — IOPAMIDOL (ISOVUE-300) INJECTION 61%
100.0000 mL | Freq: Once | INTRAVENOUS | Status: AC | PRN
  Administered 2015-08-07: 100 mL via INTRAVENOUS

## 2015-08-07 NOTE — Telephone Encounter (Signed)
PT called CT angio ordered.

## 2015-08-07 NOTE — Telephone Encounter (Signed)
Pt coming in for lovenox injection. I gave her appt for CT angio scan for tomorrow.

## 2015-08-07 NOTE — Telephone Encounter (Signed)
asking for result of Ct scan. Note to [place].

## 2015-08-08 ENCOUNTER — Telehealth: Payer: Self-pay | Admitting: Family Medicine

## 2015-08-08 ENCOUNTER — Telehealth: Payer: Self-pay | Admitting: *Deleted

## 2015-08-08 ENCOUNTER — Ambulatory Visit (HOSPITAL_COMMUNITY)
Admission: RE | Admit: 2015-08-08 | Discharge: 2015-08-08 | Disposition: A | Discharge status: Home / Self Care | Payer: Medicare Other | Source: Ambulatory Visit | Attending: Internal Medicine | Admitting: Internal Medicine

## 2015-08-08 DIAGNOSIS — C3490 Malignant neoplasm of unspecified part of unspecified bronchus or lung: Secondary | ICD-10-CM | POA: Diagnosis not present

## 2015-08-08 DIAGNOSIS — R059 Cough, unspecified: Secondary | ICD-10-CM

## 2015-08-08 DIAGNOSIS — R0602 Shortness of breath: Secondary | ICD-10-CM | POA: Insufficient documentation

## 2015-08-08 DIAGNOSIS — R079 Chest pain, unspecified: Secondary | ICD-10-CM | POA: Insufficient documentation

## 2015-08-08 DIAGNOSIS — R05 Cough: Secondary | ICD-10-CM

## 2015-08-08 MED ORDER — IOPAMIDOL (ISOVUE-370) INJECTION 76%
100.0000 mL | Freq: Once | INTRAVENOUS | Status: AC | PRN
  Administered 2015-08-08: 67 mL via INTRAVENOUS

## 2015-08-08 NOTE — Telephone Encounter (Signed)
Per Julien Nordmann , I told pt that  her CT angio did not identify an embous in the area of concern on CT chest . She is asking me why is she so short of breath . Upon further questioning she said Dr Elease Hashimoto prescribed her an antibiotic that she started on Monday and she will f/u with him. I told her that was a good plan and if symptoms worsen go to ED.

## 2015-08-08 NOTE — Telephone Encounter (Signed)
FYI: Pt is aware that xray has been ordered. Ordered per OV note.

## 2015-08-08 NOTE — Telephone Encounter (Signed)
Pt states if she was not better , dr Elease Hashimoto wanted her to have an Xray before she came back in. Pt has appointment on Tuesday, and hopes Dr Elease Hashimoto will put in an order for an Xray and she will go Monday

## 2015-08-08 NOTE — Telephone Encounter (Signed)
Call from [facility] CT, CT angio negative for PE.

## 2015-08-08 NOTE — Telephone Encounter (Signed)
Patient called wanting to speak with Dr. Worthy Flank nurse. Wants to know why I'm so short of breath. If unable to reach her at home please call cell [phone].

## 2015-08-08 NOTE — Telephone Encounter (Signed)
Please advise if chest

## 2015-08-09 ENCOUNTER — Encounter (HOSPITAL_COMMUNITY): Payer: Self-pay

## 2015-08-09 ENCOUNTER — Emergency Department (HOSPITAL_COMMUNITY)
Admission: EM | Admit: 2015-08-09 | Discharge: 2015-08-09 | Discharge status: Against Medical Advice | Payer: Medicare Other | Attending: Emergency Medicine | Admitting: Emergency Medicine

## 2015-08-09 DIAGNOSIS — Z87891 Personal history of nicotine dependence: Secondary | ICD-10-CM | POA: Diagnosis not present

## 2015-08-09 DIAGNOSIS — R05 Cough: Secondary | ICD-10-CM | POA: Diagnosis present

## 2015-08-09 DIAGNOSIS — F329 Major depressive disorder, single episode, unspecified: Secondary | ICD-10-CM | POA: Diagnosis not present

## 2015-08-09 DIAGNOSIS — R011 Cardiac murmur, unspecified: Secondary | ICD-10-CM | POA: Insufficient documentation

## 2015-08-09 DIAGNOSIS — Z7951 Long term (current) use of inhaled steroids: Secondary | ICD-10-CM | POA: Insufficient documentation

## 2015-08-09 DIAGNOSIS — J45901 Unspecified asthma with (acute) exacerbation: Secondary | ICD-10-CM | POA: Diagnosis not present

## 2015-08-09 DIAGNOSIS — R0602 Shortness of breath: Secondary | ICD-10-CM

## 2015-08-09 DIAGNOSIS — R008 Other abnormalities of heart beat: Secondary | ICD-10-CM | POA: Diagnosis not present

## 2015-08-09 DIAGNOSIS — F419 Anxiety disorder, unspecified: Secondary | ICD-10-CM | POA: Insufficient documentation

## 2015-08-09 DIAGNOSIS — M797 Fibromyalgia: Secondary | ICD-10-CM | POA: Diagnosis not present

## 2015-08-09 DIAGNOSIS — Z923 Personal history of irradiation: Secondary | ICD-10-CM | POA: Diagnosis not present

## 2015-08-09 DIAGNOSIS — I10 Essential (primary) hypertension: Secondary | ICD-10-CM | POA: Insufficient documentation

## 2015-08-09 DIAGNOSIS — R1013 Epigastric pain: Secondary | ICD-10-CM | POA: Diagnosis not present

## 2015-08-09 DIAGNOSIS — Z792 Long term (current) use of antibiotics: Secondary | ICD-10-CM | POA: Insufficient documentation

## 2015-08-09 DIAGNOSIS — Z79899 Other long term (current) drug therapy: Secondary | ICD-10-CM | POA: Diagnosis not present

## 2015-08-09 LAB — CBC WITH DIFFERENTIAL/PLATELET
Basophils Absolute: 0 10*3/uL (ref 0.0–0.1)
Basophils Relative: 0 %
Eosinophils Absolute: 0.1 10*3/uL (ref 0.0–0.7)
Eosinophils Relative: 1 %
HCT: 35 % — ABNORMAL LOW (ref 36.0–46.0)
Hemoglobin: 11.2 g/dL — ABNORMAL LOW (ref 12.0–15.0)
Lymphocytes Relative: 21 %
Lymphs Abs: 2.1 10*3/uL (ref 0.7–4.0)
MCH: 29.6 pg (ref 26.0–34.0)
MCHC: 32 g/dL (ref 30.0–36.0)
MCV: 92.6 fL (ref 78.0–100.0)
Monocytes Absolute: 0.6 10*3/uL (ref 0.1–1.0)
Monocytes Relative: 7 %
Neutro Abs: 7.1 10*3/uL (ref 1.7–7.7)
Neutrophils Relative %: 71 %
Platelets: 300 10*3/uL (ref 150–400)
RBC: 3.78 MIL/uL — ABNORMAL LOW (ref 3.87–5.11)
RDW: 14 % (ref 11.5–15.5)
WBC: 9.9 10*3/uL (ref 4.0–10.5)

## 2015-08-09 LAB — BASIC METABOLIC PANEL
ANION GAP: 10 (ref 5–15)
BUN: 15 mg/dL (ref 6–20)
CALCIUM: 8.9 mg/dL (ref 8.9–10.3)
CO2: 29 mmol/L (ref 22–32)
Chloride: 103 mmol/L (ref 101–111)
Creatinine, Ser: 0.87 mg/dL (ref 0.44–1.00)
Glucose, Bld: 110 mg/dL — ABNORMAL HIGH (ref 65–99)
Potassium: 3.6 mmol/L (ref 3.5–5.1)
Sodium: 142 mmol/L (ref 135–145)

## 2015-08-09 LAB — TSH: TSH: 0.378 u[IU]/mL (ref 0.350–4.500)

## 2015-08-09 LAB — BRAIN NATRIURETIC PEPTIDE: B Natriuretic Peptide: 96.4 pg/mL (ref 0.0–100.0)

## 2015-08-09 MED ORDER — PREDNISONE 20 MG PO TABS: 20.0000 mg | ORAL_TABLET | Freq: Every day | ORAL | Status: DC

## 2015-08-09 NOTE — Discharge Instructions (Signed)

## 2015-08-09 NOTE — ED Notes (Addendum)
Patient here with increased congestion and cough for the past month. Reports that she has taken steroids this past week for same and not feeling better. Stage 4 lung cancer patient and had CT yesterday and no PE noted. Patient thinks all sinus related, no distress

## 2015-08-09 NOTE — ED Provider Notes (Signed)
CSN: [id number]     Arrival date & time 08/09/15  1014 History   First MD Initiated Contact with Patient 08/09/15 1126     Chief Complaint  Patient presents with  . Cough   HPI Comments: 55 year old female presents with worsening shortness of breath for the past week. 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 Julien Nordmann), asthma, GERD, heart murmur, HTN, anxiety/depression, remote hx of endocarditis, seasonal allergies. She was seen and evaluated by her PCP on the 5/15 who placed her on Levaquin and prednisone taper. She states the prednisone did help her symptoms however she has finished the course and her symptoms aren't getting better. She went to [facility] on 5/18 and 5/19 for CT scans. CT of the abdomen and pelvis was negative. CT of the chest and CTA were negative for acute changes or PE. She reports associated chills, diaphoresis, scratchy throat, sneezing, congestion, wheezing, dry cough. Denies fever, chest pain, abdominal pain, nausea, vomiting, diarrhea, dysuria. She has been taking Benadryl daily for allergy symptoms with no relief.   Patient is a 55 y.o. female presenting with cough.  Cough Associated symptoms: chills, diaphoresis, rhinorrhea, shortness of breath, sore throat and wheezing   Associated symptoms: no chest pain, no ear pain and no fever     Past Medical History  Diagnosis Date  . Asthma   . GERD (gastroesophageal reflux disease)   . Hypertension   . Heart murmur   . Fibromyalgia   . Arthritis of knee, degenerative   . Endocarditis     as teenager  . Complication of anesthesia     difficulty waking up  . Shortness of breath   . Lung mass     R- ADENOCARCINOMA  . Sleep apnea     no longer using CPAP  . Pleural effusion 08/30/11  . Anxiety   . Depression   . History of radiation therapy 09/29/11-11/04/2011    right lung 2700cGy 15 sessions  . 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 Tubal ligation  2002  . Thoracentesis  09/02/11, 09/09/11    right-side pleural effusion   Family History  Problem Relation Age of Onset  . Hypertension Mother   . Alcohol abuse Father   . Hypertension Maternal Aunt   . Hypertension Maternal Uncle   . Hypertension Maternal Grandmother   . Hypertension Maternal Grandfather   . Sarcoidosis Sister    Social History  Substance Use Topics  . Smoking status: Former Smoker -- 0.50 packs/day for 15 years    Types: Cigarettes    Quit date: 01/01/2011  . Smokeless tobacco: None  . Alcohol Use: No     Comment: per H&P, used to drink alcohol regularly   OB History    No data available     Review of Systems  Constitutional: Positive for chills and diaphoresis. Negative for fever.  HENT: Positive for congestion, postnasal drip, rhinorrhea, sinus pressure, sneezing and sore throat. Negative for ear pain.   Eyes: Negative for pain and itching.  Respiratory: Positive for cough, shortness of breath and wheezing.   Cardiovascular: Negative for chest pain, palpitations and leg swelling.  Gastrointestinal: Negative for nausea, vomiting, abdominal pain and diarrhea.  Genitourinary: Negative for dysuria.  Allergic/Immunologic: Positive for environmental allergies.  All other systems reviewed and are negative.     Allergies  Meloxicam  Home Medications   Prior  to Admission medications   Medication Sig Start Date End Date Taking? Authorizing Provider  amLODipine (NORVASC) 5 MG tablet TK 1 T PO ONCE A DAY 03/24/15   Historical Provider, MD  ergocalciferol (VITAMIN D2) 50000 UNITS capsule Take 1 capsule (50,000 Units total) by mouth once a week. 11/14/14   [name], MD  erlotinib ([name]) 150 MG tablet Take 1 tablet (150 mg total) by mouth daily. Take on an empty stomach 1 hour before meals or 2 hours after. 07/31/15   Curt Bears, MD  FeFum-FePoly-FA-B Cmp-C-Biot (INTEGRA PLUS) CAPS Take 1 each by mouth daily.  05/01/15   Curt Bears, MD  fish oil-omega-3 fatty acids 1000 MG capsule Take 2 g by mouth daily.    Historical Provider, MD  fluconazole (DIFLUCAN) 150 MG tablet TK 1 T PO AS A SINGLE DOSE. MAY REPEAT IN 1 WEEK IF SYMPTOMS PERSIST 03/24/15   Historical Provider, MD  furosemide (LASIX) 20 MG tablet Take one daily as needed for severe edema. 11/06/14   [name], MD  guaiFENesin-codeine (ROBITUSSIN AC) 100-10 MG/5ML syrup Take 5 mLs by mouth 3 (three) times daily as needed for cough. 12/12/13   Curt Bears, MD  hydrochlorothiazide (HYDRODIURIL) 25 MG tablet TAKE 1 TABLET BY MOUTH DAILY    [name], MD  Ipratropium-Albuterol (COMBIVENT) 20-100 MCG/ACT AERS respimat Inhale 1 puff into the lungs every 6 (six) hours as needed for wheezing. 06/11/15   [name], MD  levofloxacin (LEVAQUIN) 500 MG tablet Take 1 tablet (500 mg total) by mouth daily. 08/04/15   [name], MD  loperamide (IMODIUM) 2 MG capsule Take 2 mg by mouth 4 (four) times daily as needed for diarrhea or loose stools.    Historical Provider, MD  metroNIDAZOLE (METROGEL) 0.75 % vaginal gel Place 1 Applicatorful vaginally 2 (two) times daily. 01/17/15   [name], MD  mometasone (NASONEX) 50 MCG/ACT nasal spray Place 2 sprays into the nose daily. 06/18/14   [name], MD  Multiple Vitamins-Minerals (MULTIVITAMIN WITH MINERALS) tablet Take 1 tablet by mouth daily.    Historical Provider, MD  mupirocin ointment (BACTROBAN) 2 % APPLY INTO EACH NOSTRIL TWICE DAILY 04/02/15   [name], MD  Oxycodone HCl 10 MG TABS Take 1 tablet (10 mg total) by mouth every 6 (six) hours as needed. 07/28/15   Curt Bears, MD  predniSONE (DELTASONE) 10 MG tablet Taper as follows: 4-4-3-3-2-2 08/04/15   [name], MD  prochlorperazine (COMPAZINE) 10 MG tablet Take 1 tablet (10 mg total) by mouth every 6 (six) hours as needed. 11/13/14   [name], MD  temazepam (RESTORIL) 15 MG capsule Take 1  capsule (15 mg total) by mouth at bedtime as needed for sleep. 07/16/15   Curt Bears, MD  Venlafaxine HCl 75 MG TB24 Take 1 tablet (75 mg total) by mouth once. 08/05/15   [name], MD   BP 113/67 mmHg  Pulse 51  Temp(Src) 98.1 F (36.7 C) (Oral)  Resp 16  Ht '5\' 5"'$  (1.651 m)  Wt 86.183 kg  BMI 31.62 kg/m2  SpO2 99%  LMP 09/28/2011   Physical Exam  Constitutional: She is oriented to person, place, and time. She appears well-developed and well-nourished. No distress.  HENT:  Head: Normocephalic and atraumatic.  Right Ear: Hearing, tympanic membrane, external ear and ear canal normal.  Left Ear: Hearing, tympanic membrane, external ear and ear canal normal.  Nose: No mucosal edema or rhinorrhea.  Mouth/Throat: Uvula is midline, oropharynx is clear and moist and mucous membranes are normal.  Eyes: Conjunctivae are normal. Pupils are equal, round, and reactive to light. Right eye exhibits no discharge. Left eye exhibits no discharge. No scleral icterus.  Neck: Normal range of motion.  Cardiovascular: Normal rate and regular rhythm.  Exam reveals gallop and S3. Exam reveals no friction rub.   No murmur heard. Pulmonary/Chest: Effort normal and breath sounds normal. No respiratory distress. She has no wheezes. She has no rales. She exhibits no tenderness.  Abdominal: Soft. Bowel sounds are normal. She exhibits no distension and no mass. There is tenderness. There is no rebound and no guarding.  Epigastric tenderness  Lymphadenopathy:    She has no cervical adenopathy.  Neurological: She is alert and oriented to person, place, and time.  Skin: Skin is warm and dry. She is not diaphoretic.  Psychiatric: Her speech is normal and behavior is normal. Thought content normal. Her mood appears anxious. Cognition and memory are normal. She expresses impulsivity.  Irritable     ED Course  Procedures (including critical care time) Labs Review Labs Reviewed  BASIC METABOLIC PANEL -  Abnormal; Notable for the following:    Glucose, Bld 110 (*)    All other components within normal limits  CBC WITH DIFFERENTIAL/PLATELET - Abnormal; Notable for the following:    RBC 3.78 (*)    Hemoglobin 11.2 (*)    HCT 35.0 (*)    All other components within normal limits  TSH  BRAIN NATRIURETIC PEPTIDE    Imaging Review Ct Angio Chest Pe W/cm &/or Wo Cm  08/08/2015  CLINICAL DATA:  Shortness of breath over 1 month, worsening. Stage IV lung cancer. Questionable pulmonary embolus in the left lower lobe on a none angiogram CT yesterday. Today' s exam is for confirmation and characterization of any pulmonary embolus. EXAM: CT ANGIOGRAPHY CHEST WITH CONTRAST TECHNIQUE: Multidetector CT imaging of the chest was performed using the standard protocol during bolus administration of intravenous contrast. Multiplanar CT image reconstructions and MIPs were obtained to evaluate the vascular anatomy. CONTRAST:  67 cc Isovue 370 COMPARISON:  08/07/2015 FINDINGS: Despite efforts by the technologist and patient, motion artifact is present on today's exam and could not be eliminated. This reduces exam sensitivity and specificity. Mediastinum/Nodes: Right hemithoracic volume loss with shift of cardiac and mediastinal structures to the right. Calcification along the posterior margin of the aortic valve. Cardiomegaly. Vague hypodensity in right upper lobe pulmonary artery, image 39/4, not appreciable yesterday, probably related to slow flow rather than embolus. In the area of concern from yesterday's exam, specifically in the vicinity of image 34 series 2 from that exam which corresponds approximately with image 135 of series 6 of the current exam, there is no filling defect in the left lower lobe pulmonary artery. 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 Lungs/Pleura: Stable extensive volume loss in the right hemithorax with nodular scarring inferiorly in the  right lung which is unchanged. Emphysema. Stable ground-glass opacities scattered in the left lung. Upper abdomen: Small central hypodense lesion in the liver measuring 6 mm on image 86 series 4, no change, technically nonspecific. Musculoskeletal: Diffuse sclerotic osseous metastatic disease, not appreciably changed. Review of the MIP images confirms the above findings. IMPRESSION: 1. No embolus is identified. Specifically in the area of concern in the left lower lobe, there is no filling defect today. The appearance of possible filling defect on the prior exam may have  been from slow flow or other vascular phenomenon. 2. In the right pulmonary artery, particularly in the upper and middle lobe, there is very vague hypodensity in the pulmonary arterial tree which was not readily apparent yesterday. I favor this is being from slow flow/vascular phenomenon probably related to hypoventilation of the right lung. I am very doubtful that this represents embolus. It does not have the sharply defined appearance than I would expect for acute embolus. 3. Exam is otherwise stable from yesterday. Electronically Signed   By: Van Clines M.D.   On: 08/08/2015 11:48   I have personally reviewed and evaluated these images and lab results as part of my medical decision-making.   EKG Interpretation None      MDM   Final diagnoses:  SOB (shortness of breath)    55 year old female presents with 1 week of shortness of breath. Vital signs are within normal limits and stable. She is extremely anxious. She does have an S3 gallop on physical exam. She has had multiple scans within the past 2 days which were unremarkable. Will not repeat any imaging today. All labs are unremarkable. After labs were drawn and pending patient is stating she needs to leave to take care of her dog at home. She is requesting prednisone for the next 3 days until she can see her PCP on Tuesday. Patient will sign out AMA.   We discussed the  nature and purpose, risks and benefits, as well as, the alternatives of treatment. Time was given to allow the opportunity to ask questions and consider their options, and after the discussion, the patient decided to refuse the offerred treatment. The patient was informed that refusal could lead to, but was not limited to, death, permanent disability, or severe pain. Prior to refusing, I determined that the patient had the capacity to make their decision and understood the consequences of that decision. After refusal, I made every reasonable opportunity to treat them to the best of my ability.  The patient was notified that they may return to the emergency department at any time for further treatment. Patient verbalized understanding and states she will return if she has to. Patient is NAD, non-toxic, with stable VS. Return precautions given.     Recardo Evangelist, PA-C 08/09/15 1554  Tanna Furry, MD 08/23/15 [phone]

## 2015-08-09 NOTE — ED Notes (Signed)
Pt requesting to leave AMA. Pt sts she must get home to her dog. Pt verbalizes understanding of risks of leaving AMA.

## 2015-08-11 ENCOUNTER — Telehealth: Payer: Self-pay | Admitting: *Deleted

## 2015-08-11 ENCOUNTER — Telehealth: Payer: Self-pay | Admitting: Family Medicine

## 2015-08-11 NOTE — Telephone Encounter (Signed)
TC from patient asking if she still needed to come for lab work on 08/13/15 as she was seen in the ED over the weekend for SOB and cough. Please advise.

## 2015-08-11 NOTE — Telephone Encounter (Signed)
Pt called back to ask about the lab results from ER. Pt would like a call back.  Also pt would like a refill of the predniSONE (DELTASONE) 20 MG tablet    (today) [facility] [address]  Pt cancelled her appointment for tomorrow. States she will reschedule if she needs to.

## 2015-08-11 NOTE — Telephone Encounter (Signed)
Her labs all looked stable.  They included a BNP to rule out any heart failure and that also was  Normal.

## 2015-08-11 NOTE — Telephone Encounter (Signed)
[facility] - Client [place] Patient Name: [facility] WALL Gender: Female DOB: 10/05/1948 Age: 55 Y 10 M 4 D Return Phone Number: [phone] (Primary), [phone] (Secondary) Address: City/State/Zip: [place] Client [facility] - Client Client Site [facility] - Night Physician Alysia Penna - MD Contact Type Call Who Is Calling Patient / Member / Family / Caregiver Call Type Triage / Clinical Caller Name Dennison Nancy Relationship To Patient Spouse Return Phone Number [phone] (Primary) Chief Complaint Heart palpitations or irregular heartbeat Reason for Call Symptomatic / Request for [place], Caller's wife is having rapid hear rate of 140. PreDisposition Go to ED Translation No Nurse Assessment Nurse: Georgina Peer, RN, Kendrick Fries Date/Time Eilene Ghazi Time): 08/10/2015 7:07:24 PM Confirm and document reason for call. If symptomatic, describe symptoms. You must click the next button to save text entered. ---Caller states his wife has been feeling a rapid heart rate since noon today & has taken 2 tablets of Diltiazem since then. Has the patient traveled out of the country within the last 30 days? ---No Does the patient have any new or worsening symptoms? ---Yes Will a triage be completed? ---Yes Related visit to physician within the last 2 weeks? ---No Does the PT have any chronic conditions? (i.e. diabetes, asthma, etc.) ---No Is this a behavioral health or substance abuse call? ---No Guidelines Guideline Title Affirmed Question Affirmed Notes Nurse Date/Time (Eastern Time) Heart Rate and Heartbeat Questions [1] Heart beating very rapidly (e.g., > 140 / minute) AND [2] present now (Exception: during exercise) Georgina Peer, RN, Kendrick Fries 08/10/2015 7:10:09 PM Disp. Time Eilene Ghazi Time) Disposition Final User 08/10/2015 7:12:09 PM  Go to ED Now Yes Georgina Peer, RN, Kendrick Fries PLEASE NOTE: All timestamps contained within this report are represented as [place] Standard Time. CONFIDENTIALTY NOTICE: This fax transmission is intended only for the addressee. It contains information that is legally privileged, confidential or otherwise protected from use or disclosure. If you are not the intended recipient, you are strictly prohibited from reviewing, disclosing, copying using or disseminating any of this information or taking any action in reliance on or regarding this information. If you have received this fax in error, please notify us immediately by telephone so that we can arrange for its return to [place]. Phone: [phone], Toll-Free: [phone], Fax: [phone] Page: 2 of 2 Call Id: [id number] Caller Understands: Yes Disagree/Comply: Comply Care Advice Given Per Guideline GO TO ED NOW: You need to be seen in the Emergency Department. Go to the ER at ___________ [place] now. Drive carefully. NOTE TO TRIAGER - DRIVING: * Another adult should drive. BRING MEDICINES: * Please bring a list of your current medicines when you go to the Emergency Department (ER). * It is also a good idea to bring the pill bottles too. This will help the doctor to make certain you are taking the right medicines and the right dose. CARE ADVICE given per Palpitations (Adult) guideline. Referrals [facility] - ED

## 2015-08-11 NOTE — Telephone Encounter (Signed)
They did labs in the ER. Okay to let her know of results over the phone or need an office visit for follow up. Not sure if we can do an ER follow up since she left AMA. Please advise.

## 2015-08-11 NOTE — Telephone Encounter (Signed)
Pt would like to have a call to discuss the lab results from the ER this past weekend.

## 2015-08-11 NOTE — Telephone Encounter (Signed)
Pt went to ED.  Left AMA but looks like given 3 days of prednisone.  Has appt to follow up on 08/12/15

## 2015-08-12 ENCOUNTER — Ambulatory Visit: Payer: Medicare Other | Admitting: Family Medicine

## 2015-08-12 ENCOUNTER — Other Ambulatory Visit: Payer: Self-pay | Admitting: Medical Oncology

## 2015-08-12 ENCOUNTER — Other Ambulatory Visit: Payer: Self-pay | Admitting: Family Medicine

## 2015-08-12 MED ORDER — PREDNISONE 10 MG PO TABS: ORAL_TABLET | ORAL | Status: DC

## 2015-08-12 NOTE — Telephone Encounter (Signed)
May call in prednisone 10 mg taper:4-4-3-3-2-2-1-1 #20

## 2015-08-12 NOTE — Telephone Encounter (Signed)
Pt is aware of results. She would like a refill on the Prednisone taper dose pack. Please advise.

## 2015-08-12 NOTE — Telephone Encounter (Signed)
Pt is aware of res

## 2015-08-12 NOTE — Telephone Encounter (Signed)
Refill sent to pharmacy.   

## 2015-08-12 NOTE — Telephone Encounter (Signed)
Pt calling to see if Rx is ready.

## 2015-08-13 ENCOUNTER — Encounter: Payer: Self-pay | Admitting: Internal Medicine

## 2015-08-13 ENCOUNTER — Telehealth: Payer: Self-pay | Admitting: Medical Oncology

## 2015-08-13 ENCOUNTER — Ambulatory Visit (HOSPITAL_BASED_OUTPATIENT_CLINIC_OR_DEPARTMENT_OTHER): Payer: Medicare Other | Admitting: Internal Medicine

## 2015-08-13 ENCOUNTER — Other Ambulatory Visit: Payer: Medicare Other

## 2015-08-13 ENCOUNTER — Telehealth: Payer: Self-pay | Admitting: Family Medicine

## 2015-08-13 ENCOUNTER — Other Ambulatory Visit: Payer: Self-pay | Admitting: Medical Oncology

## 2015-08-13 ENCOUNTER — Telehealth: Payer: Self-pay | Admitting: Internal Medicine

## 2015-08-13 ENCOUNTER — Ambulatory Visit: Payer: Medicare Other

## 2015-08-13 VITALS — BP 137/91 | HR 97 | Temp 98.3°F | Resp 19 | Ht 65.0 in | Wt 271.2 lb

## 2015-08-13 DIAGNOSIS — G4700 Insomnia, unspecified: Secondary | ICD-10-CM

## 2015-08-13 DIAGNOSIS — R530 Neoplastic (malignant) related fatigue: Secondary | ICD-10-CM | POA: Diagnosis not present

## 2015-08-13 DIAGNOSIS — C3491 Malignant neoplasm of unspecified part of right bronchus or lung: Secondary | ICD-10-CM

## 2015-08-13 DIAGNOSIS — Z5111 Encounter for antineoplastic chemotherapy: Secondary | ICD-10-CM

## 2015-08-13 DIAGNOSIS — D649 Anemia, unspecified: Secondary | ICD-10-CM | POA: Diagnosis not present

## 2015-08-13 DIAGNOSIS — G893 Neoplasm related pain (acute) (chronic): Secondary | ICD-10-CM | POA: Diagnosis not present

## 2015-08-13 DIAGNOSIS — C7951 Secondary malignant neoplasm of bone: Secondary | ICD-10-CM | POA: Diagnosis not present

## 2015-08-13 NOTE — Telephone Encounter (Signed)
Pt had questions about local providers for gastric bypass procedures.  I explained that I'm not sure that she would be a candidate with her lung cancer hx.  I did refer her to their web site and she will consider going to information meeting as a first step. Would also encourage her to discuss with her oncologist.

## 2015-08-13 NOTE — Telephone Encounter (Signed)
Per Julien Nordmann I called pt to come today for appt.

## 2015-08-13 NOTE — Progress Notes (Signed)
[place] Telephone:([phone]) [phone]   Fax:(336) [phone]  OFFICE PROGRESS NOTE  [name], MD [place] [address]  DIAGNOSIS AND STAGE: Metastatic non-small cell lung cancer, adenocarcinoma with positive EGFR mutation in exon 19 diagnosed in June of 2013   PRIOR THERAPY: palliative radiotherapy to the right lung under the care of Dr. Valere Dross expected to be completed on 10/29/2011.   CURRENT THERAPY:  1. [name] 150 mg by mouth daily started 10/11/2011. Status post 43 months of therapy 2. Xgeva 120 mg subcutaneously every 8 weeks.  CHEMOTHERAPY INTENT: Palliative  CURRENT # OF CHEMOTHERAPY CYCLES: 44 CURRENT ANTIEMETICS: Compazine  CURRENT SMOKING STATUS: Former smoker but quit.  ORAL CHEMOTHERAPY AND CONSENT: Oral [name]  CURRENT BISPHOSPHONATES USE: Xgeva  PAIN MANAGEMENT: well-controlled with oxycodone when necessary.  NARCOTICS INDUCED CONSTIPATION: None  LIVING WILL AND CODE STATUS: Full code.   INTERVAL HISTORY: Beth [name] 55 y.o. female returns to the clinic today for followup visit. The patient is feeling fine today with no specific complaints except for mild fatigue.  She has been complaining of increasing shortness of breath recently. She had repeat CT scan of the chest for restaging of her disease. The initial report showed groundglass opacities suspicious for inflammatory process but there was also concern about possibility of pulmonary embolism. The patient was given 1 dose of Lovenox at that time and repeat CT angiogram of the chest the next day showed no evidence of pulmonary embolism. Lovenox was discontinued. She was seen by her primary care physician and the patient was treated with a course of antibiotics and she is feeling much better today. She denied having any significant chest pain, shortness of breath except with exertion, cough or hemoptysis. She denied having any significant skin rash or diarrhea. The  patient denied having any significant weight loss or night sweats. She is tolerating her treatment with [name] fairly well. She had repeat CBC and comprehensive metabolic panel performed recently and she is here for evaluation and discussion of her lab results.  MEDICAL HISTORY: Past Medical History  Diagnosis Date  . Asthma   . GERD (gastroesophageal reflux disease)   . Hypertension   . Heart murmur   . Fibromyalgia   . Arthritis of knee, degenerative   . Endocarditis     as teenager  . Complication of anesthesia     difficulty waking up  . Shortness of breath   . Lung mass     R- ADENOCARCINOMA  . Sleep apnea     no longer using CPAP  . Pleural effusion 08/30/11  . Anxiety   . Depression   . History of radiation therapy 09/29/11-11/04/2011    right lung 2700cGy 15 sessions  . Osseous metastasis ([facility]) 09/20/11    per PET scan    ALLERGIES:  is allergic to meloxicam.  MEDICATIONS:  Current Outpatient Prescriptions  Medication Sig Dispense Refill  . amLODipine (NORVASC) 5 MG tablet TK 1 T PO ONCE A DAY  5  . ergocalciferol (VITAMIN D2) 50000 UNITS capsule Take 1 capsule (50,000 Units total) by mouth once a week. 12 capsule 1  . erlotinib ([name]) 150 MG tablet Take 1 tablet (150 mg total) by mouth daily. Take on an empty stomach 1 hour before meals or 2 hours after. 30 tablet 0  . FeFum-FePoly-FA-B Cmp-C-Biot (INTEGRA PLUS) CAPS Take 1 each by mouth daily. 30 capsule 3  . fish oil-omega-3 fatty acids 1000 MG capsule Take 2  g by mouth daily.    . fluconazole (DIFLUCAN) 150 MG tablet TK 1 T PO AS A SINGLE DOSE. MAY REPEAT IN 1 WEEK IF SYMPTOMS PERSIST  1  . furosemide (LASIX) 20 MG tablet Take one daily as needed for severe edema. 30 tablet 1  . guaiFENesin-codeine (ROBITUSSIN AC) 100-10 MG/5ML syrup Take 5 mLs by mouth 3 (three) times daily as needed for cough. 120 mL 0  . hydrochlorothiazide (HYDRODIURIL) 25 MG tablet TAKE 1 TABLET BY MOUTH DAILY 30 tablet 5  .  Ipratropium-Albuterol (COMBIVENT) 20-100 MCG/ACT AERS respimat Inhale 1 puff into the lungs every 6 (six) hours as needed for wheezing. 4 g 2  . levofloxacin (LEVAQUIN) 500 MG tablet Take 1 tablet (500 mg total) by mouth daily. 7 tablet 0  . loperamide (IMODIUM) 2 MG capsule Take 2 mg by mouth 4 (four) times daily as needed for diarrhea or loose stools.    . metroNIDAZOLE (METROGEL) 0.75 % vaginal gel Place 1 Applicatorful vaginally 2 (two) times daily. 70 g 0  . mometasone (NASONEX) 50 MCG/ACT nasal spray Place 2 sprays into the nose daily. 17 g 12  . Multiple Vitamins-Minerals (MULTIVITAMIN WITH MINERALS) tablet Take 1 tablet by mouth daily.    . mupirocin ointment (BACTROBAN) 2 % APPLY INTO EACH NOSTRIL TWICE DAILY 22 g 0  . Oxycodone HCl 10 MG TABS Take 1 tablet (10 mg total) by mouth every 6 (six) hours as needed. 90 tablet 0  . predniSONE (DELTASONE) 10 MG tablet Taper as follows: 4-4-3-3-2-2 18 tablet 0  . prochlorperazine (COMPAZINE) 10 MG tablet Take 1 tablet (10 mg total) by mouth every 6 (six) hours as needed. 30 tablet 1  . temazepam (RESTORIL) 15 MG capsule Take 1 capsule (15 mg total) by mouth at bedtime as needed for sleep. 30 capsule 0  . Venlafaxine HCl 75 MG TB24 Take 1 tablet (75 mg total) by mouth once. 30 each 5   No current facility-administered medications for this visit.    SURGICAL HISTORY:  Past Surgical History  Procedure Laterality Date  . Tubal ligation  2002  . Thoracentesis  09/02/11, 09/09/11    right-side pleural effusion    REVIEW OF SYSTEMS:  Constitutional: positive for fatigue Eyes: negative Ears, nose, mouth, throat, and face: negative Respiratory: positive for dyspnea on exertion Cardiovascular: negative Gastrointestinal: negative Genitourinary:negative Integument/breast: negative Hematologic/lymphatic: negative Musculoskeletal:negative Neurological: negative Behavioral/Psych: negative Endocrine: negative Allergic/Immunologic: negative    PHYSICAL EXAMINATION: General appearance: alert, cooperative and no distress Head: Normocephalic, without obvious abnormality, atraumatic Neck: no adenopathy, no JVD, supple, symmetrical, trachea midline and thyroid not enlarged, symmetric, no tenderness/mass/nodules Lymph nodes: Cervical, supraclavicular, and axillary nodes normal. Resp: diminished breath sounds RLL and dullness to percussion RLL Back: symmetric, no curvature. ROM normal. No CVA tenderness. Cardio: regular rate and rhythm, S1, S2 normal, no murmur, click, rub or gallop GI: soft, non-tender; bowel sounds normal; no masses,  no organomegaly Extremities: extremities normal, atraumatic, no cyanosis or edema. There is darkening of the skin and swelling of the right fourth toe. Neurologic: Alert and oriented X 3, normal strength and tone. Normal symmetric reflexes. Normal coordination and gait  ECOG PERFORMANCE STATUS: 1 - Symptomatic but completely ambulatory  Last menstrual period 09/28/2011.  LABORATORY DATA: Lab Results  Component Value Date   WBC 9.9 08/09/2015   HGB 11.2* 08/09/2015   HCT 35.0* 08/09/2015   MCV 92.6 08/09/2015   PLT 300 08/09/2015      Chemistry  Component Value Date/Time   NA 142 08/09/2015 1242   NA 141 07/16/2015 1506   K 3.6 08/09/2015 1242   K 3.6 07/16/2015 1506   CL 103 08/09/2015 1242   CL 104 08/18/2012 1154   CO2 29 08/09/2015 1242   CO2 27 07/16/2015 1506   BUN 15 08/09/2015 1242   BUN 14.8 07/16/2015 1506   CREATININE 0.87 08/09/2015 1242   CREATININE 0.8 07/16/2015 1506      Component Value Date/Time   CALCIUM 8.9 08/09/2015 1242   CALCIUM 9.2 07/16/2015 1506   ALKPHOS 68 07/16/2015 1506   ALKPHOS 113 05/23/2012 1305   AST 19 07/16/2015 1506   AST 15 05/23/2012 1305   ALT 14 07/16/2015 1506   ALT 7 05/23/2012 1305   BILITOT 0.66 07/16/2015 1506   BILITOT 0.6 05/23/2012 1305       RADIOGRAPHIC STUDIES: Ct Chest W Contrast  08/07/2015  CLINICAL DATA:   Patient with history of stage IV lung cancer. Central chest pain. Shortness of breath. EXAM: CT CHEST, ABDOMEN AND PELVIS WITHOUT CONTRAST TECHNIQUE: Multidetector CT imaging of the chest, abdomen and pelvis was performed following the standard protocol without IV contrast. COMPARISON:  CT CAP 01/06/2015 FINDINGS: CT CHEST FINDINGS Mediastinum/Lymph Nodes: No enlarged axillary, mediastinal or hilar lymphadenopathy. Normal heart size. No pericardial effusion. Right-sided Morgagni hernia containing colon, stable. Suggestion of possible filling defects within the left lower lobe pulmonary artery (image 34; series 2) and (image 39; series 2). Lungs/Pleura: Grossly unchanged chronic right pleural thickening with associated small rounded soft tissue lesion within the right lower lobe measuring 2.6 cm (image 83; series 4). There is associated volume loss and surrounding bronchiectasis. Left lung demonstrates multiple new patchy areas of ground-glass attenuation within the left upper and left lower lobe. No left-sided pleural effusion or pneumothorax. Musculoskeletal: Re- demonstrated extensive sclerotic metastasis throughout the visualized osseous structures. CT ABDOMEN PELVIS FINDINGS Hepatobiliary: Grossly unchanged 6 mm low-attenuation lesion within the central liver. No additional hepatic lesions are identified. Gallbladder is unremarkable. Pancreas: Unremarkable Spleen: Unremarkable Adrenals/Urinary Tract: Abnormally rotated right kidney. Normal adrenal glands. Kidneys enhance symmetrically with contrast. Urinary bladder is unremarkable. Stomach/Bowel: No abnormal bowel wall thickening or evidence for bowel obstruction. No free fluid or free intraperitoneal air. Vascular/Lymphatic: Tortuous yet normal caliber abdominal aorta. No retroperitoneal lymphadenopathy. Reproductive: Uterus is unremarkable. Other:  None. Musculoskeletal: Re- demonstrated innumerable sclerotic metastasis throughout the visualized skeleton.  IMPRESSION: Suggestion of possible filling defects within the left lower lobe pulmonary artery which may be artifactual in etiology on non dedicated exam or potentially represent pulmonary emboli. Consider dedicated evaluation with PE protocol chest CT. Interval development of multiple areas of ground-glass attenuation throughout the left lung which are nonspecific and may be secondary to an infectious or inflammatory process or possibly drug reaction. Recommend attention on followup as malignant process is not entirely excluded. Unchanged chronic right fibrothorax. Re- demonstrated extensive sclerotic osseous metastasis. Critical Value/emergent results were called by telephone at the time of interpretation on 08/07/2015 at 3:55 pm to Dr. Curt Bears , who verbally acknowledged these results. Electronically Signed   By: Lovey Newcomer M.D.   On: 08/07/2015 16:07   Ct Angio Chest Pe W/cm &/or Wo Cm  08/08/2015  CLINICAL DATA:  Shortness of breath over 1 month, worsening. Stage IV lung cancer. Questionable pulmonary embolus in the left lower lobe on a none angiogram CT yesterday. Today' s exam is for confirmation and characterization of any pulmonary embolus. EXAM: CT ANGIOGRAPHY CHEST WITH  CONTRAST TECHNIQUE: Multidetector CT imaging of the chest was performed using the standard protocol during bolus administration of intravenous contrast. Multiplanar CT image reconstructions and MIPs were obtained to evaluate the vascular anatomy. CONTRAST:  67 cc Isovue 370 COMPARISON:  08/07/2015 FINDINGS: Despite efforts by the technologist and patient, motion artifact is present on today's exam and could not be eliminated. This reduces exam sensitivity and specificity. Mediastinum/Nodes: Right hemithoracic volume loss with shift of cardiac and mediastinal structures to the right. Calcification along the posterior margin of the aortic valve. Cardiomegaly. Vague hypodensity in right upper lobe pulmonary artery, image 39/4, not  appreciable yesterday, probably related to slow flow rather than embolus. In the area of concern from yesterday's exam, specifically in the vicinity of image 34 series 2 from that exam which corresponds approximately with image 135 of series 6 of the current exam, there is no filling defect in the left lower lobe pulmonary artery. 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 Lungs/Pleura: Stable extensive volume loss in the right hemithorax with nodular scarring inferiorly in the right lung which is unchanged. Emphysema. Stable ground-glass opacities scattered in the left lung. Upper abdomen: Small central hypodense lesion in the liver measuring 6 mm on image 86 series 4, no change, technically nonspecific. Musculoskeletal: Diffuse sclerotic osseous metastatic disease, not appreciably changed. Review of the MIP images confirms the above findings. IMPRESSION: 1. No embolus is identified. Specifically in the area of concern in the left lower lobe, there is no filling defect today. The appearance of possible filling defect on the prior exam may have been from slow flow or other vascular phenomenon. 2. In the right pulmonary artery, particularly in the upper and middle lobe, there is very vague hypodensity in the pulmonary arterial tree which was not readily apparent yesterday. I favor this is being from slow flow/vascular phenomenon probably related to hypoventilation of the right lung. I am very doubtful that this represents embolus. It does not have the sharply defined appearance than I would expect for acute embolus. 3. Exam is otherwise stable from yesterday. Electronically Signed   By: Van Clines M.D.   On: 08/08/2015 11:48   Ct Abdomen Pelvis W Contrast  08/07/2015  CLINICAL DATA:  Patient with history of stage IV lung cancer. Central chest pain. Shortness of breath. EXAM: CT CHEST, ABDOMEN AND PELVIS WITHOUT CONTRAST TECHNIQUE: Multidetector CT  imaging of the chest, abdomen and pelvis was performed following the standard protocol without IV contrast. COMPARISON:  CT CAP 01/06/2015 FINDINGS: CT CHEST FINDINGS Mediastinum/Lymph Nodes: No enlarged axillary, mediastinal or hilar lymphadenopathy. Normal heart size. No pericardial effusion. Right-sided Morgagni hernia containing colon, stable. Suggestion of possible filling defects within the left lower lobe pulmonary artery (image 34; series 2) and (image 39; series 2). Lungs/Pleura: Grossly unchanged chronic right pleural thickening with associated small rounded soft tissue lesion within the right lower lobe measuring 2.6 cm (image 83; series 4). There is associated volume loss and surrounding bronchiectasis. Left lung demonstrates multiple new patchy areas of ground-glass attenuation within the left upper and left lower lobe. No left-sided pleural effusion or pneumothorax. Musculoskeletal: Re- demonstrated extensive sclerotic metastasis throughout the visualized osseous structures. CT ABDOMEN PELVIS FINDINGS Hepatobiliary: Grossly unchanged 6 mm low-attenuation lesion within the central liver. No additional hepatic lesions are identified. Gallbladder is unremarkable. Pancreas: Unremarkable Spleen: Unremarkable Adrenals/Urinary Tract: Abnormally rotated right kidney. Normal adrenal glands. Kidneys enhance symmetrically with contrast. Urinary bladder is unremarkable. Stomach/Bowel: No abnormal bowel wall thickening  or evidence for bowel obstruction. No free fluid or free intraperitoneal air. Vascular/Lymphatic: Tortuous yet normal caliber abdominal aorta. No retroperitoneal lymphadenopathy. Reproductive: Uterus is unremarkable. Other:  None. Musculoskeletal: Re- demonstrated innumerable sclerotic metastasis throughout the visualized skeleton. IMPRESSION: Suggestion of possible filling defects within the left lower lobe pulmonary artery which may be artifactual in etiology on non dedicated exam or potentially  represent pulmonary emboli. Consider dedicated evaluation with PE protocol chest CT. Interval development of multiple areas of ground-glass attenuation throughout the left lung which are nonspecific and may be secondary to an infectious or inflammatory process or possibly drug reaction. Recommend attention on followup as malignant process is not entirely excluded. Unchanged chronic right fibrothorax. Re- demonstrated extensive sclerotic osseous metastasis. Critical Value/emergent results were called by telephone at the time of interpretation on 08/07/2015 at 3:55 pm to Dr. Curt Bears , who verbally acknowledged these results. Electronically Signed   By: Lovey Newcomer M.D.   On: 08/07/2015 16:07   ASSESSMENT AND PLAN: This is a very pleasant 55 years old [place] American female with:  1)  Metastatic non-small cell lung cancer, adenocarcinoma with positive EGFR mutation in exon 19 currently undergoing systemic treatment with oral [name] status post 43 months of treatment. The patient is tolerating her treatment fairly well with no significant adverse effects. The recent CT scan of the chest showed no clear evidence for disease progression but it showed multiple areas of groundglass attenuation suspicious for inflammatory process and the patient was treated with a course of antibiotics and feeling much better. Repeat CT angiogram of the chest showed no evidence for pulmonary embolism. I discussed the imaging results with the patient today. I recommended for her to continue on [name] 150 mg by mouth daily. I will see her back for follow-up visit in 6 weeks for reevaluation and repeat blood work.  2) Metastatic bone disease:  the patient will continue on Xgeva 120 mg subcutaneously every 8 weeks.   3) For pain management, she will continue on the current pain medication with oxycodone.  4) anemia: She will continue on treatment with Integra plus.  5) insomnia: She is currently on Restoril 15 mg by  mouth daily at bedtime when necessary.  The patient voices understanding of current disease status and treatment options and is in agreement with the current care plan.  All questions were answered. The patient knows to call the clinic with any problems, questions or concerns. We can certainly see the patient much sooner if necessary.  Disclaimer: This note was dictated with voice recognition software. Similar sounding words can inadvertently be transcribed and may be missed upon review.

## 2015-08-13 NOTE — Telephone Encounter (Signed)
per pof to sch pt appt-gave pt copy of avs °

## 2015-08-13 NOTE — Telephone Encounter (Signed)
Pt would like for to speak with Dr. Elease Hashimoto only concerning her weight.

## 2015-08-16 ENCOUNTER — Other Ambulatory Visit: Payer: Self-pay | Admitting: Family Medicine

## 2015-08-21 ENCOUNTER — Other Ambulatory Visit: Payer: Self-pay | Admitting: Medical Oncology

## 2015-08-21 ENCOUNTER — Telehealth: Payer: Self-pay | Admitting: Medical Oncology

## 2015-08-21 DIAGNOSIS — C3490 Malignant neoplasm of unspecified part of unspecified bronchus or lung: Secondary | ICD-10-CM

## 2015-08-21 MED ORDER — OXYCODONE HCL 10 MG PO TABS: 10.0000 mg | ORAL_TABLET | Freq: Four times a day (QID) | ORAL | Status: DC | PRN

## 2015-08-21 NOTE — Telephone Encounter (Signed)
Left message

## 2015-08-22 ENCOUNTER — Other Ambulatory Visit: Payer: Self-pay | Admitting: Medical Oncology

## 2015-08-22 ENCOUNTER — Telehealth: Payer: Self-pay | Admitting: Family Medicine

## 2015-08-22 DIAGNOSIS — C3490 Malignant neoplasm of unspecified part of unspecified bronchus or lung: Secondary | ICD-10-CM

## 2015-08-22 MED ORDER — ERLOTINIB HCL 150 MG PO TABS: 150.0000 mg | ORAL_TABLET | Freq: Every day | ORAL | Status: DC

## 2015-08-22 MED FILL — oxyCODONE HCL 10 MG TABS: 10 | 22 days supply | Qty: 90 | Fill #0

## 2015-08-22 NOTE — Telephone Encounter (Signed)
Last filled on 08/12/15 Please advise.

## 2015-08-22 NOTE — Telephone Encounter (Signed)
Pt would like to know if dr Elease Hashimoto will send in another refill of predniSONE (DELTASONE) 10 MG tablet  Pt states it is helping [facility] [address] [facility] [address]

## 2015-08-24 NOTE — Telephone Encounter (Signed)
Refill once but she should not be on this chronically.

## 2015-08-25 MED ORDER — PREDNISONE 10 MG PO TABS: ORAL_TABLET | ORAL | Status: DC

## 2015-08-25 NOTE — Telephone Encounter (Signed)
Refill sent in to pharmacy 

## 2015-08-26 ENCOUNTER — Other Ambulatory Visit: Payer: Self-pay | Admitting: Family Medicine

## 2015-08-26 ENCOUNTER — Telehealth: Payer: Self-pay | Admitting: Family Medicine

## 2015-08-26 NOTE — Telephone Encounter (Deleted)
Pt need new Rx for                   Demographics  Beth [name] 55 year old female September 21, 1960  [place] [address] [phone] (M)  Comm Pref:      Advanced Directives  08/09/15 07/16/15 05/01/15  Does patient have an advance directive? No No No  Would patient like information on creating an advanced directive? No - patient declined information No - patient declined information No - patient declined information  Chief Complaint    Medication Refill   Problem List Overweight  Essential hypertension, benign  GERD  MURMUR  History of depression  Asthma, mild intermittent  Vitamin D deficiency  Normocytic anemia  Anxiety  Lung mass  Pleural effusion  Stage IV adenocarcinoma of lung with right lung collapse  Elevated brain natriuretic peptide (BNP) level  Sinusitis  History of MRSA infection  Encounter for antineoplastic chemotherapy  Health MaintenancePostponedSoonDueLate        Topic Due Last Communication    Hepatitis C Screening 1960-05-11     HIV Screening 05/19/1976     MAMMOGRAM 05/20/2011     COLONOSCOPY 05/20/2011     TETANUS/TDAP 03/23/2015     INFLUENZA VACCINE 10/21/2015     PAP SMEAR 12/05/2015    Reminders and Results None     Care Team and Communications Referring Provider   No referring provider set   PCPs Type  Beth Post, MD General  Other Patient Care Team Members Relationship  None   Recipients of Past Communications   Office Visit - 08/13/2015    Beth Post, MD 08/13/2015 In [facility] Encounter - 08/09/2015    Beth Post, MD 08/09/2015 In Basket  Beth Post, MD 08/09/2015 In Basket  Office Visit - 07/16/2015    Beth Post, MD 07/17/2015 In Basket  Office Visit - 05/01/2015    Beth Post, MD 05/01/2015 In [facility] Encounter - 07/20/2014    Beth Bears, MD 07/20/2014 In Basket  Beth Post, MD 07/20/2014 In  [facility] Encounter - 10/08/2011    Beth Bears, MD 10/14/2011 In Basket  Beth Post, MD 10/14/2011 In Basket  Beth Post, MD 10/08/2011 In [facility] Encounter - 10/04/2011    Beth Bears, MD 10/04/2011 In Basket  Beth Post, MD 10/04/2011 In [facility] Encounter - 03/23/2011    Beth Post, MD 03/23/2011 In [place], MD 03/23/2011 In Basket   Allergies   [phone] RVAMP LAUNCH_OPTIONS;1 RVAMP " [id number]-rvlinknum="[email or web address]">Meloxicam  Medications Outpatient Medications (21) Hospital Medications (0) Clinic-Administered Medications (0)   amLODipine (NORVASC) 5 MG tablet   ergocalciferol (VITAMIN D2) 50000 UNITS capsule   erlotinib ([name]) 150 MG tablet   FeFum-FePoly-FA-B Cmp-C-Biot (INTEGRA PLUS) CAPS   fish oil-omega-3 fatty acids 1000 MG capsule   fluconazole (DIFLUCAN) 150 MG tablet   furosemide (LASIX) 20 MG tablet   guaiFENesin-codeine (ROBITUSSIN AC) 100-10 MG/5ML syrup   hydrochlorothiazide (HYDRODIURIL) 25 MG tablet   Ipratropium-Albuterol (COMBIVENT) 20-100 MCG/ACT AERS respimat   levofloxacin (LEVAQUIN) 500 MG tablet   loperamide (IMODIUM) 2 MG capsule   metroNIDAZOLE (METROGEL) 0.75 % vaginal gel   mometasone (NASONEX) 50 MCG/ACT nasal spray   Multiple Vitamins-Minerals (MULTIVITAMIN WITH MINERALS) tablet   mupirocin ointment (BACTROBAN) 2 %   Oxycodone HCl 10 MG TABS   predniSONE (DELTASONE) 10 MG  tablet   prochlorperazine (COMPAZINE) 10 MG tablet   temazepam (RESTORIL) 15 MG capsule   Venlafaxine HCl 75 MG TB24      Mark as Reviewed Reviewed by MD on 08/13/2015.  Preferred Pharmacies    [facility], [place] [phone] (Phone) [phone] (Fax)  [id number] - [place], [address] [phone] (Phone) [phone] (Fax)  Immunizations/Injections    Influenza Split 12/10/2011,  01/21/2011  Influenza,inj,Quad PF,36+ Mos 11/13/2014, 01/10/2014, 12/04/2012  Pneumococcal Conjugate-13 12/10/2011  Td 03/22/2005  Significant History/Details   Smoking: Former Smoker (Quit Date:01/01/2011), 0.5 ppd, 7.5 pack-years  Smokeless Tobacco: Unknown  Alcohol: No  Comments: dpr on file for Beth [name] (mother), Beth [name] (sister) and Beth [name] (son) 09/01/11  20 open orders  Preferred Language: English  Specialty CommentsShow AllReport No comments regarding your specialty  Family Comments None                             Oxygen Therapy No data recorded                                                                                                                                                                                                                                                                                                                                                      My Last Outpatient Progress Note There are no Outpatient notes of this type for this patient

## 2015-08-26 NOTE — Telephone Encounter (Signed)
Pt need new Rx for hydrochlorothiazide  Pharm: [facility]

## 2015-08-26 NOTE — Telephone Encounter (Signed)
Medication sent in for patient. 

## 2015-08-27 ENCOUNTER — Other Ambulatory Visit: Payer: Self-pay | Admitting: Family Medicine

## 2015-08-27 MED ORDER — PREDNISONE 10 MG PO TABS: ORAL_TABLET | ORAL | Status: DC

## 2015-08-28 ENCOUNTER — Telehealth: Payer: Self-pay | Admitting: Family Medicine

## 2015-08-28 NOTE — Telephone Encounter (Signed)
Pt would like dr burchette to return her call concerning her 08-04-15 visit. If you can not reach pt on home phone. Please call cell

## 2015-09-01 ENCOUNTER — Telehealth: Payer: Self-pay | Admitting: Medical Oncology

## 2015-09-01 ENCOUNTER — Ambulatory Visit: Payer: Medicare Other | Admitting: Internal Medicine

## 2015-09-01 NOTE — Telephone Encounter (Signed)
Patient call back with some persistent dyspnea. Recent CT angiogram no PE. Denies any fever. No cough. She has seen pulmonary previously and we recommended follow-up with them. She had recent lab work which was unremarkable

## 2015-09-01 NOTE — Telephone Encounter (Signed)
Called and left message for pt to call back.

## 2015-09-01 NOTE — Telephone Encounter (Signed)
[name] - Val at [facility] called asking for refill for tarceva, refill faxed  last week and receipt obtained. I refaxed the order

## 2015-09-02 ENCOUNTER — Other Ambulatory Visit: Payer: Self-pay | Admitting: Medical Oncology

## 2015-09-02 NOTE — Telephone Encounter (Signed)
I called [facility] and they have the rx but according to them she has "maxed out on her insurance plan payment". I asked the rep to fax me documentation of the issue so this can be resolved for pt to get her tarceva.

## 2015-09-02 NOTE — Telephone Encounter (Signed)
[facility] called and stated there are no rx refills written on rx and that is why rx not filled. I returned call and told staff member that there is no misunderstanding on rx - there are no refills ordered.

## 2015-09-03 ENCOUNTER — Other Ambulatory Visit: Payer: Self-pay | Admitting: Family Medicine

## 2015-09-04 ENCOUNTER — Other Ambulatory Visit: Payer: Self-pay | Admitting: Medical Oncology

## 2015-09-04 ENCOUNTER — Telehealth: Payer: Self-pay | Admitting: Medical Oncology

## 2015-09-04 ENCOUNTER — Encounter: Payer: Self-pay | Admitting: Internal Medicine

## 2015-09-04 ENCOUNTER — Telehealth: Payer: Self-pay | Admitting: Family Medicine

## 2015-09-04 NOTE — Telephone Encounter (Signed)
PT needs a refill on prednisone call into [facility] [place]. Pt is aware md out of office today

## 2015-09-04 NOTE — Telephone Encounter (Signed)
Pt stated [name] shipping tomorrow.

## 2015-09-04 NOTE — Progress Notes (Signed)
Rcvd staff msg from Beth [name] regarding assistance for Beth [name] because she was told pt has maxed out on her plan.  I called the pharmacy to get clarification and was told the pt's meds is scheduled to go out 09/05/15 and that her ins pd at 100% but there was an issue w/ the number of refills on that Rx.  I called the pt & relayed that message to her as well as to Beth [name].  Beth [name] will call the pt to explain the refill situation.

## 2015-09-05 MED ORDER — PREDNISONE 10 MG PO TABS: ORAL_TABLET | ORAL | Status: DC

## 2015-09-05 NOTE — Telephone Encounter (Signed)
Refill once.  If she continues to wheeze, we need to get her in for follow up to look at possible change in inhalers.

## 2015-09-05 NOTE — Telephone Encounter (Signed)
Notified pt, sent rx to pharmacy

## 2015-09-05 NOTE — Telephone Encounter (Signed)
Pt call today to ask about the refill on PREDNISONE.

## 2015-09-08 ENCOUNTER — Telehealth: Payer: Self-pay | Admitting: Internal Medicine

## 2015-09-08 ENCOUNTER — Other Ambulatory Visit: Payer: Self-pay | Admitting: Medical Oncology

## 2015-09-08 NOTE — Telephone Encounter (Signed)
left msg informing pt 6/21 apt was cancelled per pof.. confirmed 6/22 apt times

## 2015-09-10 ENCOUNTER — Ambulatory Visit: Payer: Medicare Other

## 2015-09-11 ENCOUNTER — Telehealth: Payer: Self-pay | Admitting: Internal Medicine

## 2015-09-11 ENCOUNTER — Other Ambulatory Visit (HOSPITAL_BASED_OUTPATIENT_CLINIC_OR_DEPARTMENT_OTHER): Payer: Medicare Other

## 2015-09-11 ENCOUNTER — Ambulatory Visit (HOSPITAL_BASED_OUTPATIENT_CLINIC_OR_DEPARTMENT_OTHER): Payer: Medicare Other | Admitting: Internal Medicine

## 2015-09-11 ENCOUNTER — Ambulatory Visit (HOSPITAL_BASED_OUTPATIENT_CLINIC_OR_DEPARTMENT_OTHER): Payer: Medicare Other

## 2015-09-11 ENCOUNTER — Other Ambulatory Visit: Payer: Self-pay | Admitting: Internal Medicine

## 2015-09-11 ENCOUNTER — Encounter: Payer: Self-pay | Admitting: Internal Medicine

## 2015-09-11 ENCOUNTER — Other Ambulatory Visit: Payer: Self-pay | Admitting: Medical Oncology

## 2015-09-11 VITALS — BP 142/97 | HR 64 | Temp 98.2°F | Resp 19 | Ht 65.0 in | Wt 272.1 lb

## 2015-09-11 DIAGNOSIS — C3490 Malignant neoplasm of unspecified part of unspecified bronchus or lung: Secondary | ICD-10-CM

## 2015-09-11 DIAGNOSIS — C3491 Malignant neoplasm of unspecified part of right bronchus or lung: Secondary | ICD-10-CM | POA: Diagnosis not present

## 2015-09-11 DIAGNOSIS — G893 Neoplasm related pain (acute) (chronic): Secondary | ICD-10-CM

## 2015-09-11 DIAGNOSIS — G4700 Insomnia, unspecified: Secondary | ICD-10-CM

## 2015-09-11 DIAGNOSIS — R530 Neoplastic (malignant) related fatigue: Secondary | ICD-10-CM | POA: Diagnosis not present

## 2015-09-11 DIAGNOSIS — C7951 Secondary malignant neoplasm of bone: Secondary | ICD-10-CM

## 2015-09-11 DIAGNOSIS — Z5111 Encounter for antineoplastic chemotherapy: Secondary | ICD-10-CM

## 2015-09-11 DIAGNOSIS — D649 Anemia, unspecified: Secondary | ICD-10-CM

## 2015-09-11 LAB — CBC WITH DIFFERENTIAL/PLATELET
BASO%: 0.2 % (ref 0.0–2.0)
BASOS ABS: 0 10*3/uL (ref 0.0–0.1)
EOS ABS: 0.2 10*3/uL (ref 0.0–0.5)
EOS%: 1.8 % (ref 0.0–7.0)
HEMATOCRIT: 34.6 % — AB (ref 34.8–46.6)
HGB: 11.3 g/dL — ABNORMAL LOW (ref 11.6–15.9)
LYMPH#: 2.8 10*3/uL (ref 0.9–3.3)
LYMPH%: 34.7 % (ref 14.0–49.7)
MCH: 30.6 pg (ref 25.1–34.0)
MCHC: 32.7 g/dL (ref 31.5–36.0)
MCV: 93.8 fL (ref 79.5–101.0)
MONO#: 0.6 10*3/uL (ref 0.1–0.9)
MONO%: 7.1 % (ref 0.0–14.0)
NEUT#: 4.6 10*3/uL (ref 1.5–6.5)
NEUT%: 56.2 % (ref 38.4–76.8)
PLATELETS: 306 10*3/uL (ref 145–400)
RBC: 3.69 10*6/uL — ABNORMAL LOW (ref 3.70–5.45)
RDW: 15.6 % — ABNORMAL HIGH (ref 11.2–14.5)
WBC: 8.2 10*3/uL (ref 3.9–10.3)

## 2015-09-11 LAB — COMPREHENSIVE METABOLIC PANEL
ALT: 18 U/L (ref 0–55)
AST: 18 U/L (ref 5–34)
Albumin: 3.5 g/dL (ref 3.5–5.0)
Alkaline Phosphatase: 67 U/L (ref 40–150)
Anion Gap: 9 mEq/L (ref 3–11)
BUN: 16.9 mg/dL (ref 7.0–26.0)
CHLORIDE: 106 meq/L (ref 98–109)
CO2: 30 meq/L — AB (ref 22–29)
Calcium: 8.9 mg/dL (ref 8.4–10.4)
Creatinine: 0.9 mg/dL (ref 0.6–1.1)
EGFR: 85 mL/min/{1.73_m2} — AB (ref >90)
GLUCOSE: 78 mg/dL (ref 70–140)
POTASSIUM: 3.7 meq/L (ref 3.5–5.1)
Sodium: 145 mEq/L (ref 136–145)
Total Bilirubin: 0.57 mg/dL (ref 0.20–1.20)
Total Protein: 7.3 g/dL (ref 6.4–8.3)

## 2015-09-11 MED ORDER — DENOSUMAB 120 MG/1.7ML ~~LOC~~ SOLN
120.0000 mg | Freq: Once | SUBCUTANEOUS | Status: AC
  Administered 2015-09-11: 120 mg via SUBCUTANEOUS
  Filled 2015-09-11: qty 1.7

## 2015-09-11 MED ORDER — OXYCODONE HCL 10 MG PO TABS: 10.0000 mg | ORAL_TABLET | Freq: Four times a day (QID) | ORAL | Status: DC | PRN

## 2015-09-11 NOTE — Patient Instructions (Signed)
Denosumab injection  What is this medicine?  DENOSUMAB (den oh sue mab) slows bone breakdown. Prolia is used to treat osteoporosis in women after menopause and in men. Xgeva is used to prevent bone fractures and other bone problems caused by cancer bone metastases. Xgeva is also used to treat giant cell tumor of the bone.  This medicine may be used for other purposes; ask your health care provider or pharmacist if you have questions.  What should I tell my health care provider before I take this medicine?  They need to know if you have any of these conditions:  -dental disease  -eczema  -infection or history of infections  -kidney disease or on dialysis  -low blood calcium or vitamin D  -malabsorption syndrome  -scheduled to have surgery or tooth extraction  -taking medicine that contains denosumab  -thyroid or parathyroid disease  -an unusual reaction to denosumab, other medicines, foods, dyes, or preservatives  -pregnant or trying to get pregnant  -breast-feeding  How should I use this medicine?  This medicine is for injection under the skin. It is given by a health care professional in a hospital or clinic setting.  If you are getting Prolia, a special MedGuide will be given to you by the pharmacist with each prescription and refill. Be sure to read this information carefully each time.  For Prolia, talk to your pediatrician regarding the use of this medicine in children. Special care may be needed. For Xgeva, talk to your pediatrician regarding the use of this medicine in children. While this drug may be prescribed for children as young as 13 years for selected conditions, precautions do apply.  Overdosage: If you think you have taken too much of this medicine contact a poison control center or emergency room at once.  NOTE: This medicine is only for you. Do not share this medicine with others.  What if I miss a dose?  It is important not to miss your dose. Call your doctor or health care professional if you are  unable to keep an appointment.  What may interact with this medicine?  Do not take this medicine with any of the following medications:  -other medicines containing denosumab  This medicine may also interact with the following medications:  -medicines that suppress the immune system  -medicines that treat cancer  -steroid medicines like prednisone or cortisone  This list may not describe all possible interactions. Give your health care provider a list of all the medicines, herbs, non-prescription drugs, or dietary supplements you use. Also tell them if you smoke, drink alcohol, or use illegal drugs. Some items may interact with your medicine.  What should I watch for while using this medicine?  Visit your doctor or health care professional for regular checks on your progress. Your doctor or health care professional may order blood tests and other tests to see how you are doing.  Call your doctor or health care professional if you get a cold or other infection while receiving this medicine. Do not treat yourself. This medicine may decrease your body's ability to fight infection.  You should make sure you get enough calcium and vitamin D while you are taking this medicine, unless your doctor tells you not to. Discuss the foods you eat and the vitamins you take with your health care professional.  See your dentist regularly. Brush and floss your teeth as directed. Before you have any dental work done, tell your dentist you are receiving this medicine.  Do   not become pregnant while taking this medicine or for 5 months after stopping it. Women should inform their doctor if they wish to become pregnant or think they might be pregnant. There is a potential for serious side effects to an unborn child. Talk to your health care professional or pharmacist for more information.  What side effects may I notice from receiving this medicine?  Side effects that you should report to your doctor or health care professional as soon as  possible:  -allergic reactions like skin rash, itching or hives, swelling of the face, lips, or tongue  -breathing problems  -chest pain  -fast, irregular heartbeat  -feeling faint or lightheaded, falls  -fever, chills, or any other sign of infection  -muscle spasms, tightening, or twitches  -numbness or tingling  -skin blisters or bumps, or is dry, peels, or red  -slow healing or unexplained pain in the mouth or jaw  -unusual bleeding or bruising  Side effects that usually do not require medical attention (Report these to your doctor or health care professional if they continue or are bothersome.):  -muscle pain  -stomach upset, gas  This list may not describe all possible side effects. Call your doctor for medical advice about side effects. You may report side effects to FDA at 1-800-FDA-1088.  Where should I keep my medicine?  This medicine is only given in a clinic, doctor's office, or other health care setting and will not be stored at home.  NOTE: This sheet is a summary. It may not cover all possible information. If you have questions about this medicine, talk to your doctor, pharmacist, or health care provider.      2016, Elsevier/Gold Standard. (2011-09-06 12:37:47)

## 2015-09-11 NOTE — Progress Notes (Signed)
[facility] Telephone:([phone]) [phone]   Fax:(336) [phone]  OFFICE PROGRESS NOTE  Beth Post, MD [place] [address]  DIAGNOSIS AND STAGE: Metastatic non-small cell lung cancer, adenocarcinoma with positive EGFR mutation in exon 19 diagnosed in June of 2013   PRIOR THERAPY: palliative radiotherapy to the right lung under the care of Dr. Valere Dross expected to be completed on 10/29/2011.   CURRENT THERAPY:  1. [name] 150 mg by mouth daily started 10/11/2011. Status [name] 44 months of therapy 2. Xgeva 120 mg subcutaneously every 8 weeks.  CHEMOTHERAPY INTENT: Palliative  CURRENT # OF CHEMOTHERAPY CYCLES: 45 CURRENT ANTIEMETICS: Compazine  CURRENT SMOKING STATUS: Former smoker but quit.  ORAL CHEMOTHERAPY AND CONSENT: Oral [name]  CURRENT BISPHOSPHONATES USE: Xgeva  PAIN MANAGEMENT: well-controlled with oxycodone when necessary.  NARCOTICS INDUCED CONSTIPATION: None  LIVING WILL AND CODE STATUS: Full code.   INTERVAL HISTORY: Beth [name] 55 y.o. female returns to the clinic today for followup visit. The patient is feeling fine today with no specific complaints except for mild fatigue. She denied having any significant chest pain, shortness of breath except with exertion, cough or hemoptysis. She denied having any significant skin rash or diarrhea. The patient denied having any significant weight loss or night sweats. She is tolerating her treatment with [name] fairly well. She had repeat CBC and comprehensive metabolic panel performed recently and she is here for evaluation and discussion of her lab results. She is requesting refill of her pain medication.  MEDICAL HISTORY: Past Medical History  Diagnosis Date  . Asthma   . GERD (gastroesophageal reflux disease)   . Hypertension   . Heart murmur   . Fibromyalgia   . Arthritis of knee, degenerative   . Endocarditis     as teenager  . Complication of anesthesia     difficulty  waking up  . Shortness of breath   . Lung mass     R- ADENOCARCINOMA  . Sleep apnea     no longer using CPAP  . Pleural effusion 08/30/11  . Anxiety   . Depression   . History of radiation therapy 09/29/11-11/04/2011    right lung 2700cGy 15 sessions  . Osseous metastasis ([place]) 09/20/11    per PET scan    ALLERGIES:  is allergic to meloxicam.  MEDICATIONS:  Current Outpatient Prescriptions  Medication Sig Dispense Refill  . amLODipine (NORVASC) 5 MG tablet TK 1 T PO ONCE A DAY  5  . ergocalciferol (VITAMIN D2) 50000 UNITS capsule Take 1 capsule (50,000 Units total) by mouth once a week. 12 capsule 1  . erlotinib ([name]) 150 MG tablet Take 1 tablet (150 mg total) by mouth daily. Take on an empty stomach 1 hour before meals or 2 hours after. 30 tablet 0  . FeFum-FePoly-FA-B Cmp-C-Biot (INTEGRA PLUS) CAPS Take 1 each by mouth daily. 30 capsule 3  . fish oil-omega-3 fatty acids 1000 MG capsule Take 2 g by mouth daily.    . furosemide (LASIX) 20 MG tablet Take one daily as needed for severe edema. 30 tablet 1  . guaiFENesin-codeine (ROBITUSSIN AC) 100-10 MG/5ML syrup Take 5 mLs by mouth 3 (three) times daily as needed for cough. 120 mL 0  . hydrochlorothiazide (HYDRODIURIL) 25 MG tablet TAKE 1 TABLET BY MOUTH EVERY DAY 90 tablet 2  . Ipratropium-Albuterol (COMBIVENT) 20-100 MCG/ACT AERS respimat Inhale 1 puff into the lungs every 6 (six) hours as needed for wheezing. 4 g 2  .  mometasone (NASONEX) 50 MCG/ACT nasal spray Place 2 sprays into the nose daily. 17 g 12  . Multiple Vitamins-Minerals (MULTIVITAMIN WITH MINERALS) tablet Take 1 tablet by mouth daily.    . mupirocin ointment (BACTROBAN) 2 % APPLY INTO EACH NOSTRIL TWICE DAILY 22 g 0  . Oxycodone HCl 10 MG TABS Take 1 tablet (10 mg total) by mouth every 6 (six) hours as needed. 90 tablet 0  . predniSONE (DELTASONE) 10 MG tablet Taper as follows: 4-4-3-3-2-2 18 tablet 0  . PREMARIN vaginal cream Place 1 application vaginally 2 (two)  times daily.  5  . temazepam (RESTORIL) 15 MG capsule Take 1 capsule (15 mg total) by mouth at bedtime as needed for sleep. 30 capsule 0  . Venlafaxine HCl 75 MG TB24 Take 1 tablet (75 mg total) by mouth once. 30 each 5  . fluconazole (DIFLUCAN) 150 MG tablet Reported on 09/11/2015  1  . loperamide (IMODIUM) 2 MG capsule Take 2 mg by mouth 4 (four) times daily as needed for diarrhea or loose stools.    . predniSONE (DELTASONE) 20 MG tablet Take 20 mg by mouth daily. taper    . prochlorperazine (COMPAZINE) 10 MG tablet TAKE ONE TABLET BY MOUTH EVERY 6 HOURS AS NEEDED (Patient not taking: Reported on 09/11/2015) 30 tablet 1   No current facility-administered medications for this visit.    SURGICAL HISTORY:  Past Surgical History  Procedure Laterality Date  . Tubal ligation  2002  . Thoracentesis  09/02/11, 09/09/11    right-side pleural effusion    REVIEW OF SYSTEMS:  A comprehensive review of systems was negative except for: Constitutional: positive for fatigue   PHYSICAL EXAMINATION: General appearance: alert, cooperative and no distress Head: Normocephalic, without obvious abnormality, atraumatic Neck: no adenopathy, no JVD, supple, symmetrical, trachea midline and thyroid not enlarged, symmetric, no tenderness/mass/nodules Lymph nodes: Cervical, supraclavicular, and axillary nodes normal. Resp: diminished breath sounds RLL and dullness to percussion RLL Back: symmetric, no curvature. ROM normal. No CVA tenderness. Cardio: regular rate and rhythm, S1, S2 normal, no murmur, click, rub or gallop GI: soft, non-tender; bowel sounds normal; no masses,  no organomegaly Extremities: extremities normal, atraumatic, no cyanosis or edema. There is darkening of the skin and swelling of the right fourth toe. Neurologic: Alert and oriented X 3, normal strength and tone. Normal symmetric reflexes. Normal coordination and gait  ECOG PERFORMANCE STATUS: 1 - Symptomatic but completely ambulatory  Blood  pressure 142/97, pulse 64, temperature 98.2 F (36.8 C), temperature source Oral, resp. rate 19, height _0  (1.651 m), weight 272 lb 1.6 oz (123.424 kg), last menstrual period 09/28/2011, SpO2 97 %.  LABORATORY DATA: Lab Results  Component Value Date   WBC 8.2 09/11/2015   HGB 11.3* 09/11/2015   HCT 34.6* 09/11/2015   MCV 93.8 09/11/2015   PLT 306 09/11/2015      Chemistry      Component Value Date/Time   NA 142 08/09/2015 1242   NA 141 07/16/2015 1506   K 3.6 08/09/2015 1242   K 3.6 07/16/2015 1506   CL 103 08/09/2015 1242   CL 104 08/18/2012 1154   CO2 29 08/09/2015 1242   CO2 27 07/16/2015 1506   BUN 15 08/09/2015 1242   BUN 14.8 07/16/2015 1506   CREATININE 0.87 08/09/2015 1242   CREATININE 0.8 07/16/2015 1506      Component Value Date/Time   CALCIUM 8.9 08/09/2015 1242   CALCIUM 9.2 07/16/2015 1506   ALKPHOS 68 07/16/2015 1506  ALKPHOS 113 05/23/2012 1305   AST 19 07/16/2015 1506   AST 15 05/23/2012 1305   ALT 14 07/16/2015 1506   ALT 7 05/23/2012 1305   BILITOT 0.66 07/16/2015 1506   BILITOT 0.6 05/23/2012 1305       RADIOGRAPHIC STUDIES: No results found. ASSESSMENT AND PLAN: This is a very pleasant 55 years old [place] female with:  1)  Metastatic non-small cell lung cancer, adenocarcinoma with positive EGFR mutation in exon 19 currently undergoing systemic treatment with oral [name] status [name] 44 months of treatment. The patient is tolerating her treatment fairly well with no significant adverse effects. I recommended for her to continue on [name] 150 mg by mouth daily. I will see her back for follow-up visit in 2 months for reevaluation and repeat blood work.  2) Metastatic bone disease:  the patient will continue on Xgeva 120 mg subcutaneously every 8 weeks.   3) For pain management, she will continue on the current pain medication with oxycodone. I gave her refill of oxycodone today.  4) anemia: She will continue on treatment with  Integra plus.  5) insomnia: She is currently on Restoril 15 mg by mouth daily at bedtime when necessary.  The patient voices understanding of current disease status and treatment options and is in agreement with the current care plan.  All questions were answered. The patient knows to call the clinic with any problems, questions or concerns. We can certainly see the patient much sooner if necessary.  Disclaimer: This note was dictated with voice recognition software. Similar sounding words can inadvertently be transcribed and may be missed upon review.

## 2015-09-11 NOTE — Telephone Encounter (Signed)
per pof to sch pt appt-gave pt copy of avs °

## 2015-09-11 NOTE — Telephone Encounter (Signed)
cld pt and left a message of time & daste of appt on 8/23'@1'$ 

## 2015-09-16 ENCOUNTER — Telehealth: Payer: Self-pay | Admitting: Family Medicine

## 2015-09-16 ENCOUNTER — Other Ambulatory Visit: Payer: Self-pay | Admitting: Family Medicine

## 2015-09-16 MED ORDER — BUDESONIDE-FORMOTEROL FUMARATE 80-4.5 MCG/ACT IN AERO: INHALATION_SPRAY | RESPIRATORY_TRACT | Status: DC

## 2015-09-16 NOTE — Telephone Encounter (Signed)
D/c Combivent and let's try Symbicort 80 2 puffs every 12 hours.  Office follow up 3-4 weeks after start to reassess.

## 2015-09-16 NOTE — Telephone Encounter (Signed)
I have sent in new inhaler and D/C old one off medication list. Pt is aware of change via voicemail.

## 2015-09-16 NOTE — Telephone Encounter (Signed)
Pt would like to try a different inhaler that is a little stronger the one she is currently on really don't work.

## 2015-09-16 NOTE — Telephone Encounter (Signed)
Pt is currently using Combivent Respimat 20-100 mcg/act every 6 hours as needed for wheezing.

## 2015-09-17 ENCOUNTER — Telehealth: Payer: Self-pay | Admitting: Family Medicine

## 2015-09-17 MED ORDER — FLUTICASONE FUROATE-VILANTEROL 100-25 MCG/INH IN AEPB: 1.0000 | INHALATION_SPRAY | Freq: Every day | RESPIRATORY_TRACT | Status: DC

## 2015-09-17 NOTE — Telephone Encounter (Signed)
New medication sent in for patient, Symbicort d/c off list.

## 2015-09-17 NOTE — Telephone Encounter (Signed)
[name] w/[facility] at [employer] said the [employer] does not [employer] but they do cover [employer].  Please advise.

## 2015-09-17 NOTE — Telephone Encounter (Signed)
Start Breo 100 mcg one inhalation ONCE daily.

## 2015-09-17 NOTE — Telephone Encounter (Signed)
Pt needs something today she is out of symbicort

## 2015-09-22 ENCOUNTER — Other Ambulatory Visit: Payer: Self-pay | Admitting: *Deleted

## 2015-09-22 DIAGNOSIS — C3490 Malignant neoplasm of unspecified part of unspecified bronchus or lung: Secondary | ICD-10-CM

## 2015-09-22 MED ORDER — ERLOTINIB HCL 150 MG PO TABS: 150.0000 mg | ORAL_TABLET | Freq: Every day | ORAL | Status: DC

## 2015-09-22 NOTE — Telephone Encounter (Signed)
rx faxed to [facility]

## 2015-10-03 ENCOUNTER — Other Ambulatory Visit: Payer: Self-pay | Admitting: Family Medicine

## 2015-10-08 ENCOUNTER — Ambulatory Visit: Payer: Medicare Other | Admitting: Internal Medicine

## 2015-10-08 ENCOUNTER — Institutional Professional Consult (permissible substitution): Payer: Medicare Other | Admitting: Pulmonary Disease

## 2015-10-08 ENCOUNTER — Other Ambulatory Visit: Payer: Medicare Other

## 2015-10-13 ENCOUNTER — Other Ambulatory Visit: Payer: Self-pay | Admitting: *Deleted

## 2015-10-13 DIAGNOSIS — C3490 Malignant neoplasm of unspecified part of unspecified bronchus or lung: Secondary | ICD-10-CM

## 2015-10-13 DIAGNOSIS — Z5111 Encounter for antineoplastic chemotherapy: Secondary | ICD-10-CM

## 2015-10-13 MED ORDER — OXYCODONE HCL 10 MG PO TABS: 10.0000 mg | ORAL_TABLET | Freq: Four times a day (QID) | ORAL | 0 refills | Status: DC | PRN

## 2015-10-13 MED FILL — oxyCODONE HCL 10 MG TABS: 10 | 23 days supply | Qty: 90 | Fill #0

## 2015-10-13 NOTE — Telephone Encounter (Signed)
Pt called with request for refill on Oxycodone. Notified pt rx ready for pick up

## 2015-10-17 ENCOUNTER — Ambulatory Visit (INDEPENDENT_AMBULATORY_CARE_PROVIDER_SITE_OTHER): Payer: Medicare Other | Admitting: Family Medicine

## 2015-10-17 VITALS — BP 100/80 | HR 113 | Temp 98.6°F | Ht 65.0 in | Wt 268.4 lb

## 2015-10-17 DIAGNOSIS — R202 Paresthesia of skin: Secondary | ICD-10-CM | POA: Diagnosis not present

## 2015-10-17 MED ORDER — ERGOCALCIFEROL 1.25 MG (50000 UT) PO CAPS
50000.0000 [IU] | ORAL_CAPSULE | ORAL | 3 refills | Status: AC
Start: 2015-10-17 — End: ?

## 2015-10-17 NOTE — Progress Notes (Signed)
Pre visit review using our clinic review tool, if applicable. No additional management support is needed unless otherwise documented below in the visit note. 

## 2015-10-17 NOTE — Patient Instructions (Signed)
Follow up for any left arm weakness, persistent numbness, or new symptoms

## 2015-10-17 NOTE — Progress Notes (Signed)
Subjective:     Patient ID: Beth [name], female   DOB: [birth year], 55 y.o.   MRN: [id number]  HPI Patient seen with right upper extremity numbness and tingling sensation. She's had intermittent symptoms for the past week. No neck pain. No weakness. Symptoms are very transient usually only lasting about 2 minutes or less. This may be positional. She has not had any radiculopathy symptoms. No fevers or chills. No appetite or weight changes.  She has stage IV adenocarcinoma of the right lung and is followed closely by oncology.  Past Medical History:  Diagnosis Date  . Anxiety   . Arthritis of knee, degenerative   . Asthma   . Complication of anesthesia    difficulty waking up  . Depression   . Endocarditis    as teenager  . Fibromyalgia   . GERD (gastroesophageal reflux disease)   . Heart murmur   . History of radiation therapy 09/29/11-11/04/2011   right lung 2700cGy 15 sessions  . Hypertension   . Lung mass    R- ADENOCARCINOMA  . Osseous metastasis ([place]) 09/20/11   per PET scan  . Pleural effusion 08/30/11  . Shortness of breath   . Sleep apnea    no longer using CPAP   Past Surgical History:  Procedure Laterality Date  . THORACENTESIS  09/02/11, 09/09/11   right-side pleural effusion  . TUBAL LIGATION  2002    reports that she quit smoking about 4 years ago. Her smoking use included Cigarettes. She has a 7.50 pack-year smoking history. She does not have any smokeless tobacco history on file. She reports that she does not drink alcohol or use drugs. family history includes Alcohol abuse in her father; Hypertension in her maternal aunt, maternal grandfather, maternal grandmother, maternal uncle, and mother; Sarcoidosis in her sister. Allergies  Allergen Reactions  . Meloxicam     Possible GI bleed      Review of Systems  Constitutional: Negative for chills, fever and unexpected weight change.  Neurological: Positive for numbness. Negative for weakness and headaches.        Objective:   Physical Exam  Constitutional: She appears well-developed and well-nourished.  Cardiovascular: Normal rate and regular rhythm.   Pulmonary/Chest: Effort normal and breath sounds normal. No respiratory distress. She has no wheezes. She has no rales.  Neurological: She is alert. No cranial nerve deficit.  Full-strength upper extremity. Symmetric reflexes. Normal sensory function to touch.       Assessment:     Transient/recurrent paresthesias right upper extremity. These are very transient and appeared to be more positional related with nerve compression. She is not describing any cervical radiculitis type symptoms    Plan:     Reassurance. Follow-up for any weakness or persistent symptoms.  [name] MD [facility]

## 2015-11-03 ENCOUNTER — Other Ambulatory Visit: Payer: Self-pay | Admitting: Family Medicine

## 2015-11-12 ENCOUNTER — Ambulatory Visit (HOSPITAL_BASED_OUTPATIENT_CLINIC_OR_DEPARTMENT_OTHER): Payer: Medicare Other

## 2015-11-12 ENCOUNTER — Other Ambulatory Visit (HOSPITAL_BASED_OUTPATIENT_CLINIC_OR_DEPARTMENT_OTHER): Payer: Medicare Other

## 2015-11-12 ENCOUNTER — Ambulatory Visit (HOSPITAL_BASED_OUTPATIENT_CLINIC_OR_DEPARTMENT_OTHER): Payer: Medicare Other | Admitting: Internal Medicine

## 2015-11-12 ENCOUNTER — Encounter: Payer: Self-pay | Admitting: Internal Medicine

## 2015-11-12 VITALS — BP 127/92 | HR 91 | Temp 98.6°F | Resp 18 | Ht 65.0 in | Wt 266.0 lb

## 2015-11-12 DIAGNOSIS — D649 Anemia, unspecified: Secondary | ICD-10-CM

## 2015-11-12 DIAGNOSIS — C7951 Secondary malignant neoplasm of bone: Secondary | ICD-10-CM | POA: Diagnosis not present

## 2015-11-12 DIAGNOSIS — G893 Neoplasm related pain (acute) (chronic): Secondary | ICD-10-CM | POA: Diagnosis not present

## 2015-11-12 DIAGNOSIS — Z5111 Encounter for antineoplastic chemotherapy: Secondary | ICD-10-CM

## 2015-11-12 DIAGNOSIS — C3490 Malignant neoplasm of unspecified part of unspecified bronchus or lung: Secondary | ICD-10-CM

## 2015-11-12 DIAGNOSIS — C3491 Malignant neoplasm of unspecified part of right bronchus or lung: Secondary | ICD-10-CM

## 2015-11-12 DIAGNOSIS — G4700 Insomnia, unspecified: Secondary | ICD-10-CM | POA: Diagnosis not present

## 2015-11-12 LAB — CBC WITH DIFFERENTIAL/PLATELET
BASO%: 0.2 % (ref 0.0–2.0)
Basophils Absolute: 0 10*3/uL (ref 0.0–0.1)
EOS%: 3.3 % (ref 0.0–7.0)
Eosinophils Absolute: 0.2 10*3/uL (ref 0.0–0.5)
HCT: 38.6 % (ref 34.8–46.6)
HEMOGLOBIN: 12.7 g/dL (ref 11.6–15.9)
LYMPH#: 2.2 10*3/uL (ref 0.9–3.3)
LYMPH%: 40.2 % (ref 14.0–49.7)
MCH: 30.6 pg (ref 25.1–34.0)
MCHC: 32.9 g/dL (ref 31.5–36.0)
MCV: 93 fL (ref 79.5–101.0)
MONO#: 0.4 10*3/uL (ref 0.1–0.9)
MONO%: 8.1 % (ref 0.0–14.0)
NEUT%: 48.2 % (ref 38.4–76.8)
NEUTROS ABS: 2.6 10*3/uL (ref 1.5–6.5)
PLATELETS: 257 10*3/uL (ref 145–400)
RBC: 4.15 10*6/uL (ref 3.70–5.45)
RDW: 15.4 % — AB (ref 11.2–14.5)
WBC: 5.4 10*3/uL (ref 3.9–10.3)

## 2015-11-12 LAB — COMPREHENSIVE METABOLIC PANEL
ALBUMIN: 3.7 g/dL (ref 3.5–5.0)
ALK PHOS: 80 U/L (ref 40–150)
ALT: 17 U/L (ref 0–55)
AST: 23 U/L (ref 5–34)
Anion Gap: 9 mEq/L (ref 3–11)
BILIRUBIN TOTAL: 0.59 mg/dL (ref 0.20–1.20)
BUN: 16.2 mg/dL (ref 7.0–26.0)
CO2: 28 meq/L (ref 22–29)
CREATININE: 0.8 mg/dL (ref 0.6–1.1)
Calcium: 9.9 mg/dL (ref 8.4–10.4)
Chloride: 106 mEq/L (ref 98–109)
GLUCOSE: 103 mg/dL (ref 70–140)
Potassium: 3.6 mEq/L (ref 3.5–5.1)
SODIUM: 143 meq/L (ref 136–145)
TOTAL PROTEIN: 8 g/dL (ref 6.4–8.3)

## 2015-11-12 MED ORDER — OXYCODONE HCL 10 MG PO TABS: 10.0000 mg | ORAL_TABLET | Freq: Four times a day (QID) | ORAL | 0 refills | Status: DC | PRN

## 2015-11-12 MED ORDER — DENOSUMAB 120 MG/1.7ML ~~LOC~~ SOLN
120.0000 mg | Freq: Once | SUBCUTANEOUS | Status: AC
  Administered 2015-11-12: 120 mg via SUBCUTANEOUS
  Filled 2015-11-12: qty 1.7

## 2015-11-12 MED FILL — oxyCODONE HCL 10 MG TABS: 10 | 22 days supply | Qty: 90 | Fill #0

## 2015-11-12 NOTE — Progress Notes (Signed)
[facility] Telephone:([phone]) [phone]   [phone]  OFFICE PROGRESS NOTE  [name], MD [place] [address]  DIAGNOSIS AND STAGE: Metastatic non-small cell lung cancer, adenocarcinoma with positive EGFR mutation in exon 19 diagnosed in June of 2013   PRIOR THERAPY: palliative radiotherapy to the right lung under the care of Dr. Valere Dross expected to be completed on 10/29/2011.   CURRENT THERAPY:  1. [name] 150 mg by mouth daily started 10/11/2011. Status post 46 months of therapy 2. Xgeva 120 mg subcutaneously every 8 weeks.  CHEMOTHERAPY INTENT: Palliative  CURRENT # OF CHEMOTHERAPY CYCLES: 47 CURRENT ANTIEMETICS: Compazine  CURRENT SMOKING STATUS: Former smoker but quit.  ORAL CHEMOTHERAPY AND CONSENT: Oral [name]  CURRENT BISPHOSPHONATES USE: Xgeva  PAIN MANAGEMENT: well-controlled with oxycodone when necessary.  NARCOTICS INDUCED CONSTIPATION: None  LIVING WILL AND CODE STATUS: Full code.   INTERVAL HISTORY: Beth [name] 55 y.o. female returns to the clinic today for followup visit. The patient is feeling fine today with no specific complaints except for mild fatigue and occasional upset stomach. She denied having any significant chest pain, shortness of breath except with exertion, cough or hemoptysis. She denied having any significant skin rash or diarrhea but has some soreness in her scalp. The patient denied having any significant weight loss or night sweats. She is tolerating her treatment with [name] fairly well. She had repeat CBC and comprehensive metabolic panel performed recently and she is here for evaluation and discussion of her lab results. She is requesting refill of her pain medication.  MEDICAL HISTORY: Past Medical History:  Diagnosis Date  . Anxiety   . Arthritis of knee, degenerative   . Asthma   . Complication of anesthesia    difficulty waking up  . Depression   . Endocarditis    as  teenager  . Fibromyalgia   . GERD (gastroesophageal reflux disease)   . Heart murmur   . History of radiation therapy 09/29/11-11/04/2011   right lung 2700cGy 15 sessions  . Hypertension   . Lung mass    R- ADENOCARCINOMA  . Osseous metastasis ([facility]) 09/20/11   per PET scan  . Pleural effusion 08/30/11  . Shortness of breath   . Sleep apnea    no longer using CPAP    ALLERGIES:  is allergic to meloxicam.  MEDICATIONS:  Current Outpatient Prescriptions  Medication Sig Dispense Refill  . amLODipine (NORVASC) 5 MG tablet TK 1 T PO ONCE A DAY  5  . COMBIVENT RESPIMAT 20-100 MCG/ACT AERS respimat INHALE 1 PUFF INTO THE LUNGS EVERY 6 HOURS AS NEEDED FOR WHEEZING 4 g 2  . ergocalciferol (VITAMIN D2) 50000 units capsule Take 1 capsule (50,000 Units total) by mouth once a week. 12 capsule 3  . erlotinib ([name]) 150 MG tablet Take 1 tablet (150 mg total) by mouth daily. Take on an empty stomach 1 hour before meals or 2 hours after. 30 tablet 0  . FeFum-FePoly-FA-B Cmp-C-Biot (INTEGRA PLUS) CAPS Take 1 each by mouth daily. 30 capsule 3  . fish oil-omega-3 fatty acids 1000 MG capsule Take 2 g by mouth daily.    . fluticasone furoate-vilanterol (BREO ELLIPTA) 100-25 MCG/INH AEPB Inhale 1 puff into the lungs daily. 60 each 2  . furosemide (LASIX) 20 MG tablet Take one daily as needed for severe edema. 30 tablet 1  . hydrochlorothiazide (HYDRODIURIL) 25 MG tablet TAKE 1 TABLET BY MOUTH EVERY DAY 90 tablet 2  .  loperamide (IMODIUM) 2 MG capsule Take 2 mg by mouth 4 (four) times daily as needed for diarrhea or loose stools.    . mometasone (NASONEX) 50 MCG/ACT nasal spray Place 2 sprays into the nose daily. 17 g 12  . Multiple Vitamins-Minerals (MULTIVITAMIN WITH MINERALS) tablet Take 1 tablet by mouth daily.    . mupirocin ointment (BACTROBAN) 2 % APPLY INTO EACH NOSTRIL TWICE DAILY 22 g 0  . Oxycodone HCl 10 MG TABS Take 1 tablet (10 mg total) by mouth every 6 (six) hours as needed. 90 tablet 0  .  PREMARIN vaginal cream Place 1 application vaginally 2 (two) times daily.  5  . prochlorperazine (COMPAZINE) 10 MG tablet TAKE ONE TABLET BY MOUTH EVERY 6 HOURS AS NEEDED 30 tablet 1  . temazepam (RESTORIL) 15 MG capsule Take 1 capsule (15 mg total) by mouth at bedtime as needed for sleep. 30 capsule 0  . Venlafaxine HCl 75 MG TB24 Take 1 tablet (75 mg total) by mouth once. 30 each 5   No current facility-administered medications for this visit.    Facility-Administered Medications Ordered in Other Visits  Medication Dose Route Frequency Provider Last Rate Last Dose  . denosumab (XGEVA) injection 120 mg  120 mg Subcutaneous Once Curt Bears, MD        SURGICAL HISTORY:  Past Surgical History:  Procedure Laterality Date  . THORACENTESIS  09/02/11, 09/09/11   right-side pleural effusion  . TUBAL LIGATION  2002    REVIEW OF SYSTEMS:  A comprehensive review of systems was negative except for: Constitutional: positive for fatigue   PHYSICAL EXAMINATION: General appearance: alert, cooperative and no distress Head: Normocephalic, without obvious abnormality, atraumatic Neck: no adenopathy, no JVD, supple, symmetrical, trachea midline and thyroid not enlarged, symmetric, no tenderness/mass/nodules Lymph nodes: Cervical, supraclavicular, and axillary nodes normal. Resp: diminished breath sounds RLL and dullness to percussion RLL Back: symmetric, no curvature. ROM normal. No CVA tenderness. Cardio: regular rate and rhythm, S1, S2 normal, no murmur, click, rub or gallop GI: soft, non-tender; bowel sounds normal; no masses,  no organomegaly Extremities: extremities normal, atraumatic, no cyanosis or edema. There is darkening of the skin and swelling of the right fourth toe. Neurologic: Alert and oriented X 3, normal strength and tone. Normal symmetric reflexes. Normal coordination and gait  ECOG PERFORMANCE STATUS: 1 - Symptomatic but completely ambulatory  Blood pressure (!) 127/92, pulse  91, temperature 98.6 F (37 C), temperature source Oral, resp. rate 18, height '5\' 5"'  (1.651 m), weight 266 lb (120.7 kg), last menstrual period 09/28/2011, SpO2 100 %.  LABORATORY DATA: Lab Results  Component Value Date   WBC 5.4 11/12/2015   HGB 12.7 11/12/2015   HCT 38.6 11/12/2015   MCV 93.0 11/12/2015   PLT 257 11/12/2015      Chemistry      Component Value Date/Time   NA 143 11/12/2015 1248   K 3.6 11/12/2015 1248   CL 103 08/09/2015 1242   CL 104 08/18/2012 1154   CO2 28 11/12/2015 1248   BUN 16.2 11/12/2015 1248   CREATININE 0.8 11/12/2015 1248      Component Value Date/Time   CALCIUM 9.9 11/12/2015 1248   ALKPHOS 80 11/12/2015 1248   AST 23 11/12/2015 1248   ALT 17 11/12/2015 1248   BILITOT 0.59 11/12/2015 1248       RADIOGRAPHIC STUDIES: No results found. ASSESSMENT AND PLAN: This is a very pleasant 55 years old [place] American female with:  1)  Metastatic  non-small cell lung cancer, adenocarcinoma with positive EGFR mutation in exon 19 currently undergoing systemic treatment with oral [name] status post 46 months of treatment. The patient is tolerating her treatment fairly well with no significant adverse effects. I recommended for her to continue on [name] 150 mg by mouth daily. I will see her back for follow-up visit in 2 months for reevaluation and repeat blood work as well as repeat CT scan of the chest, abdomen and pelvis.  2) Metastatic bone disease:  The patient will continue on Xgeva 120 mg subcutaneously every 8 weeks.   3) For pain management, she will continue on the current pain medication with oxycodone. I gave her refill of oxycodone today.  4) anemia: She will continue on treatment with Integra plus.  5) insomnia: She is currently on Restoril 15 mg by mouth daily at bedtime when necessary.  The patient voices understanding of current disease status and treatment options and is in agreement with the current care plan.  All questions were  answered. The patient knows to call the clinic with any problems, questions or concerns. We can certainly see the patient much sooner if necessary.  Disclaimer: This note was dictated with voice recognition software. Similar sounding words can inadvertently be transcribed and may be missed upon review.

## 2015-11-12 NOTE — Patient Instructions (Signed)
Denosumab injection  What is this medicine?  DENOSUMAB (den oh sue mab) slows bone breakdown. Prolia is used to treat osteoporosis in women after menopause and in men. Xgeva is used to prevent bone fractures and other bone problems caused by cancer bone metastases. Xgeva is also used to treat giant cell tumor of the bone.  This medicine may be used for other purposes; ask your health care provider or pharmacist if you have questions.  What should I tell my health care provider before I take this medicine?  They need to know if you have any of these conditions:  -dental disease  -eczema  -infection or history of infections  -kidney disease or on dialysis  -low blood calcium or vitamin D  -malabsorption syndrome  -scheduled to have surgery or tooth extraction  -taking medicine that contains denosumab  -thyroid or parathyroid disease  -an unusual reaction to denosumab, other medicines, foods, dyes, or preservatives  -pregnant or trying to get pregnant  -breast-feeding  How should I use this medicine?  This medicine is for injection under the skin. It is given by a health care professional in a hospital or clinic setting.  If you are getting Prolia, a special MedGuide will be given to you by the pharmacist with each prescription and refill. Be sure to read this information carefully each time.  For Prolia, talk to your pediatrician regarding the use of this medicine in children. Special care may be needed. For Xgeva, talk to your pediatrician regarding the use of this medicine in children. While this drug may be prescribed for children as young as 13 years for selected conditions, precautions do apply.  Overdosage: If you think you have taken too much of this medicine contact a poison control center or emergency room at once.  NOTE: This medicine is only for you. Do not share this medicine with others.  What if I miss a dose?  It is important not to miss your dose. Call your doctor or health care professional if you are  unable to keep an appointment.  What may interact with this medicine?  Do not take this medicine with any of the following medications:  -other medicines containing denosumab  This medicine may also interact with the following medications:  -medicines that suppress the immune system  -medicines that treat cancer  -steroid medicines like prednisone or cortisone  This list may not describe all possible interactions. Give your health care provider a list of all the medicines, herbs, non-prescription drugs, or dietary supplements you use. Also tell them if you smoke, drink alcohol, or use illegal drugs. Some items may interact with your medicine.  What should I watch for while using this medicine?  Visit your doctor or health care professional for regular checks on your progress. Your doctor or health care professional may order blood tests and other tests to see how you are doing.  Call your doctor or health care professional if you get a cold or other infection while receiving this medicine. Do not treat yourself. This medicine may decrease your body's ability to fight infection.  You should make sure you get enough calcium and vitamin D while you are taking this medicine, unless your doctor tells you not to. Discuss the foods you eat and the vitamins you take with your health care professional.  See your dentist regularly. Brush and floss your teeth as directed. Before you have any dental work done, tell your dentist you are receiving this medicine.  Do   not become pregnant while taking this medicine or for 5 months after stopping it. Women should inform their doctor if they wish to become pregnant or think they might be pregnant. There is a potential for serious side effects to an unborn child. Talk to your health care professional or pharmacist for more information.  What side effects may I notice from receiving this medicine?  Side effects that you should report to your doctor or health care professional as soon as  possible:  -allergic reactions like skin rash, itching or hives, swelling of the face, lips, or tongue  -breathing problems  -chest pain  -fast, irregular heartbeat  -feeling faint or lightheaded, falls  -fever, chills, or any other sign of infection  -muscle spasms, tightening, or twitches  -numbness or tingling  -skin blisters or bumps, or is dry, peels, or red  -slow healing or unexplained pain in the mouth or jaw  -unusual bleeding or bruising  Side effects that usually do not require medical attention (Report these to your doctor or health care professional if they continue or are bothersome.):  -muscle pain  -stomach upset, gas  This list may not describe all possible side effects. Call your doctor for medical advice about side effects. You may report side effects to FDA at 1-800-FDA-1088.  Where should I keep my medicine?  This medicine is only given in a clinic, doctor's office, or other health care setting and will not be stored at home.  NOTE: This sheet is a summary. It may not cover all possible information. If you have questions about this medicine, talk to your doctor, pharmacist, or health care provider.      2016, Elsevier/Gold Standard. (2011-09-06 12:37:47)

## 2015-11-17 ENCOUNTER — Telehealth: Payer: Self-pay | Admitting: Internal Medicine

## 2015-11-17 NOTE — Telephone Encounter (Signed)
CALLED PATIENT TO CONF APPT INFO PER 08/23 LOS. PATIENT BECAME ANGRY THAT THE LAB APPT AND M.D. WAS NOT [place]. TRIED EXPLAINGING THAT THE LAB WAS TO BE DONE SAME DAY AS Ct, PATIENT DISCONNECTED THE CALL. 11/17/15

## 2015-11-25 ENCOUNTER — Telehealth: Payer: Self-pay | Admitting: *Deleted

## 2015-11-25 ENCOUNTER — Other Ambulatory Visit: Payer: Self-pay | Admitting: Medical Oncology

## 2015-11-25 DIAGNOSIS — C3490 Malignant neoplasm of unspecified part of unspecified bronchus or lung: Secondary | ICD-10-CM

## 2015-11-25 MED ORDER — ERLOTINIB HCL 150 MG PO TABS: 150.0000 mg | ORAL_TABLET | Freq: Every day | ORAL | 1 refills | Status: DC

## 2015-11-25 NOTE — Telephone Encounter (Signed)
Voicemail retrieved earlier by Triage requesting return call.  Called patient, voicemail received.  Message left requesting she call about whatever her concerns are at this time.

## 2015-11-28 ENCOUNTER — Ambulatory Visit: Payer: Medicare Other | Admitting: Family Medicine

## 2015-12-02 ENCOUNTER — Ambulatory Visit: Payer: Medicare Other | Admitting: Family Medicine

## 2015-12-04 ENCOUNTER — Ambulatory Visit (INDEPENDENT_AMBULATORY_CARE_PROVIDER_SITE_OTHER): Payer: Medicare Other

## 2015-12-04 DIAGNOSIS — Z23 Encounter for immunization: Secondary | ICD-10-CM

## 2015-12-05 ENCOUNTER — Telehealth: Payer: Self-pay | Admitting: Family Medicine

## 2015-12-05 ENCOUNTER — Ambulatory Visit (INDEPENDENT_AMBULATORY_CARE_PROVIDER_SITE_OTHER): Payer: Medicare Other | Admitting: Family Medicine

## 2015-12-05 ENCOUNTER — Other Ambulatory Visit: Payer: Self-pay | Admitting: Family Medicine

## 2015-12-05 ENCOUNTER — Encounter: Payer: Self-pay | Admitting: Family Medicine

## 2015-12-05 VITALS — BP 120/86 | Temp 98.0°F | Ht 65.0 in | Wt 274.0 lb

## 2015-12-05 DIAGNOSIS — J209 Acute bronchitis, unspecified: Secondary | ICD-10-CM | POA: Diagnosis not present

## 2015-12-05 MED ORDER — METHYLPREDNISOLONE 4 MG PO TBPK: ORAL_TABLET | ORAL | 0 refills | Status: DC

## 2015-12-05 MED ORDER — LEVOFLOXACIN 500 MG PO TABS: 500.0000 mg | ORAL_TABLET | Freq: Every day | ORAL | 0 refills | Status: AC

## 2015-12-05 NOTE — Progress Notes (Signed)
   Subjective:    Patient ID: Beth [name], female    DOB: [birth year], 55 y.o.   MRN: [id number]  HPI Here for 2 weeks of PND, chest tightness and a dry cough. No fever. Using Combivent several times a day.    Review of Systems  Constitutional: Negative.   HENT: Positive for congestion and postnasal drip. Negative for sinus pressure and sore throat.   Eyes: Negative.   Respiratory: Positive for cough.        Objective:   Physical Exam  Constitutional: She appears well-developed and well-nourished.  HENT:  Right Ear: External ear normal.  Left Ear: External ear normal.  Nose: Nose normal.  Mouth/Throat: Oropharynx is clear and moist.  Eyes: Conjunctivae are normal.  Neck: No thyromegaly present.  Pulmonary/Chest: Effort normal and breath sounds normal. No respiratory distress. She has no wheezes. She has no rales.  Lymphadenopathy:    She has no cervical adenopathy.          Assessment & Plan:  Bronchitis, treat with Levaquin and a Medrol dose pack.  Laurey Morale, MD

## 2015-12-05 NOTE — Telephone Encounter (Signed)
Prednisone 20 mg po bid for 5 days.

## 2015-12-05 NOTE — Progress Notes (Signed)
Pre visit review using our clinic review tool, if applicable. No additional management support is needed unless otherwise documented below in the visit note. 

## 2015-12-05 NOTE — Telephone Encounter (Signed)
° °  Pt call to ask if Dr B will call her in some prednisone for her allergies and wheezing. She said he knows her history

## 2015-12-05 NOTE — Telephone Encounter (Signed)
I called the pt and informed her of the message below and asked what pharmacy she needed this to be sent to and Autumn told me the pt has an appt with Dr Sarajane Jews today.  She asked if she could have an antibiotic sent in also I advised her I would need to send the message to Dr Elease Hashimoto to ask him. As I was in the process of sending this the pt called Malachy Mood with the same question and per Dr Elease Hashimoto she needs to be seen and Malachy Mood informed the pt of this.

## 2015-12-06 ENCOUNTER — Ambulatory Visit: Payer: Medicare Other | Admitting: Family Medicine

## 2015-12-08 ENCOUNTER — Telehealth: Payer: Self-pay | Admitting: Family Medicine

## 2015-12-08 ENCOUNTER — Telehealth: Payer: Self-pay | Admitting: Internal Medicine

## 2015-12-08 ENCOUNTER — Other Ambulatory Visit: Payer: Self-pay | Admitting: *Deleted

## 2015-12-08 DIAGNOSIS — Z5111 Encounter for antineoplastic chemotherapy: Secondary | ICD-10-CM

## 2015-12-08 DIAGNOSIS — C3490 Malignant neoplasm of unspecified part of unspecified bronchus or lung: Secondary | ICD-10-CM

## 2015-12-08 MED ORDER — OXYCODONE HCL 10 MG PO TABS: 10.0000 mg | ORAL_TABLET | Freq: Four times a day (QID) | ORAL | 0 refills | Status: DC | PRN

## 2015-12-08 MED ORDER — PREDNISONE 10 MG PO TABS: ORAL_TABLET | ORAL | 0 refills | Status: DC

## 2015-12-08 NOTE — Telephone Encounter (Signed)
Returned call to patient per wanting scheduling changes. Patient frustrated with schedule and was being very rude. Patient will call Dr. Worthy [name] nurse instead.

## 2015-12-08 NOTE — Telephone Encounter (Signed)
Pt saw dr Sarajane Jews on Friday and was prescribed methylPREDNISolone (MEDROL DOSEPAK) 4 MG TBPK tablet  Pt states that MG is way too low and she usually takes 10-20 MG. Wants Dr Elease Hashimoto to send in another rx with this higher MG.  [facility] [address]

## 2015-12-08 NOTE — Telephone Encounter (Signed)
No. Prednisone and methylprednisolone and not mg equivalent medications.

## 2015-12-08 NOTE — Telephone Encounter (Signed)
Pt is aware that medication has been sent in.  

## 2015-12-08 NOTE — Telephone Encounter (Signed)
Pt is wheezing

## 2015-12-08 NOTE — Telephone Encounter (Signed)
I called the pt and informed her of the message below and she stated she out of the steroid Dr Sarajane Jews gave her and is still wheezing and can barely breathe and I do not understand her history and she needs to have Dr Elease Hashimoto send in Prednisone for her.

## 2015-12-08 NOTE — Telephone Encounter (Signed)
Please Review

## 2015-12-08 NOTE — Telephone Encounter (Signed)
Prednisone 10 mg taper:4-4-3-3-2-2-1-1 and office follow up if not better.

## 2015-12-09 ENCOUNTER — Encounter: Payer: Self-pay | Admitting: Internal Medicine

## 2015-12-09 ENCOUNTER — Other Ambulatory Visit (INDEPENDENT_AMBULATORY_CARE_PROVIDER_SITE_OTHER): Payer: Medicare Other

## 2015-12-09 ENCOUNTER — Ambulatory Visit (INDEPENDENT_AMBULATORY_CARE_PROVIDER_SITE_OTHER): Payer: Medicare Other | Admitting: Internal Medicine

## 2015-12-09 VITALS — BP 128/74 | HR 60 | Ht 65.0 in | Wt 279.0 lb

## 2015-12-09 DIAGNOSIS — J45991 Cough variant asthma: Secondary | ICD-10-CM

## 2015-12-09 LAB — CBC WITH DIFFERENTIAL/PLATELET
BASOS ABS: 0 10*3/uL (ref 0.0–0.1)
Basophils Relative: 0.3 % (ref 0.0–3.0)
EOS ABS: 0.3 10*3/uL (ref 0.0–0.7)
Eosinophils Relative: 3.8 % (ref 0.0–5.0)
HCT: 36 % (ref 36.0–46.0)
Hemoglobin: 12 g/dL (ref 12.0–15.0)
LYMPHS ABS: 2.7 10*3/uL (ref 0.7–4.0)
LYMPHS PCT: 37.5 % (ref 12.0–46.0)
MCHC: 33.3 g/dL (ref 30.0–36.0)
MCV: 93.2 fl (ref 78.0–100.0)
Monocytes Absolute: 0.7 10*3/uL (ref 0.1–1.0)
Monocytes Relative: 10.1 % (ref 3.0–12.0)
NEUTROS ABS: 3.5 10*3/uL (ref 1.4–7.7)
NEUTROS PCT: 48.3 % (ref 43.0–77.0)
PLATELETS: 228 10*3/uL (ref 150.0–400.0)
RBC: 3.86 Mil/uL — AB (ref 3.87–5.11)
RDW: 15.7 % — ABNORMAL HIGH (ref 11.5–15.5)
WBC: 7.3 10*3/uL (ref 4.0–10.5)

## 2015-12-09 LAB — NITRIC OXIDE: NITRIC OXIDE: 8

## 2015-12-09 MED ORDER — FAMOTIDINE 20 MG PO TABS: ORAL_TABLET | ORAL | 2 refills | Status: DC

## 2015-12-09 MED FILL — oxyCODONE HCL 10 MG TABS: 10 | 22 days supply | Qty: 90 | Fill #0

## 2015-12-09 NOTE — Telephone Encounter (Signed)
Pt notified rx ready for pick up.

## 2015-12-09 NOTE — Patient Instructions (Addendum)
Try pepcid 20 mg after breakfast and at bedtime plus For drainage / throat tickle try take CHLORPHENIRAMINE  4 mg - take one every 4 hours as needed - available over the counter- may cause drowsiness so start with just a bedtime dose or two and see how you tolerate it before trying in daytime    If not better go ahead and take prednisone course   If still not better call us and we can add sinus CT to your studies in October   GERD (REFLUX)  is an extremely common cause of respiratory symptoms just like yours , many times with no obvious heartburn at all.    It can be treated with medication, but also with lifestyle changes including elevation of the head of your bed (ideally with 6 inch  bed blocks),  Smoking cessation, avoidance of late meals, excessive alcohol, and avoid fatty foods, chocolate, peppermint, colas, red wine, and acidic juices such as orange juice.  NO MINT OR MENTHOL PRODUCTS SO NO COUGH DROPS   USE SUGARLESS CANDY INSTEAD (Jolley ranchers or Stover's or Life Savers) or even ice chips will also do - the key is to swallow to prevent all throat clearing. NO OIL BASED VITAMINS - use powdered substitutes.  Please remember to go to the lab  department downstairs for your tests - we will call you with the results when they are available.  Please schedule a follow up office visit in 6 weeks, call sooner if needed

## 2015-12-09 NOTE — Progress Notes (Signed)
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 Pulmonary Consult    Self referral. Pt c/o wheezing and cough "on and off for a while". Cough is not usually prod. Wheezing is worse when she lies down.   tendency cough   every since 2008 p moving [place] in 2006  And tried on combivent x last sev years but can't tell it works/ much better on prednisone transiently in past/ last took it in may for her "seasonal allergies worse in fall" Wheezing worse at hs/  Has tried bendadryl/ eyedrops some improvement  Really Not limited by breathing from desired activities     No obvious day to day or daytime variability or assoc excess/ purulent sputum or mucus plugs or hemoptysis or cp or chest tightness, subjective wheeze or overt sinus or hb symptoms. No unusual exp hx or h/o childhood pna/ asthma or knowledge of premature birth.  Sleeping ok without nocturnal  or early am exacerbation  of respiratory  c/o's or need for noct saba. Also denies any obvious fluctuation of symptoms with weather or environmental changes or other aggravating or alleviating factors except as outlined above   Current Medications, Allergies, Complete Past Medical History, Past Surgical History, Family History, and Social History were reviewed in [employer] record.       Review of Systems  Constitutional: Negative for chills, fever and unexpected weight change.  HENT: Positive for postnasal drip, sinus pressure and trouble swallowing. Negative for congestion, dental problem, ear pain, nosebleeds, rhinorrhea, sneezing, sore throat and voice change.   Eyes: Negative for visual disturbance.  Respiratory: Positive for cough.  Negative for choking and shortness of breath.   Cardiovascular: Negative for chest pain and leg swelling.  Gastrointestinal: Negative for abdominal pain, diarrhea and vomiting.  Genitourinary: Negative for difficulty urinating.  Musculoskeletal: Negative for arthralgias.  Skin: Negative for rash.  Neurological: Positive for headaches. Negative for tremors and syncope.  Hematological: Does not bruise/bleed easily.       Objective:   Physical Exam  amb obese wf nad / occ dry cough  Wt Readings from Last 3 Encounters:  12/09/15 279 lb (126.6 kg)  12/05/15 274 lb (124.3 kg)  11/12/15 266 lb (120.7 kg)    Vital signs reviewed   HEENT: nl dentition, turbinates, and oropharynx. Nl external ear canals without cough reflex   NECK :  without JVD/Nodes/TM/ nl carotid upstrokes bilaterally   LUNGS: no acc muscle use,  Nl contour chest which is clear to A and P bilaterally without cough on insp or exp maneuvers   CV:  RRR  no s3 or murmur or increase in P2, no edema   ABD:  soft and nontender with nl inspiratory excursion in the supine position. No bruits or organomegaly, bowel sounds nl  MS:  Nl gait/ ext warm without deformities, calf tenderness, cyanosis or clubbing No obvious joint restrictions   SKIN: warm and dry without lesions    NEURO:  alert, approp, nl sensorium with  no motor deficits    CTa chest 08/08/15  1. No embolus is identified. Specifically in the area of concern in the left lower lobe, there  is no filling defect today. The appearance of possible filling defect on the prior exam may have been from slow flow or other vascular phenomenon. 2. In the right pulmonary artery, particularly in the upper and middle lobe, there is very vague hypodensity in the pulmonary arterial tree which was not readily apparent yesterday. I favor this is being from slow flow/vascular phenomenon probably related to hypoventilation of the right lung. I am very doubtful that  this represents embolus. It does not have the sharply defined appearance than I would expect for acute embolus. 3. Exam is otherwise stable from yesterday.   Labs 12/09/2015 = cbc with diff/ allergy profile      Assessment & Plan:

## 2015-12-10 ENCOUNTER — Telehealth: Payer: Self-pay | Admitting: *Deleted

## 2015-12-10 ENCOUNTER — Ambulatory Visit: Payer: Medicare Other | Admitting: Family Medicine

## 2015-12-10 DIAGNOSIS — H40013 Open angle with borderline findings, low risk, bilateral: Secondary | ICD-10-CM | POA: Diagnosis not present

## 2015-12-10 DIAGNOSIS — H04123 Dry eye syndrome of bilateral lacrimal glands: Secondary | ICD-10-CM | POA: Diagnosis not present

## 2015-12-10 LAB — RESPIRATORY ALLERGY PROFILE REGION II ~~LOC~~
Allergen, A. alternata, m6: <0.1 kU/L
Allergen, Cedar tree, t12: <0.1 kU/L
Allergen, Oak,t7: <0.1 kU/L
Allergen, P. notatum, m1: <0.1 kU/L
Aspergillus fumigatus, m3: <0.1 kU/L
Box Elder IgE: <0.1 kU/L
Cat Dander: <0.1 kU/L
Common Ragweed: <0.1 kU/L
D. farinae: <0.1 kU/L
IGE (IMMUNOGLOBULIN E), SERUM: 2 kU/L (ref <115)
Johnson Grass: <0.1 kU/L
Rough Pigweed  IgE: <0.1 kU/L
Sheep Sorrel IgE: <0.1 kU/L

## 2015-12-10 NOTE — Assessment & Plan Note (Signed)
FENO 12/09/2015  =  7  On no RX  - Allergy profile 12/09/2015 >  Eos 0.3 /  IgE   - try noct pepcid/ h1 first 12/09/2015 >>>  The most common causes of chronic cough in immunocompetent adults include the following: upper airway cough syndrome (UACS), previously referred to as postnasal drip syndrome (PNDS), which is caused by variety of rhinosinus conditions; (2) asthma; (3) GERD; (4) chronic bronchitis from cigarette smoking or other inhaled environmental irritants; (5) nonasthmatic eosinophilic bronchitis; and (6) bronchiectasis.   These conditions, singly or in combination, have accounted for up to 94% of the causes of chronic cough in prospective studies.   Other conditions have constituted no >6% of the causes in prospective studies These have included bronchogenic carcinoma, chronic interstitial pneumonia, sarcoidosis, left ventricular failure, ACEI-induced cough, and aspiration from a condition associated with pharyngeal dysfunction.    Chronic cough is often simultaneously caused by more than one condition. A single cause has been found from 38 to 82% of the time, multiple causes from 18 to 62%. Multiply caused cough has been the result of three diseases up to 42% of the time.       The low feno and completely nl exam off combivent plus very low feno argue against asthma but the pred resp in past suggests this may be UACS related to rhinitis or even chronic sinusitis.  Upper airway cough syndrome (previously labeled PNDS) , is  so named because it's frequently impossible to sort out how much is  CR/sinusitis with freq throat clearing (which can be related to primary GERD)   vs  causing  secondary (" extra esophageal")  GERD from wide swings in gastric pressure that occur with throat clearing, often  promoting self use of mint and menthol lozenges that reduce the lower esophageal sphincter tone and exacerbate the problem further in a cyclical fashion.   These are the same pts (now being labeled  as having "irritable larynx syndrome" by some cough centers) who not infrequently have a history of having failed to tolerate ace inhibitors,  dry powder inhalers or biphosphonates or report having atypical/extraesophageal reflux symptoms that don't respond to standard doses of PPI  and are easily confused as having aecopd or asthma flares by even experienced allergists/ pulmonologists (myself included).   For now rec max rx directed at GERD (can't take PPI on tarceva per [facility] so just pepcid 20 bid) plus 1st gen HI per guidelines and continue combivent prn for now   Total time devoted to counseling  = 35/71mreview case with pt/ discussion of options/alternatives/ personally creating written instructions  in presence of pt  then going over those specific  Instructions directly with the pt including how to use all of the meds but in particular covering each new medication in detail and the difference between the maintenance/automatic meds and the prns using an action plan format for the latter.

## 2015-12-10 NOTE — Progress Notes (Signed)
Spoke with pt and notified of results per Dr. Wert. Pt verbalized understanding and denied any questions. 

## 2015-12-10 NOTE — Telephone Encounter (Signed)
Continue with Imodium and Gas X

## 2015-12-10 NOTE — Telephone Encounter (Signed)
Received call from pt stating that her stomach is bothering her real bad & wants to know what she can take with the tarceva.  She states that it isn't heartburn but pain is mid abdomen.  She reports occ nausea & is having diarrhea a lot > 4 stools/d.  She is taking imodium which may slow it a little.  She is also having some scheduling issues that she would like to discuss with Dr Worthy Flank RN.  Message to Dr Mohamed/Pod RN.  She left a call back # of [phone] -mobile #.

## 2015-12-11 ENCOUNTER — Telehealth: Payer: Self-pay | Admitting: Internal Medicine

## 2015-12-11 NOTE — Telephone Encounter (Signed)
Received message from triage to move lab from 10/23 to 10/26 with ct per patient request. Lab moved per patient request. Spoke with patient she is aware.

## 2015-12-11 NOTE — Telephone Encounter (Signed)
Pt called back today & message per Dr Julien Nordmann was given to pt & message given to Melissa/Scheduling to move lab appt to same day as CT b/c pt doesn't want to come in 3 different days.

## 2015-12-12 ENCOUNTER — Telehealth: Payer: Self-pay | Admitting: Medical Oncology

## 2015-12-12 NOTE — Telephone Encounter (Signed)
More coughing  and wheezing and increased SOB.She saw pulmonologist at [facility] on Tuesday and she was told it was GERD. "Can GERD cause SOB?"  Then she asked if she can get ct scan moved up from oct 26 to sooner "and check for the blood clot -I don"t want to have to go to the ED".  I called pt back and told her CT scan is for Tuesday not Oct and she said "Yeah I know I called central scheduling and changed it".  "Can I have the scan to check for the blood clot on Tuesday?" Routed to [place].

## 2015-12-15 NOTE — Telephone Encounter (Signed)
Discussed with Pt CT tomorrow will detect a blood clot if there is one. Pt verbalized understanding, requested to be transferred to scheduling to change MD appt from late Oct to sooner. Call transferred per pt request.

## 2015-12-16 ENCOUNTER — Ambulatory Visit (HOSPITAL_COMMUNITY)
Admission: RE | Admit: 2015-12-16 | Discharge: 2015-12-16 | Disposition: A | Discharge status: Home / Self Care | Payer: Medicare Other | Source: Ambulatory Visit | Attending: Internal Medicine | Admitting: Internal Medicine

## 2015-12-16 ENCOUNTER — Other Ambulatory Visit (HOSPITAL_BASED_OUTPATIENT_CLINIC_OR_DEPARTMENT_OTHER): Payer: Medicare Other

## 2015-12-16 DIAGNOSIS — C7951 Secondary malignant neoplasm of bone: Secondary | ICD-10-CM | POA: Diagnosis not present

## 2015-12-16 DIAGNOSIS — C3491 Malignant neoplasm of unspecified part of right bronchus or lung: Secondary | ICD-10-CM | POA: Insufficient documentation

## 2015-12-16 DIAGNOSIS — K449 Diaphragmatic hernia without obstruction or gangrene: Secondary | ICD-10-CM | POA: Diagnosis not present

## 2015-12-16 DIAGNOSIS — Z9221 Personal history of antineoplastic chemotherapy: Secondary | ICD-10-CM | POA: Insufficient documentation

## 2015-12-16 DIAGNOSIS — Z5111 Encounter for antineoplastic chemotherapy: Secondary | ICD-10-CM

## 2015-12-16 DIAGNOSIS — R918 Other nonspecific abnormal finding of lung field: Secondary | ICD-10-CM | POA: Diagnosis not present

## 2015-12-16 DIAGNOSIS — I700 Atherosclerosis of aorta: Secondary | ICD-10-CM | POA: Diagnosis not present

## 2015-12-16 LAB — CBC WITH DIFFERENTIAL/PLATELET
BASO%: 0.6 % (ref 0.0–2.0)
BASOS ABS: 0.1 10*3/uL (ref 0.0–0.1)
EOS%: 0.4 % (ref 0.0–7.0)
Eosinophils Absolute: 0 10*3/uL (ref 0.0–0.5)
HEMATOCRIT: 36.9 % (ref 34.8–46.6)
HEMOGLOBIN: 11.9 g/dL (ref 11.6–15.9)
LYMPH#: 2.3 10*3/uL (ref 0.9–3.3)
LYMPH%: 26.2 % (ref 14.0–49.7)
MCH: 30.4 pg (ref 25.1–34.0)
MCHC: 32.2 g/dL (ref 31.5–36.0)
MCV: 94.4 fL (ref 79.5–101.0)
MONO#: 0.6 10*3/uL (ref 0.1–0.9)
MONO%: 6.8 % (ref 0.0–14.0)
NEUT%: 66 % (ref 38.4–76.8)
NEUTROS ABS: 5.8 10*3/uL (ref 1.5–6.5)
Platelets: 221 10*3/uL (ref 145–400)
RBC: 3.91 10*6/uL (ref 3.70–5.45)
RDW: 15.1 % — AB (ref 11.2–14.5)
WBC: 8.7 10*3/uL (ref 3.9–10.3)

## 2015-12-16 LAB — COMPREHENSIVE METABOLIC PANEL
ALBUMIN: 3.6 g/dL (ref 3.5–5.0)
ALK PHOS: 66 U/L (ref 40–150)
ALT: 14 U/L (ref 0–55)
AST: 16 U/L (ref 5–34)
Anion Gap: 10 mEq/L (ref 3–11)
BILIRUBIN TOTAL: 1.05 mg/dL (ref 0.20–1.20)
BUN: 15.6 mg/dL (ref 7.0–26.0)
CALCIUM: 9.5 mg/dL (ref 8.4–10.4)
CO2: 27 mEq/L (ref 22–29)
CREATININE: 0.9 mg/dL (ref 0.6–1.1)
Chloride: 105 mEq/L (ref 98–109)
EGFR: 90 mL/min/{1.73_m2} — ABNORMAL LOW (ref >90)
GLUCOSE: 106 mg/dL (ref 70–140)
Potassium: 3.9 mEq/L (ref 3.5–5.1)
SODIUM: 143 meq/L (ref 136–145)
TOTAL PROTEIN: 7.5 g/dL (ref 6.4–8.3)

## 2015-12-16 MED ORDER — IOPAMIDOL (ISOVUE-300) INJECTION 61%
30.0000 mL | Freq: Once | INTRAVENOUS | Status: AC | PRN
  Administered 2015-12-16: 30 mL via ORAL

## 2015-12-16 MED ORDER — IOPAMIDOL (ISOVUE-300) INJECTION 61%
100.0000 mL | Freq: Once | INTRAVENOUS | Status: AC | PRN
  Administered 2015-12-16: 100 mL via INTRAVENOUS

## 2015-12-17 ENCOUNTER — Telehealth: Payer: Self-pay | Admitting: *Deleted

## 2015-12-17 ENCOUNTER — Ambulatory Visit: Payer: Medicare Other | Admitting: Family Medicine

## 2015-12-17 NOTE — Telephone Encounter (Signed)
"  I've not received a return call yet from my call this morning before 4:00pm.  Could I receive a call with the results."

## 2015-12-17 NOTE — Telephone Encounter (Signed)
Call from patient requesting :"Nurse for yesterday's results and I may need to change my appointment."  Call transferred ext [phone]. Next scheduled F/U 01-19-2016.

## 2015-12-17 NOTE — Telephone Encounter (Signed)
Notified pt of scan results. F/U as scheduled

## 2015-12-18 ENCOUNTER — Telehealth: Payer: Self-pay | Admitting: Family Medicine

## 2015-12-18 ENCOUNTER — Telehealth: Payer: Self-pay | Admitting: Internal Medicine

## 2015-12-18 NOTE — Telephone Encounter (Signed)
Refill OK

## 2015-12-18 NOTE — Telephone Encounter (Signed)
Returned call to patient re message she left wanting to r/s 10/30 f/u to sooner as her ct was performed 5/45 due to complications. Patient given new f/u for 10/18 @ 1:45 pm.

## 2015-12-18 NOTE — Telephone Encounter (Signed)
Pt need new rx ibuprofen 800 mg once a day #30 w/refills [facility]

## 2015-12-19 MED ORDER — IBUPROFEN 800 MG PO TABS: 800.0000 mg | ORAL_TABLET | Freq: Every day | ORAL | 3 refills | Status: DC | PRN

## 2015-12-19 NOTE — Telephone Encounter (Signed)
Rx done. 

## 2015-12-24 ENCOUNTER — Ambulatory Visit: Payer: Medicare Other | Admitting: Internal Medicine

## 2015-12-25 ENCOUNTER — Ambulatory Visit (INDEPENDENT_AMBULATORY_CARE_PROVIDER_SITE_OTHER): Payer: Medicare Other | Admitting: Family Medicine

## 2015-12-25 VITALS — BP 100/80 | HR 100 | Temp 98.1°F | Ht 65.0 in | Wt 272.6 lb

## 2015-12-25 DIAGNOSIS — R110 Nausea: Secondary | ICD-10-CM

## 2015-12-25 DIAGNOSIS — R1012 Left upper quadrant pain: Secondary | ICD-10-CM | POA: Diagnosis not present

## 2015-12-25 DIAGNOSIS — R1011 Right upper quadrant pain: Secondary | ICD-10-CM

## 2015-12-25 NOTE — Progress Notes (Signed)
Subjective:     Patient ID: Beth [name], female   DOB: October 25, 1960, 55 y.o.   MRN: [id number]  HPI Patient seen with concern for some progressive nausea after eating and somewhat poorly localized upper abdominal pain. She was specifically concerned about gallstones. She just had CT abdomen pelvis and chest to follow-up her metastatic lung cancer recently and this showed apparently normal gallbladder. She had some fatty liver changes but otherwise no hepatic findings.  She has not had any vomiting but frequently has nausea after eating. She sometimes has had upper abdominal pains somewhat midline but occasionally right upper quadrant. She has frequent diarrhea which she attributes to [name].  She's had some recent issues with frequent cough and is felt to have some probable reflux issues contributing. She was placed by pulmonary on Pepcid. No history of diabetes. No known history of gastroparesis. Denies any melena. No history of peptic ulcer disease.  Past Medical History:  Diagnosis Date  . Anxiety   . Arthritis of knee, degenerative   . Asthma   . Complication of anesthesia    difficulty waking up  . Depression   . Endocarditis    as teenager  . Fibromyalgia   . GERD (gastroesophageal reflux disease)   . Heart murmur   . History of radiation therapy 09/29/11-11/04/2011   right lung 2700cGy 15 sessions  . Hypertension   . Lung mass    R- ADENOCARCINOMA  . Osseous metastasis ([place]) 09/20/11   per PET scan  . Pleural effusion 08/30/11  . Shortness of breath   . Sleep apnea    no longer using CPAP   Past Surgical History:  Procedure Laterality Date  . THORACENTESIS  09/02/11, 09/09/11   right-side pleural effusion  . TUBAL LIGATION  2002    reports that she quit smoking about 4 years ago. Her smoking use included Cigarettes. She has a 7.50 pack-year smoking history. She has never used smokeless tobacco. She reports that she does not drink alcohol or use drugs. family history  includes Alcohol abuse in her father; Hypertension in her maternal aunt, maternal grandfather, maternal grandmother, maternal uncle, and mother; Sarcoidosis in her sister. Allergies  Allergen Reactions  . Meloxicam     Possible GI bleed     Review of Systems  Constitutional: Negative for chills and fever.  Respiratory: Positive for cough and shortness of breath.   Cardiovascular: Negative for chest pain.  Gastrointestinal: Positive for abdominal pain, diarrhea and nausea. Negative for blood in stool and vomiting.  Neurological: Negative for dizziness and weakness.  Psychiatric/Behavioral: Negative for confusion.       Objective:   Physical Exam  Constitutional: She appears well-developed and well-nourished.  Cardiovascular: Normal rate and regular rhythm.   Pulmonary/Chest: Effort normal.  Abdominal: Soft. Bowel sounds are normal. She exhibits no distension and no mass. There is tenderness. There is no rebound and no guarding.  Patient has tenderness right upper quadrant to palpation. No masses palpated. No guarding or rebound.  Musculoskeletal: She exhibits no edema.       Assessment:     Upper abdominal pain probably right upper quadrant with nausea which is frequent postprandial. Recent CT abdomen and pelvis revealed no gallstones. Doubt gastroparesis. We discussed other possible etiologies such as acalculous cholecystitis    Plan:     -Consider HIDA scan to further evaluate -Avoid high fat meals -Follow-up promptly for any recurrent vomiting, fever, progressive pain or other new findings  [name] MD [facility]

## 2015-12-25 NOTE — Patient Instructions (Signed)
We will call you with HIDA scan.

## 2015-12-25 NOTE — Progress Notes (Signed)
Pre visit review using our clinic review tool, if applicable. No additional management support is needed unless otherwise documented below in the visit note. 

## 2015-12-30 ENCOUNTER — Telehealth: Payer: Self-pay | Admitting: Family Medicine

## 2015-12-30 NOTE — Telephone Encounter (Signed)
Pt is aware of annotations.  

## 2015-12-30 NOTE — Telephone Encounter (Signed)
She has been seen by pulmonary recently for cough variant asthma and recommend follow up with them if cough persists.

## 2015-12-30 NOTE — Telephone Encounter (Signed)
Look at last OV note but did not discuss bronchitis. Please advise if need to be seen.

## 2015-12-30 NOTE — Telephone Encounter (Signed)
Pt was seen on 12-25-15 and has bronchitis again. Pt would like last abx call [facility]

## 2015-12-31 ENCOUNTER — Telehealth: Payer: Self-pay | Admitting: Internal Medicine

## 2015-12-31 ENCOUNTER — Ambulatory Visit: Payer: Medicare Other | Admitting: Family Medicine

## 2015-12-31 ENCOUNTER — Ambulatory Visit: Payer: Medicare Other | Admitting: Psychology

## 2015-12-31 MED ORDER — AMOXICILLIN-POT CLAVULANATE 875-125 MG PO TABS: 1.0000 | ORAL_TABLET | Freq: Two times a day (BID) | ORAL | 0 refills | Status: DC

## 2015-12-31 NOTE — Telephone Encounter (Signed)
So the answer is in the instructions:  "If still not better call us and we can add sinus CT to your studies in October"  We can do the sinus ct now and given her a round of augmentin prior to her ov but make sure either way she keeps f/u appt with all meds in hand  rx Augmentin 875 mg take one pill twice daily  X 10 days - take at breakfast and supper with large glass of water.  It would help reduce the usual side effects (diarrhea and yeast infections) if you ate cultured yogurt at lunch.   If not willing to have the CT we can wait until she tries the abx then discuss it at the [facility]

## 2015-12-31 NOTE — Telephone Encounter (Signed)
Spoke with the pt  She refuses the sinus ct "I know what I have, I don't need this"  Abx sent and nothing she needs to keep her appt with all meds in hand  Nothing further needed

## 2015-12-31 NOTE — Telephone Encounter (Signed)
Spoke with the pt  She states her cough never really improved after last visit  She states that she is feeling very congested in her chest and cough has been non prod  She took pred taper after last visit with no improvement  She is requesting abx  I offered appt and she refused "I have already seen him and I know I have bronchitis and just need abx" She already had CT chest 12/16/15  Here are her last ov recs (which she states she if still following all):  Try pepcid 20 mg after breakfast and at bedtime plus For drainage / throat tickle try take CHLORPHENIRAMINE  4 mg - take one every 4 hours as needed - available over the counter- may cause drowsiness so start with just a bedtime dose or two and see how you tolerate it before trying in daytime     If not better go ahead and take prednisone course    If still not better call us and we can add sinus CT to your studies in October     GERD (REFLUX)  is an extremely common cause of respiratory symptoms just like yours , many times with no obvious heartburn at all.     It can be treated with medication, but also with lifestyle changes including elevation of the head of your bed (ideally with 6 inch  bed blocks),  Smoking cessation, avoidance of late meals, excessive alcohol, and avoid fatty foods, chocolate, peppermint, colas, red wine, and acidic juices such as orange juice.  NO MINT OR MENTHOL PRODUCTS SO NO COUGH DROPS   USE SUGARLESS CANDY INSTEAD (Jolley ranchers or Stover's or Life Savers) or even ice chips will also do - the key is to swallow to prevent all throat clearing. NO OIL BASED VITAMINS - use powdered substitutes.   Please remember to go to the lab  department downstairs for your tests - we will call you with the results when they are available.   Please schedule a follow up office visit in 6 weeks, call sooner if needed

## 2016-01-02 ENCOUNTER — Telehealth: Payer: Self-pay | Admitting: Medical Oncology

## 2016-01-02 DIAGNOSIS — R197 Diarrhea, unspecified: Secondary | ICD-10-CM

## 2016-01-02 MED ORDER — DIPHENOXYLATE-ATROPINE 2.5-0.025 MG PO TABS: 2.0000 | ORAL_TABLET | Freq: Four times a day (QID) | ORAL | 0 refills | Status: DC | PRN

## 2016-01-02 NOTE — Telephone Encounter (Signed)
Increased stools 7-8 /day ( started antibiotic wed , but diarrhea started before) . Takes 1 pack of imodium a day. Asking for something better than imodium.

## 2016-01-02 NOTE — Telephone Encounter (Signed)
Lomotil rx called to pharmacy and pt instructed to alternate with imodium up to 6/day for each drug.

## 2016-01-06 ENCOUNTER — Encounter (HOSPITAL_COMMUNITY): Payer: Medicare Other

## 2016-01-07 ENCOUNTER — Encounter: Payer: Self-pay | Admitting: Internal Medicine

## 2016-01-07 ENCOUNTER — Ambulatory Visit (HOSPITAL_BASED_OUTPATIENT_CLINIC_OR_DEPARTMENT_OTHER): Payer: Medicare Other | Admitting: Internal Medicine

## 2016-01-07 ENCOUNTER — Ambulatory Visit (HOSPITAL_BASED_OUTPATIENT_CLINIC_OR_DEPARTMENT_OTHER): Payer: Medicare Other

## 2016-01-07 ENCOUNTER — Telehealth: Payer: Self-pay | Admitting: Internal Medicine

## 2016-01-07 DIAGNOSIS — Z5111 Encounter for antineoplastic chemotherapy: Secondary | ICD-10-CM

## 2016-01-07 DIAGNOSIS — C3491 Malignant neoplasm of unspecified part of right bronchus or lung: Secondary | ICD-10-CM | POA: Diagnosis not present

## 2016-01-07 DIAGNOSIS — C3490 Malignant neoplasm of unspecified part of unspecified bronchus or lung: Secondary | ICD-10-CM

## 2016-01-07 DIAGNOSIS — C7951 Secondary malignant neoplasm of bone: Secondary | ICD-10-CM

## 2016-01-07 DIAGNOSIS — G893 Neoplasm related pain (acute) (chronic): Secondary | ICD-10-CM | POA: Diagnosis not present

## 2016-01-07 DIAGNOSIS — G4700 Insomnia, unspecified: Secondary | ICD-10-CM

## 2016-01-07 DIAGNOSIS — D649 Anemia, unspecified: Secondary | ICD-10-CM

## 2016-01-07 LAB — COMPREHENSIVE METABOLIC PANEL
ALBUMIN: 3.4 g/dL — AB (ref 3.5–5.0)
ALK PHOS: 68 U/L (ref 40–150)
ALT: 14 U/L (ref 0–55)
ANION GAP: 8 meq/L (ref 3–11)
AST: 18 U/L (ref 5–34)
BILIRUBIN TOTAL: 0.58 mg/dL (ref 0.20–1.20)
BUN: 15.6 mg/dL (ref 7.0–26.0)
CALCIUM: 9.4 mg/dL (ref 8.4–10.4)
CO2: 32 mEq/L — ABNORMAL HIGH (ref 22–29)
Chloride: 106 mEq/L (ref 98–109)
Creatinine: 0.8 mg/dL (ref 0.6–1.1)
GLUCOSE: 101 mg/dL (ref 70–140)
POTASSIUM: 3.8 meq/L (ref 3.5–5.1)
SODIUM: 145 meq/L (ref 136–145)
TOTAL PROTEIN: 7.2 g/dL (ref 6.4–8.3)

## 2016-01-07 LAB — CBC WITH DIFFERENTIAL/PLATELET
BASO%: 0.2 % (ref 0.0–2.0)
BASOS ABS: 0 10*3/uL (ref 0.0–0.1)
EOS ABS: 0.2 10*3/uL (ref 0.0–0.5)
EOS%: 4 % (ref 0.0–7.0)
HCT: 37.5 % (ref 34.8–46.6)
HEMOGLOBIN: 12.2 g/dL (ref 11.6–15.9)
LYMPH%: 32.4 % (ref 14.0–49.7)
MCH: 31 pg (ref 25.1–34.0)
MCHC: 32.5 g/dL (ref 31.5–36.0)
MCV: 95.2 fL (ref 79.5–101.0)
MONO#: 0.6 10*3/uL (ref 0.1–0.9)
MONO%: 9.9 % (ref 0.0–14.0)
NEUT#: 3 10*3/uL (ref 1.5–6.5)
NEUT%: 53.5 % (ref 38.4–76.8)
PLATELETS: 254 10*3/uL (ref 145–400)
RBC: 3.94 10*6/uL (ref 3.70–5.45)
RDW: 14.8 % — AB (ref 11.2–14.5)
WBC: 5.5 10*3/uL (ref 3.9–10.3)
lymph#: 1.8 10*3/uL (ref 0.9–3.3)

## 2016-01-07 MED ORDER — OXYCODONE HCL 10 MG PO TABS: 10.0000 mg | ORAL_TABLET | Freq: Four times a day (QID) | ORAL | 0 refills | Status: DC | PRN

## 2016-01-07 MED FILL — oxyCODONE HCL 10 MG TABS: 10 | 22 days supply | Qty: 90 | Fill #0

## 2016-01-07 NOTE — Progress Notes (Signed)
[facility] [phone]   [phone]  OFFICE PROGRESS NOTE  [name], MD [place] [address]  DIAGNOSIS AND STAGE: Metastatic non-small cell lung cancer, adenocarcinoma with positive EGFR mutation in exon 19 diagnosed in June of 2013   PRIOR THERAPY: palliative radiotherapy to the right lung under the care of Dr. Valere Dross expected to be completed on 10/29/2011.   CURRENT THERAPY:  1. [name] 150 mg by mouth daily started 10/11/2011. Status post 48 months of therapy 2. Xgeva 120 mg subcutaneously every 8 weeks.  CHEMOTHERAPY INTENT: Palliative  CURRENT # OF CHEMOTHERAPY CYCLES: 49 CURRENT ANTIEMETICS: Compazine  CURRENT SMOKING STATUS: Former smoker but quit.  ORAL CHEMOTHERAPY AND CONSENT: Oral [name]  CURRENT BISPHOSPHONATES USE: Xgeva  PAIN MANAGEMENT: well-controlled with oxycodone when necessary.  NARCOTICS INDUCED CONSTIPATION: None  LIVING WILL AND CODE STATUS: Full code.   INTERVAL HISTORY: Beth [name] 55 y.o. female returns to the clinic today for followup visit. The patient is feeling fine today with no specific complaints except for mild fatigue and intermittent diarrhea. She is currently on Imodium and Lomotil with better control of her diarrhea. She lost 3 pounds since her last visit. She denied having any significant chest pain, shortness of breath except with exertion, cough or hemoptysis. She denied having any significant skin rash or diarrhea but has some soreness in her scalp. The patient denied having any significant weight loss or night sweats. She is tolerating her treatment with [name] fairly well. She was concerned about disease recurrence and she had repeat CT scan of the chest, abdomen and pelvis performed last month. She is here for evaluation and discussion of her scan results.  MEDICAL HISTORY: Past Medical History:  Diagnosis Date  . Anxiety   . Arthritis of knee, degenerative    . Asthma   . Complication of anesthesia    difficulty waking up  . Depression   . Endocarditis    as teenager  . Fibromyalgia   . GERD (gastroesophageal reflux disease)   . Heart murmur   . History of radiation therapy 09/29/11-11/04/2011   right lung 2700cGy 15 sessions  . Hypertension   . Lung mass    R- ADENOCARCINOMA  . Osseous metastasis ([place]) 09/20/11   per PET scan  . Pleural effusion 08/30/11  . Shortness of breath   . Sleep apnea    no longer using CPAP    ALLERGIES:  is allergic to meloxicam.  MEDICATIONS:  Current Outpatient Prescriptions  Medication Sig Dispense Refill  . amLODipine (NORVASC) 5 MG tablet TK 1 T PO ONCE A DAY  5  . amoxicillin-clavulanate (AUGMENTIN) 875-125 MG tablet Take 1 tablet by mouth 2 (two) times daily. 20 tablet 0  . COMBIVENT RESPIMAT 20-100 MCG/ACT AERS respimat INHALE 1 PUFF INTO THE LUNGS EVERY 6 HOURS AS NEEDED FOR WHEEZING 4 g 2  . diphenhydrAMINE (BENADRYL) 25 MG tablet Take 25 mg by mouth every 6 (six) hours as needed.    . diphenoxylate-atropine (LOMOTIL) 2.5-0.025 MG tablet Take 2 tablets by mouth 4 (four) times daily as needed for diarrhea or loose stools. Alternate with imodium after each stool up to 8/day. 30 tablet 0  . ergocalciferol (VITAMIN D2) 50000 units capsule Take 1 capsule (50,000 Units total) by mouth once a week. 12 capsule 3  . erlotinib ([name]) 150 MG tablet Take 1 tablet (150 mg total) by mouth daily. Take on an empty stomach 1 hour before meals  or 2 hours after. 30 tablet 1  . famotidine (PEPCID) 20 MG tablet One after bfast and after supper 60 tablet 2  . fish oil-omega-3 fatty acids 1000 MG capsule Take 2 g by mouth daily.    . furosemide (LASIX) 20 MG tablet Take one daily as needed for severe edema. 30 tablet 1  . hydrochlorothiazide (HYDRODIURIL) 25 MG tablet TAKE 1 TABLET BY MOUTH EVERY DAY 90 tablet 2  . ibuprofen (ADVIL,MOTRIN) 800 MG tablet Take 1 tablet (800 mg total) by mouth daily as needed. 30 tablet  3  . loperamide (IMODIUM) 2 MG capsule Take 2 mg by mouth 4 (four) times daily as needed for diarrhea or loose stools.    . Multiple Vitamins-Minerals (MULTIVITAMIN WITH MINERALS) tablet Take 1 tablet by mouth daily.    . mupirocin ointment (BACTROBAN) 2 % APPLY INTO EACH NOSTRIL TWICE DAILY 22 g 0  . Oxycodone HCl 10 MG TABS Take 1 tablet (10 mg total) by mouth every 6 (six) hours as needed. 90 tablet 0  . predniSONE (DELTASONE) 10 MG tablet Take 4 tablets for 2 days, 3 tablets for 2 days, 2 tablets for 2 days and 1 tablet for 2 days. 20 tablet 0  . PREMARIN vaginal cream Place 1 application vaginally 2 (two) times daily.  5  . prochlorperazine (COMPAZINE) 10 MG tablet TAKE ONE TABLET BY MOUTH EVERY 6 HOURS AS NEEDED 30 tablet 1  . temazepam (RESTORIL) 15 MG capsule Take 1 capsule (15 mg total) by mouth at bedtime as needed for sleep. 30 capsule 0  . venlafaxine XR (EFFEXOR-XR) 75 MG 24 hr capsule      No current facility-administered medications for this visit.     SURGICAL HISTORY:  Past Surgical History:  Procedure Laterality Date  . THORACENTESIS  09/02/11, 09/09/11   right-side pleural effusion  . TUBAL LIGATION  2002    REVIEW OF SYSTEMS:  Constitutional: positive for fatigue and weight loss Eyes: negative Ears, nose, mouth, throat, and face: negative Respiratory: positive for dyspnea on exertion Cardiovascular: negative Gastrointestinal: positive for diarrhea Genitourinary:negative Integument/breast: negative Hematologic/lymphatic: negative Musculoskeletal:negative Neurological: negative Behavioral/Psych: negative Endocrine: negative Allergic/Immunologic: negative   PHYSICAL EXAMINATION: General appearance: alert, cooperative and no distress Head: Normocephalic, without obvious abnormality, atraumatic Neck: no adenopathy, no JVD, supple, symmetrical, trachea midline and thyroid not enlarged, symmetric, no tenderness/mass/nodules Lymph nodes: Cervical, supraclavicular,  and axillary nodes normal. Resp: diminished breath sounds RLL and dullness to percussion RLL Back: symmetric, no curvature. ROM normal. No CVA tenderness. Cardio: regular rate and rhythm, S1, S2 normal, no murmur, click, rub or gallop GI: soft, non-tender; bowel sounds normal; no masses,  no organomegaly Extremities: extremities normal, atraumatic, no cyanosis or edema. There is darkening of the skin and swelling of the right fourth toe. Neurologic: Alert and oriented X 3, normal strength and tone. Normal symmetric reflexes. Normal coordination and gait  ECOG PERFORMANCE STATUS: 1 - Symptomatic but completely ambulatory  Blood pressure (!) 163/83, pulse 74, temperature 98.1 F (36.7 C), temperature source Oral, resp. rate 17, height '5\' 5"'  (1.651 m), weight 269 lb 9.6 oz (122.3 kg), last menstrual period 09/28/2011, SpO2 98 %.  LABORATORY DATA: Lab Results  Component Value Date   WBC 8.7 12/16/2015   HGB 11.9 12/16/2015   HCT 36.9 12/16/2015   MCV 94.4 12/16/2015   PLT 221 12/16/2015      Chemistry      Component Value Date/Time   NA 143 12/16/2015 1125   K 3.9  12/16/2015 1125   CL 103 08/09/2015 1242   CL 104 08/18/2012 1154   CO2 27 12/16/2015 1125   BUN 15.6 12/16/2015 1125   CREATININE 0.9 12/16/2015 1125      Component Value Date/Time   CALCIUM 9.5 12/16/2015 1125   ALKPHOS 66 12/16/2015 1125   AST 16 12/16/2015 1125   ALT 14 12/16/2015 1125   BILITOT 1.05 12/16/2015 1125       RADIOGRAPHIC STUDIES: Ct Chest W Contrast  Result Date: 12/16/2015 CLINICAL DATA:  States for lung cancer diagnosed in June of 2013 EXAM: CT CHEST, ABDOMEN, AND PELVIS WITH CONTRAST TECHNIQUE: Multidetector CT imaging of the chest, abdomen and pelvis was performed following the standard protocol during bolus administration of intravenous contrast. CONTRAST:  144m ISOVUE-300 IOPAMIDOL (ISOVUE-300) INJECTION 61%, 345mISOVUE-300 IOPAMIDOL (ISOVUE-300) INJECTION 61% COMPARISON:  08/07/2015  FINDINGS: CT CHEST FINDINGS Cardiovascular: The heart size is normal. There is no pericardial effusion identified aortic atherosclerosis is noted. Mediastinum/Nodes: The trachea appears patent and is midline. Normal appearance of the esophagus. There is no mediastinal or hilar adenopathy. Lungs/Pleura: Again noted is extensive volume loss involving the right lung. There is diffuse pleural thickening overlying the remaining portions of the right lung. The rounded soft tissue attenuating nodule containing central calcification is stable measuring 2.6 cm, image 78 of series 4. Previously noted areas of ground-glass attenuation within the left lung appear improved from the previous exam. Musculoskeletal: Extensive multifocal sclerotic bone metastases are identified. The appearance is similar to the previous examination. CT ABDOMEN PELVIS FINDINGS Hepatobiliary: There is no focal liver abnormality. Hepatic steatosis noted. The gallbladder is within normal limits. Pancreas: Unremarkable. No pancreatic ductal dilatation or surrounding inflammatory changes. Spleen: Normal in size without focal abnormality. Adrenals/Urinary Tract: The left kidney and left adrenal gland appear normal. Inferiorly displaced right adrenal gland and internally rotated right kidney are stable in appearance from previous exam. No mass or hydronephrosis identified. The bladder appears normal. Stomach/Bowel: The stomach is normal. The small bowel loops have a normal course and caliber without obstruction. No pathologic dilatation of the colon. Vascular/Lymphatic: Aortic atherosclerosis.  No adenopathy. Reproductive: Uterus and bilateral adnexa are unremarkable. Other: There is a large right hemi diaphragm defect with herniation of large and small bowel loops into the right hemi thorax. Musculoskeletal: Diffuse multifocal sclerotic bone metastases are again noted. IMPRESSION: 1. Overall stable appearance of the chest abdomen and pelvis compared with  08/07/2015. 2. Post treatment changes in volume loss from the right lung. The partially calcified nodular density within the right lung is stable when compared with previous exam. 3. Similar appearance of widespread sclerotic bone metastases. 4. Right-sided diaphragmatic hernia containing nonobstructed loops of small and large bowel. 5. Aortic atherosclerosis. 6. Near complete resolution of previously noted multifocal areas of ground-glass attenuation within the left lung. Electronically Signed   By: TaKerby Moors.D.   On: 12/16/2015 16:49   Ct Abdomen Pelvis W Contrast  Result Date: 12/16/2015 CLINICAL DATA:  States for lung cancer diagnosed in June of 2013 EXAM: CT CHEST, ABDOMEN, AND PELVIS WITH CONTRAST TECHNIQUE: Multidetector CT imaging of the chest, abdomen and pelvis was performed following the standard protocol during bolus administration of intravenous contrast. CONTRAST:  10041mSOVUE-300 IOPAMIDOL (ISOVUE-300) INJECTION 61%, 44m70mOVUE-300 IOPAMIDOL (ISOVUE-300) INJECTION 61% COMPARISON:  08/07/2015 FINDINGS: CT CHEST FINDINGS Cardiovascular: The heart size is normal. There is no pericardial effusion identified aortic atherosclerosis is noted. Mediastinum/Nodes: The trachea appears patent and is midline. Normal appearance of the esophagus.  There is no mediastinal or hilar adenopathy. Lungs/Pleura: Again noted is extensive volume loss involving the right lung. There is diffuse pleural thickening overlying the remaining portions of the right lung. The rounded soft tissue attenuating nodule containing central calcification is stable measuring 2.6 cm, image 78 of series 4. Previously noted areas of ground-glass attenuation within the left lung appear improved from the previous exam. Musculoskeletal: Extensive multifocal sclerotic bone metastases are identified. The appearance is similar to the previous examination. CT ABDOMEN PELVIS FINDINGS Hepatobiliary: There is no focal liver abnormality. Hepatic  steatosis noted. The gallbladder is within normal limits. Pancreas: Unremarkable. No pancreatic ductal dilatation or surrounding inflammatory changes. Spleen: Normal in size without focal abnormality. Adrenals/Urinary Tract: The left kidney and left adrenal gland appear normal. Inferiorly displaced right adrenal gland and internally rotated right kidney are stable in appearance from previous exam. No mass or hydronephrosis identified. The bladder appears normal. Stomach/Bowel: The stomach is normal. The small bowel loops have a normal course and caliber without obstruction. No pathologic dilatation of the colon. Vascular/Lymphatic: Aortic atherosclerosis.  No adenopathy. Reproductive: Uterus and bilateral adnexa are unremarkable. Other: There is a large right hemi diaphragm defect with herniation of large and small bowel loops into the right hemi thorax. Musculoskeletal: Diffuse multifocal sclerotic bone metastases are again noted. IMPRESSION: 1. Overall stable appearance of the chest abdomen and pelvis compared with 08/07/2015. 2. Post treatment changes in volume loss from the right lung. The partially calcified nodular density within the right lung is stable when compared with previous exam. 3. Similar appearance of widespread sclerotic bone metastases. 4. Right-sided diaphragmatic hernia containing nonobstructed loops of small and large bowel. 5. Aortic atherosclerosis. 6. Near complete resolution of previously noted multifocal areas of ground-glass attenuation within the left lung. Electronically Signed   By: Kerby Moors M.D.   On: 12/16/2015 16:49   ASSESSMENT AND PLAN: This is a very pleasant 54 years old [place] American female with:  1)  Metastatic non-small cell lung cancer, adenocarcinoma with positive EGFR mutation in exon 19 currently undergoing systemic treatment with oral [name] status post 46 months of treatment. The patient is tolerating her treatment fairly well with no significant adverse  effects. The recent CT scan of the chest, abdomen and pelvis showed no evidence for disease progression. I discussed the scan results with the patient today. I recommend for her to continue her treatment with [name] at the same dose. She would have repeat blood work today and in 2 months.  2) Metastatic bone disease:  The patient will continue on Xgeva 120 mg subcutaneously every 8 weeks.   3) For pain management, she will continue on the current pain medication with oxycodone. I gave her refill of oxycodone today.  4) anemia: She will continue on treatment with Integra plus.  5) insomnia: She is currently on Restoril 15 mg by mouth daily at bedtime when necessary.  6) hypertension: The patient was advised to take her blood pressure medication as prescribed by Dr. Elease Hashimoto and also to reconsult with him for adjustment of her medication.  The patient voices understanding of current disease status and treatment options and is in agreement with the current care plan.  All questions were answered. The patient knows to call the clinic with any problems, questions or concerns. We can certainly see the patient much sooner if necessary.  Disclaimer: This note was dictated with voice recognition software. Similar sounding words can inadvertently be transcribed and may be missed upon review.

## 2016-01-07 NOTE — Telephone Encounter (Signed)
Follow up appointments, Labs and Injections scheduled per 01/07/16 los. Patient refused AVS report and appointment schedule. 01/07/16

## 2016-01-12 ENCOUNTER — Telehealth: Payer: Self-pay | Admitting: Medical Oncology

## 2016-01-12 ENCOUNTER — Other Ambulatory Visit: Payer: Medicare Other

## 2016-01-12 DIAGNOSIS — R197 Diarrhea, unspecified: Secondary | ICD-10-CM

## 2016-01-12 NOTE — Telephone Encounter (Signed)
OK 

## 2016-01-12 NOTE — Telephone Encounter (Signed)
Diarrhea better controlled with lomotil . She is still alternating with imodium but is out of lomotil. Requests refill

## 2016-01-13 ENCOUNTER — Other Ambulatory Visit: Payer: Self-pay | Admitting: Medical Oncology

## 2016-01-13 DIAGNOSIS — C3490 Malignant neoplasm of unspecified part of unspecified bronchus or lung: Secondary | ICD-10-CM

## 2016-01-13 MED ORDER — ERLOTINIB HCL 150 MG PO TABS: 150.0000 mg | ORAL_TABLET | Freq: Every day | ORAL | 1 refills | Status: DC

## 2016-01-13 MED ORDER — DIPHENOXYLATE-ATROPINE 2.5-0.025 MG PO TABS: 2.0000 | ORAL_TABLET | Freq: Four times a day (QID) | ORAL | 0 refills | Status: DC | PRN

## 2016-01-13 NOTE — Addendum Note (Signed)
Addended by: Ardeen Garland on: 01/13/2016 09:55 AM   Modules accepted: Orders

## 2016-01-15 ENCOUNTER — Other Ambulatory Visit: Payer: Medicare Other

## 2016-01-15 ENCOUNTER — Ambulatory Visit (HOSPITAL_COMMUNITY): Payer: Medicare Other

## 2016-01-15 ENCOUNTER — Ambulatory Visit: Payer: Medicare Other

## 2016-01-19 ENCOUNTER — Other Ambulatory Visit: Payer: Self-pay | Admitting: Medical Oncology

## 2016-01-19 ENCOUNTER — Encounter: Payer: Self-pay | Admitting: *Deleted

## 2016-01-19 ENCOUNTER — Other Ambulatory Visit: Payer: Self-pay | Admitting: *Deleted

## 2016-01-19 ENCOUNTER — Ambulatory Visit: Payer: Medicare Other | Admitting: Internal Medicine

## 2016-01-19 DIAGNOSIS — C3491 Malignant neoplasm of unspecified part of right bronchus or lung: Secondary | ICD-10-CM

## 2016-01-19 NOTE — Progress Notes (Signed)
Patient was due for Xgeva on the week of 10/16 (given every 8 weeks per MD [facility] note). Patient called to reschedule appointment at that time. Patient called today and wanted to set up her Xgeva appt for 10/31. Appointment made and patient verbalized understanding.

## 2016-01-20 ENCOUNTER — Other Ambulatory Visit: Payer: Self-pay | Admitting: Family Medicine

## 2016-01-20 ENCOUNTER — Ambulatory Visit (HOSPITAL_BASED_OUTPATIENT_CLINIC_OR_DEPARTMENT_OTHER): Payer: Medicare Other

## 2016-01-20 ENCOUNTER — Ambulatory Visit (INDEPENDENT_AMBULATORY_CARE_PROVIDER_SITE_OTHER): Payer: Medicare Other | Admitting: Internal Medicine

## 2016-01-20 ENCOUNTER — Encounter: Payer: Self-pay | Admitting: Internal Medicine

## 2016-01-20 ENCOUNTER — Other Ambulatory Visit (HOSPITAL_BASED_OUTPATIENT_CLINIC_OR_DEPARTMENT_OTHER): Payer: Medicare Other

## 2016-01-20 VITALS — BP 156/80 | HR 82 | Temp 98.1°F | Resp 18

## 2016-01-20 VITALS — BP 122/80 | HR 107 | Ht 67.0 in | Wt 269.0 lb

## 2016-01-20 DIAGNOSIS — C7951 Secondary malignant neoplasm of bone: Secondary | ICD-10-CM

## 2016-01-20 DIAGNOSIS — Z6841 Body Mass Index (BMI) 40.0 and over, adult: Secondary | ICD-10-CM

## 2016-01-20 DIAGNOSIS — C3491 Malignant neoplasm of unspecified part of right bronchus or lung: Secondary | ICD-10-CM

## 2016-01-20 DIAGNOSIS — J45991 Cough variant asthma: Secondary | ICD-10-CM | POA: Diagnosis not present

## 2016-01-20 LAB — CBC WITH DIFFERENTIAL/PLATELET
BASO%: 1 % (ref 0.0–2.0)
BASOS ABS: 0.1 10*3/uL (ref 0.0–0.1)
EOS ABS: 0.2 10*3/uL (ref 0.0–0.5)
EOS%: 2.7 % (ref 0.0–7.0)
HCT: 38.2 % (ref 34.8–46.6)
HGB: 12.6 g/dL (ref 11.6–15.9)
LYMPH%: 27 % (ref 14.0–49.7)
MCH: 30.6 pg (ref 25.1–34.0)
MCHC: 32.9 g/dL (ref 31.5–36.0)
MCV: 92.9 fL (ref 79.5–101.0)
MONO#: 0.6 10*3/uL (ref 0.1–0.9)
MONO%: 9.3 % (ref 0.0–14.0)
NEUT#: 3.7 10*3/uL (ref 1.5–6.5)
NEUT%: 60 % (ref 38.4–76.8)
Platelets: 241 10*3/uL (ref 145–400)
RBC: 4.12 10*6/uL (ref 3.70–5.45)
RDW: 14.5 % (ref 11.2–14.5)
WBC: 6.1 10*3/uL (ref 3.9–10.3)
lymph#: 1.7 10*3/uL (ref 0.9–3.3)

## 2016-01-20 LAB — COMPREHENSIVE METABOLIC PANEL
ALK PHOS: 79 U/L (ref 40–150)
ALT: 13 U/L (ref 0–55)
AST: 19 U/L (ref 5–34)
Albumin: 3.6 g/dL (ref 3.5–5.0)
Anion Gap: 11 mEq/L (ref 3–11)
BUN: 18.1 mg/dL (ref 7.0–26.0)
CHLORIDE: 104 meq/L (ref 98–109)
CO2: 28 meq/L (ref 22–29)
Calcium: 9.2 mg/dL (ref 8.4–10.4)
Creatinine: 0.8 mg/dL (ref 0.6–1.1)
GLUCOSE: 91 mg/dL (ref 70–140)
POTASSIUM: 3.8 meq/L (ref 3.5–5.1)
SODIUM: 142 meq/L (ref 136–145)
Total Bilirubin: 1.11 mg/dL (ref 0.20–1.20)
Total Protein: 7.6 g/dL (ref 6.4–8.3)

## 2016-01-20 MED ORDER — DENOSUMAB 120 MG/1.7ML ~~LOC~~ SOLN
120.0000 mg | Freq: Once | SUBCUTANEOUS | Status: AC
  Administered 2016-01-20: 120 mg via SUBCUTANEOUS
  Filled 2016-01-20: qty 1.7

## 2016-01-20 NOTE — Patient Instructions (Signed)
If cough recurs in near future call for another round of augmentin and to schedule sinus ct then  If you are satisfied with your treatment plan,  let your doctor know and he/she can either refill your medications or you can return here when your prescription runs out.     If in any way you are not 100% satisfied,  please tell us.  If 100% better, tell your friends!  Pulmonary follow up is as needed

## 2016-01-20 NOTE — Progress Notes (Signed)
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 Pulmonary Consult    Self referral. Pt c/o wheezing and cough "on and off for a while". Cough is not usually prod. Wheezing is worse when she lies down.   tendency cough   every since 2008 p moving [place] in 2006  And tried on combivent x last sev years but can't tell it works/ much better on prednisone transiently in past/ last took it in may for her "seasonal allergies worse in fall" Wheezing worse at hs/  Has tried bendadryl/ eyedrops some improvement  Really Not limited by breathing from desired activities   rec Try pepcid 20 mg after breakfast and at bedtime plus For drainage / throat tickle try take CHLORPHENIRAMINE  4 mg  If not better go ahead and take prednisone course  If still not better call us and we can add sinus CT to your studies in October   Called 12/31/15 rx augmentin x 10 days as refused CT      01/20/2016  f/u ov/Beth [name] re: recurrent cough once or twice a year ? Related to sinus dz / rare combivent need Chief Complaint  Patient presents with  . Follow-up    Cough has resolved. No new co's today.  did not improve until augmentin added to rx x 10 days then cleared and refused sinus ct  No obvious day to day or daytime variability or assoc sob or cp or chest tightness, subjective wheeze or overt sinus or hb symptoms. No unusual exp hx or h/o childhood pna/ asthma or knowledge of premature birth.  Sleeping ok without nocturnal  or early am exacerbation  of respiratory  c/o's or need for noct saba. Also denies any obvious fluctuation of symptoms with weather or environmental changes or other  aggravating or alleviating factors except as outlined above   Current Medications, Allergies, Complete Past Medical History, Past Surgical History, Family History, and Social History were reviewed in [employer] record.  ROS  The following are not active complaints unless bolded sore throat, dysphagia, dental problems, itching, sneezing,  nasal congestion or excess/ purulent secretions, ear ache,   fever, chills, sweats, unintended wt loss, classically pleuritic or exertional cp, hemoptysis,  orthopnea pnd or leg swelling, presyncope, palpitations, abdominal pain, anorexia, nausea, vomiting, diarrhea  or change in bowel or bladder habits, change in stools or urine, dysuria,hematuria,  rash, arthralgias, visual complaints, headache, numbness, weakness or ataxia or problems with walking or coordination,  change in mood/affect or memory.                Objective:   Physical Exam  amb obese wf nad     01/20/2016     269  12/09/15 279 lb (126.6 kg)  12/05/15 274 lb (124.3 kg)  11/12/15 266 lb (120.7 kg)    Vital signs reviewed   HEENT: nl dentition, turbinates, and oropharynx. Wax impaction bilaterally  without cough reflex   NECK :  without JVD/Nodes/TM/ nl carotid upstrokes bilaterally   LUNGS: no acc muscle use,  Nl contour chest which is clear to A and P bilaterally  without cough on insp or exp maneuvers   CV:  RRR  no s3 or murmur or increase in P2, no edema   ABD:  soft and nontender with nl inspiratory excursion in the supine position. No bruits or organomegaly, bowel sounds nl  MS:  Nl gait/ ext warm without deformities, calf tenderness, cyanosis or clubbing No obvious joint restrictions   SKIN: warm and dry without lesions    NEURO:  alert, approp, nl sensorium with  no motor deficits        I personally reviewed images and agree with radiology impression as follows:  CT 12/16/15  Chest    1. Overall stable appearance of the chest abdomen and  pelvis compared with 08/07/2015. 2. Post treatment changes in volume loss from the right lung. The partially calcified nodular density within the right lung is stable when compared with previous exam. 3. Similar appearance of widespread sclerotic bone metastases. 4. Right-sided diaphragmatic hernia containing nonobstructed loops of small and large bowel. 5. Aortic atherosclerosis. 6. Near complete resolution of previously noted multifocal areas of ground-glass attenuation within the left lung.        Assessment & Plan:

## 2016-01-20 NOTE — Assessment & Plan Note (Signed)
Body mass index is 42.13  Lab Results  Component Value Date   TSH 0.378 08/09/2015     Contributing to gerd tendency/ doe/reviewed the need and the process to achieve and maintain neg calorie balance > defer f/u primary care including intermittently monitoring thyroid status

## 2016-01-20 NOTE — Assessment & Plan Note (Signed)
FENO 12/09/2015  =   8   On no RX  - Allergy profile 12/09/2015 >  Eos 0.3 /  IgE  2 neg RAST - try noct pepcid/ h1 first 12/09/2015 >>>   I had an extended final summary discussion with the patient reviewing all relevant studies completed to date and  lasting 15 to 20 minutes of a 25 minute visit on the following issues:    Response to abx but not prednisone strongly in favor of underlying sinus dz so if continues to have freq flares will need sinus ct and then ent eval prn   Each maintenance medication was reviewed in detail including most importantly the difference between maintenance and as needed and under what circumstances the prns are to be used.  Please see instructions for details which were reviewed in writing and the patient given a copy.

## 2016-01-20 NOTE — Patient Instructions (Signed)
Denosumab injection  What is this medicine?  DENOSUMAB (den oh sue mab) slows bone breakdown. Prolia is used to treat osteoporosis in women after menopause and in men. Xgeva is used to prevent bone fractures and other bone problems caused by cancer bone metastases. Xgeva is also used to treat giant cell tumor of the bone.  This medicine may be used for other purposes; ask your health care provider or pharmacist if you have questions.  What should I tell my health care provider before I take this medicine?  They need to know if you have any of these conditions:  -dental disease  -eczema  -infection or history of infections  -kidney disease or on dialysis  -low blood calcium or vitamin D  -malabsorption syndrome  -scheduled to have surgery or tooth extraction  -taking medicine that contains denosumab  -thyroid or parathyroid disease  -an unusual reaction to denosumab, other medicines, foods, dyes, or preservatives  -pregnant or trying to get pregnant  -breast-feeding  How should I use this medicine?  This medicine is for injection under the skin. It is given by a health care professional in a hospital or clinic setting.  If you are getting Prolia, a special MedGuide will be given to you by the pharmacist with each prescription and refill. Be sure to read this information carefully each time.  For Prolia, talk to your pediatrician regarding the use of this medicine in children. Special care may be needed. For Xgeva, talk to your pediatrician regarding the use of this medicine in children. While this drug may be prescribed for children as young as 13 years for selected conditions, precautions do apply.  Overdosage: If you think you have taken too much of this medicine contact a poison control center or emergency room at once.  NOTE: This medicine is only for you. Do not share this medicine with others.  What if I miss a dose?  It is important not to miss your dose. Call your doctor or health care professional if you are  unable to keep an appointment.  What may interact with this medicine?  Do not take this medicine with any of the following medications:  -other medicines containing denosumab  This medicine may also interact with the following medications:  -medicines that suppress the immune system  -medicines that treat cancer  -steroid medicines like prednisone or cortisone  This list may not describe all possible interactions. Give your health care provider a list of all the medicines, herbs, non-prescription drugs, or dietary supplements you use. Also tell them if you smoke, drink alcohol, or use illegal drugs. Some items may interact with your medicine.  What should I watch for while using this medicine?  Visit your doctor or health care professional for regular checks on your progress. Your doctor or health care professional may order blood tests and other tests to see how you are doing.  Call your doctor or health care professional if you get a cold or other infection while receiving this medicine. Do not treat yourself. This medicine may decrease your body's ability to fight infection.  You should make sure you get enough calcium and vitamin D while you are taking this medicine, unless your doctor tells you not to. Discuss the foods you eat and the vitamins you take with your health care professional.  See your dentist regularly. Brush and floss your teeth as directed. Before you have any dental work done, tell your dentist you are receiving this medicine.  Do   not become pregnant while taking this medicine or for 5 months after stopping it. Women should inform their doctor if they wish to become pregnant or think they might be pregnant. There is a potential for serious side effects to an unborn child. Talk to your health care professional or pharmacist for more information.  What side effects may I notice from receiving this medicine?  Side effects that you should report to your doctor or health care professional as soon as  possible:  -allergic reactions like skin rash, itching or hives, swelling of the face, lips, or tongue  -breathing problems  -chest pain  -fast, irregular heartbeat  -feeling faint or lightheaded, falls  -fever, chills, or any other sign of infection  -muscle spasms, tightening, or twitches  -numbness or tingling  -skin blisters or bumps, or is dry, peels, or red  -slow healing or unexplained pain in the mouth or jaw  -unusual bleeding or bruising  Side effects that usually do not require medical attention (Report these to your doctor or health care professional if they continue or are bothersome.):  -muscle pain  -stomach upset, gas  This list may not describe all possible side effects. Call your doctor for medical advice about side effects. You may report side effects to FDA at 1-800-FDA-1088.  Where should I keep my medicine?  This medicine is only given in a clinic, doctor's office, or other health care setting and will not be stored at home.  NOTE: This sheet is a summary. It may not cover all possible information. If you have questions about this medicine, talk to your doctor, pharmacist, or health care provider.      2016, Elsevier/Gold Standard. (2011-09-06 12:37:47)

## 2016-01-21 ENCOUNTER — Other Ambulatory Visit: Payer: Self-pay | Admitting: Family Medicine

## 2016-01-21 ENCOUNTER — Ambulatory Visit: Payer: Medicare Other | Admitting: Psychology

## 2016-01-22 ENCOUNTER — Telehealth: Payer: Self-pay | Admitting: *Deleted

## 2016-01-22 NOTE — Telephone Encounter (Signed)
Pt called lm asking about lab results. Labs reviewed with on call MD, CBC/CMET within normal range. Called pt lmovm with results.

## 2016-02-02 ENCOUNTER — Telehealth: Payer: Self-pay | Admitting: Medical Oncology

## 2016-02-02 ENCOUNTER — Other Ambulatory Visit: Payer: Self-pay | Admitting: Medical Oncology

## 2016-02-02 DIAGNOSIS — Z5111 Encounter for antineoplastic chemotherapy: Secondary | ICD-10-CM

## 2016-02-02 DIAGNOSIS — C3490 Malignant neoplasm of unspecified part of unspecified bronchus or lung: Secondary | ICD-10-CM

## 2016-02-02 MED ORDER — OXYCODONE HCL 10 MG PO TABS: 10.0000 mg | ORAL_TABLET | Freq: Four times a day (QID) | ORAL | 0 refills | Status: DC | PRN

## 2016-02-02 NOTE — Telephone Encounter (Signed)
Pain med refill ready for pick up -unable to leave message.

## 2016-02-03 ENCOUNTER — Ambulatory Visit (HOSPITAL_COMMUNITY): Payer: Medicare Other

## 2016-02-03 MED FILL — oxyCODONE HCL 10 MG TABS: 10 | 22 days supply | Qty: 90 | Fill #0

## 2016-02-17 ENCOUNTER — Other Ambulatory Visit: Payer: Self-pay | Admitting: Internal Medicine

## 2016-02-18 ENCOUNTER — Telehealth: Payer: Self-pay | Admitting: *Deleted

## 2016-02-18 DIAGNOSIS — C3490 Malignant neoplasm of unspecified part of unspecified bronchus or lung: Secondary | ICD-10-CM

## 2016-02-18 MED ORDER — ERLOTINIB HCL 150 MG PO TABS: 150.0000 mg | ORAL_TABLET | Freq: Every day | ORAL | 1 refills | Status: DC

## 2016-02-18 NOTE — Telephone Encounter (Signed)
"  This is Beth [name] calling about status of [name] refill we faxed.  The last order was sent January 30, 2016 and she has no further refills."  This nurse provided refill information.  Next scheduled F/U is 03-17-2016.

## 2016-02-19 ENCOUNTER — Telehealth: Payer: Self-pay | Admitting: *Deleted

## 2016-02-19 NOTE — Telephone Encounter (Signed)
No need for scan sooner. Her last scan was good. She can see Cyndee earlier if needed.

## 2016-02-19 NOTE — Telephone Encounter (Signed)
Pain in rIght upper side and left lower side near my hips, I've been really congested, night sweats, chills.  Denies fever, continues to be able to eat and drink. Denies chest pain or difficulty breathing. Pt taking 4 hour allergy medication, taking advil for back/hip pain. Pt is concerned the " medicine" might be hurting my kidneys. Pt denied having pain with urination, frequency or urgency. Pt called with these concerns and has requested to be seen the week of Dec 11 instead of Dec 27 and will she need a CT scan prior to coming.   Discussed with pt I will ask MD if he can see her this week and is scan needed prior. Message routed to MD.

## 2016-02-20 NOTE — Telephone Encounter (Signed)
Pt notified and she will keep her appt on 27th. She declined [facility]

## 2016-02-23 ENCOUNTER — Telehealth: Payer: Self-pay | Admitting: Internal Medicine

## 2016-02-23 NOTE — Telephone Encounter (Signed)
LMTCB

## 2016-02-24 MED ORDER — AMOXICILLIN-POT CLAVULANATE 875-125 MG PO TABS: 1.0000 | ORAL_TABLET | Freq: Two times a day (BID) | ORAL | 0 refills | Status: DC

## 2016-02-24 NOTE — Telephone Encounter (Signed)
Ok for augmentin x 10 days then CT sinus at end of rx  I can't order Dr Lew Dawes CT but once we have a date for ours it's fine with me if she call's Dr Lew Dawes office and has their ct changed to same date as ours

## 2016-02-24 NOTE — Telephone Encounter (Signed)
Called and spoke with pt and she is aware of rx for augmentin that has been sent to the pharmacy.  Pt will find out when the ct will be scheduled from Dr. Lew Dawes office and will call us back to let us know.  Nothing further is needed.

## 2016-02-24 NOTE — Telephone Encounter (Signed)
MW  Please Advise  Pt. Called in and requesting her a refill of her Augmentin, also she said she agreed  to the sinus ct. She did state she usually gets a CT scan every 4 months. She wanted to know if it was ok if she gets this done at the same time she will have her next one that [facility].   Instructions   Patient Instructions     If cough recurs in near future call for another round of augmentin and to schedule sinus ct then  If you are satisfied with your treatment plan,  let your doctor know and he/she can either refill your medications or you can return here when your prescription runs out.     If in any way you are not 100% satisfied,  please tell us.  If 100% better, tell your friends!  Pulmonary follow up is as needed

## 2016-02-25 ENCOUNTER — Ambulatory Visit: Payer: Medicare Other | Admitting: Family Medicine

## 2016-02-25 ENCOUNTER — Telehealth: Payer: Self-pay | Admitting: *Deleted

## 2016-02-25 NOTE — Telephone Encounter (Signed)
"  I need to know if I have scans scheduled.  Have tye orders been put in yet and when my last scan was.  I am to have scans every four months."  Last scan was 12-16-2015.  No further questions.

## 2016-02-27 ENCOUNTER — Other Ambulatory Visit: Payer: Self-pay | Admitting: *Deleted

## 2016-02-27 ENCOUNTER — Ambulatory Visit (INDEPENDENT_AMBULATORY_CARE_PROVIDER_SITE_OTHER): Payer: Medicare Other | Admitting: Family Medicine

## 2016-02-27 VITALS — BP 124/88 | HR 128 | Temp 98.5°F | Ht 67.0 in | Wt 264.0 lb

## 2016-02-27 DIAGNOSIS — M545 Low back pain, unspecified: Secondary | ICD-10-CM

## 2016-02-27 DIAGNOSIS — Z5111 Encounter for antineoplastic chemotherapy: Secondary | ICD-10-CM

## 2016-02-27 DIAGNOSIS — C3490 Malignant neoplasm of unspecified part of unspecified bronchus or lung: Secondary | ICD-10-CM

## 2016-02-27 DIAGNOSIS — R350 Frequency of micturition: Secondary | ICD-10-CM

## 2016-02-27 LAB — POCT URINALYSIS DIPSTICK
Blood, UA: NEGATIVE
Glucose, UA: NEGATIVE
LEUKOCYTES UA: NEGATIVE
NITRITE UA: NEGATIVE
PH UA: 5.5
Spec Grav, UA: 1.025
Urobilinogen, UA: 0.2

## 2016-02-27 MED ORDER — OXYCODONE HCL 10 MG PO TABS: 10.0000 mg | ORAL_TABLET | Freq: Four times a day (QID) | ORAL | 0 refills | Status: DC | PRN

## 2016-02-27 NOTE — Telephone Encounter (Signed)
Pt called states " I'd like to refill my oxycodone and pick up next week" Discussed with pt, we will call her when Rx is ready for pick up.

## 2016-02-27 NOTE — Progress Notes (Signed)
Subjective:     Patient ID: Beth [name], female   DOB: 09/22/1960, 55 y.o.   MRN: [id number]  HPI  Patient seen with concern for possible UTI. She has had some urine frequency and mild suprapubic pressure over the past few days. She has had occasional chills but no fever. She denies any burning with urination. She has also had some low back pain which has been somewhat bilateral lower lumbar region. No radiculopathy symptoms. She describes pain as being augmented with bending and movement. She has not had history of frequent UTIs in the past. Denies any nausea or vomiting.  Past Medical History:  Diagnosis Date  . Anxiety   . Arthritis of knee, degenerative   . Asthma   . Complication of anesthesia    difficulty waking up  . Depression   . Endocarditis    as teenager  . Fibromyalgia   . GERD (gastroesophageal reflux disease)   . Heart murmur   . History of radiation therapy 09/29/11-11/04/2011   right lung 2700cGy 15 sessions  . Hypertension   . Lung mass    R- ADENOCARCINOMA  . Osseous metastasis ([place]) 09/20/11   per PET scan  . Pleural effusion 08/30/11  . Shortness of breath   . Sleep apnea    no longer using CPAP   Past Surgical History:  Procedure Laterality Date  . THORACENTESIS  09/02/11, 09/09/11   right-side pleural effusion  . TUBAL LIGATION  2002    reports that she quit smoking about 5 years ago. Her smoking use included Cigarettes. She has a 7.50 pack-year smoking history. She has never used smokeless tobacco. She reports that she does not drink alcohol or use drugs. family history includes Alcohol abuse in her father; Hypertension in her maternal aunt, maternal grandfather, maternal grandmother, maternal uncle, and mother; Sarcoidosis in her sister. Allergies  Allergen Reactions  . Meloxicam     Possible GI bleed    Review of Systems  Constitutional: Negative for appetite change, chills and fever.  Respiratory: Negative for cough.   Gastrointestinal:  Negative for abdominal pain, constipation, diarrhea, nausea and vomiting.  Genitourinary: Positive for dysuria and frequency. Negative for difficulty urinating and hematuria.  Musculoskeletal: Negative for back pain.  Neurological: Negative for dizziness.       Objective:   Physical Exam  Constitutional: She appears well-developed and well-nourished.  HENT:  Mouth/Throat: Oropharynx is clear and moist.  Cardiovascular: Normal rate and regular rhythm.   Pulmonary/Chest: Effort normal and breath sounds normal. No respiratory distress. She has no wheezes. She has no rales.       Assessment:     #1 urine frequency. Urine dipstick reveals some ketones but negative for nitrites, blood, and leukocytes. Doubt infection. She does have some ketones which suggest some dehydration  #2 low back pain probably not related to #1 above    Plan:     -No indication for antibiotic at this time -Stay well-hydrated -Conservative measures for low back pain with heat and/or ice and anti-inflammatory as needed  [name] MD [facility]

## 2016-02-27 NOTE — Progress Notes (Signed)
Pre visit review using our clinic review tool, if applicable. No additional management support is needed unless otherwise documented below in the visit note. 

## 2016-02-28 ENCOUNTER — Encounter: Payer: Self-pay | Admitting: Family Medicine

## 2016-02-28 ENCOUNTER — Ambulatory Visit (INDEPENDENT_AMBULATORY_CARE_PROVIDER_SITE_OTHER): Payer: Medicare Other | Admitting: Family Medicine

## 2016-02-28 VITALS — BP 120/84 | HR 108 | Temp 98.4°F | Ht 67.0 in | Wt 272.8 lb

## 2016-02-28 DIAGNOSIS — Z8679 Personal history of other diseases of the circulatory system: Secondary | ICD-10-CM | POA: Diagnosis not present

## 2016-02-28 DIAGNOSIS — J069 Acute upper respiratory infection, unspecified: Secondary | ICD-10-CM | POA: Insufficient documentation

## 2016-02-28 DIAGNOSIS — R3989 Other symptoms and signs involving the genitourinary system: Secondary | ICD-10-CM | POA: Diagnosis not present

## 2016-02-28 DIAGNOSIS — R011 Cardiac murmur, unspecified: Secondary | ICD-10-CM | POA: Diagnosis not present

## 2016-02-28 LAB — POC URINALSYSI DIPSTICK (AUTOMATED)
Bilirubin, UA: NEGATIVE
Blood, UA: NEGATIVE
Glucose, UA: NEGATIVE
KETONES UA: NEGATIVE
LEUKOCYTES UA: NEGATIVE
Nitrite, UA: NEGATIVE
PROTEIN UA: POSITIVE
Spec Grav, UA: 1.025
Urobilinogen, UA: 0.2
pH, UA: 6

## 2016-02-28 MED ORDER — NONFORMULARY OR COMPOUNDED ITEM: 0 refills | Status: AC

## 2016-02-28 NOTE — Addendum Note (Signed)
Addended by: Aviva Signs M on: 02/28/2016 04:08 PM   Modules accepted: Orders

## 2016-02-28 NOTE — Assessment & Plan Note (Addendum)
No fever -- she has had headache but has uri symptoms too Lab orders put in for Monday and echo--- pt was very persistant about having echo ordered and not waiting to see pcp Pt instructed to go to ER if symptoms worsen

## 2016-02-28 NOTE — Progress Notes (Signed)
Subjective:    Patient ID: Beth [name], female    DOB: [birth year], 55 y.o.   MRN: [id number]  Chief Complaint  Patient presents with  . Fatigue  . Headache  . Cough  . muscle aches    HPI Patient is in today with c/o fevers, chills, headaches, ---- she is taking otc antihistamine but is concerned because she has a hx of endocarditis and feels like she may have it again.  She was seen yesterday by pcp for urinary symptoms and it was neg.  Pt is asking for blood work and echo today.  When I explained we cant do that on a sat she became angry and argumentative.  I explained that I could order some basic labs across the st and put order in for echo but she would have to go to lab at [facility] to have them done.  She calmed down and said she would wait until Monday.        Past Medical History:  Diagnosis Date  . Anxiety   . Arthritis of knee, degenerative   . Asthma   . Complication of anesthesia    difficulty waking up  . Depression   . Endocarditis    as teenager  . Fibromyalgia   . GERD (gastroesophageal reflux disease)   . Heart murmur   . History of radiation therapy 09/29/11-11/04/2011   right lung 2700cGy 15 sessions  . Hypertension   . Lung mass    R- ADENOCARCINOMA  . Osseous metastasis ([place]) 09/20/11   per PET scan  . Pleural effusion 08/30/11  . Shortness of breath   . Sleep apnea    no longer using CPAP    Past Surgical History:  Procedure Laterality Date  . THORACENTESIS  09/02/11, 09/09/11   right-side pleural effusion  . TUBAL LIGATION  2002    Family History  Problem Relation Age of Onset  . Alcohol abuse Father   . Hypertension Mother   . Hypertension Maternal Aunt   . Hypertension Maternal Uncle   . Hypertension Maternal Grandmother   . Hypertension Maternal Grandfather   . Sarcoidosis Sister     Social History   Social History  . Marital status: Single    Spouse name: N/A  . Number of children: N/A  . Years of education: N/A   Occupational  History  . Not on file.   Social History Main Topics  . Smoking status: Former Smoker    Packs/day: 0.50    Years: 15.00    Types: Cigarettes    Quit date: 01/01/2011  . Smokeless tobacco: Never Used  . Alcohol use No     Comment: per H&P, used to drink alcohol regularly  . Drug use: No  . Sexual activity: Not Currently     Comment: still having menses due this week, menses started age 25, g1,p1, no hrt, has hot flashes   Other Topics Concern  . Not on file   Social History Narrative   Divorced, 1 son, worked for [employer]    Outpatient Medications Prior to Visit  Medication Sig Dispense Refill  . albuterol-ipratropium (COMBIVENT) 18-103 MCG/ACT inhaler Inhale 1 puff into the lungs every 6 (six) hours as needed for wheezing or shortness of breath.    Marland Kitchen amLODipine (NORVASC) 5 MG tablet TAKE 1 TABLET BY MOUTH EVERY DAY 90 tablet 1  . COMBIVENT RESPIMAT 20-100 MCG/ACT AERS respimat INHALE 1 PUFF INTO THE LUNGS EVERY 6 HOURS AS NEEDED FOR WHEEZING 4 g  3  . diphenhydrAMINE (BENADRYL) 25 MG tablet Take 25 mg by mouth every 6 (six) hours as needed.    . diphenoxylate-atropine (LOMOTIL) 2.5-0.025 MG tablet Take 2 tablets by mouth 4 (four) times daily as needed for diarrhea or loose stools. Alternate with imodium after each stool up to 8/day. 30 tablet 0  . ergocalciferol (VITAMIN D2) 50000 units capsule Take 1 capsule (50,000 Units total) by mouth once a week. 12 capsule 3  . erlotinib ([name]) 150 MG tablet Take 1 tablet (150 mg total) by mouth daily. Take on an empty stomach 1 hour before meals or 2 hours after. 30 tablet 1  . famotidine (PEPCID) 20 MG tablet One after bfast and after supper 60 tablet 2  . fish oil-omega-3 fatty acids 1000 MG capsule Take 2 g by mouth daily.    . furosemide (LASIX) 20 MG tablet Take one daily as needed for severe edema. 30 tablet 1  . hydrochlorothiazide (HYDRODIURIL) 25 MG tablet TAKE 1 TABLET BY MOUTH EVERY DAY 90 tablet 2  . ibuprofen (ADVIL,MOTRIN) 800  MG tablet Take 1 tablet (800 mg total) by mouth daily as needed. 30 tablet 3  . loperamide (IMODIUM) 2 MG capsule Take 2 mg by mouth 4 (four) times daily as needed for diarrhea or loose stools.    . Multiple Vitamins-Minerals (MULTIVITAMIN WITH MINERALS) tablet Take 1 tablet by mouth daily.    . mupirocin ointment (BACTROBAN) 2 % APPLY INTO EACH NOSTRIL TWICE DAILY 22 g 0  . Oxycodone HCl 10 MG TABS Take 1 tablet (10 mg total) by mouth every 6 (six) hours as needed. 90 tablet 0  . PREMARIN vaginal cream Place 1 application vaginally 2 (two) times daily.  5  . prochlorperazine (COMPAZINE) 10 MG tablet TAKE ONE TABLET BY MOUTH EVERY 6 HOURS AS NEEDED 30 tablet 1  . temazepam (RESTORIL) 15 MG capsule Take 1 capsule (15 mg total) by mouth at bedtime as needed for sleep. 30 capsule 0  . venlafaxine XR (EFFEXOR-XR) 75 MG 24 hr capsule      No facility-administered medications prior to visit.     Allergies  Allergen Reactions  . Meloxicam     Possible GI bleed    Review of Systems  Constitutional: Positive for chills, fever and malaise/fatigue. Negative for diaphoresis and weight loss.  HENT: Positive for congestion. Negative for hearing loss.   Eyes: Negative for blurred vision, double vision, photophobia and discharge.  Respiratory: Positive for cough. Negative for sputum production and shortness of breath.   Cardiovascular: Negative for chest pain, palpitations and leg swelling.  Gastrointestinal: Negative for abdominal pain, blood in stool, constipation, diarrhea, heartburn, nausea and vomiting.  Genitourinary: Negative for dysuria, frequency, hematuria and urgency.  Musculoskeletal: Positive for myalgias. Negative for back pain and falls.  Skin: Negative for itching and rash.  Neurological: Positive for headaches. Negative for dizziness, sensory change, loss of consciousness and weakness.  Endo/Heme/Allergies: Negative for environmental allergies. Does not bruise/bleed easily.    Psychiatric/Behavioral: Negative for depression and suicidal ideas. The patient is not nervous/anxious and does not have insomnia.        Objective:    Physical Exam  Constitutional: She is oriented to person, place, and time. She appears well-developed and well-nourished.  HENT:  Head: Normocephalic and atraumatic.  Eyes: Conjunctivae and EOM are normal.  Neck: Normal range of motion. Neck supple. No JVD present. Carotid bruit is not present. No thyromegaly present.  Cardiovascular: Normal rate and regular rhythm.  Murmur heard. Pulmonary/Chest: Effort normal and breath sounds normal. No respiratory distress. She has no wheezes. She has no rales. She exhibits no tenderness.  Musculoskeletal: She exhibits tenderness. She exhibits no edema.  Neurological: She is alert and oriented to person, place, and time.  Skin: No lesion and no rash noted. No cyanosis. Nails show no clubbing.     Psychiatric: She has a normal mood and affect. Her behavior is normal. Judgment and thought content normal.  Nursing note and vitals reviewed.   BP 120/84 (BP Location: Left Arm, Patient Position: Sitting, Cuff Size: Large)   Pulse (!) 108   Temp 98.4 F (36.9 C) (Oral)   Ht '5\' 7"'  (1.702 m)   Wt 272 lb 12 oz (123.7 kg)   LMP 09/28/2011   SpO2 95%   BMI 42.72 kg/m  Wt Readings from Last 3 Encounters:  02/28/16 272 lb 12 oz (123.7 kg)  02/27/16 264 lb (119.7 kg)  01/20/16 269 lb (122 kg)     Lab Results  Component Value Date   WBC 6.1 01/20/2016   HGB 12.6 01/20/2016   HCT 38.2 01/20/2016   PLT 241 01/20/2016   GLUCOSE 91 01/20/2016   CHOL 212 (H) 11/13/2014   TRIG 49.0 11/13/2014   HDL 81.60 11/13/2014   LDLCALC 120 (H) 11/13/2014   ALT 13 01/20/2016   AST 19 01/20/2016   NA 142 01/20/2016   K 3.8 01/20/2016   CL 103 08/09/2015   CREATININE 0.8 01/20/2016   BUN 18.1 01/20/2016   CO2 28 01/20/2016   TSH 0.378 08/09/2015   INR 1.24 10/08/2011    Lab Results  Component Value  Date   TSH 0.378 08/09/2015   Lab Results  Component Value Date   WBC 6.1 01/20/2016   HGB 12.6 01/20/2016   HCT 38.2 01/20/2016   MCV 92.9 01/20/2016   PLT 241 01/20/2016   Lab Results  Component Value Date   NA 142 01/20/2016   K 3.8 01/20/2016   CHLORIDE 104 01/20/2016   CO2 28 01/20/2016   GLUCOSE 91 01/20/2016   BUN 18.1 01/20/2016   CREATININE 0.8 01/20/2016   BILITOT 1.11 01/20/2016   ALKPHOS 79 01/20/2016   AST 19 01/20/2016   ALT 13 01/20/2016   PROT 7.6 01/20/2016   ALBUMIN 3.6 01/20/2016   CALCIUM 9.2 01/20/2016   ANIONGAP 11 01/20/2016   EGFR >90 01/20/2016   GFR 104.28 08/31/2011   Lab Results  Component Value Date   CHOL 212 (H) 11/13/2014   Lab Results  Component Value Date   HDL 81.60 11/13/2014   Lab Results  Component Value Date   LDLCALC 120 (H) 11/13/2014   Lab Results  Component Value Date   TRIG 49.0 11/13/2014   Lab Results  Component Value Date   CHOLHDL 3 11/13/2014   No results found for: HGBA1C     Assessment & Plan:   Problem List Items Addressed This Visit      Unprioritized   Acute upper respiratory infection    con't otc antihistamine Can use otc steroid nasal spray as well  f/u pcp if symptoms worsens      Hx of bacterial endocarditis - Primary    No fever -- she has had headache but has uri symptoms too Lab orders put in for Monday and echo--- pt was very persistant about having echo ordered and not waiting to see pcp Pt instructed to go to ER if symptoms worsen  Relevant Medications   NONFORMULARY OR COMPOUNDED ITEM   Other Relevant Orders   ECHOCARDIOGRAM COMPLETE   CBC with Differential/Platelet   Comprehensive metabolic panel   MURMUR    Congenital -- not new Hard to here when pt first seen but after she sat and calmed down for a few minutes it was easy to hear 1-2/6 murmur       Other Visit Diagnoses    Murmur, cardiac       Relevant Medications   NONFORMULARY OR COMPOUNDED ITEM    Other Relevant Orders   ECHOCARDIOGRAM COMPLETE   CBC with Differential/Platelet   Comprehensive metabolic panel      I am having Ms. Kirn start on NONFORMULARY OR COMPOUNDED ITEM. I am also having her maintain her fish oil-omega-3 fatty acids, multivitamin with minerals, loperamide, furosemide, temazepam, prochlorperazine, hydrochlorothiazide, PREMARIN, ergocalciferol, mupirocin ointment, diphenhydrAMINE, famotidine, ibuprofen, venlafaxine XR, diphenoxylate-atropine, amLODipine, albuterol-ipratropium, COMBIVENT RESPIMAT, erlotinib, and Oxycodone HCl.  Meds ordered this encounter  Medications  . NONFORMULARY OR COMPOUNDED ITEM    Sig: Cbcd, cmp,ua   Dx hx fever, hx endocarditis,    Dispense:  1 each    Refill:  0     Ann Held, DO

## 2016-02-28 NOTE — Assessment & Plan Note (Signed)
Congenital -- not new Hard to here when pt first seen but after she sat and calmed down for a few minutes it was easy to hear 1-2/6 murmur

## 2016-02-28 NOTE — Assessment & Plan Note (Signed)
con't otc antihistamine Can use otc steroid nasal spray as well  f/u pcp if symptoms worsens

## 2016-02-28 NOTE — Progress Notes (Signed)
Pre visit review using our clinic review tool, if applicable. No additional management support is needed unless otherwise documented below in the visit note. 

## 2016-02-28 NOTE — Patient Instructions (Signed)
Endocarditis Endocarditis is an infection of the inner layer of the heart (endocardium) or an infection of the heart valves. Endocarditis can cause growths inside the heart or on the heart valves. Over time, these growths can destroy heart tissue and cause heart failure or problems with heart rhythm. They can also cause stroke if they break away and form a blood clot in the brain. Early treatment offers the best chance for curing endocarditis and preventing complications. What are the causes? This condition may be caused by:  Germs that normally live in or on your body. The germs that most commonly cause endocarditis are bacteria.  A fungus. What increases the risk? This condition is more likely to develop in people who have:  A heart defect.  Artificial (prosthetic) heart valves.  An abnormal or damaged heart valve.  A history of endocarditis. Having certain procedures may also increase the risk of germs getting into the heart or bloodstream. What are the signs or symptoms? Symptoms of this condition may start suddenly, or they may start slowly and gradually get worse. Symptoms include:  Fever.  Chills.  Night sweats.  Muscle aches.  Fatigue.  Weakness.  Shortness of breath.  Chest pain.  Blood spots in the eyes.  Bleeding under the fingernails or toenails.  Painless red spots on the palms.  Painful lumps in the fingertips or toes.  Swelling in the feet or ankles. How is this diagnosed? This condition may be diagnosed based on:  A physical exam. Your health care provider will listen to your heart to check for abnormal heart sounds (murmur). He or she may also use a scope to check for bleeding at the back of your eyes (retinas).  Tests. They may include:  Blood tests to look for the germs that cause endocarditis.  Imaging tests. A chest x-ray, CT scan, or echocardiogram may be used to create an image of your heart. A type of echocardiogram called a  transesophageal echocardiogram may be done to look at certain heart valves more closely. How is this treated? Treatment for this condition depends on the cause of the endocarditis. Treatment may include:  Antibiotic medicines. These may be given through a tube into one of your veins (IV antibiotics) or taken by mouth. You may need to be on more than one antibiotic medicine.  Surgery to replace your heart valve. You may need surgery if:  The endocarditis does not respond to treatment.  You develop complications.  Your heart valve is severely damaged. Follow these instructions at home: Medicines  Take over-the-counter and prescription medicines only as told by your health care provider.  If you were prescribed an antibiotic medicine, take it as told by your health care provider. Do not stop taking the antibiotic even if you start to feel better. You may need to be on intravenous antibiotics for several weeks.  Do not use IV drugs unless it is part of your medical treatment. Lifestyle  Do not get tattoos or body piercings.  Practice good oral hygiene. This includes:  Brushing and flossing regularly.  Scheduling routine dental appointments.  Do not use any products that contain nicotine or tobacco, such as cigarettes and e-cigarettes. If you need help quitting, ask your health care provider.  Limit alcohol intake to no more than 1 drink a day for nonpregnant women and 2 drinks a day for men. One drink equals 12 oz of beer, 5 oz of wine, or 1 oz of hard liquor. General instructions  Let your  health care provider know before you have any dental or surgical procedures. You may need to take antibiotics before the procedure.  Tell all of your health care providers, including your dentist, that you have had endocarditis.  Gradually resume your usual activities.  Keep all follow-up visits as told by your health care provider. This is important. Contact a health care provider  if:  You have a fever.  Your symptoms do not improve.  Your symptoms get worse.  Your symptoms come back. Get help right away if:  You have trouble breathing.  You have chest pain.  You have symptoms of a stroke. These include:  Sudden weakness.  Numbness.  Confusion.  Trouble talking or understanding.  A severe headache. Summary  Endocarditis is an infection of the inner layer of the heart (endocardium) or heart valves. It is caused by bacteria or a fungus.  Having certain heart conditions or procedures may increase the risk of endocarditis.  Antibiotics are an important treatment for endocarditis. Take these medicines as told by your health care provider. Do not stop taking them even if you start to feel better.  Tell all of your health care providers, including your dentist, that you have had endocarditis. This information is not intended to replace advice given to you by your health care provider. Make sure you discuss any questions you have with your health care provider. Document Released: 03/08/2005 Document Revised: 12/19/2015 Document Reviewed: 12/19/2015 Elsevier Interactive Patient Education  2017 [employer].

## 2016-03-01 ENCOUNTER — Telehealth: Payer: Self-pay | Admitting: Family Medicine

## 2016-03-01 ENCOUNTER — Other Ambulatory Visit (INDEPENDENT_AMBULATORY_CARE_PROVIDER_SITE_OTHER): Payer: Medicare Other

## 2016-03-01 DIAGNOSIS — R011 Cardiac murmur, unspecified: Secondary | ICD-10-CM | POA: Diagnosis not present

## 2016-03-01 DIAGNOSIS — Z8679 Personal history of other diseases of the circulatory system: Secondary | ICD-10-CM

## 2016-03-01 LAB — HEPATIC FUNCTION PANEL
ALBUMIN: 4.3 g/dL (ref 3.5–5.2)
ALK PHOS: 75 U/L (ref 39–117)
ALT: 15 U/L (ref 0–35)
AST: 18 U/L (ref 0–37)
BILIRUBIN DIRECT: 0.1 mg/dL (ref 0.0–0.3)
TOTAL PROTEIN: 7.4 g/dL (ref 6.0–8.3)
Total Bilirubin: 0.8 mg/dL (ref 0.2–1.2)

## 2016-03-01 LAB — BASIC METABOLIC PANEL
BUN: 19 mg/dL (ref 6–23)
CALCIUM: 9.7 mg/dL (ref 8.4–10.5)
CHLORIDE: 100 meq/L (ref 96–112)
CO2: 31 meq/L (ref 19–32)
CREATININE: 0.72 mg/dL (ref 0.40–1.20)
GFR: 108.24 mL/min (ref >60.00)
GLUCOSE: 98 mg/dL (ref 70–99)
Potassium: 4.3 mEq/L (ref 3.5–5.1)
SODIUM: 140 meq/L (ref 135–145)

## 2016-03-01 LAB — CBC WITH DIFFERENTIAL/PLATELET
BASOS ABS: 0 10*3/uL (ref 0.0–0.1)
BASOS PCT: 0.2 % (ref 0.0–3.0)
EOS PCT: 2.1 % (ref 0.0–5.0)
Eosinophils Absolute: 0.1 10*3/uL (ref 0.0–0.7)
HEMATOCRIT: 37 % (ref 36.0–46.0)
Hemoglobin: 12.1 g/dL (ref 12.0–15.0)
LYMPHS PCT: 28.3 % (ref 12.0–46.0)
Lymphs Abs: 1.8 10*3/uL (ref 0.7–4.0)
MCHC: 32.6 g/dL (ref 30.0–36.0)
MCV: 93.6 fl (ref 78.0–100.0)
MONOS PCT: 7.8 % (ref 3.0–12.0)
Monocytes Absolute: 0.5 10*3/uL (ref 0.1–1.0)
NEUTROS ABS: 4 10*3/uL (ref 1.4–7.7)
Neutrophils Relative %: 61.6 % (ref 43.0–77.0)
PLATELETS: 226 10*3/uL (ref 150.0–400.0)
RBC: 3.96 Mil/uL (ref 3.87–5.11)
RDW: 14.4 % (ref 11.5–15.5)
WBC: 6.5 10*3/uL (ref 4.0–10.5)

## 2016-03-01 MED FILL — oxyCODONE HCL 10 MG TABS: 10 | 22 days supply | Qty: 90 | Fill #0

## 2016-03-01 NOTE — Telephone Encounter (Signed)
Pt is aware of normal results.

## 2016-03-01 NOTE — Telephone Encounter (Signed)
° ° °  Pt would like a call back concerning some lab work she had at Urgent care over the week end .

## 2016-03-08 ENCOUNTER — Other Ambulatory Visit: Payer: Medicare Other

## 2016-03-08 ENCOUNTER — Ambulatory Visit: Payer: Medicare Other

## 2016-03-08 ENCOUNTER — Ambulatory Visit: Payer: Medicare Other | Admitting: Internal Medicine

## 2016-03-16 ENCOUNTER — Telehealth: Payer: Self-pay | Admitting: *Deleted

## 2016-03-16 ENCOUNTER — Other Ambulatory Visit: Payer: Self-pay | Admitting: *Deleted

## 2016-03-16 ENCOUNTER — Other Ambulatory Visit: Payer: Self-pay | Admitting: Emergency Medicine

## 2016-03-16 DIAGNOSIS — R197 Diarrhea, unspecified: Secondary | ICD-10-CM

## 2016-03-16 DIAGNOSIS — C3491 Malignant neoplasm of unspecified part of right bronchus or lung: Secondary | ICD-10-CM

## 2016-03-16 MED ORDER — DIPHENOXYLATE-ATROPINE 2.5-0.025 MG PO TABS: 2.0000 | ORAL_TABLET | Freq: Four times a day (QID) | ORAL | 0 refills | Status: DC | PRN

## 2016-03-16 NOTE — Telephone Encounter (Signed)
Lomotil called in to [facility], [address]. Instructions are to alternate with imodium, patient verbalizes understanding and will keep her regularly scheduled appt tomorrow with dr Julien Nordmann.

## 2016-03-16 NOTE — Telephone Encounter (Signed)
No note

## 2016-03-17 ENCOUNTER — Ambulatory Visit (HOSPITAL_BASED_OUTPATIENT_CLINIC_OR_DEPARTMENT_OTHER): Payer: Medicare Other | Admitting: Internal Medicine

## 2016-03-17 ENCOUNTER — Other Ambulatory Visit (HOSPITAL_BASED_OUTPATIENT_CLINIC_OR_DEPARTMENT_OTHER): Payer: Medicare Other

## 2016-03-17 ENCOUNTER — Encounter: Payer: Self-pay | Admitting: Internal Medicine

## 2016-03-17 ENCOUNTER — Ambulatory Visit: Payer: Medicare Other

## 2016-03-17 VITALS — BP 123/84 | HR 93 | Temp 97.8°F | Resp 18 | Ht 67.0 in | Wt 277.3 lb

## 2016-03-17 DIAGNOSIS — C7951 Secondary malignant neoplasm of bone: Secondary | ICD-10-CM | POA: Diagnosis not present

## 2016-03-17 DIAGNOSIS — C3491 Malignant neoplasm of unspecified part of right bronchus or lung: Secondary | ICD-10-CM | POA: Diagnosis not present

## 2016-03-17 DIAGNOSIS — Z5111 Encounter for antineoplastic chemotherapy: Secondary | ICD-10-CM

## 2016-03-17 DIAGNOSIS — R062 Wheezing: Secondary | ICD-10-CM | POA: Diagnosis not present

## 2016-03-17 DIAGNOSIS — J069 Acute upper respiratory infection, unspecified: Secondary | ICD-10-CM

## 2016-03-17 DIAGNOSIS — R197 Diarrhea, unspecified: Secondary | ICD-10-CM | POA: Diagnosis not present

## 2016-03-17 DIAGNOSIS — D649 Anemia, unspecified: Secondary | ICD-10-CM

## 2016-03-17 LAB — COMPREHENSIVE METABOLIC PANEL
ALBUMIN: 3.6 g/dL (ref 3.5–5.0)
ALK PHOS: 92 U/L (ref 40–150)
ALT: 17 U/L (ref 0–55)
AST: 26 U/L (ref 5–34)
Anion Gap: 9 mEq/L (ref 3–11)
BILIRUBIN TOTAL: 0.75 mg/dL (ref 0.20–1.20)
BUN: 11.7 mg/dL (ref 7.0–26.0)
CALCIUM: 8.7 mg/dL (ref 8.4–10.4)
CO2: 29 mEq/L (ref 22–29)
CREATININE: 0.8 mg/dL (ref 0.6–1.1)
Chloride: 105 mEq/L (ref 98–109)
EGFR: >90 mL/min/{1.73_m2} (ref >90)
Glucose: 94 mg/dl (ref 70–140)
POTASSIUM: 3.6 meq/L (ref 3.5–5.1)
Sodium: 144 mEq/L (ref 136–145)
TOTAL PROTEIN: 7.6 g/dL (ref 6.4–8.3)

## 2016-03-17 LAB — CBC WITH DIFFERENTIAL/PLATELET
BASO%: 1 % (ref 0.0–2.0)
BASOS ABS: 0.1 10*3/uL (ref 0.0–0.1)
EOS%: 4.3 % (ref 0.0–7.0)
Eosinophils Absolute: 0.3 10*3/uL (ref 0.0–0.5)
HEMATOCRIT: 34.4 % — AB (ref 34.8–46.6)
HEMOGLOBIN: 11.3 g/dL — AB (ref 11.6–15.9)
LYMPH#: 1.7 10*3/uL (ref 0.9–3.3)
LYMPH%: 28.4 % (ref 14.0–49.7)
MCH: 31.5 pg (ref 25.1–34.0)
MCHC: 33 g/dL (ref 31.5–36.0)
MCV: 95.6 fL (ref 79.5–101.0)
MONO#: 0.5 10*3/uL (ref 0.1–0.9)
MONO%: 7.5 % (ref 0.0–14.0)
NEUT%: 58.8 % (ref 38.4–76.8)
NEUTROS ABS: 3.6 10*3/uL (ref 1.5–6.5)
Platelets: 230 10*3/uL (ref 145–400)
RBC: 3.6 10*6/uL — ABNORMAL LOW (ref 3.70–5.45)
RDW: 14.4 % (ref 11.2–14.5)
WBC: 6.1 10*3/uL (ref 3.9–10.3)

## 2016-03-17 MED ORDER — METHYLPREDNISOLONE 4 MG PO TBPK: ORAL_TABLET | ORAL | 0 refills | Status: DC

## 2016-03-17 MED FILL — METHYLPREDNISOLONE 4 MG TAB: 4 | 6 days supply | Qty: 21 | Fill #0

## 2016-03-17 NOTE — Progress Notes (Signed)
[place] [phone]   [phone]  OFFICE PROGRESS NOTE  [name], MD [place] [address]  DIAGNOSIS: Metastatic non-small cell lung cancer, adenocarcinoma with positive EGFR mutation in exon 19 diagnosed in June 2013  PRIOR THERAPY: Palliative radiotherapy to the right lung under the care of Dr. Valere Dross completed on 10/29/2011.  CURRENT THERAPY: 1) [name] 150 mg by mouth daily started 10/11/2011, status post 52 months of treatment. 2) Xgeva 120 mg subcutaneously every 8 weeks.  INTERVAL HISTORY: Beth [name] 55 y.o. female returns to the clinic today for follow-up visit. The patient has been complaining of flulike symptoms and upper respiratory infection. She was seen by her primary care physician and started on treatment with amoxicillin. This was complicated with several episodes of diarrhea up to 10 times a day. She is currently on Imodium and Lomotil. She is feeling a little bit better. She denied having any current fever or chills. She has no nausea or vomiting. She continues to have chest wheezing. She denied having any chest pain but has shortness of breath with exertion with mild cough and no hemoptysis. She denied having any significant weight loss or night sweats. She is here today for evaluation and repeat blood work.  MEDICAL HISTORY: Past Medical History:  Diagnosis Date  . Anxiety   . Arthritis of knee, degenerative   . Asthma   . Complication of anesthesia    difficulty waking up  . Depression   . Endocarditis    as teenager  . Fibromyalgia   . GERD (gastroesophageal reflux disease)   . Heart murmur   . History of radiation therapy 09/29/11-11/04/2011   right lung 2700cGy 15 sessions  . Hypertension   . Lung mass    R- ADENOCARCINOMA  . Osseous metastasis ([place]) 09/20/11   per PET scan  . Pleural effusion 08/30/11  . Shortness of breath   . Sleep apnea    no longer using CPAP     ALLERGIES:  is allergic to meloxicam.  MEDICATIONS:  Current Outpatient Prescriptions  Medication Sig Dispense Refill  . albuterol-ipratropium (COMBIVENT) 18-103 MCG/ACT inhaler Inhale 1 puff into the lungs every 6 (six) hours as needed for wheezing or shortness of breath.    Marland Kitchen amLODipine (NORVASC) 5 MG tablet TAKE 1 TABLET BY MOUTH EVERY DAY 90 tablet 1  . COMBIVENT RESPIMAT 20-100 MCG/ACT AERS respimat INHALE 1 PUFF INTO THE LUNGS EVERY 6 HOURS AS NEEDED FOR WHEEZING 4 g 3  . diphenhydrAMINE (BENADRYL) 25 MG tablet Take 25 mg by mouth every 6 (six) hours as needed.    . diphenoxylate-atropine (LOMOTIL) 2.5-0.025 MG tablet Take 2 tablets by mouth 4 (four) times daily as needed for diarrhea or loose stools. Alternate with imodium after each stool up to 8/day. 30 tablet 0  . ergocalciferol (VITAMIN D2) 50000 units capsule Take 1 capsule (50,000 Units total) by mouth once a week. 12 capsule 3  . erlotinib ([name]) 150 MG tablet Take 1 tablet (150 mg total) by mouth daily. Take on an empty stomach 1 hour before meals or 2 hours after. 30 tablet 1  . famotidine (PEPCID) 20 MG tablet One after bfast and after supper 60 tablet 2  . fish oil-omega-3 fatty acids 1000 MG capsule Take 2 g by mouth daily.    . furosemide (LASIX) 20 MG tablet Take one daily as needed for severe edema. 30 tablet 1  . hydrochlorothiazide (HYDRODIURIL) 25 MG tablet  TAKE 1 TABLET BY MOUTH EVERY DAY 90 tablet 2  . ibuprofen (ADVIL,MOTRIN) 800 MG tablet Take 1 tablet (800 mg total) by mouth daily as needed. 30 tablet 3  . loperamide (IMODIUM) 2 MG capsule Take 2 mg by mouth 4 (four) times daily as needed for diarrhea or loose stools.    . Multiple Vitamins-Minerals (MULTIVITAMIN WITH MINERALS) tablet Take 1 tablet by mouth daily.    . mupirocin ointment (BACTROBAN) 2 % APPLY INTO EACH NOSTRIL TWICE DAILY 22 g 0  . NONFORMULARY OR COMPOUNDED ITEM Cbcd, cmp,ua   Dx hx fever, hx endocarditis, 1 each 0  . Oxycodone HCl 10 MG  TABS Take 1 tablet (10 mg total) by mouth every 6 (six) hours as needed. 90 tablet 0  . PREMARIN vaginal cream Place 1 application vaginally 2 (two) times daily.  5  . prochlorperazine (COMPAZINE) 10 MG tablet TAKE ONE TABLET BY MOUTH EVERY 6 HOURS AS NEEDED 30 tablet 1  . temazepam (RESTORIL) 15 MG capsule Take 1 capsule (15 mg total) by mouth at bedtime as needed for sleep. 30 capsule 0  . venlafaxine XR (EFFEXOR-XR) 75 MG 24 hr capsule      No current facility-administered medications for this visit.     SURGICAL HISTORY:  Past Surgical History:  Procedure Laterality Date  . THORACENTESIS  09/02/11, 09/09/11   right-side pleural effusion  . TUBAL LIGATION  2002    REVIEW OF SYSTEMS:  A comprehensive review of systems was negative except for: Constitutional: positive for fatigue Respiratory: positive for cough, dyspnea on exertion and wheezing Gastrointestinal: positive for diarrhea   PHYSICAL EXAMINATION: General appearance: alert, cooperative, fatigued and no distress Head: Normocephalic, without obvious abnormality, atraumatic Neck: no adenopathy, no JVD, supple, symmetrical, trachea midline and thyroid not enlarged, symmetric, no tenderness/mass/nodules Lymph nodes: Cervical, supraclavicular, and axillary nodes normal. Resp: clear to auscultation bilaterally Back: symmetric, no curvature. ROM normal. No CVA tenderness. Cardio: regular rate and rhythm, S1, S2 normal, no murmur, click, rub or gallop GI: soft, non-tender; bowel sounds normal; no masses,  no organomegaly Extremities: extremities normal, atraumatic, no cyanosis or edema  ECOG PERFORMANCE STATUS: 1 - Symptomatic but completely ambulatory  Blood pressure 123/84, pulse 93, temperature 97.8 F (36.6 C), temperature source Oral, resp. rate 18, height _0  (1.702 m), weight 277 lb 4.8 oz (125.8 kg), last menstrual period 09/28/2011, SpO2 97 %.  LABORATORY DATA: Lab Results  Component Value Date   WBC 6.1 03/17/2016    HGB 11.3 (L) 03/17/2016   HCT 34.4 (L) 03/17/2016   MCV 95.6 03/17/2016   PLT 230 03/17/2016      Chemistry      Component Value Date/Time   NA 140 03/01/2016 1110   NA 142 01/20/2016 1454   K 4.3 03/01/2016 1110   K 3.8 01/20/2016 1454   CL 100 03/01/2016 1110   CL 104 08/18/2012 1154   CO2 31 03/01/2016 1110   CO2 28 01/20/2016 1454   BUN 19 03/01/2016 1110   BUN 18.1 01/20/2016 1454   CREATININE 0.72 03/01/2016 1110   CREATININE 0.8 01/20/2016 1454      Component Value Date/Time   CALCIUM 9.7 03/01/2016 1110   CALCIUM 9.2 01/20/2016 1454   ALKPHOS 75 03/01/2016 1110   ALKPHOS 79 01/20/2016 1454   AST 18 03/01/2016 1110   AST 19 01/20/2016 1454   ALT 15 03/01/2016 1110   ALT 13 01/20/2016 1454   BILITOT 0.8 03/01/2016 1110   BILITOT 1.11  01/20/2016 1454       RADIOGRAPHIC STUDIES: No results found.  ASSESSMENT AND PLAN: This is a very pleasant 55 years old African-American female with stage IV non-small cell lung cancer diagnosed in June 2013 and has been on treatment with [name] 150 mg by mouth daily since that time. She is tolerating her treatment well except for few episodes of diarrhea recently secondary to treatment with amoxicillin. I recommended for the patient to continue her current treatment with [name]. For the diarrhea, she will continue on Imodium and Lomotil and also advised the patient to take probiotics. For the wheezing, I will give the patient Medrol Dosepak today. She will continue her treatment with Xgeva every 2 months for the metastatic bone disease. The patient would come back for follow-up visit in 2 months for evaluation after repeating CT scan of the chest, abdomen and pelvis for restaging of her disease. She was advised to call immediately if she has any concerning symptoms in the interval. The patient voices understanding of current disease status and treatment options and is in agreement with the current care plan.  All questions  were answered. The patient knows to call the clinic with any problems, questions or concerns. We can certainly see the patient much sooner if necessary.  I spent 10 minutes counseling the patient face to face. The total time spent in the appointment was 15 minutes.  Disclaimer: This note was dictated with voice recognition software. Similar sounding words can inadvertently be transcribed and may not be corrected upon review.

## 2016-03-19 ENCOUNTER — Other Ambulatory Visit: Payer: Self-pay | Admitting: Medical Oncology

## 2016-03-19 ENCOUNTER — Encounter: Payer: Medicare Other | Admitting: Nurse Practitioner

## 2016-03-19 ENCOUNTER — Telehealth: Payer: Self-pay | Admitting: Medical Oncology

## 2016-03-19 DIAGNOSIS — C3491 Malignant neoplasm of unspecified part of right bronchus or lung: Secondary | ICD-10-CM

## 2016-03-19 NOTE — Telephone Encounter (Signed)
"  I 'm thinking I will just wait- I tend to overreact. I went and ran some errands and will stay inside and call back next week if I am no better". I cancelled [facility] and CXR

## 2016-03-19 NOTE — Telephone Encounter (Addendum)
Has 4 days left amoxicillin - not better , more wheezing and np cough. Denies fever.  appt with [facility] and cxr if auth by [facility]. I told pt I will call her back.

## 2016-03-19 NOTE — Telephone Encounter (Addendum)
Per Julien Nordmann -take steroids , antibioitics , f/u with PCP -"I'm not happy with this plan" -ok for - [facility]

## 2016-03-23 ENCOUNTER — Ambulatory Visit: Payer: Medicare Other | Admitting: Family Medicine

## 2016-03-24 ENCOUNTER — Telehealth: Payer: Self-pay | Admitting: *Deleted

## 2016-03-24 ENCOUNTER — Other Ambulatory Visit: Payer: Self-pay | Admitting: Medical Oncology

## 2016-03-24 DIAGNOSIS — C3490 Malignant neoplasm of unspecified part of unspecified bronchus or lung: Secondary | ICD-10-CM

## 2016-03-24 DIAGNOSIS — Z5111 Encounter for antineoplastic chemotherapy: Secondary | ICD-10-CM

## 2016-03-24 MED ORDER — OXYCODONE HCL 10 MG PO TABS: 10.0000 mg | ORAL_TABLET | Freq: Four times a day (QID) | ORAL | 0 refills | Status: DC | PRN

## 2016-03-24 MED FILL — oxyCODONE HCL 10 MG TABS: 10 | 22 days supply | Qty: 90 | Fill #0

## 2016-03-24 NOTE — Telephone Encounter (Signed)
Patient called requesting a refill for her oxycodone.

## 2016-04-01 ENCOUNTER — Other Ambulatory Visit: Payer: Self-pay | Admitting: Family Medicine

## 2016-04-02 ENCOUNTER — Ambulatory Visit (HOSPITAL_BASED_OUTPATIENT_CLINIC_OR_DEPARTMENT_OTHER): Payer: Medicare Other

## 2016-04-02 ENCOUNTER — Telehealth: Payer: Self-pay | Admitting: Internal Medicine

## 2016-04-02 VITALS — BP 134/77 | HR 100 | Temp 98.9°F | Resp 20

## 2016-04-02 DIAGNOSIS — C3491 Malignant neoplasm of unspecified part of right bronchus or lung: Secondary | ICD-10-CM

## 2016-04-02 DIAGNOSIS — J45991 Cough variant asthma: Secondary | ICD-10-CM

## 2016-04-02 DIAGNOSIS — C7951 Secondary malignant neoplasm of bone: Secondary | ICD-10-CM

## 2016-04-02 MED ORDER — DENOSUMAB 120 MG/1.7ML ~~LOC~~ SOLN
120.0000 mg | Freq: Once | SUBCUTANEOUS | Status: AC
  Administered 2016-04-02: 120 mg via SUBCUTANEOUS
  Filled 2016-04-02: qty 1.7

## 2016-04-02 NOTE — Telephone Encounter (Signed)
Pt called to r/s missed  inj appt for today. Pt could be her in 15 mins. Gave pt appt time for 11:45 am

## 2016-04-02 NOTE — Telephone Encounter (Signed)
lmtcb X1 for pt  

## 2016-04-02 NOTE — Patient Instructions (Signed)
Denosumab injection  What is this medicine?  DENOSUMAB (den oh sue mab) slows bone breakdown. Prolia is used to treat osteoporosis in women after menopause and in men. Xgeva is used to prevent bone fractures and other bone problems caused by cancer bone metastases. Xgeva is also used to treat giant cell tumor of the bone.  This medicine may be used for other purposes; ask your health care provider or pharmacist if you have questions.  What should I tell my health care provider before I take this medicine?  They need to know if you have any of these conditions:  -dental disease  -eczema  -infection or history of infections  -kidney disease or on dialysis  -low blood calcium or vitamin D  -malabsorption syndrome  -scheduled to have surgery or tooth extraction  -taking medicine that contains denosumab  -thyroid or parathyroid disease  -an unusual reaction to denosumab, other medicines, foods, dyes, or preservatives  -pregnant or trying to get pregnant  -breast-feeding  How should I use this medicine?  This medicine is for injection under the skin. It is given by a health care professional in a hospital or clinic setting.  If you are getting Prolia, a special MedGuide will be given to you by the pharmacist with each prescription and refill. Be sure to read this information carefully each time.  For Prolia, talk to your pediatrician regarding the use of this medicine in children. Special care may be needed. For Xgeva, talk to your pediatrician regarding the use of this medicine in children. While this drug may be prescribed for children as young as 13 years for selected conditions, precautions do apply.  Overdosage: If you think you have taken too much of this medicine contact a poison control center or emergency room at once.  NOTE: This medicine is only for you. Do not share this medicine with others.  What if I miss a dose?  It is important not to miss your dose. Call your doctor or health care professional if you are  unable to keep an appointment.  What may interact with this medicine?  Do not take this medicine with any of the following medications:  -other medicines containing denosumab  This medicine may also interact with the following medications:  -medicines that suppress the immune system  -medicines that treat cancer  -steroid medicines like prednisone or cortisone  This list may not describe all possible interactions. Give your health care provider a list of all the medicines, herbs, non-prescription drugs, or dietary supplements you use. Also tell them if you smoke, drink alcohol, or use illegal drugs. Some items may interact with your medicine.  What should I watch for while using this medicine?  Visit your doctor or health care professional for regular checks on your progress. Your doctor or health care professional may order blood tests and other tests to see how you are doing.  Call your doctor or health care professional if you get a cold or other infection while receiving this medicine. Do not treat yourself. This medicine may decrease your body's ability to fight infection.  You should make sure you get enough calcium and vitamin D while you are taking this medicine, unless your doctor tells you not to. Discuss the foods you eat and the vitamins you take with your health care professional.  See your dentist regularly. Brush and floss your teeth as directed. Before you have any dental work done, tell your dentist you are receiving this medicine.  Do   not become pregnant while taking this medicine or for 5 months after stopping it. Women should inform their doctor if they wish to become pregnant or think they might be pregnant. There is a potential for serious side effects to an unborn child. Talk to your health care professional or pharmacist for more information.  What side effects may I notice from receiving this medicine?  Side effects that you should report to your doctor or health care professional as soon as  possible:  -allergic reactions like skin rash, itching or hives, swelling of the face, lips, or tongue  -breathing problems  -chest pain  -fast, irregular heartbeat  -feeling faint or lightheaded, falls  -fever, chills, or any other sign of infection  -muscle spasms, tightening, or twitches  -numbness or tingling  -skin blisters or bumps, or is dry, peels, or red  -slow healing or unexplained pain in the mouth or jaw  -unusual bleeding or bruising  Side effects that usually do not require medical attention (Report these to your doctor or health care professional if they continue or are bothersome.):  -muscle pain  -stomach upset, gas  This list may not describe all possible side effects. Call your doctor for medical advice about side effects. You may report side effects to FDA at 1-800-FDA-1088.  Where should I keep my medicine?  This medicine is only given in a clinic, doctor's office, or other health care setting and will not be stored at home.  NOTE: This sheet is a summary. It may not cover all possible information. If you have questions about this medicine, talk to your doctor, pharmacist, or health care provider.      2016, Elsevier/Gold Standard. (2011-09-06 12:37:47)

## 2016-04-05 NOTE — Telephone Encounter (Signed)
Done  Order sent to [facility]

## 2016-04-05 NOTE — Telephone Encounter (Signed)
Pt is scheduled for a CT Chest on 05/11/16 at [facility] and states that she was advised to let Dr Melvyn Novas know so that our office could add a CT Sinus to be done at the same time.  Tanda Rockers, MD   Note  Ok for augmentin x 10 days then CT sinus at end of rx  I can't order Dr Lew Dawes CT but once we have a date for ours it's fine with me if she call's Dr Lew Dawes office and has their ct changed to same date as ours      Please advise Dr Melvyn Novas. Thanks.

## 2016-04-05 NOTE — Telephone Encounter (Signed)
Fine to order CT limited sinus same day dx cough variant asthma

## 2016-04-14 ENCOUNTER — Telehealth: Payer: Self-pay | Admitting: *Deleted

## 2016-04-14 DIAGNOSIS — C3490 Malignant neoplasm of unspecified part of unspecified bronchus or lung: Secondary | ICD-10-CM

## 2016-04-14 MED ORDER — ERLOTINIB HCL 150 MG PO TABS: 150.0000 mg | ORAL_TABLET | Freq: Every day | ORAL | 1 refills | Status: DC

## 2016-04-14 NOTE — Telephone Encounter (Signed)
"  This is [place] calling to verify your fax number to send refill request for [name]." Provided [phone].  Proceeded with phone refill with this call.   Called patient at 9:12 am to inquire about Famotidine use.  Prescribed by Dr. Melvyn Novas 12-09-2015.

## 2016-04-16 ENCOUNTER — Other Ambulatory Visit: Payer: Self-pay | Admitting: *Deleted

## 2016-04-16 DIAGNOSIS — C3490 Malignant neoplasm of unspecified part of unspecified bronchus or lung: Secondary | ICD-10-CM

## 2016-04-16 NOTE — Telephone Encounter (Signed)
error 

## 2016-04-19 ENCOUNTER — Other Ambulatory Visit: Payer: Self-pay | Admitting: Medical Oncology

## 2016-04-19 ENCOUNTER — Other Ambulatory Visit (HOSPITAL_COMMUNITY): Payer: Medicare Other

## 2016-04-19 ENCOUNTER — Telehealth: Payer: Self-pay | Admitting: Medical Oncology

## 2016-04-19 DIAGNOSIS — C3490 Malignant neoplasm of unspecified part of unspecified bronchus or lung: Secondary | ICD-10-CM

## 2016-04-19 DIAGNOSIS — Z5111 Encounter for antineoplastic chemotherapy: Secondary | ICD-10-CM

## 2016-04-19 MED ORDER — OXYCODONE HCL 10 MG PO TABS: 10.0000 mg | ORAL_TABLET | Freq: Four times a day (QID) | ORAL | 0 refills | Status: DC | PRN

## 2016-04-19 NOTE — Telephone Encounter (Signed)
Left message that rx is ready.

## 2016-04-20 MED FILL — oxyCODONE HCL 10 MG TABS: 10 | 22 days supply | Qty: 90 | Fill #0

## 2016-04-21 ENCOUNTER — Telehealth: Payer: Self-pay | Admitting: Internal Medicine

## 2016-04-21 MED ORDER — AMOXICILLIN-POT CLAVULANATE 875-125 MG PO TABS: 1.0000 | ORAL_TABLET | Freq: Two times a day (BID) | ORAL | 0 refills | Status: DC

## 2016-04-21 NOTE — Telephone Encounter (Signed)
Spoke with pt, who c/o prod cough with white mucus, wheezing worsen at night & chest congestion X 2w Denies any fever, chills or sweats.  Pt not currently taken any OTC medications to help with symptoms. Pt is requesting a Rx for Augmentin to be sent in. MW please advise. Thanks.   Patient Instructions     If cough recurs in near future call for another round of augmentin and to schedule sinus ct then  If you are satisfied with your treatment plan,  let your doctor know and he/she can either refill your medications or you can return here when your prescription runs out.     If in any way you are not 100% satisfied,  please tell us.  If 100% better, tell your friends!  Pulmonary follow up is as needed

## 2016-04-21 NOTE — Telephone Encounter (Signed)
rx  another round of augmentin and to schedule sinus ct 10 days later  Augmentin 875 mg take one pill twice daily  X 10 days - take at breakfast and supper with large glass of water.  It would help reduce the usual side effects (diarrhea and yeast infections) if you ate cultured yogurt at lunch.

## 2016-04-21 NOTE — Telephone Encounter (Signed)
Pt aware of MW's recommendations and voiced her understanding. RX has been sent to preferred pharmacy. Pt is scheduled for CT on 05-11-16. Nothing further needed.

## 2016-04-27 ENCOUNTER — Ambulatory Visit (HOSPITAL_COMMUNITY): Payer: Medicare Other | Attending: Cardiology

## 2016-04-27 ENCOUNTER — Other Ambulatory Visit: Payer: Self-pay

## 2016-04-27 DIAGNOSIS — I10 Essential (primary) hypertension: Secondary | ICD-10-CM | POA: Diagnosis not present

## 2016-04-27 DIAGNOSIS — Z8679 Personal history of other diseases of the circulatory system: Secondary | ICD-10-CM

## 2016-04-27 DIAGNOSIS — Z6841 Body Mass Index (BMI) 40.0 and over, adult: Secondary | ICD-10-CM | POA: Diagnosis not present

## 2016-04-27 DIAGNOSIS — R011 Cardiac murmur, unspecified: Secondary | ICD-10-CM

## 2016-04-27 DIAGNOSIS — Z87891 Personal history of nicotine dependence: Secondary | ICD-10-CM | POA: Diagnosis not present

## 2016-04-27 DIAGNOSIS — I348 Other nonrheumatic mitral valve disorders: Secondary | ICD-10-CM | POA: Insufficient documentation

## 2016-04-27 DIAGNOSIS — I358 Other nonrheumatic aortic valve disorders: Secondary | ICD-10-CM | POA: Insufficient documentation

## 2016-04-28 ENCOUNTER — Encounter: Payer: Self-pay | Admitting: Family Medicine

## 2016-05-01 ENCOUNTER — Other Ambulatory Visit: Payer: Self-pay | Admitting: Family Medicine

## 2016-05-04 ENCOUNTER — Other Ambulatory Visit: Payer: Self-pay | Admitting: Medical Oncology

## 2016-05-04 DIAGNOSIS — C3410 Malignant neoplasm of upper lobe, unspecified bronchus or lung: Secondary | ICD-10-CM

## 2016-05-04 DIAGNOSIS — C3491 Malignant neoplasm of unspecified part of right bronchus or lung: Secondary | ICD-10-CM

## 2016-05-04 MED ORDER — TEMAZEPAM 15 MG PO CAPS
15.0000 mg | ORAL_CAPSULE | Freq: Every evening | ORAL | 0 refills | Status: DC | PRN
Start: 2016-05-04 — End: 2016-10-05

## 2016-05-05 ENCOUNTER — Other Ambulatory Visit: Payer: Medicare Other

## 2016-05-11 ENCOUNTER — Ambulatory Visit (HOSPITAL_COMMUNITY)
Admission: RE | Admit: 2016-05-11 | Discharge: 2016-05-11 | Disposition: A | Discharge status: Home / Self Care | Payer: Medicare Other | Source: Ambulatory Visit | Attending: Internal Medicine | Admitting: Internal Medicine

## 2016-05-11 ENCOUNTER — Other Ambulatory Visit (HOSPITAL_BASED_OUTPATIENT_CLINIC_OR_DEPARTMENT_OTHER): Payer: Medicare Other

## 2016-05-11 DIAGNOSIS — D649 Anemia, unspecified: Secondary | ICD-10-CM | POA: Diagnosis not present

## 2016-05-11 DIAGNOSIS — R05 Cough: Secondary | ICD-10-CM | POA: Diagnosis not present

## 2016-05-11 DIAGNOSIS — D169 Benign neoplasm of bone and articular cartilage, unspecified: Secondary | ICD-10-CM | POA: Insufficient documentation

## 2016-05-11 DIAGNOSIS — J45991 Cough variant asthma: Secondary | ICD-10-CM | POA: Insufficient documentation

## 2016-05-11 DIAGNOSIS — J069 Acute upper respiratory infection, unspecified: Secondary | ICD-10-CM | POA: Diagnosis not present

## 2016-05-11 DIAGNOSIS — C3491 Malignant neoplasm of unspecified part of right bronchus or lung: Secondary | ICD-10-CM | POA: Diagnosis present

## 2016-05-11 DIAGNOSIS — K449 Diaphragmatic hernia without obstruction or gangrene: Secondary | ICD-10-CM | POA: Diagnosis not present

## 2016-05-11 DIAGNOSIS — Z5111 Encounter for antineoplastic chemotherapy: Secondary | ICD-10-CM

## 2016-05-11 LAB — COMPREHENSIVE METABOLIC PANEL
ALBUMIN: 3.7 g/dL (ref 3.5–5.0)
ALT: 16 U/L (ref 0–55)
AST: 18 U/L (ref 5–34)
Alkaline Phosphatase: 94 U/L (ref 40–150)
Anion Gap: 9 mEq/L (ref 3–11)
BUN: 21.8 mg/dL (ref 7.0–26.0)
CHLORIDE: 107 meq/L (ref 98–109)
CO2: 27 mEq/L (ref 22–29)
Calcium: 9.7 mg/dL (ref 8.4–10.4)
Creatinine: 0.8 mg/dL (ref 0.6–1.1)
EGFR: >90 mL/min/{1.73_m2} (ref >90)
GLUCOSE: 105 mg/dL (ref 70–140)
POTASSIUM: 4.3 meq/L (ref 3.5–5.1)
SODIUM: 143 meq/L (ref 136–145)
Total Bilirubin: 0.72 mg/dL (ref 0.20–1.20)
Total Protein: 7.6 g/dL (ref 6.4–8.3)

## 2016-05-11 LAB — CBC WITH DIFFERENTIAL/PLATELET
BASO%: 0.4 % (ref 0.0–2.0)
BASOS ABS: 0 10*3/uL (ref 0.0–0.1)
EOS%: 3.5 % (ref 0.0–7.0)
Eosinophils Absolute: 0.2 10*3/uL (ref 0.0–0.5)
HCT: 35.5 % (ref 34.8–46.6)
HEMOGLOBIN: 11.8 g/dL (ref 11.6–15.9)
LYMPH%: 34.6 % (ref 14.0–49.7)
MCH: 31.5 pg (ref 25.1–34.0)
MCHC: 33.1 g/dL (ref 31.5–36.0)
MCV: 95 fL (ref 79.5–101.0)
MONO#: 0.4 10*3/uL (ref 0.1–0.9)
MONO%: 8.3 % (ref 0.0–14.0)
NEUT#: 2.9 10*3/uL (ref 1.5–6.5)
NEUT%: 53.2 % (ref 38.4–76.8)
Platelets: 235 10*3/uL (ref 145–400)
RBC: 3.74 10*6/uL (ref 3.70–5.45)
RDW: 14.6 % — AB (ref 11.2–14.5)
WBC: 5.4 10*3/uL (ref 3.9–10.3)
lymph#: 1.9 10*3/uL (ref 0.9–3.3)

## 2016-05-11 MED ORDER — IOPAMIDOL (ISOVUE-300) INJECTION 61%
15.0000 mL | Freq: Once | INTRAVENOUS | Status: DC | PRN
Start: 2016-05-11 — End: 2016-05-12
  Administered 2016-05-11: 15 mL via ORAL
  Filled 2016-05-11: qty 30

## 2016-05-11 MED ORDER — SODIUM CHLORIDE 0.9 % IJ SOLN
INTRAMUSCULAR | Status: AC
  Filled 2016-05-11: qty 50

## 2016-05-11 MED ORDER — IOPAMIDOL (ISOVUE-300) INJECTION 61%
INTRAVENOUS | Status: AC
  Filled 2016-05-11: qty 100

## 2016-05-11 MED ORDER — IOPAMIDOL (ISOVUE-300) INJECTION 61%
INTRAVENOUS | Status: AC
  Administered 2016-05-11: 100 mL
  Filled 2016-05-11: qty 30

## 2016-05-12 ENCOUNTER — Other Ambulatory Visit (HOSPITAL_COMMUNITY)
Admission: RE | Admit: 2016-05-12 | Discharge: 2016-05-12 | Disposition: A | Discharge status: Home / Self Care | Payer: Medicare Other | Source: Ambulatory Visit | Attending: Internal Medicine | Admitting: Internal Medicine

## 2016-05-12 ENCOUNTER — Ambulatory Visit: Payer: Medicare Other

## 2016-05-12 ENCOUNTER — Other Ambulatory Visit: Payer: Self-pay | Admitting: Medical Oncology

## 2016-05-12 ENCOUNTER — Encounter: Payer: Self-pay | Admitting: Internal Medicine

## 2016-05-12 ENCOUNTER — Ambulatory Visit (HOSPITAL_BASED_OUTPATIENT_CLINIC_OR_DEPARTMENT_OTHER): Payer: Medicare Other | Admitting: Internal Medicine

## 2016-05-12 ENCOUNTER — Other Ambulatory Visit: Payer: Medicare Other

## 2016-05-12 VITALS — BP 142/96 | HR 87 | Temp 98.2°F | Resp 18 | Ht 67.0 in | Wt 274.9 lb

## 2016-05-12 DIAGNOSIS — R6889 Other general symptoms and signs: Secondary | ICD-10-CM

## 2016-05-12 DIAGNOSIS — I10 Essential (primary) hypertension: Secondary | ICD-10-CM

## 2016-05-12 DIAGNOSIS — F419 Anxiety disorder, unspecified: Secondary | ICD-10-CM

## 2016-05-12 DIAGNOSIS — C7951 Secondary malignant neoplasm of bone: Secondary | ICD-10-CM | POA: Diagnosis not present

## 2016-05-12 DIAGNOSIS — Z5111 Encounter for antineoplastic chemotherapy: Secondary | ICD-10-CM

## 2016-05-12 DIAGNOSIS — C3411 Malignant neoplasm of upper lobe, right bronchus or lung: Secondary | ICD-10-CM

## 2016-05-12 DIAGNOSIS — M549 Dorsalgia, unspecified: Secondary | ICD-10-CM

## 2016-05-12 DIAGNOSIS — C3491 Malignant neoplasm of unspecified part of right bronchus or lung: Secondary | ICD-10-CM

## 2016-05-12 DIAGNOSIS — C3490 Malignant neoplasm of unspecified part of unspecified bronchus or lung: Secondary | ICD-10-CM

## 2016-05-12 HISTORY — DX: Other general symptoms and signs: R68.89

## 2016-05-12 LAB — INFLUENZA PANEL BY PCR (TYPE A & B)
INFLAPCR: NEGATIVE
Influenza B By PCR: NEGATIVE

## 2016-05-12 MED ORDER — OXYCODONE HCL 10 MG PO TABS: 10.0000 mg | ORAL_TABLET | Freq: Four times a day (QID) | ORAL | 0 refills | Status: DC | PRN

## 2016-05-12 MED FILL — oxyCODONE HCL 5 MG TABS: 5 | 22 days supply | Qty: 180 | Fill #0

## 2016-05-12 NOTE — Patient Instructions (Signed)
Denosumab injection  What is this medicine?  DENOSUMAB (den oh sue mab) slows bone breakdown. Prolia is used to treat osteoporosis in women after menopause and in men. Xgeva is used to prevent bone fractures and other bone problems caused by cancer bone metastases. Xgeva is also used to treat giant cell tumor of the bone.  This medicine may be used for other purposes; ask your health care provider or pharmacist if you have questions.  What should I tell my health care provider before I take this medicine?  They need to know if you have any of these conditions:  -dental disease  -eczema  -infection or history of infections  -kidney disease or on dialysis  -low blood calcium or vitamin D  -malabsorption syndrome  -scheduled to have surgery or tooth extraction  -taking medicine that contains denosumab  -thyroid or parathyroid disease  -an unusual reaction to denosumab, other medicines, foods, dyes, or preservatives  -pregnant or trying to get pregnant  -breast-feeding  How should I use this medicine?  This medicine is for injection under the skin. It is given by a health care professional in a hospital or clinic setting.  If you are getting Prolia, a special MedGuide will be given to you by the pharmacist with each prescription and refill. Be sure to read this information carefully each time.  For Prolia, talk to your pediatrician regarding the use of this medicine in children. Special care may be needed. For Xgeva, talk to your pediatrician regarding the use of this medicine in children. While this drug may be prescribed for children as young as 13 years for selected conditions, precautions do apply.  Overdosage: If you think you have taken too much of this medicine contact a poison control center or emergency room at once.  NOTE: This medicine is only for you. Do not share this medicine with others.  What if I miss a dose?  It is important not to miss your dose. Call your doctor or health care professional if you are  unable to keep an appointment.  What may interact with this medicine?  Do not take this medicine with any of the following medications:  -other medicines containing denosumab  This medicine may also interact with the following medications:  -medicines that suppress the immune system  -medicines that treat cancer  -steroid medicines like prednisone or cortisone  This list may not describe all possible interactions. Give your health care provider a list of all the medicines, herbs, non-prescription drugs, or dietary supplements you use. Also tell them if you smoke, drink alcohol, or use illegal drugs. Some items may interact with your medicine.  What should I watch for while using this medicine?  Visit your doctor or health care professional for regular checks on your progress. Your doctor or health care professional may order blood tests and other tests to see how you are doing.  Call your doctor or health care professional if you get a cold or other infection while receiving this medicine. Do not treat yourself. This medicine may decrease your body's ability to fight infection.  You should make sure you get enough calcium and vitamin D while you are taking this medicine, unless your doctor tells you not to. Discuss the foods you eat and the vitamins you take with your health care professional.  See your dentist regularly. Brush and floss your teeth as directed. Before you have any dental work done, tell your dentist you are receiving this medicine.  Do   not become pregnant while taking this medicine or for 5 months after stopping it. Women should inform their doctor if they wish to become pregnant or think they might be pregnant. There is a potential for serious side effects to an unborn child. Talk to your health care professional or pharmacist for more information.  What side effects may I notice from receiving this medicine?  Side effects that you should report to your doctor or health care professional as soon as  possible:  -allergic reactions like skin rash, itching or hives, swelling of the face, lips, or tongue  -breathing problems  -chest pain  -fast, irregular heartbeat  -feeling faint or lightheaded, falls  -fever, chills, or any other sign of infection  -muscle spasms, tightening, or twitches  -numbness or tingling  -skin blisters or bumps, or is dry, peels, or red  -slow healing or unexplained pain in the mouth or jaw  -unusual bleeding or bruising  Side effects that usually do not require medical attention (Report these to your doctor or health care professional if they continue or are bothersome.):  -muscle pain  -stomach upset, gas  This list may not describe all possible side effects. Call your doctor for medical advice about side effects. You may report side effects to FDA at 1-800-FDA-1088.  Where should I keep my medicine?  This medicine is only given in a clinic, doctor's office, or other health care setting and will not be stored at home.  NOTE: This sheet is a summary. It may not cover all possible information. If you have questions about this medicine, talk to your doctor, pharmacist, or health care provider.      2016, Elsevier/Gold Standard. (2011-09-06 12:37:47)

## 2016-05-12 NOTE — Progress Notes (Signed)
[facility] [phone]   [phone]  OFFICE PROGRESS NOTE  [name], MD [place] [address]  DIAGNOSIS: Metastatic non-small cell lung cancer, adenocarcinoma with positive EGFR mutation in exon 19 diagnosed in June 2013  PRIOR THERAPY: Palliative radiotherapy to the right lung under the care of Dr. Valere Dross completed on 10/29/2011.  CURRENT THERAPY: 1) [name] 150 mg by mouth daily started 10/11/2011, status post 54 months of treatment. 2) Xgeva 120 mg subcutaneously every 8 weeks.  INTERVAL HISTORY: Beth [name] 56 y.o. female to the clinic today for follow-up visit. The patient is complaining of aching pain all over her body as well as nasal congestion. She sings that she may developing flu symptoms. She has no fever or chills. She denied having any chest pain, shortness of breath except with exertion with no cough or hemoptysis. She denied having any nausea, vomiting, diarrhea or constipation. She has no significant weight loss or night sweats. She is currently on treatment with [name] 150 mg by mouth daily and tolerating her treatment well. She had repeat CT scan of the chest, abdomen and pelvis performed recently and she is here for evaluation and discussion of her scan results.  MEDICAL HISTORY: Past Medical History:  Diagnosis Date  . Anxiety   . Arthritis of knee, degenerative   . Asthma   . Complication of anesthesia    difficulty waking up  . Depression   . Endocarditis    as teenager  . Fibromyalgia   . GERD (gastroesophageal reflux disease)   . Heart murmur   . History of radiation therapy 09/29/11-11/04/2011   right lung 2700cGy 15 sessions  . Hypertension   . Lung mass    R- ADENOCARCINOMA  . Osseous metastasis ([place]) 09/20/11   per PET scan  . Pleural effusion 08/30/11  . Shortness of breath   . Sleep apnea    no longer using CPAP    ALLERGIES:  is allergic to  meloxicam.  MEDICATIONS:  Current Outpatient Prescriptions  Medication Sig Dispense Refill  . albuterol-ipratropium (COMBIVENT) 18-103 MCG/ACT inhaler Inhale 1 puff into the lungs every 6 (six) hours as needed for wheezing or shortness of breath.    Marland Kitchen amLODipine (NORVASC) 5 MG tablet TAKE 1 TABLET BY MOUTH EVERY DAY 90 tablet 1  . amoxicillin-clavulanate (AUGMENTIN) 875-125 MG tablet Take 1 tablet by mouth 2 (two) times daily. 20 tablet 0  . COMBIVENT RESPIMAT 20-100 MCG/ACT AERS respimat INHALE 1 PUFF INTO THE LUNGS EVERY 6 HOURS AS NEEDED FOR WHEEZING 4 g 3  . diphenhydrAMINE (BENADRYL) 25 MG tablet Take 25 mg by mouth every 6 (six) hours as needed.    . diphenoxylate-atropine (LOMOTIL) 2.5-0.025 MG tablet Take 2 tablets by mouth 4 (four) times daily as needed for diarrhea or loose stools. Alternate with imodium after each stool up to 8/day. 30 tablet 0  . ergocalciferol (VITAMIN D2) 50000 units capsule Take 1 capsule (50,000 Units total) by mouth once a week. 12 capsule 3  . erlotinib ([name]) 150 MG tablet Take 1 tablet (150 mg total) by mouth daily. Take on an empty stomach 1 hour before meals or 2 hours after. 30 tablet 1  . famotidine (PEPCID) 20 MG tablet One after bfast and after supper 60 tablet 2  . fish oil-omega-3 fatty acids 1000 MG capsule Take 2 g by mouth daily.    . furosemide (LASIX) 20 MG tablet Take one daily as needed for  severe edema. 30 tablet 1  . hydrochlorothiazide (HYDRODIURIL) 25 MG tablet TAKE 1 TABLET BY MOUTH EVERY DAY 90 tablet 2  . ibuprofen (ADVIL,MOTRIN) 800 MG tablet Take 1 tablet (800 mg total) by mouth daily as needed. 30 tablet 3  . loperamide (IMODIUM) 2 MG capsule Take 2 mg by mouth 4 (four) times daily as needed for diarrhea or loose stools.    . methylPREDNISolone (MEDROL DOSEPAK) 4 MG TBPK tablet Use as instructed 21 tablet 0  . Multiple Vitamins-Minerals (MULTIVITAMIN WITH MINERALS) tablet Take 1 tablet by mouth daily.    . mupirocin ointment  (BACTROBAN) 2 % APPLY INTO EACH NOSTRIL TWICE DAILY 22 g 0  . NONFORMULARY OR COMPOUNDED ITEM Cbcd, cmp,ua   Dx hx fever, hx endocarditis, 1 each 0  . Oxycodone HCl 10 MG TABS Take 1 tablet (10 mg total) by mouth every 6 (six) hours as needed. 90 tablet 0  . PREMARIN vaginal cream Place 1 application vaginally 2 (two) times daily.  5  . prochlorperazine (COMPAZINE) 10 MG tablet TAKE ONE TABLET BY MOUTH EVERY 6 HOURS AS NEEDED 30 tablet 1  . temazepam (RESTORIL) 15 MG capsule Take 1 capsule (15 mg total) by mouth at bedtime as needed for sleep. 30 capsule 0  . venlafaxine XR (EFFEXOR-XR) 75 MG 24 hr capsule     . venlafaxine XR (EFFEXOR-XR) 75 MG 24 hr capsule TAKE 1 CAPSULE(75 MG) BY MOUTH 1 TIME 30 capsule 5   No current facility-administered medications for this visit.     SURGICAL HISTORY:  Past Surgical History:  Procedure Laterality Date  . THORACENTESIS  09/02/11, 09/09/11   right-side pleural effusion  . TUBAL LIGATION  2002    REVIEW OF SYSTEMS:  Constitutional: positive for fatigue Eyes: negative Ears, nose, mouth, throat, and face: negative Respiratory: negative Cardiovascular: negative Gastrointestinal: negative Genitourinary:negative Integument/breast: negative Hematologic/lymphatic: negative Musculoskeletal:negative Neurological: negative Behavioral/Psych: negative Endocrine: negative Allergic/Immunologic: negative   PHYSICAL EXAMINATION: General appearance: alert, cooperative, fatigued and no distress Head: Normocephalic, without obvious abnormality, atraumatic Neck: no adenopathy, no JVD, supple, symmetrical, trachea midline and thyroid not enlarged, symmetric, no tenderness/mass/nodules Lymph nodes: Cervical, supraclavicular, and axillary nodes normal. Resp: clear to auscultation bilaterally Back: symmetric, no curvature. ROM normal. No CVA tenderness. Cardio: regular rate and rhythm, S1, S2 normal, no murmur, click, rub or gallop GI: soft, non-tender; bowel  sounds normal; no masses,  no organomegaly Extremities: extremities normal, atraumatic, no cyanosis or edema Neurologic: Alert and oriented X 3, normal strength and tone. Normal symmetric reflexes. Normal coordination and gait  ECOG PERFORMANCE STATUS: 1 - Symptomatic but completely ambulatory  Blood pressure (!) 142/96, pulse 87, temperature 98.2 F (36.8 C), temperature source Oral, resp. rate 18, height '5\' 7"'  (1.702 m), weight 274 lb 14.4 oz (124.7 kg), last menstrual period 09/28/2011, SpO2 100 %.  LABORATORY DATA: Lab Results  Component Value Date   WBC 5.4 05/11/2016   HGB 11.8 05/11/2016   HCT 35.5 05/11/2016   MCV 95.0 05/11/2016   PLT 235 05/11/2016      Chemistry      Component Value Date/Time   NA 143 05/11/2016 1202   K 4.3 05/11/2016 1202   CL 100 03/01/2016 1110   CL 104 08/18/2012 1154   CO2 27 05/11/2016 1202   BUN 21.8 05/11/2016 1202   CREATININE 0.8 05/11/2016 1202      Component Value Date/Time   CALCIUM 9.7 05/11/2016 1202   ALKPHOS 94 05/11/2016 1202   AST 18 05/11/2016  1202   ALT 16 05/11/2016 1202   BILITOT 0.72 05/11/2016 1202       RADIOGRAPHIC STUDIES: Ct Chest W Contrast  Result Date: 05/11/2016 CLINICAL DATA:  Restaging RIGHT lung cancer EXAM: CT CHEST, ABDOMEN, AND PELVIS WITH CONTRAST TECHNIQUE: Multidetector CT imaging of the chest, abdomen and pelvis was performed following the standard protocol during bolus administration of intravenous contrast. CONTRAST:  1 ISOVUE-300 IOPAMIDOL (ISOVUE-300) INJECTION 61% COMPARISON:  None. FINDINGS: CT CHEST FINDINGS Cardiovascular: Valvular calcification noted.  No pericardial fluid. Mediastinum/Nodes: No axillary or supraclavicular adenopathy. No mediastinal or hilar adenopathy. Lungs/Pleura: Volume loss in the RIGHT hemithorax. Rounded nodule in the RIGHT lower lobe measures 2.4 cm compared to 2.3 cm. There is pleural fluid in the RIGHT lung unchanged. Large anterior diaphragmatic hernia with the colon  extending into the RIGHT hemithorax This not changed from prior. The LEFT lung is hyperexpanded.  No nodularity.  Airways are normal. Musculoskeletal: Multiple sclerotic lesions throughout the ribs, spine and sternum. No change in pattern. CT ABDOMEN AND PELVIS FINDINGS Hepatobiliary: Small low-density lesion in the central LEFT hepatic lobe is present on multiple comparison exams. Normal gallbladder. Pancreas: Pancreas is normal. No ductal dilatation. No pancreatic inflammation. Spleen: Normal spleen Adrenals/urinary tract: Adrenal glands are normal. The RIGHT kidney is malrotated. Ureters bladder normal. Stomach/Bowel: Stomach, small bowel, appendix, and cecum are normal. The colon and rectosigmoid colon are normal. Again noted, a large section of the colon extends into the RIGHT chest. The terminal ileum is in the anterior RIGHT chest through the diaphragmatic hernia. Vascular/Lymphatic: Abdominal aorta is normal caliber. There is no retroperitoneal or periportal lymphadenopathy. No pelvic lymphadenopathy. Reproductive: Uterus normal. Other: No free fluid. Musculoskeletal: Confluent sclerotic metastasis within the pelvis hips and spine. IMPRESSION: Chest Impression: 1. Volume loss in the RIGHT hemithorax consistent with lobectomy. 2. Nodular round lesion in the RIGHT lower lobe is stable compared to prior. 3. Large diaphragmatic hernia with large portion of the RIGHT colon in the RIGHT hemithorax including the terminal ileum. 4. No evidence lung cancer recurrence or progression. Abdomen / Pelvis Impression: 1. No evidence of metastatic disease in the abdomen pelvis. 2. Widespread sclerotic metastasis unchanged compared to prior. Electronically Signed   By: Suzy Bouchard M.D.   On: 05/11/2016 17:01   Ct Abdomen Pelvis W Contrast  Result Date: 05/11/2016 CLINICAL DATA:  Restaging RIGHT lung cancer EXAM: CT CHEST, ABDOMEN, AND PELVIS WITH CONTRAST TECHNIQUE: Multidetector CT imaging of the chest, abdomen and  pelvis was performed following the standard protocol during bolus administration of intravenous contrast. CONTRAST:  1 ISOVUE-300 IOPAMIDOL (ISOVUE-300) INJECTION 61% COMPARISON:  None. FINDINGS: CT CHEST FINDINGS Cardiovascular: Valvular calcification noted.  No pericardial fluid. Mediastinum/Nodes: No axillary or supraclavicular adenopathy. No mediastinal or hilar adenopathy. Lungs/Pleura: Volume loss in the RIGHT hemithorax. Rounded nodule in the RIGHT lower lobe measures 2.4 cm compared to 2.3 cm. There is pleural fluid in the RIGHT lung unchanged. Large anterior diaphragmatic hernia with the colon extending into the RIGHT hemithorax This not changed from prior. The LEFT lung is hyperexpanded.  No nodularity.  Airways are normal. Musculoskeletal: Multiple sclerotic lesions throughout the ribs, spine and sternum. No change in pattern. CT ABDOMEN AND PELVIS FINDINGS Hepatobiliary: Small low-density lesion in the central LEFT hepatic lobe is present on multiple comparison exams. Normal gallbladder. Pancreas: Pancreas is normal. No ductal dilatation. No pancreatic inflammation. Spleen: Normal spleen Adrenals/urinary tract: Adrenal glands are normal. The RIGHT kidney is malrotated. Ureters bladder normal. Stomach/Bowel: Stomach, small bowel, appendix,  and cecum are normal. The colon and rectosigmoid colon are normal. Again noted, a large section of the colon extends into the RIGHT chest. The terminal ileum is in the anterior RIGHT chest through the diaphragmatic hernia. Vascular/Lymphatic: Abdominal aorta is normal caliber. There is no retroperitoneal or periportal lymphadenopathy. No pelvic lymphadenopathy. Reproductive: Uterus normal. Other: No free fluid. Musculoskeletal: Confluent sclerotic metastasis within the pelvis hips and spine. IMPRESSION: Chest Impression: 1. Volume loss in the RIGHT hemithorax consistent with lobectomy. 2. Nodular round lesion in the RIGHT lower lobe is stable compared to prior. 3. Large  diaphragmatic hernia with large portion of the RIGHT colon in the RIGHT hemithorax including the terminal ileum. 4. No evidence lung cancer recurrence or progression. Abdomen / Pelvis Impression: 1. No evidence of metastatic disease in the abdomen pelvis. 2. Widespread sclerotic metastasis unchanged compared to prior. Electronically Signed   By: Suzy Bouchard M.D.   On: 05/11/2016 17:01   [place] Cm  Result Date: 05/11/2016 CLINICAL DATA:  Cough variant asthma EXAM: CT PARANASAL SINUS LIMITED WITHOUT CONTRAST TECHNIQUE: Non-contiguous multidetector CT images of the paranasal sinuses were obtained in a single plane without contrast. COMPARISON:  None. FINDINGS: Paranasal sinuses well aerated and clear. No mucosal edema or air-fluid level. Calcified mass in the left ethmoid sinus compatible with osteoma. This is an incidental benign finding. Nasal septum deviated to the right. No acute bony abnormality. Regional soft tissues intact. IMPRESSION: Sinuses are clear.  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 Electronically Signed   By: Franchot Gallo M.D.   On: 05/11/2016 16:40    ASSESSMENT AND PLAN:  This is a very pleasant 56 years old African-American female with stage IV non-small cell lung cancer, adenocarcinoma with positive EGFR mutation initially diagnosed in June 2013. The patient has been on treatment with [name] 150 mg by mouth daily for the last 54 months. She has been tolerating her treatment well with no significant adverse effects. She had repeat CT scan of the chest, abdomen and pelvis performed recently. I personally and in dependently reviewed the scan and discuss the results with the patient today. Her scan showed no evidence for disease progression. I recommended for her to continue her current treatment with [name] 150 mg by mouth daily. I will see her back for follow-up visit in 2 months for reevaluation with repeat blood work. For the metastatic bone disease,  she will continue treatment with Xgeva every 8 weeks. For the back pain, she was giving refill of oxycodone. For the flulike symptoms, we will tested the patient today for influenza A and B. We will call her with the results and consider her for Tamiflu if positive. She was advised to call immediately if she has any concerning symptoms in the interval.  The patient voices understanding of current disease status and treatment options and is in agreement with the current care plan.  All questions were answered. The patient knows to call the clinic with any problems, questions or concerns. We can certainly see the patient much sooner if necessary.  Disclaimer: This note was dictated with voice recognition software. Similar sounding words can inadvertently be transcribed and may not be corrected upon review.

## 2016-05-12 NOTE — Progress Notes (Signed)
Spoke with pt and notified of results per Dr. Wert. Pt verbalized understanding and denied any questions. 

## 2016-05-12 NOTE — Progress Notes (Signed)
Injection not given, Beth [name] is not due at this time.

## 2016-05-13 ENCOUNTER — Telehealth: Payer: Self-pay | Admitting: Internal Medicine

## 2016-05-13 ENCOUNTER — Telehealth: Payer: Self-pay | Admitting: Medical Oncology

## 2016-05-13 ENCOUNTER — Telehealth: Payer: Self-pay | Admitting: *Deleted

## 2016-05-13 NOTE — Telephone Encounter (Signed)
Per 2/21 LOS and staff message I have scheduled appts. Notified the scheuelr

## 2016-05-13 NOTE — Telephone Encounter (Signed)
Appointment scheduled per patient request

## 2016-05-13 NOTE — Telephone Encounter (Signed)
Called pt -influenza panel negative

## 2016-05-14 ENCOUNTER — Ambulatory Visit: Payer: Medicare Other

## 2016-05-14 NOTE — Progress Notes (Signed)
No need for injection today per MD orders.

## 2016-05-15 ENCOUNTER — Other Ambulatory Visit: Payer: Self-pay | Admitting: Family Medicine

## 2016-05-18 ENCOUNTER — Other Ambulatory Visit: Payer: Self-pay | Admitting: Gastroenterology

## 2016-05-18 ENCOUNTER — Telehealth: Payer: Self-pay | Admitting: Medical Oncology

## 2016-05-18 DIAGNOSIS — C3490 Malignant neoplasm of unspecified part of unspecified bronchus or lung: Secondary | ICD-10-CM

## 2016-05-18 DIAGNOSIS — K219 Gastro-esophageal reflux disease without esophagitis: Secondary | ICD-10-CM | POA: Diagnosis not present

## 2016-05-18 DIAGNOSIS — Z1211 Encounter for screening for malignant neoplasm of colon: Secondary | ICD-10-CM | POA: Diagnosis not present

## 2016-05-18 NOTE — Telephone Encounter (Signed)
I told her the scan was fine. No change from the previous scan.

## 2016-05-18 NOTE — Telephone Encounter (Signed)
Left message -Per Julien Nordmann I told pt her scan was fine -no change from last time.

## 2016-05-18 NOTE — Telephone Encounter (Signed)
asking about ct scan results.She said it was not reviewed at last appt due to her flu symptoms.

## 2016-05-24 ENCOUNTER — Inpatient Hospital Stay
Admission: RE | Admit: 2016-05-24 | Discharge: 2016-05-24 | Disposition: A | Discharge status: Home / Self Care | Payer: Medicare Other | Source: Ambulatory Visit | Attending: Gastroenterology | Admitting: Gastroenterology

## 2016-05-28 ENCOUNTER — Ambulatory Visit (INDEPENDENT_AMBULATORY_CARE_PROVIDER_SITE_OTHER): Payer: Medicare Other | Admitting: Family Medicine

## 2016-05-28 ENCOUNTER — Ambulatory Visit: Payer: Medicare Other | Admitting: Internal Medicine

## 2016-05-28 ENCOUNTER — Telehealth: Payer: Self-pay | Admitting: *Deleted

## 2016-05-28 VITALS — BP 120/88 | HR 102 | Temp 99.3°F | Wt 276.4 lb

## 2016-05-28 DIAGNOSIS — R05 Cough: Secondary | ICD-10-CM | POA: Diagnosis not present

## 2016-05-28 DIAGNOSIS — J309 Allergic rhinitis, unspecified: Secondary | ICD-10-CM

## 2016-05-28 DIAGNOSIS — R059 Cough, unspecified: Secondary | ICD-10-CM

## 2016-05-28 MED ORDER — MOMETASONE FUROATE 50 MCG/ACT NA SUSP: 2.0000 | Freq: Every day | NASAL | 12 refills | Status: AC

## 2016-05-28 MED ORDER — DOXYCYCLINE HYCLATE 100 MG PO CAPS: 100.0000 mg | ORAL_CAPSULE | Freq: Two times a day (BID) | ORAL | 0 refills | Status: DC

## 2016-05-28 NOTE — Patient Instructions (Signed)
Follow up for any fever or increased shortness of breath. 

## 2016-05-28 NOTE — Progress Notes (Signed)
Subjective:     Patient ID: Beth [name], female   DOB: 02/22/1961, 56 y.o.   MRN: [id number]  HPI   Patient has stage IV adenocarcinoma lung and is seen with approximately one-month history of dry cough. She thinks she might be having some intermittent wheezing. She's not had any fevers or chills. No hemoptysis. She's had frequent symptoms of clear rhinorrhea and clear drainage from both eyes. She tried some type of antihistamine without relief. She has frequent nasal congestive symptoms. She had recent CT scans which did not show any progression of her cancer  Past Medical History:  Diagnosis Date  . Anxiety   . Arthritis of knee, degenerative   . Asthma   . Complication of anesthesia    difficulty waking up  . Depression   . Endocarditis    as teenager  . Fibromyalgia   . Flu-like symptoms 05/12/2016  . GERD (gastroesophageal reflux disease)   . Heart murmur   . History of radiation therapy 09/29/11-11/04/2011   right lung 2700cGy 15 sessions  . Hypertension   . Lung mass    R- ADENOCARCINOMA  . Osseous metastasis ([place]) 09/20/11   per PET scan  . Pleural effusion 08/30/11  . Shortness of breath   . Sleep apnea    no longer using CPAP   Past Surgical History:  Procedure Laterality Date  . THORACENTESIS  09/02/11, 09/09/11   right-side pleural effusion  . TUBAL LIGATION  2002    reports that she quit smoking about 5 years ago. Her smoking use included Cigarettes. She has a 7.50 pack-year smoking history. She has never used smokeless tobacco. She reports that she does not drink alcohol or use drugs. family history includes Alcohol abuse in her father; Hypertension in her maternal aunt, maternal grandfather, maternal grandmother, maternal uncle, and mother; Sarcoidosis in her sister. Allergies  Allergen Reactions  . Meloxicam     Possible GI bleed     Review of Systems  Constitutional: Negative for chills and fever.  HENT: Positive for congestion, postnasal drip and  rhinorrhea.   Respiratory: Positive for cough. Negative for shortness of breath.   Cardiovascular: Negative for chest pain.       Objective:   Physical Exam  Constitutional: She appears well-developed and well-nourished.  HENT:  Right Ear: External ear normal.  Left Ear: External ear normal.  Mouth/Throat: Oropharynx is clear and moist.  Neck: Neck supple.  Cardiovascular: Normal rate and regular rhythm.   Pulmonary/Chest:  She has slightly breath sounds right base which is chronic no rales. No wheezes. No respiratory distress.  Lymphadenopathy:    She has no cervical adenopathy.       Assessment:     One-month history of dry cough in a patient with stage IV adenocarcinoma lung. She does not have any focal exam findings or fever to suggest pneumonia at this time Suspect she has some allergic rhinitis symptoms which may be triggering some of her cough     Plan:     -Follow-up immediately for any fever or increased shortness of breath -Tried nasonex 2 sprays per nostril once daily for allergy symptoms and may continue daily antihistamine  [name] MD [facility]

## 2016-05-28 NOTE — Telephone Encounter (Signed)
Patient called and scheduled her next injection appt. Patietn aware of dte/time

## 2016-05-31 ENCOUNTER — Other Ambulatory Visit: Payer: Medicare Other

## 2016-05-31 ENCOUNTER — Ambulatory Visit: Payer: Medicare Other

## 2016-05-31 ENCOUNTER — Other Ambulatory Visit: Payer: Self-pay | Admitting: Internal Medicine

## 2016-05-31 ENCOUNTER — Telehealth: Payer: Self-pay | Admitting: Internal Medicine

## 2016-05-31 NOTE — Telephone Encounter (Signed)
Patient called and cancelled and rechedule appointment due to inclement weather

## 2016-06-01 ENCOUNTER — Other Ambulatory Visit: Payer: Self-pay | Admitting: Internal Medicine

## 2016-06-01 ENCOUNTER — Telehealth: Payer: Self-pay | Admitting: Medical Oncology

## 2016-06-01 DIAGNOSIS — R197 Diarrhea, unspecified: Secondary | ICD-10-CM

## 2016-06-01 NOTE — Telephone Encounter (Signed)
Called in refill for Lomotil.pt notified.

## 2016-06-02 ENCOUNTER — Telehealth: Payer: Self-pay | Admitting: Internal Medicine

## 2016-06-02 NOTE — Telephone Encounter (Signed)
Lab and Injection appointment rescheduled per inclement weather. Appointment confirmed with patient. 06/02/16

## 2016-06-03 ENCOUNTER — Ambulatory Visit (HOSPITAL_BASED_OUTPATIENT_CLINIC_OR_DEPARTMENT_OTHER): Payer: Medicare Other

## 2016-06-03 ENCOUNTER — Other Ambulatory Visit: Payer: Self-pay | Admitting: Medical Oncology

## 2016-06-03 ENCOUNTER — Other Ambulatory Visit (HOSPITAL_BASED_OUTPATIENT_CLINIC_OR_DEPARTMENT_OTHER): Payer: Medicare Other

## 2016-06-03 VITALS — BP 150/90 | HR 100 | Temp 98.4°F | Resp 18

## 2016-06-03 DIAGNOSIS — F419 Anxiety disorder, unspecified: Secondary | ICD-10-CM

## 2016-06-03 DIAGNOSIS — I10 Essential (primary) hypertension: Secondary | ICD-10-CM

## 2016-06-03 DIAGNOSIS — Z5111 Encounter for antineoplastic chemotherapy: Secondary | ICD-10-CM

## 2016-06-03 DIAGNOSIS — C7951 Secondary malignant neoplasm of bone: Secondary | ICD-10-CM

## 2016-06-03 DIAGNOSIS — C3491 Malignant neoplasm of unspecified part of right bronchus or lung: Secondary | ICD-10-CM

## 2016-06-03 LAB — CBC WITH DIFFERENTIAL/PLATELET
BASO%: 0.3 % (ref 0.0–2.0)
Basophils Absolute: 0 10*3/uL (ref 0.0–0.1)
EOS%: 2.4 % (ref 0.0–7.0)
Eosinophils Absolute: 0.2 10*3/uL (ref 0.0–0.5)
HCT: 35.9 % (ref 34.8–46.6)
HGB: 11.9 g/dL (ref 11.6–15.9)
LYMPH%: 23.9 % (ref 14.0–49.7)
MCH: 31 pg (ref 25.1–34.0)
MCHC: 33 g/dL (ref 31.5–36.0)
MCV: 93.9 fL (ref 79.5–101.0)
MONO#: 0.5 10*3/uL (ref 0.1–0.9)
MONO%: 7.4 % (ref 0.0–14.0)
NEUT#: 4.3 10*3/uL (ref 1.5–6.5)
NEUT%: 66 % (ref 38.4–76.8)
Platelets: 260 10*3/uL (ref 145–400)
RBC: 3.82 10*6/uL (ref 3.70–5.45)
RDW: 14.1 % (ref 11.2–14.5)
WBC: 6.5 10*3/uL (ref 3.9–10.3)
lymph#: 1.6 10*3/uL (ref 0.9–3.3)

## 2016-06-03 LAB — COMPREHENSIVE METABOLIC PANEL
ALBUMIN: 3.6 g/dL (ref 3.5–5.0)
ALK PHOS: 92 U/L (ref 40–150)
ALT: 21 U/L (ref 0–55)
AST: 26 U/L (ref 5–34)
Anion Gap: 10 mEq/L (ref 3–11)
BILIRUBIN TOTAL: 1 mg/dL (ref 0.20–1.20)
BUN: 12.6 mg/dL (ref 7.0–26.0)
CALCIUM: 9 mg/dL (ref 8.4–10.4)
CO2: 29 mEq/L (ref 22–29)
CREATININE: 0.8 mg/dL (ref 0.6–1.1)
Chloride: 104 mEq/L (ref 98–109)
EGFR: >90 mL/min/{1.73_m2} (ref >90)
Glucose: 99 mg/dl (ref 70–140)
Potassium: 3.7 mEq/L (ref 3.5–5.1)
SODIUM: 142 meq/L (ref 136–145)
TOTAL PROTEIN: 7.6 g/dL (ref 6.4–8.3)

## 2016-06-03 MED ORDER — DENOSUMAB 120 MG/1.7ML ~~LOC~~ SOLN
120.0000 mg | Freq: Once | SUBCUTANEOUS | Status: AC
  Administered 2016-06-03: 120 mg via SUBCUTANEOUS
  Filled 2016-06-03: qty 1.7

## 2016-06-03 NOTE — Patient Instructions (Signed)
Denosumab injection  What is this medicine?  DENOSUMAB (den oh sue mab) slows bone breakdown. Prolia is used to treat osteoporosis in women after menopause and in men. Xgeva is used to prevent bone fractures and other bone problems caused by cancer bone metastases. Xgeva is also used to treat giant cell tumor of the bone.  This medicine may be used for other purposes; ask your health care provider or pharmacist if you have questions.  What should I tell my health care provider before I take this medicine?  They need to know if you have any of these conditions:  -dental disease  -eczema  -infection or history of infections  -kidney disease or on dialysis  -low blood calcium or vitamin D  -malabsorption syndrome  -scheduled to have surgery or tooth extraction  -taking medicine that contains denosumab  -thyroid or parathyroid disease  -an unusual reaction to denosumab, other medicines, foods, dyes, or preservatives  -pregnant or trying to get pregnant  -breast-feeding  How should I use this medicine?  This medicine is for injection under the skin. It is given by a health care professional in a hospital or clinic setting.  If you are getting Prolia, a special MedGuide will be given to you by the pharmacist with each prescription and refill. Be sure to read this information carefully each time.  For Prolia, talk to your pediatrician regarding the use of this medicine in children. Special care may be needed. For Xgeva, talk to your pediatrician regarding the use of this medicine in children. While this drug may be prescribed for children as young as 13 years for selected conditions, precautions do apply.  Overdosage: If you think you have taken too much of this medicine contact a poison control center or emergency room at once.  NOTE: This medicine is only for you. Do not share this medicine with others.  What if I miss a dose?  It is important not to miss your dose. Call your doctor or health care professional if you are  unable to keep an appointment.  What may interact with this medicine?  Do not take this medicine with any of the following medications:  -other medicines containing denosumab  This medicine may also interact with the following medications:  -medicines that suppress the immune system  -medicines that treat cancer  -steroid medicines like prednisone or cortisone  This list may not describe all possible interactions. Give your health care provider a list of all the medicines, herbs, non-prescription drugs, or dietary supplements you use. Also tell them if you smoke, drink alcohol, or use illegal drugs. Some items may interact with your medicine.  What should I watch for while using this medicine?  Visit your doctor or health care professional for regular checks on your progress. Your doctor or health care professional may order blood tests and other tests to see how you are doing.  Call your doctor or health care professional if you get a cold or other infection while receiving this medicine. Do not treat yourself. This medicine may decrease your body's ability to fight infection.  You should make sure you get enough calcium and vitamin D while you are taking this medicine, unless your doctor tells you not to. Discuss the foods you eat and the vitamins you take with your health care professional.  See your dentist regularly. Brush and floss your teeth as directed. Before you have any dental work done, tell your dentist you are receiving this medicine.  Do   not become pregnant while taking this medicine or for 5 months after stopping it. Women should inform their doctor if they wish to become pregnant or think they might be pregnant. There is a potential for serious side effects to an unborn child. Talk to your health care professional or pharmacist for more information.  What side effects may I notice from receiving this medicine?  Side effects that you should report to your doctor or health care professional as soon as  possible:  -allergic reactions like skin rash, itching or hives, swelling of the face, lips, or tongue  -breathing problems  -chest pain  -fast, irregular heartbeat  -feeling faint or lightheaded, falls  -fever, chills, or any other sign of infection  -muscle spasms, tightening, or twitches  -numbness or tingling  -skin blisters or bumps, or is dry, peels, or red  -slow healing or unexplained pain in the mouth or jaw  -unusual bleeding or bruising  Side effects that usually do not require medical attention (Report these to your doctor or health care professional if they continue or are bothersome.):  -muscle pain  -stomach upset, gas  This list may not describe all possible side effects. Call your doctor for medical advice about side effects. You may report side effects to FDA at 1-800-FDA-1088.  Where should I keep my medicine?  This medicine is only given in a clinic, doctor's office, or other health care setting and will not be stored at home.  NOTE: This sheet is a summary. It may not cover all possible information. If you have questions about this medicine, talk to your doctor, pharmacist, or health care provider.      2016, Elsevier/Gold Standard. (2011-09-06 12:37:47)

## 2016-06-04 ENCOUNTER — Other Ambulatory Visit: Payer: Self-pay | Admitting: Medical Oncology

## 2016-06-04 DIAGNOSIS — Z5111 Encounter for antineoplastic chemotherapy: Secondary | ICD-10-CM

## 2016-06-04 DIAGNOSIS — C3490 Malignant neoplasm of unspecified part of unspecified bronchus or lung: Secondary | ICD-10-CM

## 2016-06-04 MED ORDER — OXYCODONE HCL 10 MG PO TABS: 10.0000 mg | ORAL_TABLET | Freq: Four times a day (QID) | ORAL | 0 refills | Status: DC | PRN

## 2016-06-07 ENCOUNTER — Telehealth: Payer: Self-pay | Admitting: Family Medicine

## 2016-06-07 MED ORDER — PREDNISONE 10 MG PO TABS: ORAL_TABLET | ORAL | 0 refills | Status: DC

## 2016-06-07 MED FILL — oxyCODONE HCL 10 MG TABS: 10 | 22 days supply | Qty: 90 | Fill #0

## 2016-06-07 NOTE — Telephone Encounter (Signed)
Please triage

## 2016-06-07 NOTE — Telephone Encounter (Signed)
May call in prednisone 10 mg taper: 4-4-4-3-3-2-2 (22 tablets over 7 days)

## 2016-06-07 NOTE — Telephone Encounter (Signed)
LMTCB

## 2016-06-07 NOTE — Telephone Encounter (Signed)
Spoke with pt and she c/o increased cough with white mucus and wheeze. She reports her symptoms have increased over the weekend. She has started doxycyline (from 3/9 visit) this past Friday with minimal improvement in symptoms. Pt would like to know if you would give oral prednisone taper as well.  Dr. Elease Hashimoto - Please advise. Thanks!

## 2016-06-07 NOTE — Telephone Encounter (Signed)
Spoke with pt and advised. She will take oral prednisone taper and call office if not improving. Rx sent. Nothing further needed.

## 2016-06-07 NOTE — Telephone Encounter (Signed)
° ° °  Pt call to ask if predisone for wheezing and cough   Pharmacy [address]

## 2016-06-10 ENCOUNTER — Telehealth: Payer: Self-pay | Admitting: Family Medicine

## 2016-06-10 NOTE — Telephone Encounter (Signed)
Pt states she  Saw Dr Elease Hashimoto last week and forgot to mention she thinks the venlafaxine XR (EFFEXOR-XR) 75 MG 24 hr capsule Is not working at all. Pt would like to switch to something else, and wants to know if there may be something new available she can try

## 2016-06-11 NOTE — Telephone Encounter (Signed)
Recommend follow-up to discuss. Getting off Effexor will require slow tapering

## 2016-06-11 NOTE — Telephone Encounter (Signed)
Attempted to call patient, but answered by fax.  Will attempt at another time.

## 2016-06-14 NOTE — Telephone Encounter (Signed)
Left message on machine for patient to return our call 

## 2016-06-15 NOTE — Telephone Encounter (Signed)
Left message on machine for patient to return our call 

## 2016-06-25 ENCOUNTER — Telehealth: Payer: Self-pay | Admitting: Family Medicine

## 2016-06-25 MED ORDER — CYCLOBENZAPRINE HCL 10 MG PO TABS: 10.0000 mg | ORAL_TABLET | Freq: Three times a day (TID) | ORAL | 0 refills | Status: DC | PRN

## 2016-06-25 NOTE — Telephone Encounter (Signed)
Pt would like to know if Dr Elease Hashimoto will call in a muscle relaxer for her back pain.  Pt states he has done in the past.  cyclobenzaprine (FLEXERIL) 10 MG tablet   [facility] [place], [place] - [address] AT [place]

## 2016-06-25 NOTE — Telephone Encounter (Signed)
May refill #30

## 2016-06-25 NOTE — Telephone Encounter (Signed)
Rx sent and patient is aware

## 2016-06-25 NOTE — Telephone Encounter (Signed)
Patient is aware 

## 2016-06-30 ENCOUNTER — Telehealth: Payer: Self-pay | Admitting: Internal Medicine

## 2016-06-30 ENCOUNTER — Telehealth: Payer: Self-pay | Admitting: Family Medicine

## 2016-06-30 MED ORDER — PREDNISONE 10 MG PO TABS: ORAL_TABLET | ORAL | 0 refills | Status: DC

## 2016-06-30 MED ORDER — AMOXICILLIN-POT CLAVULANATE 875-125 MG PO TABS: 1.0000 | ORAL_TABLET | Freq: Two times a day (BID) | ORAL | 0 refills | Status: DC

## 2016-06-30 NOTE — Telephone Encounter (Signed)
Patient will call back to schedule an appointment. 

## 2016-06-30 NOTE — Telephone Encounter (Signed)
Needs to be seen

## 2016-06-30 NOTE — Telephone Encounter (Signed)
Pt aware of MW's recommendations.  Rx sent to preferred pharmacy. Pt aware and voiced her understanding. Nothing further needed.

## 2016-06-30 NOTE — Telephone Encounter (Signed)
Ok  But rec Augmentin 875 mg take one pill twice daily  X 10 days - take at breakfast and supper with large glass of water.  It would help reduce the usual side effects (diarrhea and yeast infections) if you ate cultured yogurt at lunch.  Prednisone 10 mg take  4 each am x 2 days,   2 each am x 2 days,  1 each am x 2 days and stop

## 2016-06-30 NOTE — Telephone Encounter (Signed)
° °  Pt said she is still wheezing and congestion,cough and would like to another round of [place]

## 2016-06-30 NOTE — Telephone Encounter (Signed)
MW  Please Advise-  Pt called in requesting a refill of Augmentin.  Pt is c/o of her allergies flaring up, she is wheezing,coughing,has lots of nasal congestion,Denies fever    Allergies  Allergen Reactions  . Meloxicam     Possible GI bleed   Current Outpatient Prescriptions on File Prior to Visit  Medication Sig Dispense Refill  . albuterol-ipratropium (COMBIVENT) 18-103 MCG/ACT inhaler Inhale 1 puff into the lungs every 6 (six) hours as needed for wheezing or shortness of breath.    Marland Kitchen amLODipine (NORVASC) 5 MG tablet TAKE 1 TABLET BY MOUTH EVERY DAY 90 tablet 1  . COMBIVENT RESPIMAT 20-100 MCG/ACT AERS respimat INHALE 1 PUFF INTO THE LUNGS EVERY 6 HOURS AS NEEDED FOR WHEEZING 4 g 3  . cyclobenzaprine (FLEXERIL) 10 MG tablet Take 1 tablet (10 mg total) by mouth 3 (three) times daily as needed for muscle spasms. 30 tablet 0  . diphenhydrAMINE (BENADRYL) 25 MG tablet Take 25 mg by mouth every 6 (six) hours as needed.    . diphenoxylate-atropine (LOMOTIL) 2.5-0.025 MG tablet TAKE 2 TABLETS BY MOUTH FOUR TIMES DAILY AS NEEDED FOR DIARRHEA OR LOOSE STOOLS. ALTERNATE WITH IMMODIUM AFTER EACH STOOL. MAX OF 8 PER DAY 30 tablet 0  . doxycycline (VIBRAMYCIN) 100 MG capsule Take 1 capsule (100 mg total) by mouth 2 (two) times daily. 20 capsule 0  . ergocalciferol (VITAMIN D2) 50000 units capsule Take 1 capsule (50,000 Units total) by mouth once a week. 12 capsule 3  . erlotinib ([name]) 150 MG tablet Take 1 tablet (150 mg total) by mouth daily. Take on an empty stomach 1 hour before meals or 2 hours after. 30 tablet 1  . famotidine (PEPCID) 20 MG tablet One after bfast and after supper 60 tablet 2  . fish oil-omega-3 fatty acids 1000 MG capsule Take 2 g by mouth daily.    . furosemide (LASIX) 20 MG tablet TAKE 1 TABLET BY MOUTH DAILY AS NEEDED FOR SEVERE EDEMA 30 tablet 0  . hydrochlorothiazide (HYDRODIURIL) 25 MG tablet TAKE 1 TABLET BY MOUTH EVERY DAY 90 tablet 2  . ibuprofen (ADVIL,MOTRIN) 800  MG tablet TAKE 1 TABLET(800 MG) BY MOUTH DAILY AS NEEDED 30 tablet 0  . loperamide (IMODIUM) 2 MG capsule Take 2 mg by mouth 4 (four) times daily as needed for diarrhea or loose stools.    . mometasone (NASONEX) 50 MCG/ACT nasal spray Place 2 sprays into the nose daily. 17 g 12  . Multiple Vitamins-Minerals (MULTIVITAMIN WITH MINERALS) tablet Take 1 tablet by mouth daily.    . mupirocin ointment (BACTROBAN) 2 % APPLY INTO EACH NOSTRIL TWICE DAILY 22 g 0  . NONFORMULARY OR COMPOUNDED ITEM Cbcd, cmp,ua   Dx hx fever, hx endocarditis, 1 each 0  . Oxycodone HCl 10 MG TABS Take 1 tablet (10 mg total) by mouth every 6 (six) hours as needed. 90 tablet 0  . predniSONE (DELTASONE) 10 MG tablet TAKE '40MG'$  X3 DAYS, '30MG'$  X2 DAYS, '20MG'$  X2 DAYS THEN STOP 22 tablet 0  . PREMARIN vaginal cream Place 1 application vaginally 2 (two) times daily.  5  . prochlorperazine (COMPAZINE) 10 MG tablet TAKE ONE TABLET BY MOUTH EVERY 6 HOURS AS NEEDED 30 tablet 1  . temazepam (RESTORIL) 15 MG capsule Take 1 capsule (15 mg total) by mouth at bedtime as needed for sleep. 30 capsule 0  . venlafaxine XR (EFFEXOR-XR) 75 MG 24 hr capsule TAKE 1 CAPSULE(75 MG) BY MOUTH 1 TIME 30 capsule 5  No current facility-administered medications on file prior to visit.

## 2016-07-07 ENCOUNTER — Other Ambulatory Visit: Payer: Self-pay | Admitting: *Deleted

## 2016-07-07 ENCOUNTER — Other Ambulatory Visit: Payer: Self-pay | Admitting: Internal Medicine

## 2016-07-07 DIAGNOSIS — Z5111 Encounter for antineoplastic chemotherapy: Secondary | ICD-10-CM

## 2016-07-07 DIAGNOSIS — C3490 Malignant neoplasm of unspecified part of unspecified bronchus or lung: Secondary | ICD-10-CM

## 2016-07-07 MED ORDER — OXYCODONE HCL 10 MG PO TABS: 10.0000 mg | ORAL_TABLET | Freq: Four times a day (QID) | ORAL | 0 refills | Status: DC | PRN

## 2016-07-07 NOTE — Telephone Encounter (Signed)
Pt called with request for percocet refill. Reviewed with MD ok to refill with quantity of 60. Pt notified rx ready for pick up.

## 2016-07-08 ENCOUNTER — Encounter: Payer: Self-pay | Admitting: *Deleted

## 2016-07-08 ENCOUNTER — Telehealth: Payer: Self-pay | Admitting: *Deleted

## 2016-07-08 MED FILL — oxyCODONE HCL 10 MG TABS: 10 | 15 days supply | Qty: 60 | Fill #0

## 2016-07-08 NOTE — Telephone Encounter (Signed)
Pt here to pick pain meds, questioned quanity of 60, requested to have more. Pt frustrated states she would like to see different provider. Discussed with pt I can assist her with transferring her care. Pt states " I'd like to see a female physician" Informed pt I would put in request for her and she would get a call from scheduling regarding new MD and the appt date/time. Pt then states " never mind, I will look for a doctor outside the [facility]. I will just leave everything the way it is." Confirmed with pt her appt on Monday lab/MD. Pt verbalized and confirmed she will be here. No further concerns.

## 2016-07-09 ENCOUNTER — Telehealth: Payer: Self-pay | Admitting: *Deleted

## 2016-07-09 NOTE — Telephone Encounter (Signed)
Beth [name] here to discuss pt would like to continue her care here at [facility], requesting a Female MD for her care. Called pt, uable to reach,lmovm advised I am aware she would like to continue her care here and I have requested transfer of care with Dr. Burr Medico.  Requested pt to call back to confirm.  RN will call with additional information regarding this concern and transfer of care.

## 2016-07-12 ENCOUNTER — Other Ambulatory Visit: Payer: Self-pay | Admitting: Medical Oncology

## 2016-07-12 ENCOUNTER — Ambulatory Visit (HOSPITAL_BASED_OUTPATIENT_CLINIC_OR_DEPARTMENT_OTHER): Payer: PRIVATE HEALTH INSURANCE | Admitting: Internal Medicine

## 2016-07-12 ENCOUNTER — Ambulatory Visit: Payer: Medicare Other

## 2016-07-12 ENCOUNTER — Other Ambulatory Visit (HOSPITAL_BASED_OUTPATIENT_CLINIC_OR_DEPARTMENT_OTHER): Payer: PRIVATE HEALTH INSURANCE

## 2016-07-12 ENCOUNTER — Telehealth: Payer: Self-pay | Admitting: Internal Medicine

## 2016-07-12 ENCOUNTER — Encounter: Payer: Self-pay | Admitting: Internal Medicine

## 2016-07-12 VITALS — BP 152/77 | HR 57 | Temp 98.5°F | Resp 19 | Ht 67.0 in | Wt 274.5 lb

## 2016-07-12 DIAGNOSIS — C7951 Secondary malignant neoplasm of bone: Secondary | ICD-10-CM

## 2016-07-12 DIAGNOSIS — F419 Anxiety disorder, unspecified: Secondary | ICD-10-CM

## 2016-07-12 DIAGNOSIS — C3491 Malignant neoplasm of unspecified part of right bronchus or lung: Secondary | ICD-10-CM

## 2016-07-12 DIAGNOSIS — M549 Dorsalgia, unspecified: Secondary | ICD-10-CM

## 2016-07-12 DIAGNOSIS — I10 Essential (primary) hypertension: Secondary | ICD-10-CM

## 2016-07-12 DIAGNOSIS — Z8659 Personal history of other mental and behavioral disorders: Secondary | ICD-10-CM

## 2016-07-12 DIAGNOSIS — Z5111 Encounter for antineoplastic chemotherapy: Secondary | ICD-10-CM

## 2016-07-12 LAB — COMPREHENSIVE METABOLIC PANEL
ALT: 26 U/L (ref 0–55)
AST: 32 U/L (ref 5–34)
Albumin: 3.5 g/dL (ref 3.5–5.0)
Alkaline Phosphatase: 85 U/L (ref 40–150)
Anion Gap: 12 mEq/L — ABNORMAL HIGH (ref 3–11)
BUN: 11.3 mg/dL (ref 7.0–26.0)
CALCIUM: 9.3 mg/dL (ref 8.4–10.4)
CHLORIDE: 104 meq/L (ref 98–109)
CO2: 31 meq/L — AB (ref 22–29)
CREATININE: 0.8 mg/dL (ref 0.6–1.1)
EGFR: >90 mL/min/{1.73_m2} (ref >90)
Glucose: 86 mg/dl (ref 70–140)
Potassium: 3.5 mEq/L (ref 3.5–5.1)
Sodium: 146 mEq/L — ABNORMAL HIGH (ref 136–145)
Total Bilirubin: 0.6 mg/dL (ref 0.20–1.20)
Total Protein: 7.3 g/dL (ref 6.4–8.3)

## 2016-07-12 LAB — CBC WITH DIFFERENTIAL/PLATELET
BASO%: 0.8 % (ref 0.0–2.0)
BASOS ABS: 0 10*3/uL (ref 0.0–0.1)
EOS ABS: 0.2 10*3/uL (ref 0.0–0.5)
EOS%: 3.8 % (ref 0.0–7.0)
HCT: 36 % (ref 34.8–46.6)
HGB: 11.8 g/dL (ref 11.6–15.9)
LYMPH%: 32.2 % (ref 14.0–49.7)
MCH: 31.2 pg (ref 25.1–34.0)
MCHC: 32.9 g/dL (ref 31.5–36.0)
MCV: 94.7 fL (ref 79.5–101.0)
MONO#: 0.6 10*3/uL (ref 0.1–0.9)
MONO%: 11.4 % (ref 0.0–14.0)
NEUT#: 2.7 10*3/uL (ref 1.5–6.5)
NEUT%: 51.8 % (ref 38.4–76.8)
Platelets: 218 10*3/uL (ref 145–400)
RBC: 3.8 10*6/uL (ref 3.70–5.45)
RDW: 15.2 % — ABNORMAL HIGH (ref 11.2–14.5)
WBC: 5.2 10*3/uL (ref 3.9–10.3)
lymph#: 1.7 10*3/uL (ref 0.9–3.3)

## 2016-07-12 MED ORDER — DENOSUMAB 120 MG/1.7ML ~~LOC~~ SOLN: 120.0000 mg | Freq: Once | SUBCUTANEOUS | Status: DC

## 2016-07-12 MED ORDER — MIRTAZAPINE 30 MG PO TABS: 30.0000 mg | ORAL_TABLET | Freq: Every day | ORAL | 2 refills | Status: DC

## 2016-07-12 NOTE — Telephone Encounter (Signed)
Gave patient AVS and calender per 4/23 los. [facility] to contact patient with Ct schedule.

## 2016-07-12 NOTE — Progress Notes (Signed)
Patient is not due for xgeva today as it is due every 8 weeks per Janith Lima, Pharmacist. Patient made aware and verbalized understanding.

## 2016-07-12 NOTE — Progress Notes (Signed)
[place] [phone]   [phone]  OFFICE PROGRESS NOTE  [name], MD [place] [address]  DIAGNOSIS: Metastatic non-small cell lung cancer, adenocarcinoma with positive EGFR mutation in exon 19 diagnosed in June 2013  PRIOR THERAPY: Palliative radiotherapy to the right lung under the care of Dr. Valere Dross completed on 10/29/2011.  CURRENT THERAPY: 1) [name] 150 mg by mouth daily started 10/11/2011, status post 56 months of treatment. 2) Xgeva 120 mg subcutaneously every 8 weeks.  INTERVAL HISTORY: Beth [name] 56 y.o. female returns to the clinic today for follow-up visit. The patient is feeling fine today with no specific complaints except for mild numbness in her left upper extremity. She denied having any chest pain, shortness of breath but has intermittent cough with no hemoptysis. She denied having any recent weight loss or night sweats. She has no nausea, vomiting, or constipation. She had few episodes of diarrhea. The patient was a bit upset few days ago because she was giving a refill of 60 tablets of Percocet instead of 90. She requested change of provider that day. We did honor request and we were planning to change her care to Dr. Burr Medico. Over the weekend the patient changes her mind again and requested to continue her care with me. She mentioned that she had a bad day that time and was a little bit emotional. She is here today for reevaluation and repeat blood work.   MEDICAL HISTORY: Past Medical History:  Diagnosis Date  . Anxiety   . Arthritis of knee, degenerative   . Asthma   . Complication of anesthesia    difficulty waking up  . Depression   . Endocarditis    as teenager  . Fibromyalgia   . Flu-like symptoms 05/12/2016  . GERD (gastroesophageal reflux disease)   . Heart murmur   . History of radiation therapy 09/29/11-11/04/2011   right lung 2700cGy 15 sessions  . Hypertension   . Lung  mass    R- ADENOCARCINOMA  . Osseous metastasis ([place]) 09/20/11   per PET scan  . Pleural effusion 08/30/11  . Shortness of breath   . Sleep apnea    no longer using CPAP    ALLERGIES:  is allergic to meloxicam.  MEDICATIONS:  Current Outpatient Prescriptions  Medication Sig Dispense Refill  . albuterol-ipratropium (COMBIVENT) 18-103 MCG/ACT inhaler Inhale 1 puff into the lungs every 6 (six) hours as needed for wheezing or shortness of breath.    Marland Kitchen amLODipine (NORVASC) 5 MG tablet TAKE 1 TABLET BY MOUTH EVERY DAY 90 tablet 1  . COMBIVENT RESPIMAT 20-100 MCG/ACT AERS respimat INHALE 1 PUFF INTO THE LUNGS EVERY 6 HOURS AS NEEDED FOR WHEEZING 4 g 3  . cyclobenzaprine (FLEXERIL) 10 MG tablet Take 1 tablet (10 mg total) by mouth 3 (three) times daily as needed for muscle spasms. 30 tablet 0  . diphenhydrAMINE (BENADRYL) 25 MG tablet Take 25 mg by mouth every 6 (six) hours as needed.    . diphenoxylate-atropine (LOMOTIL) 2.5-0.025 MG tablet TAKE 2 TABLETS BY MOUTH FOUR TIMES DAILY AS NEEDED FOR DIARRHEA OR LOOSE STOOLS. ALTERNATE WITH IMMODIUM AFTER EACH STOOL. MAX OF 8 PER DAY 30 tablet 0  . ergocalciferol (VITAMIN D2) 50000 units capsule Take 1 capsule (50,000 Units total) by mouth once a week. 12 capsule 3  . erlotinib ([name]) 150 MG tablet Take 1 tablet (150 mg total) by mouth daily. Take on an empty stomach 1 hour  before meals or 2 hours after. 30 tablet 1  . famotidine (PEPCID) 20 MG tablet One after bfast and after supper 60 tablet 2  . fish oil-omega-3 fatty acids 1000 MG capsule Take 2 g by mouth daily.    . furosemide (LASIX) 20 MG tablet TAKE 1 TABLET BY MOUTH DAILY AS NEEDED FOR SEVERE EDEMA 30 tablet 0  . hydrochlorothiazide (HYDRODIURIL) 25 MG tablet TAKE 1 TABLET BY MOUTH EVERY DAY 90 tablet 2  . ibuprofen (ADVIL,MOTRIN) 800 MG tablet TAKE 1 TABLET(800 MG) BY MOUTH DAILY AS NEEDED 30 tablet 0  . loperamide (IMODIUM) 2 MG capsule Take 2 mg by mouth 4 (four) times daily as needed for  diarrhea or loose stools.    . mometasone (NASONEX) 50 MCG/ACT nasal spray Place 2 sprays into the nose daily. 17 g 12  . Multiple Vitamins-Minerals (MULTIVITAMIN WITH MINERALS) tablet Take 1 tablet by mouth daily.    . mupirocin ointment (BACTROBAN) 2 % APPLY INTO EACH NOSTRIL TWICE DAILY 22 g 0  . NONFORMULARY OR COMPOUNDED ITEM Cbcd, cmp,ua   Dx hx fever, hx endocarditis, 1 each 0  . Oxycodone HCl 10 MG TABS Take 1 tablet (10 mg total) by mouth every 6 (six) hours as needed. 60 tablet 0  . predniSONE (DELTASONE) 10 MG tablet 4 tabs x 2 days, 2 tabs x 2 days, 1 tab x 2 days then stop 14 tablet 0  . PREMARIN vaginal cream Place 1 application vaginally 2 (two) times daily.  5  . prochlorperazine (COMPAZINE) 10 MG tablet TAKE ONE TABLET BY MOUTH EVERY 6 HOURS AS NEEDED 30 tablet 1  . temazepam (RESTORIL) 15 MG capsule Take 1 capsule (15 mg total) by mouth at bedtime as needed for sleep. 30 capsule 0  . venlafaxine XR (EFFEXOR-XR) 75 MG 24 hr capsule TAKE 1 CAPSULE(75 MG) BY MOUTH 1 TIME 30 capsule 5   No current facility-administered medications for this visit.     SURGICAL HISTORY:  Past Surgical History:  Procedure Laterality Date  . THORACENTESIS  09/02/11, 09/09/11   right-side pleural effusion  . TUBAL LIGATION  2002    REVIEW OF SYSTEMS:  Constitutional: positive for fatigue Eyes: negative Ears, nose, mouth, throat, and face: negative Respiratory: positive for dyspnea on exertion Cardiovascular: negative Gastrointestinal: positive for diarrhea Genitourinary:negative Integument/breast: negative Hematologic/lymphatic: negative Musculoskeletal:positive for back pain Neurological: negative Behavioral/Psych: negative Endocrine: negative Allergic/Immunologic: negative   PHYSICAL EXAMINATION: General appearance: alert, cooperative, fatigued and no distress Head: Normocephalic, without obvious abnormality, atraumatic Neck: no adenopathy, no JVD, supple, symmetrical, trachea midline  and thyroid not enlarged, symmetric, no tenderness/mass/nodules Lymph nodes: Cervical, supraclavicular, and axillary nodes normal. Resp: clear to auscultation bilaterally Back: symmetric, no curvature. ROM normal. No CVA tenderness. Cardio: regular rate and rhythm, S1, S2 normal, no murmur, click, rub or gallop GI: soft, non-tender; bowel sounds normal; no masses,  no organomegaly Extremities: extremities normal, atraumatic, no cyanosis or edema Neurologic: Alert and oriented X 3, normal strength and tone. Normal symmetric reflexes. Normal coordination and gait  ECOG PERFORMANCE STATUS: 1 - Symptomatic but completely ambulatory  Blood pressure (!) 152/77, pulse (!) 57, temperature 98.5 F (36.9 C), temperature source Oral, resp. rate 19, height _0  (1.702 m), weight 274 lb 8 oz (124.5 kg), last menstrual period 09/28/2011, SpO2 95 %.  LABORATORY DATA: Lab Results  Component Value Date   WBC 5.2 07/12/2016   HGB 11.8 07/12/2016   HCT 36.0 07/12/2016   MCV 94.7 07/12/2016   PLT  218 07/12/2016      Chemistry      Component Value Date/Time   NA 146 (H) 07/12/2016 1021   K 3.5 07/12/2016 1021   CL 100 03/01/2016 1110   CL 104 08/18/2012 1154   CO2 31 (H) 07/12/2016 1021   BUN 11.3 07/12/2016 1021   CREATININE 0.8 07/12/2016 1021      Component Value Date/Time   CALCIUM 9.3 07/12/2016 1021   ALKPHOS 85 07/12/2016 1021   AST 32 07/12/2016 1021   ALT 26 07/12/2016 1021   BILITOT 0.60 07/12/2016 1021       RADIOGRAPHIC STUDIES: No results found.  ASSESSMENT AND PLAN:  This is a very pleasant 56 years old African-American female with a stage IV non-small cell lung cancer, adenocarcinoma with positive EGFR mutation in exon 46 diagnosed in June 2013. The patient has been on treatment with [name] 150 mg by mouth daily for the last 56 months and has been 2 rating her treatment well except for occasional episodes of diarrhea. She continues to have shortness of breath and this  is likely to be partially due to the large diaphragmatic hernia. I may consider sending the patient for surgical evaluation at some point if it is getting worse. For the metastatic bone disease, she will continue her current treatment with Xgeva every 8 weeks. For the chronic back pain, she was giving refill of oxycodone few days ago and she was advised to call once she is low on her pain medication. She understands that I will not be able to give her a large amount of pain medication at a time but I will refill it as needed. I will see the patient back for follow-up visit in one month's for evaluation after repeating CT scan of the chest, abdomen and pelvis for restaging of her disease. She was advised to call immediately if she has any concerning symptoms in the interval. The patient voices understanding of current disease status and treatment options and is in agreement with the current care plan. All questions were answered. The patient knows to call the clinic with any problems, questions or concerns. We can certainly see the patient much sooner if necessary.  Disclaimer: This note was dictated with voice recognition software. Similar sounding words can inadvertently be transcribed and may not be corrected upon review.

## 2016-07-13 ENCOUNTER — Other Ambulatory Visit: Payer: Self-pay | Admitting: Internal Medicine

## 2016-07-16 ENCOUNTER — Telehealth: Payer: Self-pay | Admitting: Medical Oncology

## 2016-07-16 NOTE — Telephone Encounter (Signed)
Returned call -pt asking if ct chest will show pneumonia? I told her yes.

## 2016-07-20 ENCOUNTER — Other Ambulatory Visit: Payer: Self-pay | Admitting: Family Medicine

## 2016-07-20 ENCOUNTER — Encounter (HOSPITAL_COMMUNITY): Payer: Self-pay

## 2016-07-20 ENCOUNTER — Telehealth: Payer: Self-pay | Admitting: *Deleted

## 2016-07-20 ENCOUNTER — Ambulatory Visit (HOSPITAL_COMMUNITY)
Admission: RE | Admit: 2016-07-20 | Discharge: 2016-07-20 | Disposition: A | Discharge status: Home / Self Care | Payer: Medicare Other | Source: Ambulatory Visit | Attending: Internal Medicine | Admitting: Internal Medicine

## 2016-07-20 DIAGNOSIS — C7951 Secondary malignant neoplasm of bone: Secondary | ICD-10-CM | POA: Insufficient documentation

## 2016-07-20 DIAGNOSIS — Z8659 Personal history of other mental and behavioral disorders: Secondary | ICD-10-CM | POA: Insufficient documentation

## 2016-07-20 DIAGNOSIS — I10 Essential (primary) hypertension: Secondary | ICD-10-CM | POA: Insufficient documentation

## 2016-07-20 DIAGNOSIS — F419 Anxiety disorder, unspecified: Secondary | ICD-10-CM | POA: Diagnosis not present

## 2016-07-20 DIAGNOSIS — C3490 Malignant neoplasm of unspecified part of unspecified bronchus or lung: Secondary | ICD-10-CM

## 2016-07-20 DIAGNOSIS — R911 Solitary pulmonary nodule: Secondary | ICD-10-CM | POA: Diagnosis not present

## 2016-07-20 DIAGNOSIS — Z5111 Encounter for antineoplastic chemotherapy: Secondary | ICD-10-CM

## 2016-07-20 DIAGNOSIS — K5730 Diverticulosis of large intestine without perforation or abscess without bleeding: Secondary | ICD-10-CM | POA: Insufficient documentation

## 2016-07-20 DIAGNOSIS — C3491 Malignant neoplasm of unspecified part of right bronchus or lung: Secondary | ICD-10-CM | POA: Insufficient documentation

## 2016-07-20 MED ORDER — IOPAMIDOL (ISOVUE-300) INJECTION 61%
INTRAVENOUS | Status: AC
  Administered 2016-07-20: 100 mL
  Filled 2016-07-20: qty 100

## 2016-07-20 MED ORDER — OXYCODONE HCL 10 MG PO TABS: 10.0000 mg | ORAL_TABLET | Freq: Four times a day (QID) | ORAL | 0 refills | Status: DC | PRN

## 2016-07-20 NOTE — Telephone Encounter (Signed)
PROCHLORPERAZINE '10MG'$  TABLETS  Okay to fill?

## 2016-07-20 NOTE — Telephone Encounter (Signed)
Pt called with request for refill on oxycodone. Pt will pick up on 5/3.

## 2016-07-20 NOTE — Telephone Encounter (Signed)
We have not refilled this.  Has she gotten through Oncology?

## 2016-07-21 ENCOUNTER — Telehealth: Payer: Self-pay

## 2016-07-21 NOTE — Telephone Encounter (Signed)
Refill OK

## 2016-07-21 NOTE — Telephone Encounter (Signed)
Received PA request for Mometasone 50 mcg nasal spray. Pa submitted & is pending. Key: [facility]

## 2016-07-22 MED FILL — oxyCODONE HCL 10 MG TABS: 10 | 22 days supply | Qty: 90 | Fill #0

## 2016-07-22 NOTE — Telephone Encounter (Signed)
Insurance prefers: Azelastine, fluticasone.

## 2016-07-23 ENCOUNTER — Telehealth: Payer: Self-pay | Admitting: Family Medicine

## 2016-07-23 MED ORDER — PREDNISONE 10 MG PO TABS: ORAL_TABLET | ORAL | 0 refills | Status: DC

## 2016-07-23 NOTE — Telephone Encounter (Signed)
Rx sent. Left message on machine for patient.

## 2016-07-23 NOTE — Telephone Encounter (Signed)
Fluticasone nasal spray 2 sprays per nostril once daily.  This is OTC-

## 2016-07-23 NOTE — Telephone Encounter (Signed)
Spoke with patient and she will try saline solution.

## 2016-07-23 NOTE — Telephone Encounter (Signed)
Prednisone 10 mg taper as follows:4-4-4-3-3-2-2   #22

## 2016-07-23 NOTE — Telephone Encounter (Signed)
° °  Pt call to ask if Dr Elease Hashimoto would call her in some prednisome for coughing anf wheezing  said the doctor know she has allergies  She said she also had her CT scan    [place]

## 2016-07-29 ENCOUNTER — Ambulatory Visit: Payer: Medicare Other

## 2016-07-29 ENCOUNTER — Ambulatory Visit (HOSPITAL_BASED_OUTPATIENT_CLINIC_OR_DEPARTMENT_OTHER): Payer: PRIVATE HEALTH INSURANCE

## 2016-07-29 ENCOUNTER — Other Ambulatory Visit: Payer: Medicare Other

## 2016-07-29 DIAGNOSIS — C7951 Secondary malignant neoplasm of bone: Secondary | ICD-10-CM

## 2016-07-29 DIAGNOSIS — C3491 Malignant neoplasm of unspecified part of right bronchus or lung: Secondary | ICD-10-CM

## 2016-07-29 MED ORDER — DENOSUMAB 120 MG/1.7ML ~~LOC~~ SOLN
120.0000 mg | Freq: Once | SUBCUTANEOUS | Status: AC
  Administered 2016-07-29: 120 mg via SUBCUTANEOUS
  Filled 2016-07-29: qty 1.7

## 2016-07-29 NOTE — Patient Instructions (Signed)
Denosumab injection  What is this medicine?  DENOSUMAB (den oh sue mab) slows bone breakdown. Prolia is used to treat osteoporosis in women after menopause and in men. Xgeva is used to prevent bone fractures and other bone problems caused by cancer bone metastases. Xgeva is also used to treat giant cell tumor of the bone.  This medicine may be used for other purposes; ask your health care provider or pharmacist if you have questions.  What should I tell my health care provider before I take this medicine?  They need to know if you have any of these conditions:  -dental disease  -eczema  -infection or history of infections  -kidney disease or on dialysis  -low blood calcium or vitamin D  -malabsorption syndrome  -scheduled to have surgery or tooth extraction  -taking medicine that contains denosumab  -thyroid or parathyroid disease  -an unusual reaction to denosumab, other medicines, foods, dyes, or preservatives  -pregnant or trying to get pregnant  -breast-feeding  How should I use this medicine?  This medicine is for injection under the skin. It is given by a health care professional in a hospital or clinic setting.  If you are getting Prolia, a special MedGuide will be given to you by the pharmacist with each prescription and refill. Be sure to read this information carefully each time.  For Prolia, talk to your pediatrician regarding the use of this medicine in children. Special care may be needed. For Xgeva, talk to your pediatrician regarding the use of this medicine in children. While this drug may be prescribed for children as young as 13 years for selected conditions, precautions do apply.  Overdosage: If you think you have taken too much of this medicine contact a poison control center or emergency room at once.  NOTE: This medicine is only for you. Do not share this medicine with others.  What if I miss a dose?  It is important not to miss your dose. Call your doctor or health care professional if you are  unable to keep an appointment.  What may interact with this medicine?  Do not take this medicine with any of the following medications:  -other medicines containing denosumab  This medicine may also interact with the following medications:  -medicines that suppress the immune system  -medicines that treat cancer  -steroid medicines like prednisone or cortisone  This list may not describe all possible interactions. Give your health care provider a list of all the medicines, herbs, non-prescription drugs, or dietary supplements you use. Also tell them if you smoke, drink alcohol, or use illegal drugs. Some items may interact with your medicine.  What should I watch for while using this medicine?  Visit your doctor or health care professional for regular checks on your progress. Your doctor or health care professional may order blood tests and other tests to see how you are doing.  Call your doctor or health care professional if you get a cold or other infection while receiving this medicine. Do not treat yourself. This medicine may decrease your body's ability to fight infection.  You should make sure you get enough calcium and vitamin D while you are taking this medicine, unless your doctor tells you not to. Discuss the foods you eat and the vitamins you take with your health care professional.  See your dentist regularly. Brush and floss your teeth as directed. Before you have any dental work done, tell your dentist you are receiving this medicine.  Do   not become pregnant while taking this medicine or for 5 months after stopping it. Women should inform their doctor if they wish to become pregnant or think they might be pregnant. There is a potential for serious side effects to an unborn child. Talk to your health care professional or pharmacist for more information.  What side effects may I notice from receiving this medicine?  Side effects that you should report to your doctor or health care professional as soon as  possible:  -allergic reactions like skin rash, itching or hives, swelling of the face, lips, or tongue  -breathing problems  -chest pain  -fast, irregular heartbeat  -feeling faint or lightheaded, falls  -fever, chills, or any other sign of infection  -muscle spasms, tightening, or twitches  -numbness or tingling  -skin blisters or bumps, or is dry, peels, or red  -slow healing or unexplained pain in the mouth or jaw  -unusual bleeding or bruising  Side effects that usually do not require medical attention (Report these to your doctor or health care professional if they continue or are bothersome.):  -muscle pain  -stomach upset, gas  This list may not describe all possible side effects. Call your doctor for medical advice about side effects. You may report side effects to FDA at 1-800-FDA-1088.  Where should I keep my medicine?  This medicine is only given in a clinic, doctor's office, or other health care setting and will not be stored at home.  NOTE: This sheet is a summary. It may not cover all possible information. If you have questions about this medicine, talk to your doctor, pharmacist, or health care provider.      2016, Elsevier/Gold Standard. (2011-09-06 12:37:47)

## 2016-08-02 ENCOUNTER — Other Ambulatory Visit: Payer: Self-pay | Admitting: Internal Medicine

## 2016-08-02 ENCOUNTER — Other Ambulatory Visit: Payer: Self-pay | Admitting: Family Medicine

## 2016-08-02 DIAGNOSIS — C3491 Malignant neoplasm of unspecified part of right bronchus or lung: Secondary | ICD-10-CM

## 2016-08-06 ENCOUNTER — Other Ambulatory Visit: Payer: Self-pay | Admitting: Medical Oncology

## 2016-08-06 DIAGNOSIS — C3490 Malignant neoplasm of unspecified part of unspecified bronchus or lung: Secondary | ICD-10-CM

## 2016-08-06 MED ORDER — ERLOTINIB HCL 150 MG PO TABS: 150.0000 mg | ORAL_TABLET | Freq: Every day | ORAL | 0 refills | Status: DC

## 2016-08-09 ENCOUNTER — Other Ambulatory Visit: Payer: Medicare Other

## 2016-08-11 ENCOUNTER — Ambulatory Visit (HOSPITAL_BASED_OUTPATIENT_CLINIC_OR_DEPARTMENT_OTHER): Payer: Medicare Other | Admitting: Internal Medicine

## 2016-08-11 ENCOUNTER — Other Ambulatory Visit (HOSPITAL_BASED_OUTPATIENT_CLINIC_OR_DEPARTMENT_OTHER): Payer: PRIVATE HEALTH INSURANCE

## 2016-08-11 ENCOUNTER — Encounter: Payer: Self-pay | Admitting: Internal Medicine

## 2016-08-11 DIAGNOSIS — C3490 Malignant neoplasm of unspecified part of unspecified bronchus or lung: Secondary | ICD-10-CM | POA: Diagnosis not present

## 2016-08-11 DIAGNOSIS — I10 Essential (primary) hypertension: Secondary | ICD-10-CM

## 2016-08-11 DIAGNOSIS — F419 Anxiety disorder, unspecified: Secondary | ICD-10-CM

## 2016-08-11 DIAGNOSIS — C7951 Secondary malignant neoplasm of bone: Secondary | ICD-10-CM

## 2016-08-11 DIAGNOSIS — Z5111 Encounter for antineoplastic chemotherapy: Secondary | ICD-10-CM

## 2016-08-11 DIAGNOSIS — Z8659 Personal history of other mental and behavioral disorders: Secondary | ICD-10-CM

## 2016-08-11 DIAGNOSIS — C3491 Malignant neoplasm of unspecified part of right bronchus or lung: Secondary | ICD-10-CM | POA: Diagnosis not present

## 2016-08-11 LAB — COMPREHENSIVE METABOLIC PANEL
ALT: 22 U/L (ref 0–55)
AST: 25 U/L (ref 5–34)
Albumin: 3.5 g/dL (ref 3.5–5.0)
Alkaline Phosphatase: 84 U/L (ref 40–150)
Anion Gap: 10 mEq/L (ref 3–11)
BUN: 12 mg/dL (ref 7.0–26.0)
CHLORIDE: 104 meq/L (ref 98–109)
CO2: 30 meq/L — AB (ref 22–29)
CREATININE: 0.8 mg/dL (ref 0.6–1.1)
Calcium: 9.1 mg/dL (ref 8.4–10.4)
EGFR: >90 mL/min/{1.73_m2} (ref >90)
Glucose: 87 mg/dl (ref 70–140)
Potassium: 3.6 mEq/L (ref 3.5–5.1)
Sodium: 144 mEq/L (ref 136–145)
Total Bilirubin: 0.86 mg/dL (ref 0.20–1.20)
Total Protein: 7.2 g/dL (ref 6.4–8.3)

## 2016-08-11 LAB — CBC WITH DIFFERENTIAL/PLATELET
BASO%: 0.2 % (ref 0.0–2.0)
Basophils Absolute: 0 10*3/uL (ref 0.0–0.1)
EOS%: 2.9 % (ref 0.0–7.0)
Eosinophils Absolute: 0.2 10*3/uL (ref 0.0–0.5)
HEMATOCRIT: 35.9 % (ref 34.8–46.6)
HEMOGLOBIN: 11.4 g/dL — AB (ref 11.6–15.9)
LYMPH#: 2.2 10*3/uL (ref 0.9–3.3)
LYMPH%: 34.5 % (ref 14.0–49.7)
MCH: 30.8 pg (ref 25.1–34.0)
MCHC: 31.8 g/dL (ref 31.5–36.0)
MCV: 97 fL (ref 79.5–101.0)
MONO#: 0.5 10*3/uL (ref 0.1–0.9)
MONO%: 8 % (ref 0.0–14.0)
NEUT#: 3.5 10*3/uL (ref 1.5–6.5)
NEUT%: 54.4 % (ref 38.4–76.8)
Platelets: 225 10*3/uL (ref 145–400)
RBC: 3.7 10*6/uL (ref 3.70–5.45)
RDW: 15.1 % — AB (ref 11.2–14.5)
WBC: 6.5 10*3/uL (ref 3.9–10.3)

## 2016-08-11 MED ORDER — OXYCODONE HCL 10 MG PO TABS: 10.0000 mg | ORAL_TABLET | Freq: Four times a day (QID) | ORAL | 0 refills | Status: DC | PRN

## 2016-08-11 MED FILL — oxyCODONE HCL 10 MG TABS: 10 | 22 days supply | Qty: 90 | Fill #0

## 2016-08-11 NOTE — Progress Notes (Signed)
[place] [phone]   [phone]  OFFICE PROGRESS NOTE  Beth Post, MD [place] [address]  DIAGNOSIS: Metastatic non-small cell lung cancer, adenocarcinoma with positive EGFR mutation in exon 19 diagnosed in June 2013  PRIOR THERAPY: Palliative radiotherapy to the right lung under the care of Dr. Valere Dross completed on 10/29/2011.  CURRENT THERAPY: 1) [name] 150 mg by mouth daily started 10/11/2011, status [name] 57 months of treatment. 2) Xgeva 120 mg subcutaneously every 8 weeks.  INTERVAL HISTORY: Beth [name] 56 y.o. female returns to the clinic today for follow-up visit. The patient is feeling fine today was no specific complaints except for mild fatigue and more hair loss. She is tolerating her current treatment with [name] fairly well with no significant adverse effects. She denied having any skin rash or diarrhea. She has intermittent nausea in the morning. She denied having any fever or chills. She has no chest pain, shortness of breath, cough or hemoptysis. She has no weight loss or night sweats. She had repeat CT scan of the chest, abdomen and pelvis weeks ago and she is here for evaluation and discussion of her scan results and treatment options.   MEDICAL HISTORY: Past Medical History:  Diagnosis Date  . Anxiety   . Arthritis of knee, degenerative   . Asthma   . Complication of anesthesia    difficulty waking up  . Depression   . Endocarditis    as teenager  . Fibromyalgia   . Flu-like symptoms 05/12/2016  . GERD (gastroesophageal reflux disease)   . Heart murmur   . History of radiation therapy 09/29/11-11/04/2011   right lung 2700cGy 15 sessions  . Hypertension   . Lung mass    R- ADENOCARCINOMA  . Osseous metastasis ([place]) 09/20/11   per PET scan  . Pleural effusion 08/30/11  . Shortness of breath   . Sleep apnea    no longer using CPAP    ALLERGIES:  is allergic to  meloxicam.  MEDICATIONS:  Current Outpatient Prescriptions  Medication Sig Dispense Refill  . albuterol-ipratropium (COMBIVENT) 18-103 MCG/ACT inhaler Inhale 1 puff into the lungs every 6 (six) hours as needed for wheezing or shortness of breath.    Marland Kitchen amLODipine (NORVASC) 5 MG tablet TAKE 1 TABLET BY MOUTH EVERY DAY 90 tablet 0  . COMBIVENT RESPIMAT 20-100 MCG/ACT AERS respimat INHALE 1 PUFF INTO THE LUNGS EVERY 6 HOURS AS NEEDED FOR WHEEZING 4 g 3  . cyclobenzaprine (FLEXERIL) 10 MG tablet TAKE 1 TABLET(10 MG) BY MOUTH THREE TIMES DAILY AS NEEDED FOR MUSCLE SPASMS 30 tablet 0  . ergocalciferol (VITAMIN D2) 50000 units capsule Take 1 capsule (50,000 Units total) by mouth once a week. 12 capsule 3  . erlotinib ([name]) 150 MG tablet Take 1 tablet (150 mg total) by mouth daily. Take on an empty stomach 1 hour before meals or 2 hours after. 30 tablet 0  . famotidine (PEPCID) 20 MG tablet One after bfast and after supper 60 tablet 2  . fish oil-omega-3 fatty acids 1000 MG capsule Take 2 g by mouth daily.    . furosemide (LASIX) 20 MG tablet TAKE 1 TABLET BY MOUTH DAILY AS NEEDED FOR SEVERE EDEMA 30 tablet 0  . hydrochlorothiazide (HYDRODIURIL) 25 MG tablet TAKE 1 TABLET BY MOUTH EVERY DAY 90 tablet 2  . ibuprofen (ADVIL,MOTRIN) 800 MG tablet TAKE 1 TABLET(800 MG) BY MOUTH DAILY AS NEEDED 30 tablet 0  . loperamide (  IMODIUM) 2 MG capsule Take 2 mg by mouth 4 (four) times daily as needed for diarrhea or loose stools.    . mirtazapine (REMERON) 30 MG tablet Take 1 tablet (30 mg total) by mouth at bedtime. 30 tablet 2  . mometasone (NASONEX) 50 MCG/ACT nasal spray Place 2 sprays into the nose daily. 17 g 12  . Multiple Vitamins-Minerals (MULTIVITAMIN WITH MINERALS) tablet Take 1 tablet by mouth daily.    . mupirocin ointment (BACTROBAN) 2 % APPLY INTO EACH NOSTRIL TWICE DAILY 22 g 0  . NONFORMULARY OR COMPOUNDED ITEM Cbcd, cmp,ua   Dx hx fever, hx endocarditis, 1 each 0  . Oxycodone HCl 10 MG TABS Take  1 tablet (10 mg total) by mouth every 6 (six) hours as needed. 90 tablet 0  . PREMARIN vaginal cream Place 1 application vaginally 2 (two) times daily.  5  . Probiotic Product (ALIGN PO) Take 1 capsule by mouth daily.    . temazepam (RESTORIL) 15 MG capsule Take 1 capsule (15 mg total) by mouth at bedtime as needed for sleep. 30 capsule 0  . diphenhydrAMINE (BENADRYL) 25 MG tablet Take 25 mg by mouth every 6 (six) hours as needed.    . diphenoxylate-atropine (LOMOTIL) 2.5-0.025 MG tablet TAKE 2 TABLETS BY MOUTH FOUR TIMES DAILY AS NEEDED FOR DIARRHEA OR LOOSE STOOLS. ALTERNATE WITH IMMODIUM AFTER EACH STOOL. MAX OF 8 PER DAY (Patient not taking: Reported on 08/11/2016) 30 tablet 0  . predniSONE (DELTASONE) 10 MG tablet taper as follows:4-4-4-3-3-2-2 22 tablet 0  . prochlorperazine (COMPAZINE) 10 MG tablet TAKE ONE TABLET BY MOUTH EVERY 6 HOURS AS NEEDED (Patient not taking: Reported on 08/11/2016) 385 tablet 0   No current facility-administered medications for this visit.     SURGICAL HISTORY:  Past Surgical History:  Procedure Laterality Date  . THORACENTESIS  09/02/11, 09/09/11   right-side pleural effusion  . TUBAL LIGATION  2002    REVIEW OF SYSTEMS:  Constitutional: positive for fatigue Eyes: negative Ears, nose, mouth, throat, and face: negative Respiratory: positive for dyspnea on exertion Cardiovascular: negative Gastrointestinal: positive for nausea Genitourinary:negative Integument/breast: negative Hematologic/lymphatic: negative Musculoskeletal:positive for back pain Neurological: negative Behavioral/Psych: negative Endocrine: negative Allergic/Immunologic: negative   PHYSICAL EXAMINATION: General appearance: alert, cooperative, fatigued and no distress Head: Normocephalic, without obvious abnormality, atraumatic Neck: no adenopathy, no JVD, supple, symmetrical, trachea midline and thyroid not enlarged, symmetric, no tenderness/mass/nodules Lymph nodes: Cervical,  supraclavicular, and axillary nodes normal. Resp: clear to auscultation bilaterally Back: symmetric, no curvature. ROM normal. No CVA tenderness. Cardio: regular rate and rhythm, S1, S2 normal, no murmur, click, rub or gallop GI: soft, non-tender; bowel sounds normal; no masses,  no organomegaly Extremities: extremities normal, atraumatic, no cyanosis or edema Neurologic: Alert and oriented X 3, normal strength and tone. Normal symmetric reflexes. Normal coordination and gait  ECOG PERFORMANCE STATUS: 1 - Symptomatic but completely ambulatory  Blood pressure 134/76, pulse (!) 107, temperature 98.4 F (36.9 C), temperature source Oral, resp. rate 20, height 5' 7" (1.702 m), weight 275 lb 3.2 oz (124.8 kg), last menstrual period 09/28/2011, SpO2 96 %.  LABORATORY DATA: Lab Results  Component Value Date   WBC 6.5 08/11/2016   HGB 11.4 (L) 08/11/2016   HCT 35.9 08/11/2016   MCV 97.0 08/11/2016   PLT 225 08/11/2016      Chemistry      Component Value Date/Time   NA 146 (H) 07/12/2016 1021   K 3.5 07/12/2016 1021   CL 100 03/01/2016 1110  CL 104 08/18/2012 1154   CO2 31 (H) 07/12/2016 1021   BUN 11.3 07/12/2016 1021   CREATININE 0.8 07/12/2016 1021      Component Value Date/Time   CALCIUM 9.3 07/12/2016 1021   ALKPHOS 85 07/12/2016 1021   AST 32 07/12/2016 1021   ALT 26 07/12/2016 1021   BILITOT 0.60 07/12/2016 1021       RADIOGRAPHIC STUDIES: Ct Chest W Contrast  Result Date: 07/20/2016 CLINICAL DATA:  Lung cancer diagnosed 2013. Oral chemotherapy in progress. Radiation therapy complete. EXAM: CT CHEST, ABDOMEN, AND PELVIS WITH CONTRAST TECHNIQUE: Multidetector CT imaging of the chest, abdomen and pelvis was performed following the standard protocol during bolus administration of intravenous contrast. CONTRAST:  169m ISOVUE-300 IOPAMIDOL (ISOVUE-300) INJECTION 61% COMPARISON:  CT 12/16/2015. FINDINGS: CT CHEST FINDINGS Cardiovascular: No significant vascular findings.  Normal heart size. No pericardial effusion. Mediastinum/Nodes: No axillary or supraclavicular adenopathy. No mediastinal hilar adenopathy. No pericardial fluid. Esophagus normal. Lungs/Pleura: Volume loss in the RIGHT hemithorax. Partially calcified nodular density in the RIGHT lower lobe measures 21 mm unchanged 21 mm on prior (image 33, series 2). There is enlarged anterior hernia on the RIGHT which contains loops of small bowel and colon. No interval change. LEFT lung is expanded. Ground-glass opacity in the LEFT upper lobe on image 42, series 4 has increased mildly in conspicuity from recent comparison exams measuring 18 mm in greatest dimension. Musculoskeletal: Widespread sclerotic metastasis which are new in confluent. No change CT ABDOMEN AND PELVIS FINDINGS Hepatobiliary: No focal hepatic lesion. No biliary ductal dilatation. Gallbladder is normal. Common bile duct is normal. Pancreas: Pancreas is normal. No ductal dilatation. No pancreatic inflammation. Spleen: Normal spleen Adrenals/urinary tract: Adrenal glands and kidneys are normal. The ureters and bladder normal. Stomach/Bowel: Diverticular the descending colon. No bowel obstruction. Vascular/Lymphatic: Abdominal aorta is normal caliber. There is no retroperitoneal or periportal lymphadenopathy. No pelvic lymphadenopathy. Reproductive: Uterus and ovaries normal Other: No peritoneal metastasis.  No free fluid Musculoskeletal: Widespread osseous sclerotic metastasis which confluent in the pelvis and spine IMPRESSION: Chest Impression: 1. LEFT upper lobe ground-glass nodule is increased in conspicuity over recent exams. Subtle finding. Recommend follow-up CT in 3 to 6 months. 2. Stable partially calcified nodule at the RIGHT lung base. 3. Volume loss in the RIGHT hemithorax associated with hernia unchanged. 4. No evidence of lung cancer recurrence. Abdomen / Pelvis Impression: 1. No evidence of metastatic disease in the abdomen pelvis. 2. Mild colonic  diverticulosis. 3. Widespread diffuse sclerotic skeletal metastasis unchanged from prior. Electronically Signed   By: SSuzy BouchardM.D.   On: 07/20/2016 14:25   Ct Abdomen Pelvis W Contrast  Result Date: 07/20/2016 CLINICAL DATA:  Lung cancer diagnosed 2013. Oral chemotherapy in progress. Radiation therapy complete. EXAM: CT CHEST, ABDOMEN, AND PELVIS WITH CONTRAST TECHNIQUE: Multidetector CT imaging of the chest, abdomen and pelvis was performed following the standard protocol during bolus administration of intravenous contrast. CONTRAST:  1049mISOVUE-300 IOPAMIDOL (ISOVUE-300) INJECTION 61% COMPARISON:  CT 12/16/2015. FINDINGS: CT CHEST FINDINGS Cardiovascular: No significant vascular findings. Normal heart size. No pericardial effusion. Mediastinum/Nodes: No axillary or supraclavicular adenopathy. No mediastinal hilar adenopathy. No pericardial fluid. Esophagus normal. Lungs/Pleura: Volume loss in the RIGHT hemithorax. Partially calcified nodular density in the RIGHT lower lobe measures 21 mm unchanged 21 mm on prior (image 33, series 2). There is enlarged anterior hernia on the RIGHT which contains loops of small bowel and colon. No interval change. LEFT lung is expanded. Ground-glass opacity in the LEFT upper  lobe on image 42, series 4 has increased mildly in conspicuity from recent comparison exams measuring 18 mm in greatest dimension. Musculoskeletal: Widespread sclerotic metastasis which are new in confluent. No change CT ABDOMEN AND PELVIS FINDINGS Hepatobiliary: No focal hepatic lesion. No biliary ductal dilatation. Gallbladder is normal. Common bile duct is normal. Pancreas: Pancreas is normal. No ductal dilatation. No pancreatic inflammation. Spleen: Normal spleen Adrenals/urinary tract: Adrenal glands and kidneys are normal. The ureters and bladder normal. Stomach/Bowel: Diverticular the descending colon. No bowel obstruction. Vascular/Lymphatic: Abdominal aorta is normal caliber. There is no  retroperitoneal or periportal lymphadenopathy. No pelvic lymphadenopathy. Reproductive: Uterus and ovaries normal Other: No peritoneal metastasis.  No free fluid Musculoskeletal: Widespread osseous sclerotic metastasis which confluent in the pelvis and spine IMPRESSION: Chest Impression: 1. LEFT upper lobe ground-glass nodule is increased in conspicuity over recent exams. Subtle finding. Recommend follow-up CT in 3 to 6 months. 2. Stable partially calcified nodule at the RIGHT lung base. 3. Volume loss in the RIGHT hemithorax associated with hernia unchanged. 4. No evidence of lung cancer recurrence. Abdomen / Pelvis Impression: 1. No evidence of metastatic disease in the abdomen pelvis. 2. Mild colonic diverticulosis. 3. Widespread diffuse sclerotic skeletal metastasis unchanged from prior. Electronically Signed   By: Suzy Bouchard M.D.   On: 07/20/2016 14:25    ASSESSMENT AND PLAN:  This is a very pleasant 56 years old African-American female with a stage IV non-small cell lung cancer with positive EGFR mutation in exon 37 diagnosed in June 2013. She status [name] palliative radiotherapy to the right lung mass and she has been on treatment with [name] 150 mg by mouth daily status [name] 57 months. The patient is tolerating her treatment well with no significant adverse effects. She had repeat CT scan of the chest, abdomen and pelvis performed earlier this month. I personally and independently reviewed the scan images and discuss the results and showed the images to the patient today. Her scan showed no clear evidence for disease progression but there was prominent left upper lobe ground glass nodule. I recommended for the patient to continue her current treatment with [name] with the same dose and we will continue to monitor this groundglass opacity closely on upcoming imaging studies. For the metastatic bone disease, the patient will continue treatment with Xgeva every 8 weeks. For the chronic back  pain, I gave her refill for oxycodone. The patient was advised to call immediately if she has any concerning symptoms in the interval. The patient voices understanding of current disease status and treatment options and is in agreement with the current care plan. All questions were answered. The patient knows to call the clinic with any problems, questions or concerns. We can certainly see the patient much sooner if necessary.  Disclaimer: This note was dictated with voice recognition software. Similar sounding words can inadvertently be transcribed and may not be corrected upon review.

## 2016-08-12 ENCOUNTER — Telehealth: Payer: Self-pay | Admitting: Internal Medicine

## 2016-08-12 NOTE — Telephone Encounter (Signed)
Scheduled appt per 5/23 LOS - left message with appt date and time and sent reminder letter in the mail.

## 2016-08-13 ENCOUNTER — Other Ambulatory Visit: Payer: Self-pay | Admitting: Medical Oncology

## 2016-08-13 ENCOUNTER — Telehealth: Payer: Self-pay | Admitting: Medical Oncology

## 2016-08-13 DIAGNOSIS — C3490 Malignant neoplasm of unspecified part of unspecified bronchus or lung: Secondary | ICD-10-CM

## 2016-08-13 MED ORDER — HYDROCODONE-HOMATROPINE 5-1.5 MG/5ML PO SYRP: 5.0000 mL | ORAL_SOLUTION | Freq: Four times a day (QID) | ORAL | Status: DC | PRN

## 2016-08-13 MED ORDER — HYDROCODONE-HOMATROPINE 5-1.5 MG/5ML PO SYRP: 5.0000 mL | ORAL_SOLUTION | Freq: Four times a day (QID) | ORAL | 0 refills | Status: DC | PRN

## 2016-08-13 NOTE — Addendum Note (Signed)
Addended by: Ardeen Garland on: 08/13/2016 12:30 PM   Modules accepted: Orders

## 2016-08-13 NOTE — Telephone Encounter (Signed)
coughing and requests something besides OTC which does not work for her.

## 2016-08-13 NOTE — Telephone Encounter (Signed)
I left message to call back with what OTC cough medication is not working.

## 2016-08-16 ENCOUNTER — Other Ambulatory Visit: Payer: Self-pay | Admitting: Internal Medicine

## 2016-08-16 DIAGNOSIS — R197 Diarrhea, unspecified: Secondary | ICD-10-CM

## 2016-08-18 ENCOUNTER — Other Ambulatory Visit: Payer: Self-pay | Admitting: Medical Oncology

## 2016-08-19 ENCOUNTER — Telehealth: Payer: Self-pay | Admitting: *Deleted

## 2016-08-19 DIAGNOSIS — R197 Diarrhea, unspecified: Secondary | ICD-10-CM

## 2016-08-19 MED ORDER — DIPHENOXYLATE-ATROPINE 2.5-0.025 MG PO TABS: ORAL_TABLET | ORAL | 0 refills | Status: DC

## 2016-08-19 NOTE — Telephone Encounter (Signed)
Pt called regarding medication for diarrhea. Rx for Lomotil called into pt pharmacy sent in by MD on 5/28.  Called pharmacy verified rx received. Verbal order for Lomotil called in.

## 2016-08-26 ENCOUNTER — Other Ambulatory Visit: Payer: Self-pay | Admitting: Family Medicine

## 2016-08-27 ENCOUNTER — Telehealth: Payer: Self-pay | Admitting: Medical Oncology

## 2016-08-27 NOTE — Telephone Encounter (Signed)
Returned cal. Pt reports the mirtazepiine 30 mg is not effective anymore. Asking for stronger dose.

## 2016-08-28 NOTE — Telephone Encounter (Signed)
We can go up to 45 mg qhs or wait until I see her next visit.

## 2016-08-31 ENCOUNTER — Telehealth: Payer: Self-pay | Admitting: Medical Oncology

## 2016-08-31 NOTE — Telephone Encounter (Signed)
I told pt Dr Julien Nordmann can increase dose or she can wait until she sees him at next visit. She said she will wait. She said it does work but it is taking longer to put her to sleep.

## 2016-09-02 ENCOUNTER — Other Ambulatory Visit: Payer: Self-pay | Admitting: Emergency Medicine

## 2016-09-02 DIAGNOSIS — Z5111 Encounter for antineoplastic chemotherapy: Secondary | ICD-10-CM

## 2016-09-02 DIAGNOSIS — C3490 Malignant neoplasm of unspecified part of unspecified bronchus or lung: Secondary | ICD-10-CM

## 2016-09-02 MED ORDER — OXYCODONE HCL 10 MG PO TABS: 10.0000 mg | ORAL_TABLET | Freq: Four times a day (QID) | ORAL | 0 refills | Status: DC | PRN

## 2016-09-04 ENCOUNTER — Other Ambulatory Visit: Payer: Self-pay | Admitting: Internal Medicine

## 2016-09-04 DIAGNOSIS — C3490 Malignant neoplasm of unspecified part of unspecified bronchus or lung: Secondary | ICD-10-CM

## 2016-09-06 ENCOUNTER — Ambulatory Visit (HOSPITAL_BASED_OUTPATIENT_CLINIC_OR_DEPARTMENT_OTHER): Payer: PRIVATE HEALTH INSURANCE

## 2016-09-06 ENCOUNTER — Other Ambulatory Visit (HOSPITAL_BASED_OUTPATIENT_CLINIC_OR_DEPARTMENT_OTHER): Payer: PRIVATE HEALTH INSURANCE

## 2016-09-06 VITALS — BP 128/57 | HR 100 | Temp 98.2°F | Resp 20

## 2016-09-06 DIAGNOSIS — C3491 Malignant neoplasm of unspecified part of right bronchus or lung: Secondary | ICD-10-CM

## 2016-09-06 DIAGNOSIS — C3490 Malignant neoplasm of unspecified part of unspecified bronchus or lung: Secondary | ICD-10-CM | POA: Diagnosis not present

## 2016-09-06 DIAGNOSIS — C7951 Secondary malignant neoplasm of bone: Secondary | ICD-10-CM | POA: Diagnosis not present

## 2016-09-06 DIAGNOSIS — Z5111 Encounter for antineoplastic chemotherapy: Secondary | ICD-10-CM

## 2016-09-06 LAB — CBC WITH DIFFERENTIAL/PLATELET
BASO%: 0.8 % (ref 0.0–2.0)
Basophils Absolute: 0 10*3/uL (ref 0.0–0.1)
EOS%: 3.7 % (ref 0.0–7.0)
Eosinophils Absolute: 0.2 10*3/uL (ref 0.0–0.5)
HCT: 36.9 % (ref 34.8–46.6)
HGB: 12.2 g/dL (ref 11.6–15.9)
LYMPH%: 37.8 % (ref 14.0–49.7)
MCH: 31.1 pg (ref 25.1–34.0)
MCHC: 32.9 g/dL (ref 31.5–36.0)
MCV: 94.4 fL (ref 79.5–101.0)
MONO#: 0.5 10*3/uL (ref 0.1–0.9)
MONO%: 9.8 % (ref 0.0–14.0)
NEUT%: 47.9 % (ref 38.4–76.8)
NEUTROS ABS: 2.6 10*3/uL (ref 1.5–6.5)
PLATELETS: 257 10*3/uL (ref 145–400)
RBC: 3.91 10*6/uL (ref 3.70–5.45)
RDW: 15.1 % — ABNORMAL HIGH (ref 11.2–14.5)
WBC: 5.3 10*3/uL (ref 3.9–10.3)
lymph#: 2 10*3/uL (ref 0.9–3.3)

## 2016-09-06 LAB — COMPREHENSIVE METABOLIC PANEL
ALT: 19 U/L (ref 0–55)
ANION GAP: 6 meq/L (ref 3–11)
AST: 24 U/L (ref 5–34)
Albumin: 3.7 g/dL (ref 3.5–5.0)
Alkaline Phosphatase: 77 U/L (ref 40–150)
BUN: 18.2 mg/dL (ref 7.0–26.0)
CO2: 31 mEq/L — ABNORMAL HIGH (ref 22–29)
CREATININE: 0.8 mg/dL (ref 0.6–1.1)
Calcium: 9.6 mg/dL (ref 8.4–10.4)
Chloride: 105 mEq/L (ref 98–109)
EGFR: >90 mL/min/{1.73_m2} (ref >90)
GLUCOSE: 101 mg/dL (ref 70–140)
Potassium: 4.1 mEq/L (ref 3.5–5.1)
SODIUM: 141 meq/L (ref 136–145)
Total Bilirubin: 0.66 mg/dL (ref 0.20–1.20)
Total Protein: 7.6 g/dL (ref 6.4–8.3)

## 2016-09-06 MED ORDER — DENOSUMAB 120 MG/1.7ML ~~LOC~~ SOLN
120.0000 mg | Freq: Once | SUBCUTANEOUS | Status: AC
  Administered 2016-09-06: 120 mg via SUBCUTANEOUS
  Filled 2016-09-06: qty 1.7

## 2016-09-06 MED FILL — oxyCODONE HCL 10 MG TABS: 10 | 22 days supply | Qty: 90 | Fill #0

## 2016-09-13 ENCOUNTER — Ambulatory Visit: Payer: Medicare Other | Admitting: Family Medicine

## 2016-09-21 ENCOUNTER — Telehealth: Payer: Self-pay | Admitting: *Deleted

## 2016-09-21 NOTE — Telephone Encounter (Signed)
Returned call to pt who states "Im coming in on 7/10, I will just talk with MD when I see him" No further concerns.

## 2016-09-23 ENCOUNTER — Ambulatory Visit (INDEPENDENT_AMBULATORY_CARE_PROVIDER_SITE_OTHER): Payer: Medicare Other | Admitting: Family Medicine

## 2016-09-23 ENCOUNTER — Encounter: Payer: Self-pay | Admitting: Family Medicine

## 2016-09-23 VITALS — BP 118/84 | HR 104 | Temp 98.4°F | Wt 275.0 lb

## 2016-09-23 DIAGNOSIS — L219 Seborrheic dermatitis, unspecified: Secondary | ICD-10-CM | POA: Diagnosis not present

## 2016-09-23 MED ORDER — KETOCONAZOLE 2 % EX SHAM: 1.0000 "application " | MEDICATED_SHAMPOO | CUTANEOUS | 0 refills | Status: DC

## 2016-09-23 MED ORDER — TRIAMCINOLONE ACETONIDE 0.025 % EX CREA: 1.0000 "application " | TOPICAL_CREAM | Freq: Two times a day (BID) | CUTANEOUS | 0 refills | Status: DC

## 2016-09-23 NOTE — Patient Instructions (Signed)
Please use shampoo daily for one week and then decrease to directed. This may be needed once weekly to maintain control long term. You can use cream on problem areas also to improve symptoms.  Leave shampoo on hair 5 to 10 minutes before rinsing.  Follow up with dermatologist as you have scheduled or Dr. Elease Hashimoto sooner if symptoms do not improve with treatment.    Seborrheic Dermatitis, Adult Seborrheic dermatitis is a skin disease that causes red, scaly patches. It usually occurs on the scalp, and it is often called dandruff. The patches may appear on other parts of the body. Skin patches tend to appear where there are many oil glands in the skin. Areas of the body that are commonly affected include:  Scalp.  Skin folds of the body.  Ears.  Eyebrows.  Neck.  Face.  Armpits.  The bearded area of men's faces.  The condition may come and go for no known reason, and it is often long-lasting (chronic). What are the causes? The cause of this condition is not known. What increases the risk? This condition is more likely to develop in people who:  Have certain conditions, such as: ? HIV (human immunodeficiency virus). ? AIDS (acquired immunodeficiency syndrome). ? Parkinson disease. ? Mood disorders, such as depression.  Are 20-45 years old.  What are the signs or symptoms? Symptoms of this condition include:  Thick scales on the scalp.  Redness on the face or in the armpits.  Skin that is flaky. The flakes may be white or yellow.  Skin that seems oily or dry but is not helped with moisturizers.  Itching or burning in the affected areas.  How is this diagnosed? This condition is diagnosed with a medical history and physical exam. A sample of your skin may be tested (skin biopsy). You may need to see a skin specialist (dermatologist). How is this treated? There is no cure for this condition, but treatment can help to manage the symptoms. You may get treatment to  remove scales, lower the risk of skin infection, and reduce swelling or itching. Treatment may include:  Creams that reduce swelling and irritation (steroids).  Creams that reduce skin yeast.  Medicated shampoo, soaps, moisturizing creams, or ointments.  Medicated moisturizing creams or ointments.  Follow these instructions at home:  Apply over-the-counter and prescription medicines only as told by your health care provider.  Use any medicated shampoo, soaps, skin creams, or ointments only as told by your health care provider.  Keep all follow-up visits as told by your health care provider. This is important. Contact a health care provider if:  Your symptoms do not improve with treatment.  Your symptoms get worse.  You have new symptoms. This information is not intended to replace advice given to you by your health care provider. Make sure you discuss any questions you have with your health care provider. Document Released: 03/08/2005 Document Revised: 09/26/2015 Document Reviewed: 06/26/2015 Elsevier Interactive Patient Education  [employer].

## 2016-09-23 NOTE — Progress Notes (Signed)
Subjective:    Patient ID: Beth [name], female    DOB: 1961/03/02, 56 y.o.   MRN: [id number]  HPI  Beth [name] is a 56 year old female who presents today with a "sores" on her scalp that have been present for 2 weeks. Associated itching and burning have been present. She reports that this has been intermittently occuring once/twice yearly for "adult years".  She has a history of trying dandruff shampoo however not on a regular basis. She reports limited benefit from prior use. She also scheduled a dermatology appointment for this concern as this has been chronic in nature however the appointment is one month from now. She denies fever, chills, sweats, N/V/D, other areas with same concern, drainage, or myalgias. No aggravating or alleviating factors noted.  Treatment with Frankey Shown has provided limited benefit. Pertinent history includes stage IV adenocarcinoma lung and is followed monthly by her oncologist. She reports doing well with treatment/   Review of Systems  Constitutional: Negative for chills, fatigue and fever.  HENT:       Sores on scalp  Respiratory: Negative for cough, shortness of breath and wheezing.   Cardiovascular: Negative for chest pain and palpitations.  Gastrointestinal: Negative for abdominal pain, diarrhea, nausea and vomiting.  Musculoskeletal: Negative for myalgias.   Past Medical History:  Diagnosis Date  . Anxiety   . Arthritis of knee, degenerative   . Asthma   . Complication of anesthesia    difficulty waking up  . Depression   . Endocarditis    as teenager  . Fibromyalgia   . Flu-like symptoms 05/12/2016  . GERD (gastroesophageal reflux disease)   . Heart murmur   . History of radiation therapy 09/29/11-11/04/2011   right lung 2700cGy 15 sessions  . Hypertension   . Lung mass    R- ADENOCARCINOMA  . Osseous metastasis ([place]) 09/20/11   per PET scan  . Pleural effusion 08/30/11  . Shortness of breath   . Sleep apnea    no longer using CPAP     Social History   Social History  . Marital status: Single    Spouse name: N/A  . Number of children: N/A  . Years of education: N/A   Occupational History  . Not on file.   Social History Main Topics  . Smoking status: Former Smoker    Packs/day: 0.50    Years: 15.00    Types: Cigarettes    Quit date: 01/01/2011  . Smokeless tobacco: Never Used  . Alcohol use No     Comment: per H&P, used to drink alcohol regularly  . Drug use: No  . Sexual activity: Not Currently     Comment: still having menses due this week, menses started age 16, g1,p1, no hrt, has hot flashes   Other Topics Concern  . Not on file   Social History Narrative   Divorced, 1 son, worked for [employer]    Past Surgical History:  Procedure Laterality Date  . THORACENTESIS  09/02/11, 09/09/11   right-side pleural effusion  . TUBAL LIGATION  2002    Family History  Problem Relation Age of Onset  . Alcohol abuse Father   . Hypertension Mother   . Hypertension Maternal Aunt   . Hypertension Maternal Uncle   . Hypertension Maternal Grandmother   . Hypertension Maternal Grandfather   . Sarcoidosis Sister     Allergies  Allergen Reactions  . Meloxicam     Possible GI bleed    Current  Outpatient Prescriptions on File Prior to Visit  Medication Sig Dispense Refill  . albuterol-ipratropium (COMBIVENT) 18-103 MCG/ACT inhaler Inhale 1 puff into the lungs every 6 (six) hours as needed for wheezing or shortness of breath.    Marland Kitchen amLODipine (NORVASC) 5 MG tablet TAKE 1 TABLET BY MOUTH EVERY DAY 90 tablet 0  . COMBIVENT RESPIMAT 20-100 MCG/ACT AERS respimat INHALE 1 PUFF INTO THE LUNGS EVERY 6 HOURS AS NEEDED FOR WHEEZING 4 g 1  . cyclobenzaprine (FLEXERIL) 10 MG tablet TAKE 1 TABLET(10 MG) BY MOUTH THREE TIMES DAILY AS NEEDED FOR MUSCLE SPASMS 30 tablet 0  . diphenhydrAMINE (BENADRYL) 25 MG tablet Take 25 mg by mouth every 6 (six) hours as needed.    . diphenoxylate-atropine (LOMOTIL) 2.5-0.025 MG tablet TAKE 2  TABLETS BY MOUTH FOUR TIMES DAILY AS NEEDED FOR DIARRHEA OR LOOSE STOOL. ALTERNATE WITH IMMODIUM. MAX OF 8 TABLETS PER DAY 30 tablet 0  . ergocalciferol (VITAMIN D2) 50000 units capsule Take 1 capsule (50,000 Units total) by mouth once a week. 12 capsule 3  . famotidine (PEPCID) 20 MG tablet One after bfast and after supper 60 tablet 2  . fish oil-omega-3 fatty acids 1000 MG capsule Take 2 g by mouth daily.    . furosemide (LASIX) 20 MG tablet TAKE 1 TABLET BY MOUTH DAILY AS NEEDED FOR SEVERE EDEMA 30 tablet 0  . hydrochlorothiazide (HYDRODIURIL) 25 MG tablet TAKE 1 TABLET BY MOUTH EVERY DAY 90 tablet 2  . ibuprofen (ADVIL,MOTRIN) 800 MG tablet TAKE 1 TABLET(800 MG) BY MOUTH DAILY AS NEEDED 30 tablet 0  . loperamide (IMODIUM) 2 MG capsule Take 2 mg by mouth 4 (four) times daily as needed for diarrhea or loose stools.    . mirtazapine (REMERON) 30 MG tablet Take 1 tablet (30 mg total) by mouth at bedtime. 30 tablet 2  . mometasone (NASONEX) 50 MCG/ACT nasal spray Place 2 sprays into the nose daily. 17 g 12  . Multiple Vitamins-Minerals (MULTIVITAMIN WITH MINERALS) tablet Take 1 tablet by mouth daily.    . mupirocin ointment (BACTROBAN) 2 % APPLY INTO EACH NOSTRIL TWICE DAILY 22 g 0  . NONFORMULARY OR COMPOUNDED ITEM Cbcd, cmp,ua   Dx hx fever, hx endocarditis, 1 each 0  . Oxycodone HCl 10 MG TABS Take 1 tablet (10 mg total) by mouth every 6 (six) hours as needed. 90 tablet 0  . predniSONE (DELTASONE) 10 MG tablet taper as follows:4-4-4-3-3-2-2 22 tablet 0  . PREMARIN vaginal cream Place 1 application vaginally 2 (two) times daily.  5  . Probiotic Product (ALIGN PO) Take 1 capsule by mouth daily.    . prochlorperazine (COMPAZINE) 10 MG tablet TAKE ONE TABLET BY MOUTH EVERY 6 HOURS AS NEEDED 385 tablet 0  . [name] 150 MG tablet TAKE 1 TABLET BY MOUTH EVERY DAY ON AN EMPTY STOMACH 1 HOUR BEFORE OR 2 HOURS AFTER A MEAL 30 tablet 0  . temazepam (RESTORIL) 15 MG capsule Take 1 capsule (15 mg total) by  mouth at bedtime as needed for sleep. 30 capsule 0   No current facility-administered medications on file prior to visit.     BP 118/84 (BP Location: Right Arm, Patient Position: Sitting, Cuff Size: Large)   Pulse (!) 104   Temp 98.4 F (36.9 C) (Oral)   Wt 275 lb (124.7 kg)   LMP 09/28/2011   SpO2 96%   BMI 43.07 kg/m        Objective:   Physical Exam  Constitutional: She is  oriented to person, place, and time. She appears well-developed and well-nourished.  HENT:  Right Ear: Tympanic membrane normal.  Left Ear: Tympanic membrane normal.  Nose: No rhinorrhea.  Mouth/Throat: Oropharynx is clear and moist.  Eyes: Pupils are equal, round, and reactive to light. No scleral icterus.  Neck: Neck supple.  Cardiovascular: Normal rate and regular rhythm.   Pulmonary/Chest: Effort normal and breath sounds normal. She has no wheezes. She has no rales.  Lymphadenopathy:    She has no cervical adenopathy.  Neurological: She is alert and oriented to person, place, and time.  Skin: Skin is warm and dry.  Patchy, mildly erythematous plaques with yellow/greasy scales present on scalp  Psychiatric: She has a normal mood and affect. Her behavior is normal. Judgment and thought content normal.       Assessment & Plan:  1. Seborrheic dermatitis of scalp Exam and history support treatment of seborrheic dermatitis; advised use of shampoo and leaving this on for 5 to 10 minutes prior to rinsing. She will use this daily for one week then decrease to twice weekly. Also provided triamcinolone cream to apply to problem areas and we discussed the use of this sparingly as needed. Advised her to keep appointment with dermatologist if needed and follow up with Dr. Elease Hashimoto sooner if symptoms do not improve with treatment. - ketoconazole (NIZORAL) 2 % shampoo; Apply 1 application topically 2 (two) times a week.  Dispense: 120 mL; Refill: 0 - triamcinolone (KENALOG) 0.025 % cream; Apply 1 application  topically 2 (two) times daily.  Dispense: 30 g; Refill: 0  Delano Metz, FNP-C

## 2016-09-25 ENCOUNTER — Other Ambulatory Visit: Payer: Self-pay | Admitting: Internal Medicine

## 2016-09-25 DIAGNOSIS — C3490 Malignant neoplasm of unspecified part of unspecified bronchus or lung: Secondary | ICD-10-CM

## 2016-09-27 ENCOUNTER — Ambulatory Visit: Payer: Medicare Other | Admitting: Internal Medicine

## 2016-09-27 ENCOUNTER — Other Ambulatory Visit: Payer: Medicare Other

## 2016-09-28 ENCOUNTER — Other Ambulatory Visit (HOSPITAL_COMMUNITY)
Admission: RE | Admit: 2016-09-28 | Discharge: 2016-09-28 | Disposition: A | Discharge status: Home / Self Care | Payer: Medicare Other | Source: Other Acute Inpatient Hospital | Attending: Internal Medicine | Admitting: Internal Medicine

## 2016-09-28 ENCOUNTER — Ambulatory Visit (HOSPITAL_BASED_OUTPATIENT_CLINIC_OR_DEPARTMENT_OTHER): Payer: Medicare Other | Admitting: Internal Medicine

## 2016-09-28 ENCOUNTER — Encounter: Payer: Self-pay | Admitting: Internal Medicine

## 2016-09-28 ENCOUNTER — Other Ambulatory Visit (HOSPITAL_BASED_OUTPATIENT_CLINIC_OR_DEPARTMENT_OTHER): Payer: Medicare Other

## 2016-09-28 ENCOUNTER — Telehealth: Payer: Self-pay | Admitting: Internal Medicine

## 2016-09-28 ENCOUNTER — Other Ambulatory Visit: Payer: Self-pay | Admitting: Medical Oncology

## 2016-09-28 VITALS — BP 139/83 | HR 81 | Temp 98.4°F | Resp 20 | Ht 67.0 in | Wt 273.9 lb

## 2016-09-28 DIAGNOSIS — G893 Neoplasm related pain (acute) (chronic): Secondary | ICD-10-CM | POA: Diagnosis not present

## 2016-09-28 DIAGNOSIS — C3491 Malignant neoplasm of unspecified part of right bronchus or lung: Secondary | ICD-10-CM

## 2016-09-28 DIAGNOSIS — I10 Essential (primary) hypertension: Secondary | ICD-10-CM

## 2016-09-28 DIAGNOSIS — C7951 Secondary malignant neoplasm of bone: Secondary | ICD-10-CM

## 2016-09-28 DIAGNOSIS — Z5111 Encounter for antineoplastic chemotherapy: Secondary | ICD-10-CM

## 2016-09-28 DIAGNOSIS — C3490 Malignant neoplasm of unspecified part of unspecified bronchus or lung: Secondary | ICD-10-CM

## 2016-09-28 LAB — CBC WITH DIFFERENTIAL/PLATELET
BASO%: 0.2 % (ref 0.0–2.0)
Basophils Absolute: 0 10*3/uL (ref 0.0–0.1)
EOS%: 3.6 % (ref 0.0–7.0)
Eosinophils Absolute: 0.2 10*3/uL (ref 0.0–0.5)
HEMATOCRIT: 37.9 % (ref 34.8–46.6)
HGB: 12.2 g/dL (ref 11.6–15.9)
LYMPH#: 2.3 10*3/uL (ref 0.9–3.3)
LYMPH%: 45.1 % (ref 14.0–49.7)
MCH: 30.7 pg (ref 25.1–34.0)
MCHC: 32.2 g/dL (ref 31.5–36.0)
MCV: 95.5 fL (ref 79.5–101.0)
MONO#: 0.5 10*3/uL (ref 0.1–0.9)
MONO%: 9.1 % (ref 0.0–14.0)
NEUT%: 42 % (ref 38.4–76.8)
NEUTROS ABS: 2.1 10*3/uL (ref 1.5–6.5)
PLATELETS: 253 10*3/uL (ref 145–400)
RBC: 3.97 10*6/uL (ref 3.70–5.45)
RDW: 13.9 % (ref 11.2–14.5)
WBC: 5 10*3/uL (ref 3.9–10.3)

## 2016-09-28 LAB — COMPREHENSIVE METABOLIC PANEL
ALK PHOS: 70 U/L (ref 38–126)
ALT: 17 U/L (ref 14–54)
AST: 22 U/L (ref 15–41)
Albumin: 4.2 g/dL (ref 3.5–5.0)
Anion gap: 9 (ref 5–15)
BILIRUBIN TOTAL: 0.7 mg/dL (ref 0.3–1.2)
BUN: 21 mg/dL — AB (ref 6–20)
CALCIUM: 9.4 mg/dL (ref 8.9–10.3)
CO2: 31 mmol/L (ref 22–32)
CREATININE: 0.88 mg/dL (ref 0.44–1.00)
Chloride: 100 mmol/L — ABNORMAL LOW (ref 101–111)
GFR calc Af Amer: >60 mL/min (ref >60)
Glucose, Bld: 96 mg/dL (ref 65–99)
Potassium: 3.6 mmol/L (ref 3.5–5.1)
Sodium: 140 mmol/L (ref 135–145)
Total Protein: 8 g/dL (ref 6.5–8.1)

## 2016-09-28 MED ORDER — OXYCODONE HCL 10 MG PO TABS: 10.0000 mg | ORAL_TABLET | Freq: Four times a day (QID) | ORAL | 0 refills | Status: DC | PRN

## 2016-09-28 MED FILL — oxyCODONE HCL 10 MG TABS: 10 | 22 days supply | Qty: 90 | Fill #0

## 2016-09-28 NOTE — Progress Notes (Signed)
Beth [name] [phone]   [phone]  OFFICE PROGRESS NOTE  [name], MD [place] [address]  DIAGNOSIS: Metastatic non-small cell lung cancer, adenocarcinoma with positive EGFR mutation in exon 19 diagnosed in June 2013  PRIOR THERAPY: Palliative radiotherapy to the right lung under the care of Dr. Valere Dross completed on 10/29/2011.  CURRENT THERAPY: 1) [name] 150 mg by mouth daily started 10/11/2011, status post 58 months of treatment. 2) Xgeva 120 mg subcutaneously every 8 weeks.  INTERVAL HISTORY: Beth [name] 56 y.o. female returns to the clinic today for follow-up visit. The patient is feeling fine today was no specific complaints. She denied having any significant chest pain but has shortness breath with exertion with no cough or hemoptysis. She denied having any fever or chills. She has no nausea, vomiting, diarrhea or constipation. She continues to tolerate her treatment with [name] fairly well. She is here today for evaluation and repeat blood work. She is requesting refill of her pain medication.  MEDICAL HISTORY: Past Medical History:  Diagnosis Date  . Anxiety   . Arthritis of knee, degenerative   . Asthma   . Complication of anesthesia    difficulty waking up  . Depression   . Endocarditis    as teenager  . Fibromyalgia   . Flu-like symptoms 05/12/2016  . GERD (gastroesophageal reflux disease)   . Heart murmur   . History of radiation therapy 09/29/11-11/04/2011   right lung 2700cGy 15 sessions  . Hypertension   . Lung mass    R- ADENOCARCINOMA  . Osseous metastasis ([place]) 09/20/11   per PET scan  . Pleural effusion 08/30/11  . Shortness of breath   . Sleep apnea    no longer using CPAP    ALLERGIES:  is allergic to meloxicam.  MEDICATIONS:  Current Outpatient Prescriptions  Medication Sig Dispense Refill  . albuterol-ipratropium (COMBIVENT) 18-103 MCG/ACT inhaler Inhale 1 puff  into the lungs every 6 (six) hours as needed for wheezing or shortness of breath.    Marland Kitchen amLODipine (NORVASC) 5 MG tablet TAKE 1 TABLET BY MOUTH EVERY DAY 90 tablet 0  . COMBIVENT RESPIMAT 20-100 MCG/ACT AERS respimat INHALE 1 PUFF INTO THE LUNGS EVERY 6 HOURS AS NEEDED FOR WHEEZING 4 g 1  . cyclobenzaprine (FLEXERIL) 10 MG tablet TAKE 1 TABLET(10 MG) BY MOUTH THREE TIMES DAILY AS NEEDED FOR MUSCLE SPASMS 30 tablet 0  . diphenhydrAMINE (BENADRYL) 25 MG tablet Take 25 mg by mouth every 6 (six) hours as needed.    . diphenoxylate-atropine (LOMOTIL) 2.5-0.025 MG tablet TAKE 2 TABLETS BY MOUTH FOUR TIMES DAILY AS NEEDED FOR DIARRHEA OR LOOSE STOOL. ALTERNATE WITH IMMODIUM. MAX OF 8 TABLETS PER DAY 30 tablet 0  . ergocalciferol (VITAMIN D2) 50000 units capsule Take 1 capsule (50,000 Units total) by mouth once a week. 12 capsule 3  . famotidine (PEPCID) 20 MG tablet One after bfast and after supper 60 tablet 2  . fish oil-omega-3 fatty acids 1000 MG capsule Take 2 g by mouth daily.    . furosemide (LASIX) 20 MG tablet TAKE 1 TABLET BY MOUTH DAILY AS NEEDED FOR SEVERE EDEMA 30 tablet 0  . hydrochlorothiazide (HYDRODIURIL) 25 MG tablet TAKE 1 TABLET BY MOUTH EVERY DAY 90 tablet 2  . ibuprofen (ADVIL,MOTRIN) 800 MG tablet TAKE 1 TABLET(800 MG) BY MOUTH DAILY AS NEEDED 30 tablet 0  . ketoconazole (NIZORAL) 2 % shampoo Apply 1 application topically 2 (two)  times a week. 120 mL 0  . loperamide (IMODIUM) 2 MG capsule Take 2 mg by mouth 4 (four) times daily as needed for diarrhea or loose stools.    . mirtazapine (REMERON) 30 MG tablet Take 1 tablet (30 mg total) by mouth at bedtime. 30 tablet 2  . mometasone (NASONEX) 50 MCG/ACT nasal spray Place 2 sprays into the nose daily. 17 g 12  . Multiple Vitamins-Minerals (MULTIVITAMIN WITH MINERALS) tablet Take 1 tablet by mouth daily.    . mupirocin ointment (BACTROBAN) 2 % APPLY INTO EACH NOSTRIL TWICE DAILY 22 g 0  . NONFORMULARY OR COMPOUNDED ITEM Cbcd, cmp,ua   Dx  hx fever, hx endocarditis, 1 each 0  . Oxycodone HCl 10 MG TABS Take 1 tablet (10 mg total) by mouth every 6 (six) hours as needed. 90 tablet 0  . predniSONE (DELTASONE) 10 MG tablet taper as follows:4-4-4-3-3-2-2 22 tablet 0  . PREMARIN vaginal cream Place 1 application vaginally 2 (two) times daily.  5  . Probiotic Product (ALIGN PO) Take 1 capsule by mouth daily.    . prochlorperazine (COMPAZINE) 10 MG tablet TAKE ONE TABLET BY MOUTH EVERY 6 HOURS AS NEEDED 385 tablet 0  . [name] 150 MG tablet TAKE 1 TABLET BY MOUTH EVERY DAY ON AN EMPTY STOMACH 1 HOUR BEFORE OR 2 HOURS AFTER A MEAL 30 tablet 0  . temazepam (RESTORIL) 15 MG capsule Take 1 capsule (15 mg total) by mouth at bedtime as needed for sleep. 30 capsule 0  . triamcinolone (KENALOG) 0.025 % cream Apply 1 application topically 2 (two) times daily. 30 g 0   No current facility-administered medications for this visit.     SURGICAL HISTORY:  Past Surgical History:  Procedure Laterality Date  . THORACENTESIS  09/02/11, 09/09/11   right-side pleural effusion  . TUBAL LIGATION  2002    REVIEW OF SYSTEMS:  A comprehensive review of systems was negative except for: Constitutional: positive for fatigue   PHYSICAL EXAMINATION: General appearance: alert, cooperative, fatigued and no distress Head: Normocephalic, without obvious abnormality, atraumatic Neck: no adenopathy, no JVD, supple, symmetrical, trachea midline and thyroid not enlarged, symmetric, no tenderness/mass/nodules Lymph nodes: Cervical, supraclavicular, and axillary nodes normal. Resp: clear to auscultation bilaterally Back: symmetric, no curvature. ROM normal. No CVA tenderness. Cardio: regular rate and rhythm, S1, S2 normal, no murmur, click, rub or gallop GI: soft, non-tender; bowel sounds normal; no masses,  no organomegaly Extremities: extremities normal, atraumatic, no cyanosis or edema  ECOG PERFORMANCE STATUS: 1 - Symptomatic but completely ambulatory  Blood  pressure 139/83, pulse 81, temperature 98.4 F (36.9 C), temperature source Oral, resp. rate 20, height _0  (1.702 m), weight 273 lb 14.4 oz (124.2 kg), last menstrual period 09/28/2011, SpO2 95 %.  LABORATORY DATA: Lab Results  Component Value Date   WBC 5.0 09/28/2016   HGB 12.2 09/28/2016   HCT 37.9 09/28/2016   MCV 95.5 09/28/2016   PLT 253 09/28/2016      Chemistry      Component Value Date/Time   NA 141 09/06/2016 1117   K 4.1 09/06/2016 1117   CL 100 03/01/2016 1110   CL 104 08/18/2012 1154   CO2 31 (H) 09/06/2016 1117   BUN 18.2 09/06/2016 1117   CREATININE 0.8 09/06/2016 1117      Component Value Date/Time   CALCIUM 9.6 09/06/2016 1117   ALKPHOS 77 09/06/2016 1117   AST 24 09/06/2016 1117   ALT 19 09/06/2016 1117   BILITOT 0.66  09/06/2016 1117       RADIOGRAPHIC STUDIES: No results found.  ASSESSMENT AND PLAN:  This is a very pleasant 56 years old African-American female with stage IV non-small cell lung cancer, adenocarcinoma with positive EGFR mutation in exon 78 diagnosed in June 2013. The patient is currently on treatment with [name] 150 mg by mouth daily status post 58 months of treatment. She has been tolerating her treatment well with no significant adverse effects. I recommended for the patient to continue her current treatment with [name]. I will see her back for follow-up visit in one month for evaluation after repeating CT scan of the chest, abdomen and pelvis for restaging of her disease. For pain management, I gave the patient referral of oxycodone. For the metastatic bone disease, she will continue her current treatment with Xgeva every 8 weeks. She was advised to call immediately if she has any concerning symptoms in the interval. The patient voices understanding of current disease status and treatment options and is in agreement with the current care plan. All questions were answered. The patient knows to call the clinic with any problems,  questions or concerns. We can certainly see the patient much sooner if necessary. I spent 10 minutes counseling the patient face to face. The total time spent in the appointment was 15 minutes.  Disclaimer: This note was dictated with voice recognition software. Similar sounding words can inadvertently be transcribed and may not be corrected upon review.

## 2016-09-28 NOTE — Telephone Encounter (Signed)
Scheduled appt per 7/10 los - sent reminder letter in the mail with appt date and time - [facility] to contact patient with CT schedule.

## 2016-09-29 ENCOUNTER — Encounter: Payer: Medicare Other | Admitting: Family Medicine

## 2016-10-05 ENCOUNTER — Encounter: Payer: Self-pay | Admitting: Family Medicine

## 2016-10-05 ENCOUNTER — Ambulatory Visit (INDEPENDENT_AMBULATORY_CARE_PROVIDER_SITE_OTHER): Payer: Medicare Other | Admitting: Family Medicine

## 2016-10-05 VITALS — BP 128/90 | HR 105 | Ht 65.0 in | Wt 276.0 lb

## 2016-10-05 DIAGNOSIS — I10 Essential (primary) hypertension: Secondary | ICD-10-CM

## 2016-10-05 DIAGNOSIS — C3491 Malignant neoplasm of unspecified part of right bronchus or lung: Secondary | ICD-10-CM

## 2016-10-05 NOTE — Progress Notes (Signed)
Subjective:     Patient ID: Beth [name], female   DOB: Sep 11, 1960, 56 y.o.   MRN: [id number]  HPI Patient was initially scheduled for a "physical ". However, she has paperwork which needs to be filled out and wishes to discuss couple other things.  She has been on disability since 2013 when she was diagnosed with stage IV adenocarcinoma lung. She is followed monthly by oncology. She has difficulty concentrating along with chronic fatigue and daily severe bone and muscle aches.  She was started recently by her oncologist on Remeron. This initially helped her sleep somewhat but not at this point. She states she was diagnosed with bipolar disorder about 5 or 6 years ago after diagnosis of her cancer she was lost to follow-up. She has had periods of depression but also periods of high energy and occasionally impulsiveness and inability to sleep sometimes several nights at a time. Sometimes has racing thoughts. No delusions or hallucinations. She thinks she took Lamictal previously. Denies any active depression currently. No suicidal ideation.  Hypertension treated with amlodipine and HCTZ. Compliant with therapy. No headaches. No chest pains. She has chronic dyspnea which is unchanged  Past Medical History:  Diagnosis Date  . Anxiety   . Arthritis of knee, degenerative   . Asthma   . Complication of anesthesia    difficulty waking up  . Depression   . Endocarditis    as teenager  . Fibromyalgia   . Flu-like symptoms 05/12/2016  . GERD (gastroesophageal reflux disease)   . Heart murmur   . History of radiation therapy 09/29/11-11/04/2011   right lung 2700cGy 15 sessions  . Hypertension   . Lung mass    R- ADENOCARCINOMA  . Osseous metastasis ([place]) 09/20/11   per PET scan  . Pleural effusion 08/30/11  . Shortness of breath   . Sleep apnea    no longer using CPAP   Past Surgical History:  Procedure Laterality Date  . THORACENTESIS  09/02/11, 09/09/11   right-side pleural effusion  .  TUBAL LIGATION  2002    reports that she quit smoking about 5 years ago. Her smoking use included Cigarettes. She has a 7.50 pack-year smoking history. She has never used smokeless tobacco. She reports that she does not drink alcohol or use drugs. family history includes Alcohol abuse in her father; Hypertension in her maternal aunt, maternal grandfather, maternal grandmother, maternal uncle, and mother; Sarcoidosis in her sister. Allergies  Allergen Reactions  . Meloxicam     Possible GI bleed     Review of Systems  Constitutional: Positive for fatigue. Negative for chills and fever.  Eyes: Negative for visual disturbance.  Respiratory: Positive for shortness of breath. Negative for cough, chest tightness and wheezing.   Cardiovascular: Negative for chest pain, palpitations and leg swelling.  Neurological: Negative for dizziness, seizures, syncope, weakness, light-headedness and headaches.       Objective:   Physical Exam  Constitutional: She appears well-developed and well-nourished.  Eyes: Pupils are equal, round, and reactive to light.  Neck: Neck supple. No JVD present. No thyromegaly present.  Cardiovascular: Normal rate and regular rhythm.  Exam reveals no gallop.   Pulmonary/Chest: Effort normal. No respiratory distress. She has no wheezes.  She has diminished breath sounds right lung base which is chronic  Musculoskeletal: She exhibits no edema.  Neurological: She is alert.  Skin: No rash noted.  Psychiatric: She has a normal mood and affect. Her behavior is normal.  Assessment:     #1 stage IV adenocarcinoma of the lung  #2 history of reported bipolar disorder not currently on mood stabilizer  #3 hypertension stable    Plan:     -She is strongly encouraged to set up follow-up with psychiatry and she already has established contact from a few ago. She will let us know if she cannot get into that office -We'll complete paperwork regarding her  disability -Continue amlodipine and HCTZ for hypertension  [name] MD [facility]

## 2016-10-05 NOTE — Patient Instructions (Signed)
See about getting follow up with psychiatrist.   Let me know if they cannot get you in to be seen.

## 2016-10-07 ENCOUNTER — Encounter: Payer: Self-pay | Admitting: Family Medicine

## 2016-10-08 ENCOUNTER — Telehealth: Payer: Self-pay

## 2016-10-08 DIAGNOSIS — Z0289 Encounter for other administrative examinations: Secondary | ICD-10-CM

## 2016-10-08 NOTE — Telephone Encounter (Signed)
Pt called trying to get her CT scheduled for 10/29/16. The CT has been ordered. The PA is not done yet. inbasket sent to [facility] to get PA.

## 2016-10-15 ENCOUNTER — Other Ambulatory Visit: Payer: Self-pay | Admitting: Family Medicine

## 2016-10-15 ENCOUNTER — Telehealth: Payer: Self-pay | Admitting: Family Medicine

## 2016-10-15 NOTE — Telephone Encounter (Signed)
Dr. Elease Hashimoto pt was wanting to know if you will still consider seeing her son [facility] MADUJIBEYA) as a new pt?  He is in town now and she would like for you to see him today b/c he has a injured behind and his insurance does not start until September 2018.  Pt would like for you to give her call.

## 2016-10-15 NOTE — Telephone Encounter (Signed)
Refill once.  Avoid regular use. 

## 2016-10-17 NOTE — Telephone Encounter (Signed)
We can see him but this would need to be set up as a new pt visit.

## 2016-10-18 NOTE — Telephone Encounter (Signed)
Called pt and she will call back to get son set-up as new patient.

## 2016-10-22 NOTE — Telephone Encounter (Signed)
Pts mother has called back in and pts son Beth [name]) has been scheduled.

## 2016-10-25 ENCOUNTER — Other Ambulatory Visit: Payer: Self-pay | Admitting: *Deleted

## 2016-10-25 DIAGNOSIS — C3490 Malignant neoplasm of unspecified part of unspecified bronchus or lung: Secondary | ICD-10-CM

## 2016-10-25 DIAGNOSIS — Z5111 Encounter for antineoplastic chemotherapy: Secondary | ICD-10-CM

## 2016-10-25 DIAGNOSIS — I10 Essential (primary) hypertension: Secondary | ICD-10-CM

## 2016-10-25 MED ORDER — OXYCODONE HCL 10 MG PO TABS: 10.0000 mg | ORAL_TABLET | Freq: Four times a day (QID) | ORAL | 0 refills | Status: DC | PRN

## 2016-10-25 NOTE — Telephone Encounter (Signed)
Pt called with request for refill percocet. Advised pt ready for pick up.

## 2016-10-26 MED FILL — oxyCODONE HCL 10 MG TABS: 10 | 23 days supply | Qty: 90 | Fill #0

## 2016-10-27 ENCOUNTER — Telehealth: Payer: Self-pay | Admitting: Family Medicine

## 2016-10-27 DIAGNOSIS — Z5111 Encounter for antineoplastic chemotherapy: Secondary | ICD-10-CM

## 2016-10-27 DIAGNOSIS — Z8659 Personal history of other mental and behavioral disorders: Secondary | ICD-10-CM

## 2016-10-27 DIAGNOSIS — F419 Anxiety disorder, unspecified: Secondary | ICD-10-CM

## 2016-10-27 DIAGNOSIS — C3491 Malignant neoplasm of unspecified part of right bronchus or lung: Secondary | ICD-10-CM

## 2016-10-27 DIAGNOSIS — I10 Essential (primary) hypertension: Secondary | ICD-10-CM

## 2016-10-27 NOTE — Telephone Encounter (Signed)
[facility] or [facility] might be places to start.

## 2016-10-27 NOTE — Telephone Encounter (Signed)
° ° °  Pt call to say the below med is not working and would like something else   mirtazapine (REMERON) 30 MG tablet

## 2016-10-27 NOTE — Telephone Encounter (Signed)
Patient states that she can not find a psychiatrist in her network and cant afford to pay for one out of pocket.  Any suggestions?

## 2016-10-27 NOTE — Telephone Encounter (Signed)
This medication(Remeron) was started by her oncologist. She has history of reported bipolar disorder and my strong recommendation is that she follow-up with psychiatrist as per my last note. Switching her to another antidepressant without being on mood stabilizer first could potentially trigger mania

## 2016-10-28 ENCOUNTER — Other Ambulatory Visit: Payer: Self-pay | Admitting: Internal Medicine

## 2016-10-28 DIAGNOSIS — I10 Essential (primary) hypertension: Secondary | ICD-10-CM

## 2016-10-28 DIAGNOSIS — Z8659 Personal history of other mental and behavioral disorders: Secondary | ICD-10-CM

## 2016-10-28 DIAGNOSIS — F419 Anxiety disorder, unspecified: Secondary | ICD-10-CM

## 2016-10-28 DIAGNOSIS — Z5111 Encounter for antineoplastic chemotherapy: Secondary | ICD-10-CM

## 2016-10-28 DIAGNOSIS — C3491 Malignant neoplasm of unspecified part of right bronchus or lung: Secondary | ICD-10-CM

## 2016-10-29 ENCOUNTER — Ambulatory Visit (HOSPITAL_COMMUNITY)
Admission: RE | Admit: 2016-10-29 | Discharge: 2016-10-29 | Disposition: A | Discharge status: Home / Self Care | Payer: Medicare Other | Source: Ambulatory Visit | Attending: Internal Medicine | Admitting: Internal Medicine

## 2016-10-29 ENCOUNTER — Other Ambulatory Visit (HOSPITAL_BASED_OUTPATIENT_CLINIC_OR_DEPARTMENT_OTHER): Payer: Medicare Other

## 2016-10-29 DIAGNOSIS — C3490 Malignant neoplasm of unspecified part of unspecified bronchus or lung: Secondary | ICD-10-CM

## 2016-10-29 DIAGNOSIS — Z5111 Encounter for antineoplastic chemotherapy: Secondary | ICD-10-CM | POA: Diagnosis not present

## 2016-10-29 DIAGNOSIS — R918 Other nonspecific abnormal finding of lung field: Secondary | ICD-10-CM | POA: Insufficient documentation

## 2016-10-29 DIAGNOSIS — C7951 Secondary malignant neoplasm of bone: Secondary | ICD-10-CM | POA: Diagnosis not present

## 2016-10-29 DIAGNOSIS — I10 Essential (primary) hypertension: Secondary | ICD-10-CM | POA: Diagnosis not present

## 2016-10-29 DIAGNOSIS — Z85118 Personal history of other malignant neoplasm of bronchus and lung: Secondary | ICD-10-CM | POA: Diagnosis not present

## 2016-10-29 LAB — CBC WITH DIFFERENTIAL/PLATELET
BASO%: 0.5 % (ref 0.0–2.0)
BASOS ABS: 0 10*3/uL (ref 0.0–0.1)
EOS ABS: 0.2 10*3/uL (ref 0.0–0.5)
EOS%: 4.1 % (ref 0.0–7.0)
HEMATOCRIT: 36.9 % (ref 34.8–46.6)
HEMOGLOBIN: 12.1 g/dL (ref 11.6–15.9)
LYMPH#: 1.7 10*3/uL (ref 0.9–3.3)
LYMPH%: 34 % (ref 14.0–49.7)
MCH: 30.2 pg (ref 25.1–34.0)
MCHC: 32.7 g/dL (ref 31.5–36.0)
MCV: 92.6 fL (ref 79.5–101.0)
MONO#: 0.4 10*3/uL (ref 0.1–0.9)
MONO%: 8.7 % (ref 0.0–14.0)
NEUT%: 52.7 % (ref 38.4–76.8)
NEUTROS ABS: 2.6 10*3/uL (ref 1.5–6.5)
PLATELETS: 278 10*3/uL (ref 145–400)
RBC: 3.99 10*6/uL (ref 3.70–5.45)
RDW: 14.3 % (ref 11.2–14.5)
WBC: 4.9 10*3/uL (ref 3.9–10.3)

## 2016-10-29 LAB — COMPREHENSIVE METABOLIC PANEL
ALBUMIN: 3.5 g/dL (ref 3.5–5.0)
ALK PHOS: 85 U/L (ref 40–150)
ALT: 15 U/L (ref 0–55)
ANION GAP: 7 meq/L (ref 3–11)
AST: 20 U/L (ref 5–34)
BILIRUBIN TOTAL: 0.66 mg/dL (ref 0.20–1.20)
BUN: 16.1 mg/dL (ref 7.0–26.0)
CALCIUM: 9.6 mg/dL (ref 8.4–10.4)
CO2: 32 mEq/L — ABNORMAL HIGH (ref 22–29)
Chloride: 103 mEq/L (ref 98–109)
Creatinine: 0.8 mg/dL (ref 0.6–1.1)
Glucose: 101 mg/dl (ref 70–140)
POTASSIUM: 4.2 meq/L (ref 3.5–5.1)
Sodium: 142 mEq/L (ref 136–145)
TOTAL PROTEIN: 7.6 g/dL (ref 6.4–8.3)

## 2016-10-29 MED ORDER — IOPAMIDOL (ISOVUE-300) INJECTION 61%
100.0000 mL | Freq: Once | INTRAVENOUS | Status: AC | PRN
  Administered 2016-10-29: 100 mL via INTRAVENOUS

## 2016-10-29 MED ORDER — IOPAMIDOL (ISOVUE-300) INJECTION 61%
INTRAVENOUS | Status: AC
  Filled 2016-10-29: qty 100

## 2016-10-29 MED ORDER — MIRTAZAPINE 30 MG PO TABS: ORAL_TABLET | ORAL | 2 refills | Status: AC

## 2016-10-29 NOTE — Telephone Encounter (Signed)
Refill sent.

## 2016-10-29 NOTE — Telephone Encounter (Signed)
Ok to refill 

## 2016-10-29 NOTE — Telephone Encounter (Signed)
Patient states that she has tried both [facility] or [facility].  I explained that Dr Elease Hashimoto is not comfortable with changing her medications.  She would like to know if she can get a refill of the Remeron, because she wants to try it a little longer?

## 2016-11-01 ENCOUNTER — Telehealth: Payer: Self-pay | Admitting: Internal Medicine

## 2016-11-01 ENCOUNTER — Telehealth: Payer: Self-pay | Admitting: Medical Oncology

## 2016-11-01 NOTE — Telephone Encounter (Signed)
Pt cancelled appts for tomorrow due to illness , " ? Flu". Schedule request sent to r/s

## 2016-11-01 NOTE — Telephone Encounter (Signed)
R/s appt cancelled for 8/14 - patient is aware of new appt date and time.

## 2016-11-02 ENCOUNTER — Ambulatory Visit: Payer: Medicare Other | Admitting: Internal Medicine

## 2016-11-07 ENCOUNTER — Other Ambulatory Visit: Payer: Self-pay | Admitting: Internal Medicine

## 2016-11-07 DIAGNOSIS — C3490 Malignant neoplasm of unspecified part of unspecified bronchus or lung: Secondary | ICD-10-CM

## 2016-11-08 ENCOUNTER — Ambulatory Visit: Payer: PRIVATE HEALTH INSURANCE | Admitting: Internal Medicine

## 2016-11-12 ENCOUNTER — Telehealth: Payer: Self-pay | Admitting: Family Medicine

## 2016-11-12 NOTE — Telephone Encounter (Signed)
Pt is calling the flexeril 10 mg is not working and she is requesting something else for fibromyalgia . [facility]

## 2016-11-12 NOTE — Telephone Encounter (Signed)
Patient will call back next week to schedule an appointment

## 2016-11-12 NOTE — Telephone Encounter (Signed)
Recommend follow up next week to discuss options.

## 2016-11-16 ENCOUNTER — Other Ambulatory Visit: Payer: Self-pay | Admitting: Family Medicine

## 2016-11-16 ENCOUNTER — Encounter: Payer: Self-pay | Admitting: Internal Medicine

## 2016-11-16 ENCOUNTER — Ambulatory Visit (HOSPITAL_BASED_OUTPATIENT_CLINIC_OR_DEPARTMENT_OTHER): Payer: PRIVATE HEALTH INSURANCE | Admitting: Internal Medicine

## 2016-11-16 VITALS — BP 159/108 | HR 112 | Temp 98.5°F | Resp 19 | Ht 65.0 in | Wt 279.5 lb

## 2016-11-16 DIAGNOSIS — G893 Neoplasm related pain (acute) (chronic): Secondary | ICD-10-CM

## 2016-11-16 DIAGNOSIS — M545 Low back pain: Secondary | ICD-10-CM | POA: Diagnosis not present

## 2016-11-16 DIAGNOSIS — C7951 Secondary malignant neoplasm of bone: Secondary | ICD-10-CM

## 2016-11-16 DIAGNOSIS — R530 Neoplastic (malignant) related fatigue: Secondary | ICD-10-CM | POA: Diagnosis not present

## 2016-11-16 DIAGNOSIS — Z5111 Encounter for antineoplastic chemotherapy: Secondary | ICD-10-CM

## 2016-11-16 DIAGNOSIS — E663 Overweight: Secondary | ICD-10-CM

## 2016-11-16 DIAGNOSIS — I10 Essential (primary) hypertension: Secondary | ICD-10-CM

## 2016-11-16 DIAGNOSIS — C3491 Malignant neoplasm of unspecified part of right bronchus or lung: Secondary | ICD-10-CM

## 2016-11-16 NOTE — Progress Notes (Signed)
    [facility] [phone]   [phone]  OFFICE PROGRESS NOTE  Burchette, Bruce W, MD [address] [place] [address]  DIAGNOSIS: Metastatic non-small cell lung cancer, adenocarcinoma with positive EGFR mutation in exon 19 diagnosed in June 2013  PRIOR THERAPY: Palliative radiotherapy to the right lung under the care of Dr. Murray completed on 10/29/2011.  CURRENT THERAPY: 1) [name] 150 mg by mouth daily started 10/11/2011, status post 60 months of treatment. 2) Xgeva 120 mg subcutaneously every 8 weeks.  INTERVAL HISTORY: Beth [name] 56 y.o. female returns to the clinic today for follow-up visit. The patient is feeling fine today except for aching pain in the lower back. She denied having any current chest pain, shortness of breath except with exertion, cough or hemoptysis. She has no nausea, vomiting, diarrhea or constipation. She denied having any skin rash. She has been tolerating her treatment with [name] fairly well. She had repeat CT scan of the chest, abdomen and pelvis performed recently and she is here for evaluation and discussion of her scan results and treatment options.  MEDICAL HISTORY: Past Medical History:  Diagnosis Date  . Anxiety   . Arthritis of knee, degenerative   . Asthma   . Complication of anesthesia    difficulty waking up  . Depression   . Endocarditis    as teenager  . Fibromyalgia   . Flu-like symptoms 05/12/2016  . GERD (gastroesophageal reflux disease)   . Heart murmur   . History of radiation therapy 09/29/11-11/04/2011   right lung 2700cGy 15 sessions  . Hypertension   . Lung mass    R- ADENOCARCINOMA  . Osseous metastasis (HCC) 09/20/11   per PET scan  . Pleural effusion 08/30/11  . Shortness of breath   . Sleep apnea    no longer using CPAP    ALLERGIES:  is allergic to meloxicam.  MEDICATIONS:  Current Outpatient Prescriptions  Medication Sig Dispense Refill  .  albuterol-ipratropium (COMBIVENT) 18-103 MCG/ACT inhaler Inhale 1 puff into the lungs every 6 (six) hours as needed for wheezing or shortness of breath.    . amLODipine (NORVASC) 5 MG tablet TAKE 1 TABLET BY MOUTH EVERY DAY 90 tablet 0  . COMBIVENT RESPIMAT 20-100 MCG/ACT AERS respimat INHALE 1 PUFF INTO THE LUNGS EVERY 6 HOURS AS NEEDED FOR WHEEZING 4 g 1  . cyclobenzaprine (FLEXERIL) 10 MG tablet TAKE 1 TABLET(10 MG) BY MOUTH THREE TIMES DAILY AS NEEDED FOR MUSCLE SPASMS 30 tablet 0  . diphenhydrAMINE (BENADRYL) 25 MG tablet Take 25 mg by mouth every 6 (six) hours as needed.    . diphenoxylate-atropine (LOMOTIL) 2.5-0.025 MG tablet TAKE 2 TABLETS BY MOUTH FOUR TIMES DAILY AS NEEDED FOR DIARRHEA OR LOOSE STOOL. ALTERNATE WITH IMMODIUM. MAX OF 8 TABLETS PER DAY 30 tablet 0  . ergocalciferol (VITAMIN D2) 50000 units capsule Take 1 capsule (50,000 Units total) by mouth once a week. 12 capsule 3  . famotidine (PEPCID) 20 MG tablet One after bfast and after supper 60 tablet 2  . fish oil-omega-3 fatty acids 1000 MG capsule Take 2 g by mouth daily.    . furosemide (LASIX) 20 MG tablet TAKE 1 TABLET BY MOUTH DAILY AS NEEDED FOR SEVERE EDEMA 30 tablet 0  . hydrochlorothiazide (HYDRODIURIL) 25 MG tablet TAKE 1 TABLET BY MOUTH EVERY DAY 90 tablet 2  . ibuprofen (ADVIL,MOTRIN) 800 MG tablet TAKE 1 TABLET(800 MG) BY MOUTH DAILY AS NEEDED 30 tablet 0  .   ketoconazole (NIZORAL) 2 % shampoo Apply 1 application topically 2 (two) times a week. 120 mL 0  . loperamide (IMODIUM) 2 MG capsule Take 2 mg by mouth 4 (four) times daily as needed for diarrhea or loose stools.    . mirtazapine (REMERON) 30 MG tablet TAKE 1 TABLET(30 MG) BY MOUTH AT BEDTIME 30 tablet 2  . mometasone (NASONEX) 50 MCG/ACT nasal spray Place 2 sprays into the nose daily. 17 g 12  . Multiple Vitamins-Minerals (MULTIVITAMIN WITH MINERALS) tablet Take 1 tablet by mouth daily.    . mupirocin ointment (BACTROBAN) 2 % APPLY INTO EACH NOSTRIL TWICE DAILY  22 g 0  . NONFORMULARY OR COMPOUNDED ITEM Cbcd, cmp,ua   Dx hx fever, hx endocarditis, 1 each 0  . Oxycodone HCl 10 MG TABS Take 1 tablet (10 mg total) by mouth every 6 (six) hours as needed. 90 tablet 0  . PREMARIN vaginal cream Place 1 application vaginally 2 (two) times daily.  5  . Probiotic Product (ALIGN PO) Take 1 capsule by mouth daily.    . prochlorperazine (COMPAZINE) 10 MG tablet TAKE ONE TABLET BY MOUTH EVERY 6 HOURS AS NEEDED 385 tablet 0  . [name] 150 MG tablet TAKE 1 TABLET BY MOUTH EVERY DAY ON AN EMPTY STOMACH 1 HOUR BEFORE OR 2 HOURS AFTER A MEAL 30 tablet 0  . triamcinolone (KENALOG) 0.025 % cream Apply 1 application topically 2 (two) times daily. 30 g 0   No current facility-administered medications for this visit.     SURGICAL HISTORY:  Past Surgical History:  Procedure Laterality Date  . THORACENTESIS  09/02/11, 09/09/11   right-side pleural effusion  . TUBAL LIGATION  2002    REVIEW OF SYSTEMS:  Constitutional: positive for fatigue Eyes: negative Ears, nose, mouth, throat, and face: negative Respiratory: positive for dyspnea on exertion Cardiovascular: negative Gastrointestinal: negative Genitourinary:negative Integument/breast: negative Hematologic/lymphatic: negative Musculoskeletal:positive for back pain Neurological: negative Behavioral/Psych: negative Endocrine: negative Allergic/Immunologic: negative   PHYSICAL EXAMINATION: General appearance: alert, cooperative, fatigued and no distress Head: Normocephalic, without obvious abnormality, atraumatic Neck: no adenopathy, no JVD, supple, symmetrical, trachea midline and thyroid not enlarged, symmetric, no tenderness/mass/nodules Lymph nodes: Cervical, supraclavicular, and axillary nodes normal. Resp: clear to auscultation bilaterally Back: symmetric, no curvature. ROM normal. No CVA tenderness. Cardio: regular rate and rhythm, S1, S2 normal, no murmur, click, rub or gallop GI: soft, non-tender; bowel  sounds normal; no masses,  no organomegaly Extremities: extremities normal, atraumatic, no cyanosis or edema Neurologic: Alert and oriented X 3, normal strength and tone. Normal symmetric reflexes. Normal coordination and gait  ECOG PERFORMANCE STATUS: 1 - Symptomatic but completely ambulatory  Blood pressure (!) 159/108, pulse (!) 112, temperature 98.5 F (36.9 C), temperature source Oral, resp. rate 19, height 5' 5" (1.651 m), weight 279 lb 8 oz (126.8 kg), last menstrual period 09/28/2011, SpO2 97 %.  LABORATORY DATA: Lab Results  Component Value Date   WBC 4.9 10/29/2016   HGB 12.1 10/29/2016   HCT 36.9 10/29/2016   MCV 92.6 10/29/2016   PLT 278 10/29/2016      Chemistry      Component Value Date/Time   NA 142 10/29/2016 1057   K 4.2 10/29/2016 1057   CL 100 (L) 09/28/2016 1505   CL 104 08/18/2012 1154   CO2 32 (H) 10/29/2016 1057   BUN 16.1 10/29/2016 1057   CREATININE 0.8 10/29/2016 1057      Component Value Date/Time   CALCIUM 9.6 10/29/2016 1057   ALKPHOS  85 10/29/2016 1057   AST 20 10/29/2016 1057   ALT 15 10/29/2016 1057   BILITOT 0.66 10/29/2016 1057       RADIOGRAPHIC STUDIES: Ct Chest W Contrast  Result Date: 10/29/2016 CLINICAL DATA:  History of right lung cancer.  Cough. EXAM: CT CHEST, ABDOMEN, AND PELVIS WITH CONTRAST TECHNIQUE: Multidetector CT imaging of the chest, abdomen and pelvis was performed following the standard protocol during bolus administration of intravenous contrast. CONTRAST:  100mL ISOVUE-300 IOPAMIDOL (ISOVUE-300) INJECTION 61% COMPARISON:  07/20/2016 FINDINGS: CT CHEST FINDINGS Cardiovascular: The heart is normal in size and stable. No pericardial effusion. The aorta is normal in caliber. No dissection. Stable calcifications near the aortic valve. Mediastinum/Nodes: No mediastinal or hilar mass or lymphadenopathy. Esophagus is grossly normal. Lungs/Pleura: Less conspicuous ground-glass density in the left upper on image number 51, likely  resolving atelectasis or inflammation or possibly contracting scar. Severe loss of volume in the right hemithorax possibly due to combination of surgery and radiation. There is a stable nodular lesion in right lower lobe on image number 89 measuring 2.7 cm. Persistent small right pleural effusion and pleural thickening. No infiltrates or effusions on the left side. Chest wall/ Musculoskeletal: Stable diffuse sclerotic metastatic bone disease. No pathologic fracture or canal compromise. CT ABDOMEN PELVIS FINDINGS Hepatobiliary: No focal hepatic lesions or intrahepatic biliary dilatation. Gallbladder is grossly normal. No common bile duct dilatation. Pancreas: No mass, inflammation or duct dilatation. Spleen: Normal size.  No focal lesions. Adrenals/Urinary Tract: The adrenal glands and kidneys appear grossly normal in stable. No ureteral or bladder calculi are mass. Stomach/Bowel: The stomach, duodenum, small bowel and colon are grossly normal. No acute inflammatory changes, mass lesions or obstructive findings. Colonic diverticulosis without findings for acute diverticulitis. Vascular/Lymphatic: Stable atherosclerotic calcifications involving the aorta. No aneurysm or dissection. The branch vessels are patent. No mesenteric or retroperitoneal mass or lymphadenopathy. Reproductive: The uterus and ovaries appear normal. Other: No pelvic mass or adenopathy. No free pelvic fluid collections. No inguinal mass or adenopathy. No abdominal wall hernia or subcutaneous lesions. Musculoskeletal: Diffuse sclerotic metastatic bone disease without pathologic fracture. IMPRESSION: 1. Less conspicuous ground-glass opacity in the left upper. Attention on future scans is suggested. 2. Stable severe volume loss in the right hemithorax. 3. Stable 2.7 cm right lower lobe pulmonary lesion. 4. No findings for metastatic disease involving the abdomen/pelvis. 5. Stable diffuse sclerotic metastatic bone disease without fracture.  Electronically Signed   By: P.  Gallerani M.D.   On: 10/29/2016 16:08   Ct Abdomen Pelvis W Contrast  Result Date: 10/29/2016 CLINICAL DATA:  History of right lung cancer.  Cough. EXAM: CT CHEST, ABDOMEN, AND PELVIS WITH CONTRAST TECHNIQUE: Multidetector CT imaging of the chest, abdomen and pelvis was performed following the standard protocol during bolus administration of intravenous contrast. CONTRAST:  100mL ISOVUE-300 IOPAMIDOL (ISOVUE-300) INJECTION 61% COMPARISON:  07/20/2016 FINDINGS: CT CHEST FINDINGS Cardiovascular: The heart is normal in size and stable. No pericardial effusion. The aorta is normal in caliber. No dissection. Stable calcifications near the aortic valve. Mediastinum/Nodes: No mediastinal or hilar mass or lymphadenopathy. Esophagus is grossly normal. Lungs/Pleura: Less conspicuous ground-glass density in the left upper on image number 51, likely resolving atelectasis or inflammation or possibly contracting scar. Severe loss of volume in the right hemithorax possibly due to combination of surgery and radiation. There is a stable nodular lesion in right lower lobe on image number 89 measuring 2.7 cm. Persistent small right pleural effusion and pleural thickening. No infiltrates or effusions on   the left side. Chest wall/ Musculoskeletal: Stable diffuse sclerotic metastatic bone disease. No pathologic fracture or canal compromise. CT ABDOMEN PELVIS FINDINGS Hepatobiliary: No focal hepatic lesions or intrahepatic biliary dilatation. Gallbladder is grossly normal. No common bile duct dilatation. Pancreas: No mass, inflammation or duct dilatation. Spleen: Normal size.  No focal lesions. Adrenals/Urinary Tract: The adrenal glands and kidneys appear grossly normal in stable. No ureteral or bladder calculi are mass. Stomach/Bowel: The stomach, duodenum, small bowel and colon are grossly normal. No acute inflammatory changes, mass lesions or obstructive findings. Colonic diverticulosis without  findings for acute diverticulitis. Vascular/Lymphatic: Stable atherosclerotic calcifications involving the aorta. No aneurysm or dissection. The branch vessels are patent. No mesenteric or retroperitoneal mass or lymphadenopathy. Reproductive: The uterus and ovaries appear normal. Other: No pelvic mass or adenopathy. No free pelvic fluid collections. No inguinal mass or adenopathy. No abdominal wall hernia or subcutaneous lesions. Musculoskeletal: Diffuse sclerotic metastatic bone disease without pathologic fracture. IMPRESSION: 1. Less conspicuous ground-glass opacity in the left upper. Attention on future scans is suggested. 2. Stable severe volume loss in the right hemithorax. 3. Stable 2.7 cm right lower lobe pulmonary lesion. 4. No findings for metastatic disease involving the abdomen/pelvis. 5. Stable diffuse sclerotic metastatic bone disease without fracture. Electronically Signed   By: Marijo Sanes M.D.   On: 10/29/2016 16:08    ASSESSMENT AND PLAN:  This is a very pleasant 56 years old African-American female with stage IV non-small cell lung cancer, adenocarcinoma with positive EGFR mutation in exon 19 diagnosed in June 2013. The patient is currently on treatment with [name] 150 mg by mouth daily status post 60 months of treatment. The patient is doing very well today with no specific complaints except for mild fatigue and low back pain. She has been tolerating her treatment well. She had repeat CT scan of the chest, abdomen and pelvis performed recently that showed no evidence for disease progression. I personally and independently reviewed the scan images and discuss the results with the patient today. I recommended for the patient to continue her current treatment with [name] with the same dose. For pain management she will continue her current treatment with oxycodone. For the metastatic bone disease she will continue her current treatment with Xgeva every 2 months. The patient would  come back for follow-up visit in 2 months for reevaluation and repeat blood work. She was advised to call immediately if she has any concerning symptoms in the interval. The patient voices understanding of current disease status and treatment options and is in agreement with the current care plan. All questions were answered. The patient knows to call the clinic with any problems, questions or concerns. We can certainly see the patient much sooner if necessary.  Disclaimer: This note was dictated with voice recognition software. Similar sounding words can inadvertently be transcribed and may not be corrected upon review.

## 2016-11-16 NOTE — Telephone Encounter (Signed)
Refill OK

## 2016-11-17 ENCOUNTER — Other Ambulatory Visit: Payer: Self-pay | Admitting: Family Medicine

## 2016-11-17 DIAGNOSIS — L219 Seborrheic dermatitis, unspecified: Secondary | ICD-10-CM

## 2016-11-18 NOTE — Telephone Encounter (Signed)
Patient is requesting for refills on Ketoconazole 2% shampoo and Triamcinolone 0.025% cream last filled on 09/23/2016. Please Advise if ok to refill.

## 2016-11-23 ENCOUNTER — Ambulatory Visit: Payer: PRIVATE HEALTH INSURANCE | Admitting: Family Medicine

## 2016-11-23 ENCOUNTER — Other Ambulatory Visit: Payer: Self-pay | Admitting: *Deleted

## 2016-11-23 DIAGNOSIS — Z5111 Encounter for antineoplastic chemotherapy: Secondary | ICD-10-CM

## 2016-11-23 DIAGNOSIS — I10 Essential (primary) hypertension: Secondary | ICD-10-CM

## 2016-11-23 DIAGNOSIS — C3490 Malignant neoplasm of unspecified part of unspecified bronchus or lung: Secondary | ICD-10-CM

## 2016-11-23 MED ORDER — OXYCODONE HCL 10 MG PO TABS
10.0000 mg | ORAL_TABLET | Freq: Four times a day (QID) | ORAL | 0 refills | Status: DC | PRN
Start: 2016-11-23 — End: 2016-12-20

## 2016-11-23 MED FILL — oxyCODONE HCL 10 MG TABS: 10 | 23 days supply | Qty: 90 | Fill #0

## 2016-11-23 NOTE — Telephone Encounter (Signed)
Notified pt Rx ready for pick up

## 2016-11-24 NOTE — Telephone Encounter (Signed)
Rx refills sent to the pharmacy

## 2016-11-25 ENCOUNTER — Telehealth: Payer: Self-pay | Admitting: Medical Oncology

## 2016-11-25 NOTE — Telephone Encounter (Signed)
[facility] has not been able to reach pt by phone for [place] delivery.. I tried her numbers and line just rang-No voice mail.

## 2016-11-26 ENCOUNTER — Other Ambulatory Visit: Payer: Self-pay | Admitting: Family Medicine

## 2016-11-26 DIAGNOSIS — C3491 Malignant neoplasm of unspecified part of right bronchus or lung: Secondary | ICD-10-CM

## 2016-12-09 ENCOUNTER — Encounter: Payer: Self-pay | Admitting: Family Medicine

## 2016-12-14 ENCOUNTER — Telehealth: Payer: Self-pay | Admitting: Family Medicine

## 2016-12-14 ENCOUNTER — Telehealth: Payer: Self-pay | Admitting: Medical Oncology

## 2016-12-14 NOTE — Telephone Encounter (Signed)
Pt is calling stating that she would like to see if Dr. Elease Hashimoto would call her something in for her dry cough and wheezing that she is experiencing state that she gets it every year at this time.  Pharm:  [facility] [address]

## 2016-12-14 NOTE — Telephone Encounter (Signed)
err

## 2016-12-14 NOTE — Telephone Encounter (Signed)
Per Julien Nordmann I told pt she can see Dr Elease Hashimoto or come to [facility]. She will call back if she wants appt for [facility].

## 2016-12-14 NOTE — Telephone Encounter (Signed)
Really would be best if she is seen if wheezing.

## 2016-12-14 NOTE — Telephone Encounter (Signed)
Pt reports coughing and wheezing x 2 weeks. Wheezes mostly hs. Asking for prednisone . Note to [place].

## 2016-12-15 NOTE — Telephone Encounter (Signed)
Spoke with patient and she will call back for an appointment.

## 2016-12-16 ENCOUNTER — Other Ambulatory Visit: Payer: Self-pay | Admitting: Internal Medicine

## 2016-12-16 DIAGNOSIS — C3490 Malignant neoplasm of unspecified part of unspecified bronchus or lung: Secondary | ICD-10-CM

## 2016-12-20 ENCOUNTER — Other Ambulatory Visit: Payer: Self-pay | Admitting: *Deleted

## 2016-12-20 DIAGNOSIS — Z5111 Encounter for antineoplastic chemotherapy: Secondary | ICD-10-CM

## 2016-12-20 DIAGNOSIS — C3490 Malignant neoplasm of unspecified part of unspecified bronchus or lung: Secondary | ICD-10-CM

## 2016-12-20 DIAGNOSIS — I10 Essential (primary) hypertension: Secondary | ICD-10-CM

## 2016-12-20 MED ORDER — OXYCODONE HCL 10 MG PO TABS: 10.0000 mg | ORAL_TABLET | Freq: Four times a day (QID) | ORAL | 0 refills | Status: DC | PRN

## 2016-12-20 NOTE — Telephone Encounter (Signed)
Pt notified pain med ready for pick up

## 2016-12-21 ENCOUNTER — Encounter: Payer: Self-pay | Admitting: Family Medicine

## 2016-12-21 ENCOUNTER — Ambulatory Visit (INDEPENDENT_AMBULATORY_CARE_PROVIDER_SITE_OTHER): Payer: Medicare Other | Admitting: Family Medicine

## 2016-12-21 VITALS — BP 110/80 | HR 100 | Temp 98.6°F | Wt 287.0 lb

## 2016-12-21 DIAGNOSIS — Z23 Encounter for immunization: Secondary | ICD-10-CM

## 2016-12-21 DIAGNOSIS — R059 Cough, unspecified: Secondary | ICD-10-CM

## 2016-12-21 DIAGNOSIS — R05 Cough: Secondary | ICD-10-CM

## 2016-12-21 MED ORDER — PREDNISONE 10 MG PO TABS: ORAL_TABLET | ORAL | 0 refills | Status: DC

## 2016-12-21 MED FILL — oxyCODONE HCL 10 MG TABS: 10 | 23 days supply | Qty: 90 | Fill #0

## 2016-12-21 NOTE — Progress Notes (Signed)
Subjective:     Patient ID: Beth [name], female   DOB: 25-Jun-1960, 56 y.o.   MRN: [id number]  HPI Patient has stage IV adenocarcinoma of the lung seen today with 1 month history of reported cough. She states she has also nightly wheezing. She has inhalers which she does not use consistently. Cough is dry. Denies any fever or chills. No hemoptysis. No increased dyspnea at rest or thick exertion. No pleuritic pain. Patient had CT scan August 2018 which showed no significant progression of disease. She currently is taking [name]. She has had some reflux issues in the past but cannot take acid suppression because of [name].  She had called recently requesting prednisone and we had requested that she come in to be evaluated  Past Medical History:  Diagnosis Date  . Anxiety   . Arthritis of knee, degenerative   . Asthma   . Complication of anesthesia    difficulty waking up  . Depression   . Endocarditis    as teenager  . Fibromyalgia   . Flu-like symptoms 05/12/2016  . GERD (gastroesophageal reflux disease)   . Heart murmur   . History of radiation therapy 09/29/11-11/04/2011   right lung 2700cGy 15 sessions  . Hypertension   . Lung mass    R- ADENOCARCINOMA  . Osseous metastasis ([place]) 09/20/11   per PET scan  . Pleural effusion 08/30/11  . Shortness of breath   . Sleep apnea    no longer using CPAP   Past Surgical History:  Procedure Laterality Date  . THORACENTESIS  09/02/11, 09/09/11   right-side pleural effusion  . TUBAL LIGATION  2002    reports that she quit smoking about 5 years ago. Her smoking use included Cigarettes. She has a 7.50 pack-year smoking history. She has never used smokeless tobacco. She reports that she does not drink alcohol or use drugs. family history includes Alcohol abuse in her father; Hypertension in her maternal aunt, maternal grandfather, maternal grandmother, maternal uncle, and mother; Sarcoidosis in her sister. Allergies  Allergen Reactions  .  Meloxicam     Possible GI bleed     Review of Systems  Constitutional: Negative for chills and fever.  HENT: Negative for congestion.   Respiratory: Positive for cough and wheezing.   Cardiovascular: Negative for chest pain.       Objective:   Physical Exam  Constitutional: She appears well-developed and well-nourished.  HENT:  Mouth/Throat: Oropharynx is clear and moist.  Cardiovascular: Normal rate and regular rhythm.   Pulmonary/Chest:  No wheezes noted at this time. She has diminished breath sounds right base which is chronic. Pulse oximetry 96%. No respiratory distress.       Assessment:     Persistent cough. Differential is post viral versus silent reflux.  We are limited in use of acid suppression because of her [name]    Plan:     -Patient was requesting prednisone but we did not see clear indication start this point. We did print prescription to start if she has increased wheezing -Dietary modification of GERD discussed. Avoid eating within 2 hours of bedtime -Consider elevate head of bed about 6 inches -Follow-up immediately for any fever, hemoptysis, or other concerns -Flu vaccine given  [name] MD [facility]

## 2016-12-21 NOTE — Patient Instructions (Signed)
Food Choices for Gastroesophageal Reflux Disease, Adult When you have gastroesophageal reflux disease (GERD), the foods you eat and your eating habits are very important. Choosing the right foods can help ease the discomfort of GERD. Consider working with a diet and nutrition specialist (dietitian) to help you make healthy food choices. What general guidelines should I follow? Eating plan  Choose healthy foods low in fat, such as fruits, vegetables, whole grains, low-fat dairy products, and lean meat, fish, and poultry.  Eat frequent, small meals instead of three large meals each day. Eat your meals slowly, in a relaxed setting. Avoid bending over or lying down until 2-3 hours after eating.  Limit high-fat foods such as fatty meats or fried foods.  Limit your intake of oils, butter, and shortening to less than 8 teaspoons each day.  Avoid the following: ? Foods that cause symptoms. These may be different for different people. Keep a food diary to keep track of foods that cause symptoms. ? Alcohol. ? Drinking large amounts of liquid with meals. ? Eating meals during the 2-3 hours before bed.  Cook foods using methods other than frying. This may include baking, grilling, or broiling. Lifestyle   Maintain a healthy weight. Ask your health care provider what weight is healthy for you. If you need to lose weight, work with your health care provider to do so safely.  Exercise for at least 30 minutes on 5 or more days each week, or as told by your health care provider.  Avoid wearing clothes that fit tightly around your waist and chest.  Do not use any products that contain nicotine or tobacco, such as cigarettes and e-cigarettes. If you need help quitting, ask your health care provider.  Sleep with the head of your bed raised. Use a wedge under the mattress or blocks under the bed frame to raise the head of the bed. What foods are not recommended? The items listed may not be a complete  list. Talk with your dietitian about what dietary choices are best for you. Grains Pastries or quick breads with added fat. French toast. Vegetables Deep fried vegetables. French fries. Any vegetables prepared with added fat. Any vegetables that cause symptoms. For some people this may include tomatoes and tomato products, chili peppers, onions and garlic, and horseradish. Fruits Any fruits prepared with added fat. Any fruits that cause symptoms. For some people this may include citrus fruits, such as oranges, grapefruit, pineapple, and lemons. Meats and other protein foods High-fat meats, such as fatty beef or pork, hot dogs, ribs, ham, sausage, salami and bacon. Fried meat or protein, including fried fish and fried chicken. Nuts and nut butters. Dairy Whole milk and chocolate milk. Sour cream. Cream. Ice cream. Cream cheese. Milk shakes. Beverages Coffee and tea, with or without caffeine. Carbonated beverages. Sodas. Energy drinks. Fruit juice made with acidic fruits (such as orange or grapefruit). Tomato juice. Alcoholic drinks. Fats and oils Butter. Margarine. Shortening. Ghee. Sweets and desserts Chocolate and cocoa. Donuts. Seasoning and other foods Pepper. Peppermint and spearmint. Any condiments, herbs, or seasonings that cause symptoms. For some people, this may include curry, hot sauce, or vinegar-based salad dressings. Summary  When you have gastroesophageal reflux disease (GERD), food and lifestyle choices are very important to help ease the discomfort of GERD.  Eat frequent, small meals instead of three large meals each day. Eat your meals slowly, in a relaxed setting. Avoid bending over or lying down until 2-3 hours after eating.  Limit high-fat   foods such as fatty meat or fried foods. This information is not intended to replace advice given to you by your health care provider. Make sure you discuss any questions you have with your health care provider. Document Released:  03/08/2005 Document Revised: 03/09/2016 Document Reviewed: 03/09/2016 Elsevier Interactive Patient Education  2017 [place].  Consider elevate  Head of bed 5 to 6 inches.

## 2016-12-22 ENCOUNTER — Telehealth: Payer: Self-pay | Admitting: *Deleted

## 2016-12-22 NOTE — Telephone Encounter (Signed)
Patient called requesting a referral to [facility] for weight loss.

## 2016-12-22 NOTE — Telephone Encounter (Signed)
I discussed with her (in office earlier this week).  Not sure they would likely proceed b/o her lung cancer hx.  I would suggest she check with them first to see if they would consider given her stage of lung cancer.

## 2016-12-24 NOTE — Telephone Encounter (Signed)
Left message on machine for patient to return our call 

## 2016-12-28 NOTE — Telephone Encounter (Signed)
LMOM for patient to contact office.

## 2016-12-30 ENCOUNTER — Other Ambulatory Visit: Payer: Self-pay | Admitting: Family Medicine

## 2017-01-03 ENCOUNTER — Telehealth: Payer: Self-pay | Admitting: Medical Oncology

## 2017-01-03 NOTE — Telephone Encounter (Signed)
requests appt change to afternoon. Schedule message sent

## 2017-01-05 ENCOUNTER — Other Ambulatory Visit: Payer: Self-pay | Admitting: Family Medicine

## 2017-01-05 DIAGNOSIS — L219 Seborrheic dermatitis, unspecified: Secondary | ICD-10-CM

## 2017-01-07 NOTE — Telephone Encounter (Signed)
Apolonio Schneiders this is dr Elease Hashimoto patient

## 2017-01-10 ENCOUNTER — Telehealth: Payer: Self-pay | Admitting: *Deleted

## 2017-01-10 ENCOUNTER — Telehealth: Payer: Self-pay

## 2017-01-10 NOTE — Telephone Encounter (Signed)
Pt called requesting appt on 10/24 to be moved to 10/30 as she does not have transportation. Message to scheduling.

## 2017-01-10 NOTE — Telephone Encounter (Signed)
Spoke with patient concerning her request for appointment date change. To 30th of October. Per 10/22 sch message.

## 2017-01-12 ENCOUNTER — Other Ambulatory Visit: Payer: PRIVATE HEALTH INSURANCE

## 2017-01-12 ENCOUNTER — Other Ambulatory Visit: Payer: Self-pay | Admitting: Internal Medicine

## 2017-01-12 ENCOUNTER — Ambulatory Visit: Payer: PRIVATE HEALTH INSURANCE | Admitting: Internal Medicine

## 2017-01-12 ENCOUNTER — Ambulatory Visit: Payer: PRIVATE HEALTH INSURANCE

## 2017-01-12 DIAGNOSIS — C3490 Malignant neoplasm of unspecified part of unspecified bronchus or lung: Secondary | ICD-10-CM

## 2017-01-17 ENCOUNTER — Other Ambulatory Visit: Payer: PRIVATE HEALTH INSURANCE

## 2017-01-18 ENCOUNTER — Encounter: Payer: Self-pay | Admitting: Internal Medicine

## 2017-01-18 ENCOUNTER — Ambulatory Visit (HOSPITAL_BASED_OUTPATIENT_CLINIC_OR_DEPARTMENT_OTHER): Payer: Medicare Other

## 2017-01-18 ENCOUNTER — Ambulatory Visit (HOSPITAL_BASED_OUTPATIENT_CLINIC_OR_DEPARTMENT_OTHER): Payer: Medicare Other | Admitting: Internal Medicine

## 2017-01-18 ENCOUNTER — Other Ambulatory Visit (HOSPITAL_BASED_OUTPATIENT_CLINIC_OR_DEPARTMENT_OTHER): Payer: Medicare Other

## 2017-01-18 DIAGNOSIS — C7951 Secondary malignant neoplasm of bone: Secondary | ICD-10-CM

## 2017-01-18 DIAGNOSIS — E663 Overweight: Secondary | ICD-10-CM

## 2017-01-18 DIAGNOSIS — I10 Essential (primary) hypertension: Secondary | ICD-10-CM

## 2017-01-18 DIAGNOSIS — C3491 Malignant neoplasm of unspecified part of right bronchus or lung: Secondary | ICD-10-CM

## 2017-01-18 DIAGNOSIS — G893 Neoplasm related pain (acute) (chronic): Secondary | ICD-10-CM

## 2017-01-18 DIAGNOSIS — C3490 Malignant neoplasm of unspecified part of unspecified bronchus or lung: Secondary | ICD-10-CM

## 2017-01-18 DIAGNOSIS — Z5111 Encounter for antineoplastic chemotherapy: Secondary | ICD-10-CM

## 2017-01-18 LAB — COMPREHENSIVE METABOLIC PANEL
ALT: 15 U/L (ref 0–55)
AST: 19 U/L (ref 5–34)
Albumin: 3.4 g/dL — ABNORMAL LOW (ref 3.5–5.0)
Alkaline Phosphatase: 86 U/L (ref 40–150)
Anion Gap: 9 mEq/L (ref 3–11)
BUN: 15.5 mg/dL (ref 7.0–26.0)
CALCIUM: 9.3 mg/dL (ref 8.4–10.4)
CHLORIDE: 104 meq/L (ref 98–109)
CO2: 30 meq/L — AB (ref 22–29)
CREATININE: 0.9 mg/dL (ref 0.6–1.1)
EGFR: >60 mL/min/{1.73_m2} (ref >60)
GLUCOSE: 108 mg/dL (ref 70–140)
POTASSIUM: 3.6 meq/L (ref 3.5–5.1)
SODIUM: 142 meq/L (ref 136–145)
Total Bilirubin: 0.47 mg/dL (ref 0.20–1.20)
Total Protein: 7.5 g/dL (ref 6.4–8.3)

## 2017-01-18 LAB — CBC WITH DIFFERENTIAL/PLATELET
BASO%: 0.8 % (ref 0.0–2.0)
BASOS ABS: 0.1 10*3/uL (ref 0.0–0.1)
EOS%: 3.3 % (ref 0.0–7.0)
Eosinophils Absolute: 0.2 10*3/uL (ref 0.0–0.5)
HEMATOCRIT: 34.6 % — AB (ref 34.8–46.6)
HGB: 11.1 g/dL — ABNORMAL LOW (ref 11.6–15.9)
LYMPH#: 2.2 10*3/uL (ref 0.9–3.3)
LYMPH%: 32.5 % (ref 14.0–49.7)
MCH: 29.9 pg (ref 25.1–34.0)
MCHC: 32.2 g/dL (ref 31.5–36.0)
MCV: 92.9 fL (ref 79.5–101.0)
MONO#: 0.4 10*3/uL (ref 0.1–0.9)
MONO%: 6.4 % (ref 0.0–14.0)
NEUT#: 3.9 10*3/uL (ref 1.5–6.5)
NEUT%: 57 % (ref 38.4–76.8)
Platelets: 288 10*3/uL (ref 145–400)
RBC: 3.73 10*6/uL (ref 3.70–5.45)
RDW: 15.7 % — AB (ref 11.2–14.5)
WBC: 6.8 10*3/uL (ref 3.9–10.3)

## 2017-01-18 MED ORDER — OXYCODONE HCL 10 MG PO TABS: 10.0000 mg | ORAL_TABLET | Freq: Four times a day (QID) | ORAL | 0 refills | Status: DC | PRN

## 2017-01-18 MED ORDER — DENOSUMAB 120 MG/1.7ML ~~LOC~~ SOLN
120.0000 mg | Freq: Once | SUBCUTANEOUS | Status: AC
  Administered 2017-01-18: 120 mg via SUBCUTANEOUS
  Filled 2017-01-18: qty 1.7

## 2017-01-18 MED FILL — oxyCODONE HCL 10 MG TABS: 10 | 23 days supply | Qty: 90 | Fill #0

## 2017-01-18 NOTE — Progress Notes (Signed)
[facility] [phone]   [phone]  OFFICE PROGRESS NOTE  Beth Post, MD [place] [address]  DIAGNOSIS: Metastatic non-small cell lung cancer, adenocarcinoma with positive EGFR mutation in exon 19 diagnosed in June 2013  PRIOR THERAPY: Palliative radiotherapy to the right lung under the care of Dr. Valere Dross completed on 10/29/2011.  CURRENT THERAPY: 1) [name] 150 mg by mouth daily started 10/11/2011, status [name] 60 months of treatment. 2) Xgeva 120 mg subcutaneously every 8 weeks.  INTERVAL HISTORY: Beth [name] 56 y.o. female returns to the clinic today for follow-up visit. The patient is feeling fine today with no specific complaints except for mild fatigue. She denied having any chest pain but continues to have shortness breath with exertion was no cough or hemoptysis. She denied having any fever or chills. She has no nausea, vomiting, diarrhea or constipation. She is tolerating her current treatment with [name] fairly well. She is here today for evaluation and repeat blood work.  MEDICAL HISTORY: Past Medical History:  Diagnosis Date  . Anxiety   . Arthritis of knee, degenerative   . Asthma   . Complication of anesthesia    difficulty waking up  . Depression   . Endocarditis    as teenager  . Fibromyalgia   . Flu-like symptoms 05/12/2016  . GERD (gastroesophageal reflux disease)   . Heart murmur   . History of radiation therapy 09/29/11-11/04/2011   right lung 2700cGy 15 sessions  . Hypertension   . Lung mass    R- ADENOCARCINOMA  . Osseous metastasis ([facility]) 09/20/11   per PET scan  . Pleural effusion 08/30/11  . Shortness of breath   . Sleep apnea    no longer using CPAP    ALLERGIES:  is allergic to meloxicam.  MEDICATIONS:  Current Outpatient Prescriptions  Medication Sig Dispense Refill  . albuterol-ipratropium (COMBIVENT) 18-103 MCG/ACT inhaler Inhale 1 puff into the lungs every 6  (six) hours as needed for wheezing or shortness of breath.    Marland Kitchen amLODipine (NORVASC) 5 MG tablet TAKE 1 TABLET BY MOUTH EVERY DAY 90 tablet 0  . COMBIVENT RESPIMAT 20-100 MCG/ACT AERS respimat INHALE 1 PUFF INTO THE LUNGS EVERY 6 HOURS AS NEEDED FOR WHEEZING 4 g 0  . diphenhydrAMINE (BENADRYL) 25 MG tablet Take 25 mg by mouth every 6 (six) hours as needed.    . diphenoxylate-atropine (LOMOTIL) 2.5-0.025 MG tablet TAKE 2 TABLETS BY MOUTH FOUR TIMES DAILY AS NEEDED FOR DIARRHEA OR LOOSE STOOL. ALTERNATE WITH IMMODIUM. MAX OF 8 TABLETS PER DAY 30 tablet 0  . ergocalciferol (VITAMIN D2) 50000 units capsule Take 1 capsule (50,000 Units total) by mouth once a week. 12 capsule 3  . famotidine (PEPCID) 20 MG tablet One after bfast and after supper 60 tablet 2  . fish oil-omega-3 fatty acids 1000 MG capsule Take 2 g by mouth daily.    . furosemide (LASIX) 20 MG tablet TAKE 1 TABLET BY MOUTH DAILY AS NEEDED FOR SEVERE EDEMA 30 tablet 0  . hydrochlorothiazide (HYDRODIURIL) 25 MG tablet TAKE 1 TABLET BY MOUTH EVERY DAY 90 tablet 2  . ibuprofen (ADVIL,MOTRIN) 800 MG tablet TAKE 1 TABLET(800 MG) BY MOUTH DAILY AS NEEDED 30 tablet 0  . ketoconazole (NIZORAL) 2 % shampoo APPLY EXTERNALLY 2 TIMES A WEEK 120 mL 0  . loperamide (IMODIUM) 2 MG capsule Take 2 mg by mouth 4 (four) times daily as needed for diarrhea or loose stools.    Marland Kitchen  mirtazapine (REMERON) 30 MG tablet TAKE 1 TABLET(30 MG) BY MOUTH AT BEDTIME 30 tablet 2  . mometasone (NASONEX) 50 MCG/ACT nasal spray Place 2 sprays into the nose daily. 17 g 12  . Multiple Vitamins-Minerals (MULTIVITAMIN WITH MINERALS) tablet Take 1 tablet by mouth daily.    . Oxycodone HCl 10 MG TABS Take 1 tablet (10 mg total) by mouth every 6 (six) hours as needed. 90 tablet 0  . PREMARIN vaginal cream Place 1 application vaginally 2 (two) times daily.  5  . Probiotic Product (ALIGN PO) Take 1 capsule by mouth daily.    . prochlorperazine (COMPAZINE) 10 MG tablet TAKE ONE TABLET BY  MOUTH EVERY 6 HOURS AS NEEDED 385 tablet 0  . [name] 150 MG tablet TAKE 1 TABLET BY MOUTH EVERY DAY ON AN EMPTY STOMACH 1 HOUR BEFORE OR 2 HOURS AFTER A MEAL 30 tablet 0  . triamcinolone (KENALOG) 0.025 % cream APPLY EXTERNALLY TO THE AFFECTED AREA TWICE DAILY 30 g 0  . cyclobenzaprine (FLEXERIL) 10 MG tablet TAKE 1 TABLET BY MOUTH THREE TIMES DAILY AS NEEDED FOR MUSCLE SPASMS. AVOID REGULAR USE (Patient not taking: Reported on 01/18/2017) 30 tablet 0  . mupirocin ointment (BACTROBAN) 2 % APPLY INTO EACH NOSTRIL TWICE DAILY (Patient not taking: Reported on 01/18/2017) 22 g 0  . NONFORMULARY OR COMPOUNDED ITEM Cbcd, cmp,ua   Dx hx fever, hx endocarditis, 1 each 0   No current facility-administered medications for this visit.     SURGICAL HISTORY:  Past Surgical History:  Procedure Laterality Date  . THORACENTESIS  09/02/11, 09/09/11   right-side pleural effusion  . TUBAL LIGATION  2002    REVIEW OF SYSTEMS:  A comprehensive review of systems was negative except for: Constitutional: positive for fatigue Respiratory: positive for dyspnea on exertion   PHYSICAL EXAMINATION: General appearance: alert, cooperative, fatigued and no distress Head: Normocephalic, without obvious abnormality, atraumatic Neck: no adenopathy, no JVD, supple, symmetrical, trachea midline and thyroid not enlarged, symmetric, no tenderness/mass/nodules Lymph nodes: Cervical, supraclavicular, and axillary nodes normal. Resp: clear to auscultation bilaterally Back: symmetric, no curvature. ROM normal. No CVA tenderness. Cardio: regular rate and rhythm, S1, S2 normal, no murmur, click, rub or gallop GI: soft, non-tender; bowel sounds normal; no masses,  no organomegaly Extremities: extremities normal, atraumatic, no cyanosis or edema  ECOG PERFORMANCE STATUS: 1 - Symptomatic but completely ambulatory  Blood pressure 132/86, pulse (!) 116, temperature 98.1 F (36.7 C), temperature source Oral, resp. rate (!) 24, height  '5\' 5"'  (1.651 m), weight 290 lb 8 oz (131.8 kg), last menstrual period 09/28/2011, SpO2 92 %.  LABORATORY DATA: Lab Results  Component Value Date   WBC 6.8 01/18/2017   HGB 11.1 (L) 01/18/2017   HCT 34.6 (L) 01/18/2017   MCV 92.9 01/18/2017   PLT 288 01/18/2017      Chemistry      Component Value Date/Time   NA 142 01/18/2017 0913   K 3.6 01/18/2017 0913   CL 100 (L) 09/28/2016 1505   CL 104 08/18/2012 1154   CO2 30 (H) 01/18/2017 0913   BUN 15.5 01/18/2017 0913   CREATININE 0.9 01/18/2017 0913      Component Value Date/Time   CALCIUM 9.3 01/18/2017 0913   ALKPHOS 86 01/18/2017 0913   AST 19 01/18/2017 0913   ALT 15 01/18/2017 0913   BILITOT 0.47 01/18/2017 0913       RADIOGRAPHIC STUDIES: No results found.  ASSESSMENT AND PLAN:  This is a very pleasant  56 years old African-American female with stage IV non-small cell lung cancer, adenocarcinoma with positive EGFR mutation in exon 19 diagnosed in June 2013. The patient is currently on treatment with [name] 150 mg by mouth daily status [name] 60 months of treatment. The patient is feeling fine today with no specific complaints and continues to tolerate her treatment well. I recommended for her to continue [name] with the same dose. I will see her back for follow-up visit in one month's for reevaluation with repeat CT scan of the chest, abdomen and pelvis for restaging of her disease. For the metastatic bone disease, she is currently on Xgeva every 8 weeks. For pain management, I gave the patient refill of oxycodone. She was advised to call immediately if she has any concerning symptoms in the interval. The patient voices understanding of current disease status and treatment options and is in agreement with the current care plan. All questions were answered. The patient knows to call the clinic with any problems, questions or concerns. We can certainly see the patient much sooner if necessary. I spent 10 minutes counseling  the patient face to face. The total time spent in the appointment was 15 minutes.  Disclaimer: This note was dictated with voice recognition software. Similar sounding words can inadvertently be transcribed and may not be corrected upon review.

## 2017-01-19 ENCOUNTER — Telehealth: Payer: Self-pay | Admitting: Internal Medicine

## 2017-01-19 ENCOUNTER — Telehealth: Payer: Self-pay | Admitting: Medical Oncology

## 2017-01-19 ENCOUNTER — Encounter: Payer: Self-pay | Admitting: *Deleted

## 2017-01-19 DIAGNOSIS — C3490 Malignant neoplasm of unspecified part of unspecified bronchus or lung: Secondary | ICD-10-CM

## 2017-01-19 NOTE — Telephone Encounter (Signed)
Per Julien Nordmann I told pt that he was not going to prescribe antibiotic over the phone. I offered an appt to come in to be seen in [facility]. The patient hung up phone.

## 2017-01-19 NOTE — Telephone Encounter (Signed)
Pt called back about handicap placard. I told her I mailed.

## 2017-01-19 NOTE — Telephone Encounter (Signed)
Scheduled appt per 10/30 los - sent reminder letter in the mail [facility] radiology to contact patient with ct schedule.

## 2017-01-22 ENCOUNTER — Ambulatory Visit (INDEPENDENT_AMBULATORY_CARE_PROVIDER_SITE_OTHER): Payer: Medicare Other | Admitting: Family Medicine

## 2017-01-22 ENCOUNTER — Encounter: Payer: Self-pay | Admitting: Family Medicine

## 2017-01-22 VITALS — BP 116/76 | HR 111 | Temp 98.2°F | Ht 65.0 in | Wt 287.0 lb

## 2017-01-22 DIAGNOSIS — B350 Tinea barbae and tinea capitis: Secondary | ICD-10-CM

## 2017-01-22 DIAGNOSIS — L299 Pruritus, unspecified: Secondary | ICD-10-CM

## 2017-01-22 MED ORDER — HYDROXYZINE HCL 10 MG PO TABS: 10.0000 mg | ORAL_TABLET | Freq: Three times a day (TID) | ORAL | 0 refills | Status: DC | PRN

## 2017-01-22 MED ORDER — DOXYCYCLINE HYCLATE 100 MG PO CAPS: 100.0000 mg | ORAL_CAPSULE | Freq: Two times a day (BID) | ORAL | 0 refills | Status: DC

## 2017-01-22 MED ORDER — TERBINAFINE HCL 250 MG PO TABS: 250.0000 mg | ORAL_TABLET | Freq: Every day | ORAL | 0 refills | Status: DC

## 2017-01-22 NOTE — Progress Notes (Signed)
[facility] [address] [place], [place] [address] [phone]  Date:  01/22/2017   Name:  Beth [name]   DOB:  Nov 16, 1960   MRN:  [id number]  PCP:  [name], MD    Chief Complaint: No chief complaint on file.   History of Present Illness:  Beth [name] is a 56 y.o. very pleasant female patient who presents with the following:  Pt of Dr. Elease Hashimoto- history of MRSA, lung cancer, HTN.  Here today with abuot 2 weeks of scalp itching and inflammation Per Dr. Worthy Flank last note: This is a very pleasant 56 years old African-American female with stage IV non-small cell lung cancer, adenocarcinoma with positive EGFR mutation in exon 29 diagnosed in June 2013. The patient is currently on treatment with [name] 150 mg by mouth daily status post 60 months of treatment. The patient is feeling fine today with no specific complaints and continues to tolerate her treatment well. I recommended for her to continue [name] with the same dose. I will see her back for follow-up visit in one month's for reevaluation with repeat CT scan of the chest, abdomen and pelvis for restaging of her disease. For the metastatic bone disease, she is currently on Xgeva every 8 weeks. For pain management, I gave the patient refill of oxycodone.  She is using kenalog cream and some sort of OTC -I think T gel, also ketoconazole shampoo per Dr. Elease Hashimoto  No fever or other systemic sx.  She is really just bothered by the itchiness of her head.  No chills, nausea, vomiting or body ache  Pulse Readings from Last 3 Encounters:  01/22/17 (!) 111  01/18/17 (!) 116  12/21/16 100   She is generally tachycardic so her pulse today is not unusual Recent labs reassuring   Lab Results  Component Value Date   WBC 6.8 01/18/2017   HGB 11.1 (L) 01/18/2017   HCT 34.6 (L) 01/18/2017   MCV 92.9 01/18/2017   PLT 288 01/18/2017    Patient Active  Problem List   Diagnosis Date Noted  . Flu-like symptoms 05/12/2016  . Hx of bacterial endocarditis 02/28/2016  . Acute upper respiratory infection 02/28/2016  . Morbid (severe) obesity due to excess calories ([facility]) 01/20/2016  . Cough variant asthma vs UACS 12/09/2015  . Encounter for antineoplastic chemotherapy 01/06/2015  . History of MRSA infection 11/13/2014  . Sinusitis 09/11/2014  . Elevated brain natriuretic peptide (BNP) level 10/09/2011  . Stage IV adenocarcinoma of lung with right lung collapse 10/05/2011  . Pleural effusion 09/01/2011  . Lung mass 08/31/2011  . Anxiety 07/12/2011  . Vitamin D deficiency 06/23/2011  . Normocytic anemia 06/23/2011  . Asthma, mild intermittent 04/12/2011  . History of depression 01/21/2011  . Overweight 12/05/2009  . Essential hypertension, benign 12/05/2009  . GERD 12/05/2009  . MURMUR 12/05/2009    Past Medical History:  Diagnosis Date  . Anxiety   . Arthritis of knee, degenerative   . Asthma   . Complication of anesthesia    difficulty waking up  . Depression   . Endocarditis    as teenager  . Fibromyalgia   . Flu-like symptoms 05/12/2016  . GERD (gastroesophageal reflux disease)   . Heart murmur   . History of radiation therapy 09/29/11-11/04/2011   right lung 2700cGy 15 sessions  . Hypertension   . Lung mass    R- ADENOCARCINOMA  . Osseous metastasis ([facility]) 09/20/11  per PET scan  . Pleural effusion 08/30/11  . Shortness of breath   . Sleep apnea    no longer using CPAP    Past Surgical History:  Procedure Laterality Date  . THORACENTESIS  09/02/11, 09/09/11   right-side pleural effusion  . TUBAL LIGATION  2002    Social History  Substance Use Topics  . Smoking status: Former Smoker    Packs/day: 0.50    Years: 15.00    Types: Cigarettes    Quit date: 01/01/2011  . Smokeless tobacco: Never Used  . Alcohol use No     Comment: per H&P, used to drink alcohol regularly    Family History  Problem Relation Age of  Onset  . Alcohol abuse Father   . Hypertension Mother   . Hypertension Maternal Aunt   . Hypertension Maternal Uncle   . Hypertension Maternal Grandmother   . Hypertension Maternal Grandfather   . Sarcoidosis Sister     Allergies  Allergen Reactions  . Meloxicam     Possible GI bleed    Medication list has been reviewed and updated.  Current Outpatient Prescriptions on File Prior to Visit  Medication Sig Dispense Refill  . albuterol-ipratropium (COMBIVENT) 18-103 MCG/ACT inhaler Inhale 1 puff into the lungs every 6 (six) hours as needed for wheezing or shortness of breath.    Marland Kitchen amLODipine (NORVASC) 5 MG tablet TAKE 1 TABLET BY MOUTH EVERY DAY 90 tablet 0  . COMBIVENT RESPIMAT 20-100 MCG/ACT AERS respimat INHALE 1 PUFF INTO THE LUNGS EVERY 6 HOURS AS NEEDED FOR WHEEZING 4 g 0  . diphenhydrAMINE (BENADRYL) 25 MG tablet Take 25 mg by mouth every 6 (six) hours as needed.    . diphenoxylate-atropine (LOMOTIL) 2.5-0.025 MG tablet TAKE 2 TABLETS BY MOUTH FOUR TIMES DAILY AS NEEDED FOR DIARRHEA OR LOOSE STOOL. ALTERNATE WITH IMMODIUM. MAX OF 8 TABLETS PER DAY 30 tablet 0  . ergocalciferol (VITAMIN D2) 50000 units capsule Take 1 capsule (50,000 Units total) by mouth once a week. 12 capsule 3  . fish oil-omega-3 fatty acids 1000 MG capsule Take 2 g by mouth daily.    . furosemide (LASIX) 20 MG tablet TAKE 1 TABLET BY MOUTH DAILY AS NEEDED FOR SEVERE EDEMA 30 tablet 0  . hydrochlorothiazide (HYDRODIURIL) 25 MG tablet TAKE 1 TABLET BY MOUTH EVERY DAY 90 tablet 2  . ibuprofen (ADVIL,MOTRIN) 800 MG tablet TAKE 1 TABLET(800 MG) BY MOUTH DAILY AS NEEDED 30 tablet 0  . ketoconazole (NIZORAL) 2 % shampoo APPLY EXTERNALLY 2 TIMES A WEEK 120 mL 0  . loperamide (IMODIUM) 2 MG capsule Take 2 mg by mouth 4 (four) times daily as needed for diarrhea or loose stools.    . mirtazapine (REMERON) 30 MG tablet TAKE 1 TABLET(30 MG) BY MOUTH AT BEDTIME 30 tablet 2  . mometasone (NASONEX) 50 MCG/ACT nasal spray  Place 2 sprays into the nose daily. 17 g 12  . Multiple Vitamins-Minerals (MULTIVITAMIN WITH MINERALS) tablet Take 1 tablet by mouth daily.    . mupirocin ointment (BACTROBAN) 2 % APPLY INTO EACH NOSTRIL TWICE DAILY 22 g 0  . NONFORMULARY OR COMPOUNDED ITEM Cbcd, cmp,ua   Dx hx fever, hx endocarditis, 1 each 0  . Oxycodone HCl 10 MG TABS Take 1 tablet (10 mg total) by mouth every 6 (six) hours as needed. 90 tablet 0  . PREMARIN vaginal cream Place 1 application vaginally 2 (two) times daily.  5  . Probiotic Product (ALIGN PO) Take 1 capsule by mouth  daily.    . prochlorperazine (COMPAZINE) 10 MG tablet TAKE ONE TABLET BY MOUTH EVERY 6 HOURS AS NEEDED 385 tablet 0  . [name] 150 MG tablet TAKE 1 TABLET BY MOUTH EVERY DAY ON AN EMPTY STOMACH 1 HOUR BEFORE OR 2 HOURS AFTER A MEAL 30 tablet 0  . triamcinolone (KENALOG) 0.025 % cream APPLY EXTERNALLY TO THE AFFECTED AREA TWICE DAILY 30 g 0  . cyclobenzaprine (FLEXERIL) 10 MG tablet TAKE 1 TABLET BY MOUTH THREE TIMES DAILY AS NEEDED FOR MUSCLE SPASMS. AVOID REGULAR USE (Patient not taking: Reported on 01/22/2017) 30 tablet 0  . famotidine (PEPCID) 20 MG tablet One after bfast and after supper (Patient not taking: Reported on 01/22/2017) 60 tablet 2   No current facility-administered medications on file prior to visit.     Review of Systems:  As per HPI- otherwise negative.    Physical Examination: Vitals:   01/22/17 0852  BP: 116/76  Pulse: (!) 111  Temp: 98.2 F (36.8 C)  SpO2: 94%   Vitals:   01/22/17 0852  Weight: 287 lb (130.2 kg)  Height: '5\' 5"'  (1.651 m)   Body mass index is 47.76 kg/m. Ideal Body Weight: Weight in (lb) to have BMI = 25: 149.9  GEN: WDWN, NAD, Non-toxic, A & O x 3, obese, looks well   HEENT: Atraumatic, Normocephalic. Neck supple. No masses, No LAD.  Bilateral TM wnl, oropharynx normal.  PEERL,EOMI.   Ears and Nose: No external deformity. CV: RRR, No M/G/R. No JVD. No thrill. No extra heart sounds. PULM: CTA  B, no wheezes, crackles, rhonchi. No retractions. No resp. distress. No accessory muscle use. ABD: S, NT, ND, +BS. No rebound. No HSM. EXTR: No c/c/e NEURO Normal gait.  PSYCH: Normally interactive. Conversant. Not depressed or anxious appearing.  Calm demeanor.  Scalp shows mild widespread inflammation.  Not spongy or grossly infected in appearance,  Mild scaliness    Assessment and Plan: Fungal scalp infection - Plan: doxycycline (VIBRAMYCIN) 100 MG capsule, terbinafine (LAMISIL) 250 MG tablet  Scalp itch - Plan: hydrOXYzine (ATARAX/VISTARIL) 10 MG tablet  Here today with a likely fungal scalp infection, which I suspect now has some bacterial superinfection Treat with doxycycline and lamisil by mouth.   She has a derm appt next week and will follow-up with them  Vistaril to use prn for itching  Meds ordered this encounter  Medications  . doxycycline (VIBRAMYCIN) 100 MG capsule    Sig: Take 1 capsule (100 mg total) by mouth 2 (two) times daily.    Dispense:  20 capsule    Refill:  0  . terbinafine (LAMISIL) 250 MG tablet    Sig: Take 1 tablet (250 mg total) by mouth daily.    Dispense:  14 tablet    Refill:  0  . hydrOXYzine (ATARAX/VISTARIL) 10 MG tablet    Sig: Take 1 tablet (10 mg total) by mouth 3 (three) times daily as needed for itching.    Dispense:  30 tablet    Refill:  0     Signed Lamar Blinks, MD

## 2017-01-22 NOTE — Patient Instructions (Addendum)
We are going to treat your scalp with  An antibiotic doxycycline.  Take twice a day for 10 days An antifungal- terbinafine. Take daily for 14 days Use the atarax as needed for itching- however, this medication can make you sleepy!  Do not combine with benadryl or remeron  Please be sure to see your dermatologist on thursday, and let us know if any worsening in the meantime

## 2017-01-24 ENCOUNTER — Telehealth: Payer: Self-pay | Admitting: Family Medicine

## 2017-01-24 MED ORDER — HYDROXYZINE HCL 25 MG PO TABS: ORAL_TABLET | ORAL | 0 refills | Status: DC

## 2017-01-24 NOTE — Telephone Encounter (Signed)
They make a 25 mg - but if she already has the 10 mg dose, I would start by increasing to two 10 mg tablets every 6 hours prn itching.

## 2017-01-24 NOTE — Telephone Encounter (Signed)
Pt saw dr copland on sat and was prescribed hydroxine 10 mg for scalp itching and pt would like next mg. [facility]

## 2017-01-24 NOTE — Telephone Encounter (Signed)
Sent to PCP for approval for increased dose.

## 2017-01-24 NOTE — Telephone Encounter (Signed)
Rx sent. Patient is aware. Med list updated.

## 2017-01-25 ENCOUNTER — Telehealth: Payer: Self-pay | Admitting: *Deleted

## 2017-01-25 NOTE — Telephone Encounter (Signed)
Patient left voice mail that she will call back next week to reschedule her consent visit for pathology trial and lab appointment. She has an outbreak of what she thinks could be shingles and is seeing dermatologist. She will call back to reschedule. Mauri Reading Michaelle Copas Therapist, sports, BSN Clinical Research Nurse 01/24/17 @ [phone]

## 2017-01-26 ENCOUNTER — Ambulatory Visit: Payer: Medicare Other | Admitting: Family Medicine

## 2017-01-27 ENCOUNTER — Other Ambulatory Visit: Payer: Medicare Other

## 2017-01-27 ENCOUNTER — Encounter: Payer: Medicare Other | Admitting: *Deleted

## 2017-01-27 DIAGNOSIS — L218 Other seborrheic dermatitis: Secondary | ICD-10-CM | POA: Diagnosis not present

## 2017-02-11 ENCOUNTER — Telehealth: Payer: Self-pay | Admitting: *Deleted

## 2017-02-11 ENCOUNTER — Encounter: Payer: Self-pay | Admitting: *Deleted

## 2017-02-11 NOTE — Telephone Encounter (Signed)
Pt called with request for a letter stating she has lung cancer as she would like to present this to [employer]. Reviewed with MD. Letter will be ready for pick up at pt's next appt.

## 2017-02-14 ENCOUNTER — Telehealth: Payer: Self-pay | Admitting: Medical Oncology

## 2017-02-14 ENCOUNTER — Other Ambulatory Visit: Payer: Self-pay | Admitting: Medical Oncology

## 2017-02-14 ENCOUNTER — Encounter: Payer: Self-pay | Admitting: Medical Oncology

## 2017-02-14 DIAGNOSIS — C3490 Malignant neoplasm of unspecified part of unspecified bronchus or lung: Secondary | ICD-10-CM

## 2017-02-14 DIAGNOSIS — I10 Essential (primary) hypertension: Secondary | ICD-10-CM

## 2017-02-14 DIAGNOSIS — Z5111 Encounter for antineoplastic chemotherapy: Secondary | ICD-10-CM

## 2017-02-14 MED ORDER — OXYCODONE HCL 10 MG PO TABS: 10.0000 mg | ORAL_TABLET | Freq: Four times a day (QID) | ORAL | 0 refills | Status: DC | PRN

## 2017-02-14 NOTE — Telephone Encounter (Signed)
Asking to pick up refill on Thursday and letter. Called pt to pick up.

## 2017-02-17 ENCOUNTER — Ambulatory Visit (HOSPITAL_COMMUNITY)
Admission: RE | Admit: 2017-02-17 | Discharge: 2017-02-17 | Disposition: A | Discharge status: Home / Self Care | Payer: Medicare Other | Source: Ambulatory Visit | Attending: Internal Medicine | Admitting: Internal Medicine

## 2017-02-17 ENCOUNTER — Other Ambulatory Visit (HOSPITAL_BASED_OUTPATIENT_CLINIC_OR_DEPARTMENT_OTHER): Payer: Medicare Other

## 2017-02-17 ENCOUNTER — Encounter: Payer: Medicare Other | Admitting: *Deleted

## 2017-02-17 DIAGNOSIS — C3491 Malignant neoplasm of unspecified part of right bronchus or lung: Secondary | ICD-10-CM

## 2017-02-17 DIAGNOSIS — C3490 Malignant neoplasm of unspecified part of unspecified bronchus or lung: Secondary | ICD-10-CM | POA: Diagnosis not present

## 2017-02-17 DIAGNOSIS — J439 Emphysema, unspecified: Secondary | ICD-10-CM | POA: Diagnosis not present

## 2017-02-17 DIAGNOSIS — K469 Unspecified abdominal hernia without obstruction or gangrene: Secondary | ICD-10-CM | POA: Diagnosis not present

## 2017-02-17 DIAGNOSIS — I10 Essential (primary) hypertension: Secondary | ICD-10-CM | POA: Insufficient documentation

## 2017-02-17 DIAGNOSIS — I700 Atherosclerosis of aorta: Secondary | ICD-10-CM | POA: Diagnosis not present

## 2017-02-17 DIAGNOSIS — Z5111 Encounter for antineoplastic chemotherapy: Secondary | ICD-10-CM

## 2017-02-17 DIAGNOSIS — C7951 Secondary malignant neoplasm of bone: Secondary | ICD-10-CM | POA: Insufficient documentation

## 2017-02-17 LAB — COMPREHENSIVE METABOLIC PANEL
ALBUMIN: 3.5 g/dL (ref 3.5–5.0)
ALT: 15 U/L (ref 0–55)
AST: 19 U/L (ref 5–34)
Alkaline Phosphatase: 80 U/L (ref 40–150)
Anion Gap: 10 mEq/L (ref 3–11)
BUN: 14.3 mg/dL (ref 7.0–26.0)
CO2: 29 mEq/L (ref 22–29)
CREATININE: 0.8 mg/dL (ref 0.6–1.1)
Calcium: 9 mg/dL (ref 8.4–10.4)
Chloride: 103 mEq/L (ref 98–109)
GLUCOSE: 103 mg/dL (ref 70–140)
Potassium: 3.6 mEq/L (ref 3.5–5.1)
SODIUM: 142 meq/L (ref 136–145)
TOTAL PROTEIN: 7.6 g/dL (ref 6.4–8.3)
Total Bilirubin: 0.51 mg/dL (ref 0.20–1.20)

## 2017-02-17 LAB — CBC WITH DIFFERENTIAL/PLATELET
BASO%: 1.3 % (ref 0.0–2.0)
BASOS ABS: 0.1 10*3/uL (ref 0.0–0.1)
EOS ABS: 0.2 10*3/uL (ref 0.0–0.5)
EOS%: 3.7 % (ref 0.0–7.0)
HCT: 35.3 % (ref 34.8–46.6)
HEMOGLOBIN: 11.3 g/dL — AB (ref 11.6–15.9)
LYMPH%: 35 % (ref 14.0–49.7)
MCH: 29.1 pg (ref 25.1–34.0)
MCHC: 32 g/dL (ref 31.5–36.0)
MCV: 90.7 fL (ref 79.5–101.0)
MONO#: 0.5 10*3/uL (ref 0.1–0.9)
MONO%: 9.3 % (ref 0.0–14.0)
NEUT#: 2.6 10*3/uL (ref 1.5–6.5)
NEUT%: 50.7 % (ref 38.4–76.8)
PLATELETS: 285 10*3/uL (ref 145–400)
RBC: 3.89 10*6/uL (ref 3.70–5.45)
RDW: 15.2 % — ABNORMAL HIGH (ref 11.2–14.5)
WBC: 5.2 10*3/uL (ref 3.9–10.3)
lymph#: 1.8 10*3/uL (ref 0.9–3.3)

## 2017-02-17 LAB — RESEARCH LABS

## 2017-02-17 MED ORDER — IOPAMIDOL (ISOVUE-300) INJECTION 61%: 100.0000 mL | Freq: Once | INTRAVENOUS | Status: DC | PRN

## 2017-02-17 MED ORDER — IOPAMIDOL (ISOVUE-300) INJECTION 61%
INTRAVENOUS | Status: AC
  Filled 2017-02-17: qty 100

## 2017-02-17 MED FILL — oxyCODONE HCL 10 MG TABS: 10 | 23 days supply | Qty: 90 | Fill #0

## 2017-02-18 ENCOUNTER — Other Ambulatory Visit: Payer: Medicare Other

## 2017-02-20 ENCOUNTER — Other Ambulatory Visit: Payer: Self-pay | Admitting: Internal Medicine

## 2017-02-20 DIAGNOSIS — C3490 Malignant neoplasm of unspecified part of unspecified bronchus or lung: Secondary | ICD-10-CM

## 2017-02-21 ENCOUNTER — Ambulatory Visit: Payer: Medicare Other | Admitting: Internal Medicine

## 2017-03-07 ENCOUNTER — Other Ambulatory Visit: Payer: Self-pay | Admitting: Family Medicine

## 2017-03-07 DIAGNOSIS — C3491 Malignant neoplasm of unspecified part of right bronchus or lung: Secondary | ICD-10-CM

## 2017-03-09 ENCOUNTER — Ambulatory Visit: Payer: PRIVATE HEALTH INSURANCE

## 2017-03-14 ENCOUNTER — Other Ambulatory Visit: Payer: Self-pay | Admitting: Family Medicine

## 2017-03-21 ENCOUNTER — Other Ambulatory Visit: Payer: Self-pay | Admitting: Medical Oncology

## 2017-03-21 ENCOUNTER — Ambulatory Visit (HOSPITAL_BASED_OUTPATIENT_CLINIC_OR_DEPARTMENT_OTHER): Payer: Medicare Other | Admitting: Internal Medicine

## 2017-03-21 ENCOUNTER — Ambulatory Visit: Payer: PRIVATE HEALTH INSURANCE

## 2017-03-21 ENCOUNTER — Ambulatory Visit (HOSPITAL_BASED_OUTPATIENT_CLINIC_OR_DEPARTMENT_OTHER): Payer: Medicare Other

## 2017-03-21 ENCOUNTER — Encounter: Payer: Self-pay | Admitting: Internal Medicine

## 2017-03-21 VITALS — BP 139/88 | HR 129 | Temp 98.2°F | Resp 18 | Ht 65.0 in | Wt 277.6 lb

## 2017-03-21 DIAGNOSIS — Z5111 Encounter for antineoplastic chemotherapy: Secondary | ICD-10-CM

## 2017-03-21 DIAGNOSIS — C3490 Malignant neoplasm of unspecified part of unspecified bronchus or lung: Secondary | ICD-10-CM

## 2017-03-21 DIAGNOSIS — I10 Essential (primary) hypertension: Secondary | ICD-10-CM

## 2017-03-21 DIAGNOSIS — C3491 Malignant neoplasm of unspecified part of right bronchus or lung: Secondary | ICD-10-CM | POA: Diagnosis not present

## 2017-03-21 DIAGNOSIS — C7951 Secondary malignant neoplasm of bone: Secondary | ICD-10-CM

## 2017-03-21 DIAGNOSIS — G893 Neoplasm related pain (acute) (chronic): Secondary | ICD-10-CM

## 2017-03-21 LAB — COMPREHENSIVE METABOLIC PANEL
ALBUMIN: 3.9 g/dL (ref 3.5–5.0)
ALK PHOS: 95 U/L (ref 40–150)
ALT: 21 U/L (ref 0–55)
AST: 25 U/L (ref 5–34)
Anion Gap: 12 mEq/L — ABNORMAL HIGH (ref 3–11)
BUN: 12.1 mg/dL (ref 7.0–26.0)
CHLORIDE: 102 meq/L (ref 98–109)
CO2: 25 meq/L (ref 22–29)
Calcium: 9.3 mg/dL (ref 8.4–10.4)
Creatinine: 0.9 mg/dL (ref 0.6–1.1)
EGFR: >60 mL/min/{1.73_m2} (ref >60)
GLUCOSE: 107 mg/dL (ref 70–140)
POTASSIUM: 3.6 meq/L (ref 3.5–5.1)
SODIUM: 139 meq/L (ref 136–145)
Total Bilirubin: 1.2 mg/dL (ref 0.20–1.20)
Total Protein: 8.2 g/dL (ref 6.4–8.3)

## 2017-03-21 LAB — CBC WITH DIFFERENTIAL/PLATELET
BASO%: 1.2 % (ref 0.0–2.0)
BASOS ABS: 0.1 10*3/uL (ref 0.0–0.1)
EOS%: 1.8 % (ref 0.0–7.0)
Eosinophils Absolute: 0.1 10*3/uL (ref 0.0–0.5)
HCT: 38.4 % (ref 34.8–46.6)
HEMOGLOBIN: 12.6 g/dL (ref 11.6–15.9)
LYMPH%: 26 % (ref 14.0–49.7)
MCH: 29.3 pg (ref 25.1–34.0)
MCHC: 32.7 g/dL (ref 31.5–36.0)
MCV: 89.5 fL (ref 79.5–101.0)
MONO#: 0.4 10*3/uL (ref 0.1–0.9)
MONO%: 5.8 % (ref 0.0–14.0)
NEUT%: 65.2 % (ref 38.4–76.8)
NEUTROS ABS: 4.3 10*3/uL (ref 1.5–6.5)
Platelets: 273 10*3/uL (ref 145–400)
RBC: 4.3 10*6/uL (ref 3.70–5.45)
RDW: 16 % — AB (ref 11.2–14.5)
WBC: 6.5 10*3/uL (ref 3.9–10.3)
lymph#: 1.7 10*3/uL (ref 0.9–3.3)

## 2017-03-21 MED ORDER — OXYCODONE HCL 10 MG PO TABS: 10.0000 mg | ORAL_TABLET | Freq: Four times a day (QID) | ORAL | 0 refills | Status: DC | PRN

## 2017-03-21 MED ORDER — DENOSUMAB 120 MG/1.7ML ~~LOC~~ SOLN
SUBCUTANEOUS | Status: AC
  Filled 2017-03-21: qty 1.7

## 2017-03-21 MED FILL — oxyCODONE HCL 10 MG TABS: 10 | 23 days supply | Qty: 90 | Fill #0

## 2017-03-21 NOTE — Progress Notes (Signed)
[facility] Telephone:([phone]) [phone]   [phone]) [phone]  OFFICE PROGRESS NOTE  [name], MD [place] [address]  DIAGNOSIS: Metastatic non-small cell lung cancer, adenocarcinoma with positive EGFR mutation in exon 19 diagnosed in June 2013  PRIOR THERAPY: Palliative radiotherapy to the right lung under the care of Dr. Valere Dross completed on 10/29/2011.  CURRENT THERAPY: 1) [name] 150 mg by mouth daily started 10/11/2011, status post 62 months of treatment. 2) Xgeva 120 mg subcutaneously every 8 weeks.  INTERVAL HISTORY: Beth [name] 56 y.o. female returns to the clinic today for follow-up visit.  The patient is feeling fine today with no specific complaints.  The patient continues to have dry skin especially on the scalp.  She denied having any recent chest pain but continues to have shortness of breath with exertion with mild cough and no hemoptysis.  She has no nausea, vomiting or constipation.  She had intermittent diarrhea improved with Imodium.  The patient denied having any fever or chills.  She continues to tolerate her treatment with [name] fairly well.  She has been on this treatment for more than 5 years now.  She had repeat CT scan of the chest, abdomen and pelvis last month and she is here for evaluation and discussion of her scan results and recommendation regarding her condition.  MEDICAL HISTORY: Past Medical History:  Diagnosis Date  . Anxiety   . Arthritis of knee, degenerative   . Asthma   . Complication of anesthesia    difficulty waking up  . Depression   . Endocarditis    as teenager  . Fibromyalgia   . Flu-like symptoms 05/12/2016  . GERD (gastroesophageal reflux disease)   . Heart murmur   . History of radiation therapy 09/29/11-11/04/2011   right lung 2700cGy 15 sessions  . Hypertension   . Lung mass    R- ADENOCARCINOMA  . Osseous metastasis ([place]) 09/20/11   per PET scan  . Pleural effusion 08/30/11   . Shortness of breath   . Sleep apnea    no longer using CPAP    ALLERGIES:  is allergic to meloxicam.  MEDICATIONS:  Current Outpatient Medications  Medication Sig Dispense Refill  . albuterol-ipratropium (COMBIVENT) 18-103 MCG/ACT inhaler Inhale 1 puff into the lungs every 6 (six) hours as needed for wheezing or shortness of breath.    Marland Kitchen amLODipine (NORVASC) 5 MG tablet TAKE 1 TABLET BY MOUTH EVERY DAY 90 tablet 1  . COMBIVENT RESPIMAT 20-100 MCG/ACT AERS respimat INHALE 1 PUFF INTO THE LUNGS EVERY 6 HOURS AS NEEDED FOR WHEEZING 4 g 0  . cyclobenzaprine (FLEXERIL) 10 MG tablet TAKE 1 TABLET BY MOUTH THREE TIMES DAILY AS NEEDED FOR MUSCLE SPASMS. AVOID REGULAR USE (Patient not taking: Reported on 01/22/2017) 30 tablet 0  . diphenhydrAMINE (BENADRYL) 25 MG tablet Take 25 mg by mouth every 6 (six) hours as needed.    . diphenoxylate-atropine (LOMOTIL) 2.5-0.025 MG tablet TAKE 2 TABLETS BY MOUTH FOUR TIMES DAILY AS NEEDED FOR DIARRHEA OR LOOSE STOOL. ALTERNATE WITH IMMODIUM. MAX OF 8 TABLETS PER DAY 30 tablet 0  . doxycycline (VIBRAMYCIN) 100 MG capsule Take 1 capsule (100 mg total) by mouth 2 (two) times daily. 20 capsule 0  . ergocalciferol (VITAMIN D2) 50000 units capsule Take 1 capsule (50,000 Units total) by mouth once a week. 12 capsule 3  . famotidine (PEPCID) 20 MG tablet One after bfast and after supper (Patient not taking: Reported on  01/22/2017) 60 tablet 2  . fish oil-omega-3 fatty acids 1000 MG capsule Take 2 g by mouth daily.    . furosemide (LASIX) 20 MG tablet TAKE 1 TABLET BY MOUTH DAILY AS NEEDED FOR SEVERE EDEMA 30 tablet 0  . hydrochlorothiazide (HYDRODIURIL) 25 MG tablet TAKE 1 TABLET BY MOUTH EVERY DAY 90 tablet 0  . hydrOXYzine (ATARAX/VISTARIL) 25 MG tablet One tablet every 6 hours as needed for itching 30 tablet 0  . ibuprofen (ADVIL,MOTRIN) 800 MG tablet TAKE 1 TABLET(800 MG) BY MOUTH DAILY AS NEEDED 30 tablet 0  . ketoconazole (NIZORAL) 2 % shampoo APPLY EXTERNALLY 2  TIMES A WEEK 120 mL 0  . loperamide (IMODIUM) 2 MG capsule Take 2 mg by mouth 4 (four) times daily as needed for diarrhea or loose stools.    . mirtazapine (REMERON) 30 MG tablet TAKE 1 TABLET(30 MG) BY MOUTH AT BEDTIME 30 tablet 2  . mometasone (NASONEX) 50 MCG/ACT nasal spray Place 2 sprays into the nose daily. 17 g 12  . Multiple Vitamins-Minerals (MULTIVITAMIN WITH MINERALS) tablet Take 1 tablet by mouth daily.    . mupirocin ointment (BACTROBAN) 2 % APPLY INTO EACH NOSTRIL TWICE DAILY 22 g 0  . NONFORMULARY OR COMPOUNDED ITEM Cbcd, cmp,ua   Dx hx fever, hx endocarditis, 1 each 0  . Oxycodone HCl 10 MG TABS Take 1 tablet (10 mg total) by mouth every 6 (six) hours as needed. 90 tablet 0  . PREMARIN vaginal cream Place 1 application vaginally 2 (two) times daily.  5  . Probiotic Product (ALIGN PO) Take 1 capsule by mouth daily.    . prochlorperazine (COMPAZINE) 10 MG tablet TAKE ONE TABLET BY MOUTH EVERY 6 HOURS AS NEEDED 385 tablet 0  . [name] 150 MG tablet TAKE 1 TABLET BY MOUTH EVERY DAY ON AN EMPTY STOMACH 1 HOUR BEFORE OR 2 HOURS AFTER A MEAL 30 tablet 0  . terbinafine (LAMISIL) 250 MG tablet Take 1 tablet (250 mg total) by mouth daily. 14 tablet 0  . triamcinolone (KENALOG) 0.025 % cream APPLY EXTERNALLY TO THE AFFECTED AREA TWICE DAILY 30 g 0   No current facility-administered medications for this visit.     SURGICAL HISTORY:  Past Surgical History:  Procedure Laterality Date  . THORACENTESIS  09/02/11, 09/09/11   right-side pleural effusion  . TUBAL LIGATION  2002    REVIEW OF SYSTEMS:  Constitutional: negative Eyes: negative Ears, nose, mouth, throat, and face: negative Respiratory: positive for cough and dyspnea on exertion Cardiovascular: negative Gastrointestinal: negative Genitourinary:negative Integument/breast: negative Hematologic/lymphatic: negative Musculoskeletal:negative Neurological: negative Behavioral/Psych: negative Endocrine:  negative Allergic/Immunologic: negative   PHYSICAL EXAMINATION: General appearance: alert, cooperative and no distress Head: Normocephalic, without obvious abnormality, atraumatic Neck: no adenopathy, no JVD, supple, symmetrical, trachea midline and thyroid not enlarged, symmetric, no tenderness/mass/nodules Lymph nodes: Cervical, supraclavicular, and axillary nodes normal. Resp: clear to auscultation bilaterally Back: symmetric, no curvature. ROM normal. No CVA tenderness. Cardio: regular rate and rhythm, S1, S2 normal, no murmur, click, rub or gallop GI: soft, non-tender; bowel sounds normal; no masses,  no organomegaly Extremities: extremities normal, atraumatic, no cyanosis or edema Neurologic: Alert and oriented X 3, normal strength and tone. Normal symmetric reflexes. Normal coordination and gait  ECOG PERFORMANCE STATUS: 1 - Symptomatic but completely ambulatory  Blood pressure 139/88, pulse (!) 129, temperature 98.2 F (36.8 C), temperature source Oral, resp. rate 18, height '5\' 5"'  (1.651 m), weight 277 lb 9.6 oz (125.9 kg), last menstrual period 09/28/2011, SpO2 95 %.  LABORATORY DATA: Lab Results  Component Value Date   WBC 5.2 02/17/2017   HGB 11.3 (L) 02/17/2017   HCT 35.3 02/17/2017   MCV 90.7 02/17/2017   PLT 285 02/17/2017      Chemistry      Component Value Date/Time   NA 142 02/17/2017 0952   K 3.6 02/17/2017 0952   CL 100 (L) 09/28/2016 1505   CL 104 08/18/2012 1154   CO2 29 02/17/2017 0952   BUN 14.3 02/17/2017 0952   CREATININE 0.8 02/17/2017 0952      Component Value Date/Time   CALCIUM 9.0 02/17/2017 0952   ALKPHOS 80 02/17/2017 0952   AST 19 02/17/2017 0952   ALT 15 02/17/2017 0952   BILITOT 0.51 02/17/2017 0952       RADIOGRAPHIC STUDIES: Ct Chest W Contrast  Result Date: 02/17/2017 CLINICAL DATA:  Metastatic non-small cell lung cancer restaging and assessment of response to therapy EXAM: CT CHEST, ABDOMEN, AND PELVIS WITH CONTRAST  TECHNIQUE: Multidetector CT imaging of the chest, abdomen and pelvis was performed following the standard protocol during bolus administration of intravenous contrast. CONTRAST:  100 cc Isovue-300 COMPARISON:  Multiple exams, including 10/29/2016 FINDINGS: CT CHEST FINDINGS Cardiovascular: Atherosclerotic calcification of the aortic arch. Notable calcification along the left fibrous trigone and anterior cusp of the left mitral valve. Mediastinum/Nodes: Rightward deviation of the mediastinum with a large Morgagni hernia containing much of the transverse colon, ascending colon, and some of the terminal ileum and appendix as well as omental adipose tissue. No pathologic thoracic adenopathy is identified. Lungs/Pleura: Stable prominent pleural thickening on the right, with atelectasis of nearly the entire right middle lobe and atelectasis involving much of the right lower lobe. Nodularity with marginal calcification along the right middle lobe/ right lower lobe measuring 1.6 by 2.4 cm, previously the same. The left upper lobe region of ground-glass opacity measures up to 2.5 by 1.0 cm on image 46/4, previously the same by my measurement. No new lesion. Emphysema noted. Musculoskeletal: Widespread and essentially diffuse sclerotic osseous metastatic disease, no appreciable change in appearance or distribution within thorax. CT ABDOMEN PELVIS FINDINGS Hepatobiliary: 8 mm hypodense lesion at the junction of the right and left hepatic lobes on image 53/2, stable and likely benign. Mild prominence of the inferior portion of the right hepatic lobe, stable. Gallbladder unremarkable. No biliary dilatation. Pancreas: Unremarkable Spleen: Unremarkable Adrenals/Urinary Tract: Medial displacement of the right kidney by the somewhat enlarged right hepatic lobe. Kidneys appear otherwise unremarkable. Urinary bladder unremarkable. Adrenal glands normal. Stomach/Bowel: As noted above, there is displacement of the cecum, ascending  colon, and proximal transverse colon as well as the appendix into the mark acne hernia. Part of the appendix extends down back into the abdomen, extending along the hepatic margin. No bowel inflammation or findings of obstruction. Mild sigmoid colon diverticulosis. Vascular/Lymphatic: Aortoiliac atherosclerotic vascular disease. No pathologic adenopathy. Reproductive: Unremarkable Other: No supplemental non-categorized findings. Musculoskeletal: Diffuse osseous sclerotic metastatic disease. IMPRESSION: 1. The clustered region of ground-glass opacities in the left upper lobe appears stable. Given the persistence, surveillance to exclude low-grade adenocarcinoma is recommended. 2. No change in the prominent right hemithoracic volume loss, especially involving the lower and middle lobes, with associated atelectasis, pleural thickening, anterior nodularity, and underlying emphysema. 3. 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 This is similar to prior and contributes to the volume loss in the right hemithorax. 4.  Stable diffuse sclerotic osseous metastatic disease. 5. Aortic Atherosclerosis (ICD10-I70.0) and Emphysema (ICD10-J43.9). 6. Stable prominent contour of the right hepatic lobe likely contributing to some of the medial deviation of the right kidney. Electronically Signed   By: Van Clines M.D.   On: 02/17/2017 14:30   Ct Abdomen Pelvis W Contrast  Result Date: 02/17/2017 CLINICAL DATA:  Metastatic non-small cell lung cancer restaging and assessment of response to therapy EXAM: CT CHEST, ABDOMEN, AND PELVIS WITH CONTRAST TECHNIQUE: Multidetector CT imaging of the chest, abdomen and pelvis was performed following the standard protocol during bolus administration of intravenous contrast. CONTRAST:  100 cc Isovue-300 COMPARISON:  Multiple exams, including 10/29/2016  FINDINGS: CT CHEST FINDINGS Cardiovascular: Atherosclerotic calcification of the aortic arch. Notable calcification along the left fibrous trigone and anterior cusp of the left mitral valve. Mediastinum/Nodes: Rightward deviation of the mediastinum with a large Morgagni hernia containing much of the transverse colon, ascending colon, and some of the terminal ileum and appendix as well as omental adipose tissue. No pathologic thoracic adenopathy is identified. Lungs/Pleura: Stable prominent pleural thickening on the right, with atelectasis of nearly the entire right middle lobe and atelectasis involving much of the right lower lobe. Nodularity with marginal calcification along the right middle lobe/ right lower lobe measuring 1.6 by 2.4 cm, previously the same. The left upper lobe region of ground-glass opacity measures up to 2.5 by 1.0 cm on image 46/4, previously the same by my measurement. No new lesion. Emphysema noted. Musculoskeletal: Widespread and essentially diffuse sclerotic osseous metastatic disease, no appreciable change in appearance or distribution within thorax. CT ABDOMEN PELVIS FINDINGS Hepatobiliary: 8 mm hypodense lesion at the junction of the right and left hepatic lobes on image 53/2, stable and likely benign. Mild prominence of the inferior portion of the right hepatic lobe, stable. Gallbladder unremarkable. No biliary dilatation. Pancreas: Unremarkable Spleen: Unremarkable Adrenals/Urinary Tract: Medial displacement of the right kidney by the somewhat enlarged right hepatic lobe. Kidneys appear otherwise unremarkable. Urinary bladder unremarkable. Adrenal glands normal. Stomach/Bowel: As noted above, there is displacement of the cecum, ascending colon, and proximal transverse colon as well as the appendix into the mark acne hernia. Part of the appendix extends down back into the abdomen, extending along the hepatic margin. No bowel inflammation or findings of obstruction. Mild sigmoid colon  diverticulosis. Vascular/Lymphatic: Aortoiliac atherosclerotic vascular disease. No pathologic adenopathy. Reproductive: Unremarkable Other: No supplemental non-categorized findings. Musculoskeletal: Diffuse osseous sclerotic metastatic disease. IMPRESSION: 1. The clustered region of ground-glass opacities in the left upper lobe appears stable. Given the persistence, surveillance to exclude low-grade adenocarcinoma is recommended. 2. No change in the prominent right hemithoracic volume loss, especially involving the lower and middle lobes, with associated atelectasis, pleural thickening, anterior nodularity, and underlying emphysema. 3. 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 This is similar to prior and contributes to the volume loss in the right hemithorax. 4. Stable diffuse sclerotic osseous metastatic disease. 5. Aortic Atherosclerosis (ICD10-I70.0) and Emphysema (ICD10-J43.9). 6. Stable prominent contour of the right hepatic lobe likely contributing to some of the medial deviation of the right kidney. Electronically Signed   By: Van Clines M.D.   On: 02/17/2017 14:30   ASSESSMENT AND PLAN:  This is a very pleasant 56 years old African-American female with stage IV non-small cell lung cancer, adenocarcinoma with positive EGFR mutation in exon 19 diagnosed in June 2013. The patient is  currently on treatment with [name] 150 mg by mouth daily status post 62 months of treatment. The patient continues to tolerate this treatment very well with no concerning complaints. She had repeat CT scan of the chest, abdomen and pelvis performed last month that showed no evidence for disease progression.  The scan showed very large Morgagni hernia which is probably explaining some of her shortness of breath. I discussed the scan results with the patient today. I recommended  for her to continue her current treatment with [name]. I will see her back for follow-up visit in 2 months for evaluation and repeat blood work. For the large Morgagni hernia, I would refer the patient to surgery for evaluation and recommendation regarding this condition. For pain management the patient was given refill of oxycodone. For the metastatic bone disease, she would continue on Xgeva every 8 weeks. She was advised to call immediately if she has any concerning symptoms in the interval. The patient voices understanding of current disease status and treatment options and is in agreement with the current care plan. All questions were answered. The patient knows to call the clinic with any problems, questions or concerns. We can certainly see the patient much sooner if necessary.  Disclaimer: This note was dictated with voice recognition software. Similar sounding words can inadvertently be transcribed and may not be corrected upon review.

## 2017-03-24 ENCOUNTER — Telehealth: Payer: Self-pay | Admitting: Internal Medicine

## 2017-03-24 NOTE — Telephone Encounter (Signed)
Scheduled appt per 12/31 sch message - patient is aware of apt date and time.

## 2017-03-24 NOTE — Telephone Encounter (Signed)
Patient unable to come in this week - request for appt to be mid next week .,

## 2017-03-26 ENCOUNTER — Other Ambulatory Visit: Payer: Self-pay | Admitting: Family Medicine

## 2017-03-28 ENCOUNTER — Other Ambulatory Visit: Payer: Self-pay | Admitting: Internal Medicine

## 2017-03-28 DIAGNOSIS — C3490 Malignant neoplasm of unspecified part of unspecified bronchus or lung: Secondary | ICD-10-CM

## 2017-03-28 NOTE — Telephone Encounter (Signed)
Clarify if she has been taking this all along.  If she has been off this completely for several months, we would have to start back at lower dose.

## 2017-03-28 NOTE — Telephone Encounter (Signed)
The source prescription was discontinued on 07/12/2016 by Curt Bears, MD.  Faythe Ghee to fill?

## 2017-03-29 NOTE — Telephone Encounter (Signed)
Refill for one year 

## 2017-03-29 NOTE — Telephone Encounter (Signed)
Patient states that she has been Effexor 75 mg for about a month now.  Okay to refill?

## 2017-03-30 ENCOUNTER — Ambulatory Visit: Payer: PRIVATE HEALTH INSURANCE

## 2017-03-30 ENCOUNTER — Telehealth: Payer: Self-pay | Admitting: Internal Medicine

## 2017-03-30 MED ORDER — DENOSUMAB 120 MG/1.7ML ~~LOC~~ SOLN
SUBCUTANEOUS | Status: AC
  Filled 2017-03-30: qty 1.7

## 2017-03-30 NOTE — Telephone Encounter (Signed)
Called to reschedule her appointment due to a rash

## 2017-04-01 ENCOUNTER — Ambulatory Visit: Payer: PRIVATE HEALTH INSURANCE

## 2017-04-01 MED ORDER — DENOSUMAB 120 MG/1.7ML ~~LOC~~ SOLN
SUBCUTANEOUS | Status: AC
  Filled 2017-04-01: qty 1.7

## 2017-04-05 ENCOUNTER — Ambulatory Visit (HOSPITAL_COMMUNITY): Payer: Medicare Other | Admitting: Psychiatry

## 2017-04-12 ENCOUNTER — Telehealth: Payer: Self-pay | Admitting: Family Medicine

## 2017-04-12 NOTE — Telephone Encounter (Signed)
Is this new?  We have not evaluated her for that. Probably should see here first- in order for referral to be covered.

## 2017-04-12 NOTE — Telephone Encounter (Signed)
Copied from [place] [id number]. Topic: Referral - Request >> Apr 12, 2017  4:22 PM Ether Griffins B wrote: Reason for CRM: requesting referral for hernia repair anywhere other than [facility].   I left a voice message for patient to call back and schedule a office visit. This will be needed before a referral can be placed.

## 2017-04-14 ENCOUNTER — Telehealth: Payer: Self-pay | Admitting: Internal Medicine

## 2017-04-14 ENCOUNTER — Telehealth: Payer: Self-pay | Admitting: *Deleted

## 2017-04-14 MED ORDER — CLINDAMYCIN PHOSPHATE 1 % EX SOLN: Freq: Two times a day (BID) | CUTANEOUS | 0 refills | Status: DC

## 2017-04-14 NOTE — Telephone Encounter (Signed)
Pt called states " I have a rash on my neck, chest, face and arms. I think I need to be seen sooner than 2/18." Reviewed with MD, pt to be seen by [facility], Rx for Clindimycin to pt pharmacy

## 2017-04-14 NOTE — Telephone Encounter (Signed)
Returned phone call after she left [place] a voicemail - confirmed appt with her and added injection per standing order.

## 2017-04-15 ENCOUNTER — Other Ambulatory Visit: Payer: Self-pay | Admitting: Family Medicine

## 2017-04-15 ENCOUNTER — Other Ambulatory Visit: Payer: Self-pay | Admitting: *Deleted

## 2017-04-15 DIAGNOSIS — R197 Diarrhea, unspecified: Secondary | ICD-10-CM

## 2017-04-15 DIAGNOSIS — L299 Pruritus, unspecified: Secondary | ICD-10-CM

## 2017-04-15 MED ORDER — DIPHENOXYLATE-ATROPINE 2.5-0.025 MG PO TABS: ORAL_TABLET | ORAL | 0 refills | Status: DC

## 2017-04-15 MED ORDER — CLINDAMYCIN PHOSPHATE 1 % EX SOLN: Freq: Two times a day (BID) | CUTANEOUS | 2 refills | Status: DC

## 2017-04-15 NOTE — Telephone Encounter (Signed)
Pt called advised she has had diarrhea all morning (x6 episodes), has tried imodium with no results. Reviewed with Erasmo Downer NP VO Lomotil called into pharmacy

## 2017-04-15 NOTE — Progress Notes (Signed)
Lomotil called into pt pharmacy.

## 2017-04-17 ENCOUNTER — Other Ambulatory Visit: Payer: Self-pay | Admitting: Family Medicine

## 2017-04-17 DIAGNOSIS — L299 Pruritus, unspecified: Secondary | ICD-10-CM

## 2017-04-18 ENCOUNTER — Telehealth: Payer: Self-pay | Admitting: Medical Oncology

## 2017-04-18 ENCOUNTER — Other Ambulatory Visit: Payer: Self-pay | Admitting: Medical Oncology

## 2017-04-18 DIAGNOSIS — C3491 Malignant neoplasm of unspecified part of right bronchus or lung: Secondary | ICD-10-CM

## 2017-04-18 DIAGNOSIS — L270 Generalized skin eruption due to drugs and medicaments taken internally: Secondary | ICD-10-CM

## 2017-04-18 DIAGNOSIS — Z5111 Encounter for antineoplastic chemotherapy: Secondary | ICD-10-CM

## 2017-04-18 MED ORDER — CLINDAMYCIN PHOSPHATE 1 % EX SOLN: Freq: Two times a day (BID) | CUTANEOUS | 0 refills | Status: AC

## 2017-04-18 NOTE — Telephone Encounter (Signed)
Ok to give her refill for 240 ml.

## 2017-04-18 NOTE — Telephone Encounter (Signed)
Try OTC Hydrocotisone cream if rash persisted. May need to see Lucianne Lei if no improvement.

## 2017-04-18 NOTE — Telephone Encounter (Signed)
States one bottle of clindamycin ( 60 ml) is only lasting her 3 days ( 2 week supply ordered 1/25)

## 2017-04-18 NOTE — Telephone Encounter (Signed)
Reports Rash on face ,upper arms, middle arms neck and chest.  Then states 'it is pretty much all gone now but if I don't keep using the clindamycin  the rash will come back-  I need more than 60 ml"   Per Dr Julien Nordmann, I told pt to  " Try OTC Hydrocotisone cream if rash persisted. May need to see Lucianne Lei if no improvement". Pt  said at one time he ordered a box of the clindamycin in the past and  hydrocortisone does not work.  She said she will only come in  if she is seeing Dr Julien Nordmann.

## 2017-04-20 ENCOUNTER — Other Ambulatory Visit: Payer: Self-pay | Admitting: Medical Oncology

## 2017-04-20 ENCOUNTER — Ambulatory Visit: Payer: Medicare Other | Admitting: Family Medicine

## 2017-04-20 DIAGNOSIS — Z5111 Encounter for antineoplastic chemotherapy: Secondary | ICD-10-CM

## 2017-04-20 DIAGNOSIS — I10 Essential (primary) hypertension: Secondary | ICD-10-CM

## 2017-04-20 DIAGNOSIS — C3490 Malignant neoplasm of unspecified part of unspecified bronchus or lung: Secondary | ICD-10-CM

## 2017-04-20 MED ORDER — OXYCODONE HCL 10 MG PO TABS: 10.0000 mg | ORAL_TABLET | Freq: Four times a day (QID) | ORAL | 0 refills | Status: DC | PRN

## 2017-04-21 MED FILL — oxyCODONE HCL 10 MG TABS: 10 | 22 days supply | Qty: 90 | Fill #0

## 2017-04-22 NOTE — Telephone Encounter (Signed)
Received refill request for HYDROXYZINE HCL 10MG  TABLETS. Please advise.

## 2017-05-01 ENCOUNTER — Other Ambulatory Visit: Payer: Self-pay | Admitting: Internal Medicine

## 2017-05-01 DIAGNOSIS — C3490 Malignant neoplasm of unspecified part of unspecified bronchus or lung: Secondary | ICD-10-CM

## 2017-05-02 ENCOUNTER — Ambulatory Visit: Payer: Self-pay | Admitting: Surgery

## 2017-05-02 DIAGNOSIS — Q790 Congenital diaphragmatic hernia: Secondary | ICD-10-CM | POA: Diagnosis not present

## 2017-05-02 DIAGNOSIS — C3491 Malignant neoplasm of unspecified part of right bronchus or lung: Secondary | ICD-10-CM | POA: Diagnosis not present

## 2017-05-02 NOTE — H&P (Signed)
Beth [name] Documented: 05/02/2017 3:35 PM Location: [facility] Patient #: [id number] DOB: 1960/05/16 Widowed / Language: [name] / Race: Black or African American Female  History of Present Illness Adin Hector MD; 05/02/2017 4:31 PM) The patient is a 57 year old female who presents for an evaluation of a hernia. Note for "Hernia": ` ` ` Patient sent for surgical consultation at the request of Dr. 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 Diagnosed June 2013. Quite bulky involving the central region. Markedly reduced with radiation and chemotherapy. Controlled with over 5 years of palliative chemotherapy. [name] PO daily. However has had anterior diaphragmatic hernia with colon going up and the right chest. It is gotten much larger over the past 5 years. Placing over two thirds of the lung volume on that side. Patient has had increasing shortness of breath. Occasional choking symptoms. Occasional nausea as well. She can walk about 5 or 10 minutes before she has to stop. She has not smoked since her diagnosis of lung cancer. She tells me she had a heart murmur but had an echocardiogram with ejection fraction 55-60 percent last March 2018. She remembers getting a diagnostic laparoscopy for abdominal pain when she was a teenager while living in [place]. Was underwhelming. No other abdominal surgery since. She usually moves her bowels about twice a day. Because of her 5 year survival without any strong evidence of recurrence and controlled on stable oral chemotherapy, I felt she would benefit from more aggressive treatment for her worsening diaphragmatic hernia pressing into her right lung.  (Review of systems as stated in this history (HPI) or in the review of systems. Otherwise all other  12 point ROS are negative)   Past Surgical History [name], CMA; 05/02/2017 3:35 PM) Breast Biopsy Right. Cesarean Section - Multiple Laparoscopic Inguinal Hernia Surgery Right. Open Inguinal Hernia Surgery Right. Ventral / Umbilical Hernia Surgery Right.  Diagnostic Studies History [name], CMA; 05/02/2017 3:35 PM) Pap Smear never  Allergies [name], CMA; 05/02/2017 3:37 PM) Meloxicam *ANALGESICS - ANTI-INFLAMMATORY*  Medication History ([name], CMA; 05/02/2017 3:36 PM) [name] (150MG Tablet, Oral) Active. HydroCHLOROthiazide (25MG Tablet, Oral) Active. Venlafaxine HCl ER (75MG Capsule ER 24HR, Oral) Active. Amoxicillin (500MG Capsule, Oral) Active. Medications Reconciled  Social History [name], CMA; 05/02/2017 3:35 PM) Alcohol use Occasional alcohol use. No caffeine use No drug use Tobacco use Former smoker.  Family History [name], CMA; 05/02/2017 3:35 PM) Alcohol Abuse Father. Depression Mother. Hypertension Mother. Respiratory Condition Family Members In General, Sister.  Pregnancy / Birth History [name], CMA; 05/02/2017 3:35 PM) Age at menarche 60 years. Age of menopause 15-50 Gravida 1 Irregular periods Maternal age 85-25 Para 19  Other Problems [name], CMA; 05/02/2017 3:35 PM) Anxiety Disorder Arthritis Depression Gastroesophageal Reflux Disease General anesthesia - complications Heart murmur Hemorrhoids High blood pressure Hypercholesterolemia Lung Cancer Sleep Apnea     Review of Systems ([name] CMA; 05/02/2017 3:35 PM) General Present- Fatigue and Weight Gain. Not Present- Appetite Loss, Chills, Fever, Night Sweats and Weight Loss. Skin Not Present- Change in Wart/Mole, Dryness, Hives, Jaundice, New Lesions, Non-Healing Wounds, Rash and Ulcer. HEENT Present- Visual Disturbances. Not Present- Earache, Hearing Loss, Hoarseness, Nose Bleed, Oral Ulcers,  Ringing in the Ears, Seasonal Allergies, Sinus Pain, Sore Throat, Wears glasses/contact lenses and Yellow Eyes. Respiratory Present- Difficulty Breathing, Snoring and Wheezing.  Not Present- Bloody sputum and Chronic Cough. Cardiovascular Present- Leg Cramps and Shortness of Breath. Not Present- Chest Pain, Difficulty Breathing Lying Down, Palpitations, Rapid Heart Rate and Swelling of Extremities. Gastrointestinal Present- Bloating, Change in Bowel Habits, Difficulty Swallowing, Excessive gas, Gets full quickly at meals, Hemorrhoids and Nausea. Not Present- Abdominal Pain, Bloody Stool, Chronic diarrhea, Constipation, Indigestion, Rectal Pain and Vomiting. Female Genitourinary Not Present- Frequency, Nocturia, Painful Urination, Pelvic Pain and Urgency. Musculoskeletal Present- Joint Pain, Joint Stiffness and Muscle Weakness. Not Present- Back Pain, Muscle Pain and Swelling of Extremities. Neurological Not Present- Decreased Memory, Fainting, Headaches, Numbness, Seizures, Tingling, Tremor, Trouble walking and Weakness. Psychiatric Present- Anxiety, Bipolar and Fearful. Not Present- Change in Sleep Pattern, Depression and Frequent crying. Endocrine Present- Hair Changes, Heat Intolerance and Hot flashes. Not Present- Cold Intolerance, Excessive Hunger and New Diabetes. Hematology Not Present- Blood Thinners, Easy Bruising, Excessive bleeding, Gland problems, HIV and Persistent Infections.  Vitals ([name] CMA; 05/02/2017 3:35 PM) 05/02/2017 3:35 PM Weight: 283.6 lb Height: 65in Body Surface Area: 2.29 m Body Mass Index: 47.19 kg/m  Temp.: 98.40F(Oral)  Pulse: 140 (Regular)  BP: 126/84 (Sitting, Left Arm, Standard)      Physical Exam Adin Hector MD; 05/02/2017 4:33 PM)  General Mental Status-Alert. General Appearance-Not in acute distress, Not Sickly. Orientation-Oriented X3. Hydration-Well hydrated. Voice-Normal. Note: Not cachectic. Morbidly obese but  PEAR shaped body habitus.  Integumentary Global Assessment Upon inspection and palpation of skin surfaces of the - Axillae: non-tender, no inflammation or ulceration, no drainage. and Distribution of scalp and body hair is normal. General Characteristics Temperature - normal warmth is noted.  Head and Neck Head-normocephalic, atraumatic with no lesions or palpable masses. Face Global Assessment - atraumatic, no absence of expression. Neck Global Assessment - no abnormal movements, no bruit auscultated on the right, no bruit auscultated on the left, no decreased range of motion, non-tender. Trachea-midline. Thyroid Gland Characteristics - non-tender.  Eye Eyeball - Left-Extraocular movements intact, No Nystagmus. Eyeball - Right-Extraocular movements intact, No Nystagmus. Cornea - Left-No Hazy. Cornea - Right-No Hazy. Sclera/Conjunctiva - Left-No scleral icterus, No Discharge. Sclera/Conjunctiva - Right-No scleral icterus, No Discharge. Pupil - Left-Direct reaction to light normal. Pupil - Right-Direct reaction to light normal.  ENMT Ears Pinna - Left - no drainage observed, no generalized tenderness observed. Right - no drainage observed, no generalized tenderness observed. Nose and Sinuses External Inspection of the Nose - no destructive lesion observed. Inspection of the nares - Left - quiet respiration. Right - quiet respiration. Mouth and Throat Lips - Upper Lip - no fissures observed, no pallor noted. Lower Lip - no fissures observed, no pallor noted. Nasopharynx - no discharge present. Oral Cavity/Oropharynx - Tongue - no dryness observed. Oral Mucosa - no cyanosis observed. Hypopharynx - no evidence of airway distress observed.  Chest and Lung Exam Inspection Movements - Normal and Symmetrical. Accessory muscles - No use of accessory muscles in breathing. Palpation Palpation of the chest reveals - Non-tender. Auscultation Breath sounds - Normal and  Clear. Note: Decreased breath sounds anteriorly greater than posteriorly on right side. Normal lung sounds on the left side.  Cardiovascular Auscultation Rhythm - Regular. Murmurs & Other Heart Sounds - Auscultation of the heart reveals - No Murmurs and No Systolic Clicks.  Abdomen Inspection Inspection of the abdomen reveals - No Visible peristalsis and No Abnormal pulsations. Umbilicus - No Bleeding, No Urine drainage. Palpation/Percussion Palpation and Percussion of the abdomen reveal - Soft, Non Tender, No Rebound tenderness, No  Rigidity (guarding) and No Cutaneous hyperesthesia. Note: Abdomen obese but soft. Epigastric discomfort to palpation. The rest the abdomen is nontender. Not distended. No distasis recti. No umbilical or other anterior abdominal wall hernias  Female Genitourinary Sexual Maturity Tanner 5 - Adult hair pattern. Note: No vaginal bleeding nor discharge  Peripheral Vascular Upper Extremity Inspection - Left - No Cyanotic nailbeds, Not Ischemic. Right - No Cyanotic nailbeds, Not Ischemic.  Neurologic Neurologic evaluation reveals -normal attention span and ability to concentrate, able to name objects and repeat phrases. Appropriate fund of knowledge , normal sensation and normal coordination. Mental Status Affect - not angry, not paranoid. Cranial Nerves-Normal Bilaterally. Gait-Normal.  Neuropsychiatric Mental status exam performed with findings of-able to articulate well with normal speech/language, rate, volume and coherence, thought content normal with ability to perform basic computations and apply abstract reasoning and no evidence of hallucinations, delusions, obsessions or homicidal/suicidal ideation.  Musculoskeletal Global Assessment Spine, Ribs and Pelvis - no instability, subluxation or laxity. Right Upper Extremity - no instability, subluxation or laxity.  Lymphatic Head & Neck  General Head & Neck Lymphatics: Bilateral -  Description - No Localized lymphadenopathy. Axillary  General Axillary Region: Bilateral - Description - No Localized lymphadenopathy. Femoral & Inguinal  Generalized Femoral & Inguinal Lymphatics: Left - Description - No Localized lymphadenopathy. Right - Description - No Localized lymphadenopathy.    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 Probably 60-70 percent compression of right long. Worsening shortness of breath.  I think she would benefit from reduction and repair of hernia. Primary closure with Blake drain. Mesh reinforcement. Probably can do dual sided ventralite mesh sort of a modified ventral wall/onlay diaphragmatic reinforcement. Discussed with my other partner in advanced minimally invasive surgery, Dr. Rosendo Gros, whom agrees.  Because she is having increasing shortness of breath and has decent expected life expectancy with potential gain in pulmonary function, I offered repair. Robotic versus laparoscopic approach. She is interested in proceeding. We will work to coordinate a convenient time.  Current Plans You are being scheduled for surgery- Our schedulers will call you.  You should hear from our office's scheduling department within 5 working days about the location, date, and time of surgery. We try to make accommodations for patient's preferences in scheduling surgery, but sometimes the OR schedule or the surgeon's schedule prevents [place] from making those accommodations.  If you have not heard from our office [phone]) in 5 working days, call the office and ask for your surgeon's nurse.  If you have other questions about your diagnosis, plan, or surgery, call the office and ask for your surgeon's nurse.  Pt Education - [facility]oscopic [facility] The  anatomy & physiology of the foregut and anti-reflux mechanism was discussed. The pathophysiology of hiatal herniation and GERD was discussed. Natural history risks without surgery was discussed. The patient's symptoms are not adequately controlled by medicines and other non-operative treatments. I feel the risks of no intervention will lead to serious problems that outweigh the operative risks; therefore, I recommended surgery to reduce the hiatal hernia out of the chest and fundoplication to rebuild the anti-reflux valve and control reflux better. Need for a thorough workup to rule out the differential diagnosis and plan treatment was explained. I explained laparoscopic techniques with possible need for an open approach.  Risks such as  bleeding, infection, abscess, leak, need for further treatment, heart attack, death, and other risks were discussed. I noted a good likelihood this will help address the problem. Goals of post-operative recovery were discussed as well. Possibility that this will not correct all symptoms was explained. Post-operative dysphagia, need for short-term liquid & pureed diet, inability to vomit, possibility of reherniation, possible need for medicines to help control symptoms in addition to surgery were discussed. We will work to minimize complications. Educational handouts further explaining the pathology, treatment options, and dysphagia diet was given as well. Questions were answered. The patient expresses understanding & wishes to proceed with surgery.   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  Referred by medical oncology and felt not to be a contraindication to proceeding surgery.  Adin Hector, M.D., F.A.C.S. Gastrointestinal and Minimally Invasive  Surgery [facility] [address], [place], [place] [address] [phone] Main / Paging

## 2017-05-02 NOTE — H&P (View-Only) (Signed)
Beth [name] Documented: 05/02/2017 3:35 PM Location: [facility] Patient #: [id number] DOB: [birth year] Widowed / Language: [name] / Race: Black or African American Female  History of Present Illness Beth Hector MD; 05/02/2017 4:31 PM) The patient is a 57 year old female who presents for an evaluation of a hernia. Note for "Hernia": ` ` ` Patient sent for surgical consultation at the request of Dr. 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 Diagnosed June 2013. Quite bulky involving the central region. Markedly reduced with radiation and chemotherapy. Controlled with over 5 years of palliative chemotherapy. [name] PO daily. However has had anterior diaphragmatic hernia with colon going up and the right chest. It is gotten much larger over the past 5 years. Placing over two thirds of the lung volume on that side. Patient has had increasing shortness of breath. Occasional choking symptoms. Occasional nausea as well. She can walk about 5 or 10 minutes before she has to stop. She has not smoked since her diagnosis of lung cancer. She tells me she had a heart murmur but had an echocardiogram with ejection fraction 55-60 percent last March 2018. She remembers getting a diagnostic laparoscopy for abdominal pain when she was a teenager while living in [place]. Was underwhelming. No other abdominal surgery since. She usually moves her bowels about twice a day. Because of her 5 year survival without any strong evidence of recurrence and controlled on stable oral chemotherapy, I felt she would benefit from more aggressive treatment for her worsening diaphragmatic hernia pressing into her right lung.  (Review of systems as stated in this history (HPI) or in the review of systems. Otherwise all other  12 point ROS are negative)   Past Surgical History [name], CMA; 05/02/2017 3:35 PM) Breast Biopsy Right. Cesarean Section - Multiple Laparoscopic Inguinal Hernia Surgery Right. Open Inguinal Hernia Surgery Right. Ventral / Umbilical Hernia Surgery Right.  Diagnostic Studies History [name], CMA; 05/02/2017 3:35 PM) Pap Smear never  Allergies [name], CMA; 05/02/2017 3:37 PM) Meloxicam *ANALGESICS - ANTI-INFLAMMATORY*  Medication History ([name], CMA; 05/02/2017 3:36 PM) [name] (150MG Tablet, Oral) Active. HydroCHLOROthiazide (25MG Tablet, Oral) Active. Venlafaxine HCl ER (75MG Capsule ER 24HR, Oral) Active. Amoxicillin (500MG Capsule, Oral) Active. Medications Reconciled  Social History [name], CMA; 05/02/2017 3:35 PM) Alcohol use Occasional alcohol use. No caffeine use No drug use Tobacco use Former smoker.  Family History [name], CMA; 05/02/2017 3:35 PM) Alcohol Abuse Father. Depression Mother. Hypertension Mother. Respiratory Condition Family Members In General, Sister.  Pregnancy / Birth History [name], CMA; 05/02/2017 3:35 PM) Age at menarche 75 years. Age of menopause 5-50 Gravida 1 Irregular periods Maternal age 82-25 Para 4  Other Problems [name], CMA; 05/02/2017 3:35 PM) Anxiety Disorder Arthritis Depression Gastroesophageal Reflux Disease General anesthesia - complications Heart murmur Hemorrhoids High blood pressure Hypercholesterolemia Lung Cancer Sleep Apnea     Review of Systems ([name] CMA; 05/02/2017 3:35 PM) General Present- Fatigue and Weight Gain. Not Present- Appetite Loss, Chills, Fever, Night Sweats and Weight Loss. Skin Not Present- Change in Wart/Mole, Dryness, Hives, Jaundice, New Lesions, Non-Healing Wounds, Rash and Ulcer. HEENT Present- Visual Disturbances. Not Present- Earache, Hearing Loss, Hoarseness, Nose Bleed, Oral Ulcers,  Ringing in the Ears, Seasonal Allergies, Sinus Pain, Sore Throat, Wears glasses/contact lenses and Yellow Eyes. Respiratory Present- Difficulty Breathing, Snoring and Wheezing.  Not Present- Bloody sputum and Chronic Cough. Cardiovascular Present- Leg Cramps and Shortness of Breath. Not Present- Chest Pain, Difficulty Breathing Lying Down, Palpitations, Rapid Heart Rate and Swelling of Extremities. Gastrointestinal Present- Bloating, Change in Bowel Habits, Difficulty Swallowing, Excessive gas, Gets full quickly at meals, Hemorrhoids and Nausea. Not Present- Abdominal Pain, Bloody Stool, Chronic diarrhea, Constipation, Indigestion, Rectal Pain and Vomiting. Female Genitourinary Not Present- Frequency, Nocturia, Painful Urination, Pelvic Pain and Urgency. Musculoskeletal Present- Joint Pain, Joint Stiffness and Muscle Weakness. Not Present- Back Pain, Muscle Pain and Swelling of Extremities. Neurological Not Present- Decreased Memory, Fainting, Headaches, Numbness, Seizures, Tingling, Tremor, Trouble walking and Weakness. Psychiatric Present- Anxiety, Bipolar and Fearful. Not Present- Change in Sleep Pattern, Depression and Frequent crying. Endocrine Present- Hair Changes, Heat Intolerance and Hot flashes. Not Present- Cold Intolerance, Excessive Hunger and New Diabetes. Hematology Not Present- Blood Thinners, Easy Bruising, Excessive bleeding, Gland problems, HIV and Persistent Infections.  Vitals ([name] CMA; 05/02/2017 3:35 PM) 05/02/2017 3:35 PM Weight: 283.6 lb Height: 65in Body Surface Area: 2.29 m Body Mass Index: 47.19 kg/m  Temp.: 98.51F(Oral)  Pulse: 140 (Regular)  BP: 126/84 (Sitting, Left Arm, Standard)      Physical Exam Beth Hector MD; 05/02/2017 4:33 PM)  General Mental Status-Alert. General Appearance-Not in acute distress, Not Sickly. Orientation-Oriented X3. Hydration-Well hydrated. Voice-Normal. Note: Not cachectic. Morbidly obese but  PEAR shaped body habitus.  Integumentary Global Assessment Upon inspection and palpation of skin surfaces of the - Axillae: non-tender, no inflammation or ulceration, no drainage. and Distribution of scalp and body hair is normal. General Characteristics Temperature - normal warmth is noted.  Head and Neck Head-normocephalic, atraumatic with no lesions or palpable masses. Face Global Assessment - atraumatic, no absence of expression. Neck Global Assessment - no abnormal movements, no bruit auscultated on the right, no bruit auscultated on the left, no decreased range of motion, non-tender. Trachea-midline. Thyroid Gland Characteristics - non-tender.  Eye Eyeball - Left-Extraocular movements intact, No Nystagmus. Eyeball - Right-Extraocular movements intact, No Nystagmus. Cornea - Left-No Hazy. Cornea - Right-No Hazy. Sclera/Conjunctiva - Left-No scleral icterus, No Discharge. Sclera/Conjunctiva - Right-No scleral icterus, No Discharge. Pupil - Left-Direct reaction to light normal. Pupil - Right-Direct reaction to light normal.  ENMT Ears Pinna - Left - no drainage observed, no generalized tenderness observed. Right - no drainage observed, no generalized tenderness observed. Nose and Sinuses External Inspection of the Nose - no destructive lesion observed. Inspection of the nares - Left - quiet respiration. Right - quiet respiration. Mouth and Throat Lips - Upper Lip - no fissures observed, no pallor noted. Lower Lip - no fissures observed, no pallor noted. Nasopharynx - no discharge present. Oral Cavity/Oropharynx - Tongue - no dryness observed. Oral Mucosa - no cyanosis observed. Hypopharynx - no evidence of airway distress observed.  Chest and Lung Exam Inspection Movements - Normal and Symmetrical. Accessory muscles - No use of accessory muscles in breathing. Palpation Palpation of the chest reveals - Non-tender. Auscultation Breath sounds - Normal and  Clear. Note: Decreased breath sounds anteriorly greater than posteriorly on right side. Normal lung sounds on the left side.  Cardiovascular Auscultation Rhythm - Regular. Murmurs & Other Heart Sounds - Auscultation of the heart reveals - No Murmurs and No Systolic Clicks.  Abdomen Inspection Inspection of the abdomen reveals - No Visible peristalsis and No Abnormal pulsations. Umbilicus - No Bleeding, No Urine drainage. Palpation/Percussion Palpation and Percussion of the abdomen reveal - Soft, Non Tender, No Rebound tenderness, No  Rigidity (guarding) and No Cutaneous hyperesthesia. Note: Abdomen obese but soft. Epigastric discomfort to palpation. The rest the abdomen is nontender. Not distended. No distasis recti. No umbilical or other anterior abdominal wall hernias  Female Genitourinary Sexual Maturity Tanner 5 - Adult hair pattern. Note: No vaginal bleeding nor discharge  Peripheral Vascular Upper Extremity Inspection - Left - No Cyanotic nailbeds, Not Ischemic. Right - No Cyanotic nailbeds, Not Ischemic.  Neurologic Neurologic evaluation reveals -normal attention span and ability to concentrate, able to name objects and repeat phrases. Appropriate fund of knowledge , normal sensation and normal coordination. Mental Status Affect - not angry, not paranoid. Cranial Nerves-Normal Bilaterally. Gait-Normal.  Neuropsychiatric Mental status exam performed with findings of-able to articulate well with normal speech/language, rate, volume and coherence, thought content normal with ability to perform basic computations and apply abstract reasoning and no evidence of hallucinations, delusions, obsessions or homicidal/suicidal ideation.  Musculoskeletal Global Assessment Spine, Ribs and Pelvis - no instability, subluxation or laxity. Right Upper Extremity - no instability, subluxation or laxity.  Lymphatic Head & Neck  General Head & Neck Lymphatics: Bilateral -  Description - No Localized lymphadenopathy. Axillary  General Axillary Region: Bilateral - Description - No Localized lymphadenopathy. Femoral & Inguinal  Generalized Femoral & Inguinal Lymphatics: Left - Description - No Localized lymphadenopathy. Right - Description - No Localized lymphadenopathy.    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 Probably 60-70 percent compression of right long. Worsening shortness of breath.  I think she would benefit from reduction and repair of hernia. Primary closure with Blake drain. Mesh reinforcement. Probably can do dual sided ventralite mesh sort of a modified ventral wall/onlay diaphragmatic reinforcement. Discussed with my other partner in advanced minimally invasive surgery, Dr. Rosendo Gros, whom agrees.  Because she is having increasing shortness of breath and has decent expected life expectancy with potential gain in pulmonary function, I offered repair. Robotic versus laparoscopic approach. She is interested in proceeding. We will work to coordinate a convenient time.  Current Plans You are being scheduled for surgery- Our schedulers will call you.  You should hear from our office's scheduling department within 5 working days about the location, date, and time of surgery. We try to make accommodations for patient's preferences in scheduling surgery, but sometimes the OR schedule or the surgeon's schedule prevents [place] from making those accommodations.  If you have not heard from our office [phone]) in 5 working days, call the office and ask for your surgeon's nurse.  If you have other questions about your diagnosis, plan, or surgery, call the office and ask for your surgeon's nurse.  Pt Education - [facility]oscopic [facility] The  anatomy & physiology of the foregut and anti-reflux mechanism was discussed. The pathophysiology of hiatal herniation and GERD was discussed. Natural history risks without surgery was discussed. The patient's symptoms are not adequately controlled by medicines and other non-operative treatments. I feel the risks of no intervention will lead to serious problems that outweigh the operative risks; therefore, I recommended surgery to reduce the hiatal hernia out of the chest and fundoplication to rebuild the anti-reflux valve and control reflux better. Need for a thorough workup to rule out the differential diagnosis and plan treatment was explained. I explained laparoscopic techniques with possible need for an open approach.  Risks such as  bleeding, infection, abscess, leak, need for further treatment, heart attack, death, and other risks were discussed. I noted a good likelihood this will help address the problem. Goals of post-operative recovery were discussed as well. Possibility that this will not correct all symptoms was explained. Post-operative dysphagia, need for short-term liquid & pureed diet, inability to vomit, possibility of reherniation, possible need for medicines to help control symptoms in addition to surgery were discussed. We will work to minimize complications. Educational handouts further explaining the pathology, treatment options, and dysphagia diet was given as well. Questions were answered. The patient expresses understanding & wishes to proceed with surgery.   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  Referred by medical oncology and felt not to be a contraindication to proceeding surgery.  Beth [name], M.D., F.A.C.S. Gastrointestinal and Minimally Invasive  Surgery [facility] [address] [place], [place] [address] [phone] Main / Paging

## 2017-05-03 ENCOUNTER — Other Ambulatory Visit: Payer: Self-pay | Admitting: Family Medicine

## 2017-05-09 ENCOUNTER — Inpatient Hospital Stay: Payer: Medicare Other

## 2017-05-09 ENCOUNTER — Inpatient Hospital Stay (HOSPITAL_BASED_OUTPATIENT_CLINIC_OR_DEPARTMENT_OTHER): Payer: Medicare Other | Admitting: Internal Medicine

## 2017-05-09 ENCOUNTER — Inpatient Hospital Stay: Payer: Medicare Other | Attending: Internal Medicine

## 2017-05-09 ENCOUNTER — Encounter: Payer: Self-pay | Admitting: Internal Medicine

## 2017-05-09 DIAGNOSIS — C7951 Secondary malignant neoplasm of bone: Secondary | ICD-10-CM | POA: Diagnosis not present

## 2017-05-09 DIAGNOSIS — K449 Diaphragmatic hernia without obstruction or gangrene: Secondary | ICD-10-CM | POA: Insufficient documentation

## 2017-05-09 DIAGNOSIS — I10 Essential (primary) hypertension: Secondary | ICD-10-CM

## 2017-05-09 DIAGNOSIS — C3490 Malignant neoplasm of unspecified part of unspecified bronchus or lung: Secondary | ICD-10-CM

## 2017-05-09 DIAGNOSIS — R21 Rash and other nonspecific skin eruption: Secondary | ICD-10-CM

## 2017-05-09 DIAGNOSIS — C3491 Malignant neoplasm of unspecified part of right bronchus or lung: Secondary | ICD-10-CM

## 2017-05-09 LAB — CBC WITH DIFFERENTIAL/PLATELET
Basophils Absolute: 0 10*3/uL (ref 0.0–0.1)
Basophils Relative: 0 %
EOS ABS: 0.3 10*3/uL (ref 0.0–0.5)
EOS PCT: 4 %
HCT: 35.3 % (ref 34.8–46.6)
Hemoglobin: 11.4 g/dL — ABNORMAL LOW (ref 11.6–15.9)
LYMPHS PCT: 25 %
Lymphs Abs: 1.7 10*3/uL (ref 0.9–3.3)
MCH: 29.9 pg (ref 25.1–34.0)
MCHC: 32.3 g/dL (ref 31.5–36.0)
MCV: 92.7 fL (ref 79.5–101.0)
Monocytes Absolute: 0.4 10*3/uL (ref 0.1–0.9)
Monocytes Relative: 6 %
Neutro Abs: 4.5 10*3/uL (ref 1.5–6.5)
Neutrophils Relative %: 65 %
PLATELETS: 281 10*3/uL (ref 145–400)
RBC: 3.81 MIL/uL (ref 3.70–5.45)
RDW: 17.7 % — ABNORMAL HIGH (ref 11.2–14.5)
WBC: 6.8 10*3/uL (ref 3.9–10.3)

## 2017-05-09 LAB — COMPREHENSIVE METABOLIC PANEL
ALT: 20 U/L (ref 0–55)
ANION GAP: 11 (ref 3–11)
AST: 25 U/L (ref 5–34)
Albumin: 3.6 g/dL (ref 3.5–5.0)
Alkaline Phosphatase: 88 U/L (ref 40–150)
BILIRUBIN TOTAL: 1.1 mg/dL (ref 0.2–1.2)
BUN: 9 mg/dL (ref 7–26)
CO2: 29 mmol/L (ref 22–29)
Calcium: 9.8 mg/dL (ref 8.4–10.4)
Chloride: 100 mmol/L (ref 98–109)
Creatinine, Ser: 0.87 mg/dL (ref 0.60–1.10)
GFR calc Af Amer: >60 mL/min (ref >60)
GFR calc non Af Amer: >60 mL/min (ref >60)
Glucose, Bld: 97 mg/dL (ref 70–140)
POTASSIUM: 3.8 mmol/L (ref 3.5–5.1)
Sodium: 140 mmol/L (ref 136–145)
TOTAL PROTEIN: 7.8 g/dL (ref 6.4–8.3)

## 2017-05-09 MED ORDER — DENOSUMAB 120 MG/1.7ML ~~LOC~~ SOLN
120.0000 mg | Freq: Once | SUBCUTANEOUS | Status: AC
  Administered 2017-05-09: 120 mg via SUBCUTANEOUS

## 2017-05-09 MED ORDER — DENOSUMAB 120 MG/1.7ML ~~LOC~~ SOLN
SUBCUTANEOUS | Status: AC
Start: 2017-05-09 — End: 2017-05-09
  Filled 2017-05-09: qty 1.7

## 2017-05-09 MED ORDER — DOXYCYCLINE HYCLATE 100 MG PO TABS: 100.0000 mg | ORAL_TABLET | Freq: Two times a day (BID) | ORAL | 0 refills | Status: DC

## 2017-05-09 NOTE — Progress Notes (Signed)
[facility] [phone]) [phone]   Fax:([phone]  OFFICE PROGRESS NOTE  [name], MD [place] [address]  DIAGNOSIS: Metastatic non-small cell lung cancer, adenocarcinoma with positive EGFR mutation in exon 19 diagnosed in June 2013  PRIOR THERAPY: Palliative radiotherapy to the right lung under the care of Dr. Valere Dross completed on 10/29/2011.  CURRENT THERAPY: 1) [name] 150 mg by mouth daily started 10/11/2011, status post 64 months of treatment. 2) Xgeva 120 mg subcutaneously every 8 weeks.  INTERVAL HISTORY: Beth [name] 57 y.o. female returns to the clinic today for 2 months follow-up visit.  The patient is feeling fine today with no specific complaints except for recent skin rash on the chest, upper extremities and back of the neck.  She was given prescription for clindamycin lotion and did help a lot.  She continues to have some remnant of that skin rash.  She denied having any chest pain, shortness of breath except with exertion, cough or hemoptysis.  She was seen by Dr. Johney Maine recently and expected to have surgery for the hiatal hernia on May 26, 2017.  She is here today for evaluation and repeat blood work.  MEDICAL HISTORY: Past Medical History:  Diagnosis Date  . Anxiety   . Arthritis of knee, degenerative   . Asthma   . Complication of anesthesia    difficulty waking up  . Depression   . Endocarditis    as teenager  . Fibromyalgia   . Flu-like symptoms 05/12/2016  . GERD (gastroesophageal reflux disease)   . Heart murmur   . History of radiation therapy 09/29/11-11/04/2011   right lung 2700cGy 15 sessions  . Hypertension   . Lung mass    R- ADENOCARCINOMA  . Osseous metastasis ([place]) 09/20/11   per PET scan  . Pleural effusion 08/30/11  . Shortness of breath   . Sleep apnea    no longer using CPAP    ALLERGIES:  is allergic to meloxicam.  MEDICATIONS:  Current Outpatient Medications  Medication  Sig Dispense Refill  . albuterol-ipratropium (COMBIVENT) 18-103 MCG/ACT inhaler Inhale 1 puff into the lungs every 6 (six) hours as needed for wheezing or shortness of breath.    Marland Kitchen amLODipine (NORVASC) 5 MG tablet TAKE 1 TABLET BY MOUTH EVERY DAY 90 tablet 1  . clindamycin (CLEOCIN-T) 1 % external solution Apply topically 2 (two) times daily. 60 mL 2  . clindamycin (CLEOCIN-T) 1 % external solution Apply topically 2 (two) times daily. Apply to face, chest , arms for rash 240 mL 0  . COMBIVENT RESPIMAT 20-100 MCG/ACT AERS respimat INHALE 1 PUFF INTO THE LUNGS EVERY 6 HOURS AS NEEDED FOR WHEEZING 4 g 0  . cyclobenzaprine (FLEXERIL) 10 MG tablet TAKE 1 TABLET BY MOUTH THREE TIMES DAILY AS NEEDED FOR MUSCLE SPASMS. AVOID REGULAR USE (Patient not taking: Reported on 01/22/2017) 30 tablet 0  . diphenhydrAMINE (BENADRYL) 25 MG tablet Take 25 mg by mouth every 6 (six) hours as needed.    . diphenoxylate-atropine (LOMOTIL) 2.5-0.025 MG tablet TAKE 2 TABLETS BY MOUTH FOUR TIMES DAILY AS NEEDED FOR DIARRHEA OR LOOSE STOOL. ALTERNATE WITH IMMODIUM. MAX OF 8 TABLETS PER DAY 30 tablet 0  . doxycycline (VIBRAMYCIN) 100 MG capsule Take 1 capsule (100 mg total) by mouth 2 (two) times daily. 20 capsule 0  . ergocalciferol (VITAMIN D2) 50000 units capsule Take 1 capsule (50,000 Units total) by mouth once a week. 12 capsule 3  .  famotidine (PEPCID) 20 MG tablet One after bfast and after supper (Patient not taking: Reported on 01/22/2017) 60 tablet 2  . fish oil-omega-3 fatty acids 1000 MG capsule Take 2 g by mouth daily.    . furosemide (LASIX) 20 MG tablet TAKE 1 TABLET BY MOUTH DAILY AS NEEDED FOR SEVERE EDEMA 30 tablet 0  . hydrochlorothiazide (HYDRODIURIL) 25 MG tablet TAKE 1 TABLET BY MOUTH EVERY DAY 90 tablet 0  . hydrOXYzine (ATARAX/VISTARIL) 10 MG tablet TAKE 1 TABLET BY MOUTH THREE TIMES DAILY AS NEEDED FOR ITCHING 30 tablet 1  . hydrOXYzine (ATARAX/VISTARIL) 25 MG tablet One tablet every 6 hours as needed for  itching 30 tablet 0  . ibuprofen (ADVIL,MOTRIN) 800 MG tablet TAKE 1 TABLET(800 MG) BY MOUTH DAILY AS NEEDED 30 tablet 0  . ketoconazole (NIZORAL) 2 % shampoo APPLY EXTERNALLY 2 TIMES A WEEK 120 mL 0  . loperamide (IMODIUM) 2 MG capsule Take 2 mg by mouth 4 (four) times daily as needed for diarrhea or loose stools.    . mirtazapine (REMERON) 30 MG tablet TAKE 1 TABLET(30 MG) BY MOUTH AT BEDTIME 30 tablet 2  . mometasone (NASONEX) 50 MCG/ACT nasal spray Place 2 sprays into the nose daily. 17 g 12  . Multiple Vitamins-Minerals (MULTIVITAMIN WITH MINERALS) tablet Take 1 tablet by mouth daily.    . mupirocin ointment (BACTROBAN) 2 % APPLY INTO EACH NOSTRIL TWICE DAILY 22 g 0  . NONFORMULARY OR COMPOUNDED ITEM Cbcd, cmp,ua   Dx hx fever, hx endocarditis, 1 each 0  . Oxycodone HCl 10 MG TABS Take 1 tablet (10 mg total) by mouth every 6 (six) hours as needed. 90 tablet 0  . PREMARIN vaginal cream Place 1 application vaginally 2 (two) times daily.  5  . Probiotic Product (ALIGN PO) Take 1 capsule by mouth daily.    . prochlorperazine (COMPAZINE) 10 MG tablet TAKE ONE TABLET BY MOUTH EVERY 6 HOURS AS NEEDED 385 tablet 0  . [name] 150 MG tablet TAKE 1 TABLET BY MOUTH DAILY ON AN EMPTY STOMACH 1 HOUR BEFORE OR 2 HOURS AFTER A MEAL 30 tablet 0  . terbinafine (LAMISIL) 250 MG tablet Take 1 tablet (250 mg total) by mouth daily. 14 tablet 0  . triamcinolone (KENALOG) 0.025 % cream APPLY EXTERNALLY TO THE AFFECTED AREA TWICE DAILY 30 g 0  . venlafaxine XR (EFFEXOR-XR) 75 MG 24 hr capsule TAKE 1 CAPSULE(75 MG) BY MOUTH 1 TIME 90 capsule 3   No current facility-administered medications for this visit.     SURGICAL HISTORY:  Past Surgical History:  Procedure Laterality Date  . THORACENTESIS  09/02/11, 09/09/11   right-side pleural effusion  . TUBAL LIGATION  2002    REVIEW OF SYSTEMS:  A comprehensive review of systems was negative except for: Constitutional: positive for fatigue Respiratory: positive for  dyspnea on exertion Integument/breast: positive for rash   PHYSICAL EXAMINATION: General appearance: alert, cooperative and no distress Head: Normocephalic, without obvious abnormality, atraumatic Neck: no adenopathy, no JVD, supple, symmetrical, trachea midline and thyroid not enlarged, symmetric, no tenderness/mass/nodules Lymph nodes: Cervical, supraclavicular, and axillary nodes normal. Resp: clear to auscultation bilaterally Back: symmetric, no curvature. ROM normal. No CVA tenderness. Cardio: regular rate and rhythm, S1, S2 normal, no murmur, click, rub or gallop GI: soft, non-tender; bowel sounds normal; no masses,  no organomegaly Extremities: extremities normal, atraumatic, no cyanosis or edema  ECOG PERFORMANCE STATUS: 1 - Symptomatic but completely ambulatory  Blood pressure (!) 142/106, pulse (!) 101, temperature  98.3 F (36.8 C), temperature source Oral, resp. rate 20, height '5\' 5"'  (1.651 m), weight 276 lb 8 oz (125.4 kg), last menstrual period 09/28/2011, SpO2 96 %.  LABORATORY DATA: Lab Results  Component Value Date   WBC 6.8 05/09/2017   HGB 11.4 (L) 05/09/2017   HCT 35.3 05/09/2017   MCV 92.7 05/09/2017   PLT 281 05/09/2017      Chemistry      Component Value Date/Time   NA 140 05/09/2017 1427   NA 139 03/21/2017 1519   K 3.8 05/09/2017 1427   K 3.6 03/21/2017 1519   CL 100 05/09/2017 1427   CL 104 08/18/2012 1154   CO2 29 05/09/2017 1427   CO2 25 03/21/2017 1519   BUN 9 05/09/2017 1427   BUN 12.1 03/21/2017 1519   CREATININE 0.87 05/09/2017 1427   CREATININE 0.9 03/21/2017 1519      Component Value Date/Time   CALCIUM 9.8 05/09/2017 1427   CALCIUM 9.3 03/21/2017 1519   ALKPHOS 88 05/09/2017 1427   ALKPHOS 95 03/21/2017 1519   AST 25 05/09/2017 1427   AST 25 03/21/2017 1519   ALT 20 05/09/2017 1427   ALT 21 03/21/2017 1519   BILITOT 1.1 05/09/2017 1427   BILITOT 1.20 03/21/2017 1519       RADIOGRAPHIC STUDIES:  ASSESSMENT AND PLAN:  This  is a very pleasant 57 years old African-American female with stage IV non-small cell lung cancer, adenocarcinoma with positive EGFR mutation in exon 64 diagnosed in June 2013. The patient is currently on treatment with [name] 150 mg by mouth daily status post 64 months of treatment. The patient tolerated the last month of her treatment well with no concerning complaints. I recommended for her to continue her current treatment with [name] with the same dose. I will see her back for follow-up visit in 2 months for evaluation with repeat CT scan of the chest, abdomen and pelvis for restaging of her disease. For the metastatic bone disease, she will continue treatment with Xgeva every 8 weeks. For the hiatal hernia, she is scheduled to have surgery next month. For hypertension, she was advised to take her blood pressure medication as prescribed and to discuss with her primary care physician for adjustment of her medication if needed. The patient was advised to call immediately if she has any concerning symptoms in the interval. The patient voices understanding of current disease status and treatment options and is in agreement with the current care plan. All questions were answered. The patient knows to call the clinic with any problems, questions or concerns. We can certainly see the patient much sooner if necessary.  Disclaimer: This note was dictated with voice recognition software. Similar sounding words can inadvertently be transcribed and may not be corrected upon review.

## 2017-05-09 NOTE — Patient Instructions (Signed)
Denosumab injection  What is this medicine?  DENOSUMAB (den oh sue mab) slows bone breakdown. Prolia is used to treat osteoporosis in women after menopause and in men. Xgeva is used to prevent bone fractures and other bone problems caused by cancer bone metastases. Xgeva is also used to treat giant cell tumor of the bone.  This medicine may be used for other purposes; ask your health care provider or pharmacist if you have questions.  What should I tell my health care provider before I take this medicine?  They need to know if you have any of these conditions:  -dental disease  -eczema  -infection or history of infections  -kidney disease or on dialysis  -low blood calcium or vitamin D  -malabsorption syndrome  -scheduled to have surgery or tooth extraction  -taking medicine that contains denosumab  -thyroid or parathyroid disease  -an unusual reaction to denosumab, other medicines, foods, dyes, or preservatives  -pregnant or trying to get pregnant  -breast-feeding  How should I use this medicine?  This medicine is for injection under the skin. It is given by a health care professional in a hospital or clinic setting.  If you are getting Prolia, a special MedGuide will be given to you by the pharmacist with each prescription and refill. Be sure to read this information carefully each time.  For Prolia, talk to your pediatrician regarding the use of this medicine in children. Special care may be needed. For Xgeva, talk to your pediatrician regarding the use of this medicine in children. While this drug may be prescribed for children as young as 13 years for selected conditions, precautions do apply.  Overdosage: If you think you have taken too much of this medicine contact a poison control center or emergency room at once.  NOTE: This medicine is only for you. Do not share this medicine with others.  What if I miss a dose?  It is important not to miss your dose. Call your doctor or health care professional if you are  unable to keep an appointment.  What may interact with this medicine?  Do not take this medicine with any of the following medications:  -other medicines containing denosumab  This medicine may also interact with the following medications:  -medicines that suppress the immune system  -medicines that treat cancer  -steroid medicines like prednisone or cortisone  This list may not describe all possible interactions. Give your health care provider a list of all the medicines, herbs, non-prescription drugs, or dietary supplements you use. Also tell them if you smoke, drink alcohol, or use illegal drugs. Some items may interact with your medicine.  What should I watch for while using this medicine?  Visit your doctor or health care professional for regular checks on your progress. Your doctor or health care professional may order blood tests and other tests to see how you are doing.  Call your doctor or health care professional if you get a cold or other infection while receiving this medicine. Do not treat yourself. This medicine may decrease your body's ability to fight infection.  You should make sure you get enough calcium and vitamin D while you are taking this medicine, unless your doctor tells you not to. Discuss the foods you eat and the vitamins you take with your health care professional.  See your dentist regularly. Brush and floss your teeth as directed. Before you have any dental work done, tell your dentist you are receiving this medicine.  Do   not become pregnant while taking this medicine or for 5 months after stopping it. Women should inform their doctor if they wish to become pregnant or think they might be pregnant. There is a potential for serious side effects to an unborn child. Talk to your health care professional or pharmacist for more information.  What side effects may I notice from receiving this medicine?  Side effects that you should report to your doctor or health care professional as soon as  possible:  -allergic reactions like skin rash, itching or hives, swelling of the face, lips, or tongue  -breathing problems  -chest pain  -fast, irregular heartbeat  -feeling faint or lightheaded, falls  -fever, chills, or any other sign of infection  -muscle spasms, tightening, or twitches  -numbness or tingling  -skin blisters or bumps, or is dry, peels, or red  -slow healing or unexplained pain in the mouth or jaw  -unusual bleeding or bruising  Side effects that usually do not require medical attention (Report these to your doctor or health care professional if they continue or are bothersome.):  -muscle pain  -stomach upset, gas  This list may not describe all possible side effects. Call your doctor for medical advice about side effects. You may report side effects to FDA at 1-800-FDA-1088.  Where should I keep my medicine?  This medicine is only given in a clinic, doctor's office, or other health care setting and will not be stored at home.  NOTE: This sheet is a summary. It may not cover all possible information. If you have questions about this medicine, talk to your doctor, pharmacist, or health care provider.      2016, Elsevier/Gold Standard. (2011-09-06 12:37:47)

## 2017-05-10 ENCOUNTER — Telehealth: Payer: Self-pay | Admitting: Internal Medicine

## 2017-05-10 NOTE — Telephone Encounter (Signed)
Scheduled appt per 2/18 los - Sent reminder letter in the mail - lab and f/u and ct in April [facility] radiology to contact patient

## 2017-05-11 ENCOUNTER — Other Ambulatory Visit: Payer: Self-pay | Admitting: Internal Medicine

## 2017-05-12 ENCOUNTER — Ambulatory Visit: Payer: Self-pay | Admitting: Surgery

## 2017-05-12 MED ORDER — VANCOMYCIN HCL 10 G IV SOLR: 1500.0000 mg | INTRAVENOUS | Status: AC

## 2017-05-18 ENCOUNTER — Other Ambulatory Visit: Payer: Self-pay | Admitting: Medical Oncology

## 2017-05-18 DIAGNOSIS — I10 Essential (primary) hypertension: Secondary | ICD-10-CM

## 2017-05-18 DIAGNOSIS — C3490 Malignant neoplasm of unspecified part of unspecified bronchus or lung: Secondary | ICD-10-CM

## 2017-05-18 DIAGNOSIS — Z5111 Encounter for antineoplastic chemotherapy: Secondary | ICD-10-CM

## 2017-05-18 MED ORDER — OXYCODONE HCL 10 MG PO TABS
10.0000 mg | ORAL_TABLET | Freq: Four times a day (QID) | ORAL | 0 refills | Status: DC | PRN
Start: 2017-05-18 — End: 2017-06-01

## 2017-05-19 ENCOUNTER — Encounter (HOSPITAL_COMMUNITY): Payer: Self-pay

## 2017-05-19 MED FILL — oxyCODONE HCL 10 MG TABS: 10 | 23 days supply | Qty: 90 | Fill #0

## 2017-05-19 NOTE — Pre-Procedure Instructions (Signed)
The following are in epic: Last office visit note Dr. Julien Nordmann 05/09/2017 CBC & CMP 05/09/2017 CT abd/pelvis, Chest 02/17/2018

## 2017-05-19 NOTE — Patient Instructions (Addendum)
Your procedure is scheduled on: Thursday, May 26, 2017   Surgery Time: 7:45AM-10:45AM   Report to [facility] by 5:45 AM pick up the phone at the front desk dial [phone], have a seat in the lobby and a staff member will come and escort you to Short Stay Department   Call this number if you have problems the morning of surgery [phone]   Do not eat food: After Midnight.   Do NOT smoke after Midnight   Take Carb-loading drinks just before surgery (~219mL = pint- sized, for example: Ensure PreSurgery):   2 drinks at bedtime the night before surgery   1 drink upon awakening the morning of surgery  NOTHING to drink <2 hours before surgery    Take these medicines the morning of surgery with A SIP OF WATER: Amlodipine, [name], Venlafaxine   BRING ASTHMA INHALER DAY OF SURGERY                               You may not have any metal on your body including hair pins, jewelry, and body piercings             Do not wear make-up, lotions, powders, perfumes/cologne, or deodorant             Do not wear nail polish.  Do not shave  48 hours prior to surgery.               Do not bring valuables to the hospital. [place].   Contacts, dentures or bridgework may not be worn into surgery.   Leave suitcase in the car. After surgery it may be brought to your room.   Special Instructions: Bring a copy of your healthcare power of attorney and living will documents         the day of surgery if you haven't scanned them in before.              Please read over the following fact sheets you were given:  [facility] - Preparing for Surgery Before surgery, you can play an important role.  Because skin is not sterile, your skin needs to be as free of germs as possible.  You can reduce the number of germs on your skin by washing with CHG (chlorahexidine gluconate) soap before surgery.  CHG is an  antiseptic cleaner which kills germs and bonds with the skin to continue killing germs even after washing. Please DO NOT use if you have an allergy to CHG or antibacterial soaps.  If your skin becomes reddened/irritated stop using the CHG and inform your nurse when you arrive at Short Stay. Do not shave (including legs and underarms) for at least 48 hours prior to the first CHG shower.  You may shave your face/neck.  Please follow these instructions carefully:  1.  Shower with CHG Soap the night before surgery and the  morning of surgery.  2.  If you choose to wash your hair, wash your hair first as usual with your normal  shampoo.  3.  After you shampoo, rinse your hair and body thoroughly to remove the shampoo.  4.  Use CHG as you would any other liquid soap.  You can apply chg directly to the skin and wash.  Gently with a scrungie or clean washcloth.  5.  Apply the CHG Soap to your body ONLY FROM THE NECK DOWN.   Do   not use on face/ open                           Wound or open sores. Avoid contact with eyes, ears mouth and   genitals (private parts).                       Wash face,  Genitals (private parts) with your normal soap.             6.  Wash thoroughly, paying special attention to the area where your    surgery  will be performed.  7.  Thoroughly rinse your body with warm water from the neck down.  8.  DO NOT shower/wash with your normal soap after using and rinsing off the CHG Soap.                9.  Pat yourself dry with a clean towel.            10.  Wear clean pajamas.            11.  Place clean sheets on your bed the night of your first shower and do not  sleep with pets. Day of Surgery : Do not apply any lotions/deodorants the morning of surgery.  Please wear clean clothes to the hospital/surgery center.  FAILURE TO FOLLOW THESE INSTRUCTIONS MAY RESULT IN THE CANCELLATION OF YOUR SURGERY  PATIENT  SIGNATURE_________________________________  NURSE SIGNATURE__________________________________  ________________________________________________________________________  WHAT IS A BLOOD TRANSFUSION? Blood Transfusion Information  A transfusion is the replacement of blood or some of its parts. Blood is made up of multiple cells which provide different functions.  Red blood cells carry oxygen and are used for blood loss replacement.  White blood cells fight against infection.  Platelets control bleeding.  Plasma helps clot blood.  Other blood products are available for specialized needs, such as hemophilia or other clotting disorders. BEFORE THE TRANSFUSION  Who gives blood for transfusions?   Healthy volunteers who are fully evaluated to make sure their blood is safe. This is blood bank blood. Transfusion therapy is the safest it has ever been in the practice of medicine. Before blood is taken from a donor, a complete history is taken to make sure that person has no history of diseases nor engages in risky social behavior (examples are intravenous drug use or sexual activity with multiple partners). The donor's travel history is screened to minimize risk of transmitting infections, such as malaria. The donated blood is tested for signs of infectious diseases, such as HIV and hepatitis. The blood is then tested to be sure it is compatible with you in order to minimize the chance of a transfusion reaction. If you or a relative donates blood, this is often done in anticipation of surgery and is not appropriate for emergency situations. It takes many days to process the donated blood. RISKS AND COMPLICATIONS Although transfusion therapy is very safe and saves many lives, the main dangers of transfusion include:   Getting an infectious disease.  Developing a transfusion reaction. This is an allergic reaction to something in the blood you were given. Every precaution is  taken to prevent  this. The decision to have a blood transfusion has been considered carefully by your caregiver before blood is given. Blood is not given unless the benefits outweigh the risks. AFTER THE TRANSFUSION  Right after receiving a blood transfusion, you will usually feel much better and more energetic. This is especially true if your red blood cells have gotten low (anemic). The transfusion raises the level of the red blood cells which carry oxygen, and this usually causes an energy increase.  The nurse administering the transfusion will monitor you carefully for complications. HOME CARE INSTRUCTIONS  No special instructions are needed after a transfusion. You may find your energy is better. Speak with your caregiver about any limitations on activity for underlying diseases you may have. SEEK MEDICAL CARE IF:   Your condition is not improving after your transfusion.  You develop redness or irritation at the intravenous (IV) site. SEEK IMMEDIATE MEDICAL CARE IF:  Any of the following symptoms occur over the next 12 hours:  Shaking chills.  You have a temperature by mouth above 102 F (38.9 C), not controlled by medicine.  Chest, back, or muscle pain.  People around you feel you are not acting correctly or are confused.  Shortness of breath or difficulty breathing.  Dizziness and fainting.  You get a rash or develop hives.  You have a decrease in urine output.  Your urine turns a dark color or changes to pink, red, or brown. Any of the following symptoms occur over the next 10 days:  You have a temperature by mouth above 102 F (38.9 C), not controlled by medicine.  Shortness of breath.  Weakness after normal activity.  The white part of the eye turns yellow (jaundice).  You have a decrease in the amount of urine or are urinating less often.  Your urine turns a dark color or changes to pink, red, or brown. Document Released: 03/05/2000 Document Revised: 05/31/2011 Document  Reviewed: 10/23/2007 [facility] Patient Information 2014 [place], [place].  _______________________________________________________________________

## 2017-05-20 ENCOUNTER — Inpatient Hospital Stay (HOSPITAL_COMMUNITY)
Admission: RE | Admit: 2017-05-20 | Discharge: 2017-05-20 | Disposition: A | Discharge status: Home / Self Care | Payer: Medicare Other | Source: Ambulatory Visit

## 2017-05-20 HISTORY — DX: Atherosclerosis of aorta: I70.0

## 2017-05-20 HISTORY — DX: Morbid (severe) obesity due to excess calories: E66.01

## 2017-05-20 HISTORY — DX: Emphysema, unspecified: J43.9

## 2017-05-24 ENCOUNTER — Encounter (HOSPITAL_COMMUNITY)
Admission: RE | Admit: 2017-05-24 | Discharge: 2017-05-24 | Disposition: A | Discharge status: Home / Self Care | Payer: Medicare Other | Source: Ambulatory Visit | Attending: Surgery | Admitting: Surgery

## 2017-05-24 ENCOUNTER — Encounter (HOSPITAL_COMMUNITY): Payer: Self-pay

## 2017-05-24 ENCOUNTER — Other Ambulatory Visit: Payer: Self-pay | Admitting: Family Medicine

## 2017-05-24 ENCOUNTER — Other Ambulatory Visit: Payer: Self-pay

## 2017-05-24 DIAGNOSIS — N182 Chronic kidney disease, stage 2 (mild): Secondary | ICD-10-CM | POA: Diagnosis not present

## 2017-05-24 DIAGNOSIS — Z923 Personal history of irradiation: Secondary | ICD-10-CM | POA: Diagnosis not present

## 2017-05-24 DIAGNOSIS — J9811 Atelectasis: Secondary | ICD-10-CM | POA: Diagnosis not present

## 2017-05-24 DIAGNOSIS — K219 Gastro-esophageal reflux disease without esophagitis: Secondary | ICD-10-CM | POA: Diagnosis not present

## 2017-05-24 DIAGNOSIS — C7951 Secondary malignant neoplasm of bone: Secondary | ICD-10-CM | POA: Diagnosis not present

## 2017-05-24 DIAGNOSIS — I129 Hypertensive chronic kidney disease with stage 1 through stage 4 chronic kidney disease, or unspecified chronic kidney disease: Secondary | ICD-10-CM | POA: Diagnosis not present

## 2017-05-24 DIAGNOSIS — Z87891 Personal history of nicotine dependence: Secondary | ICD-10-CM | POA: Diagnosis not present

## 2017-05-24 DIAGNOSIS — Z01812 Encounter for preprocedural laboratory examination: Secondary | ICD-10-CM

## 2017-05-24 DIAGNOSIS — J439 Emphysema, unspecified: Secondary | ICD-10-CM | POA: Diagnosis not present

## 2017-05-24 DIAGNOSIS — Z9221 Personal history of antineoplastic chemotherapy: Secondary | ICD-10-CM | POA: Diagnosis not present

## 2017-05-24 DIAGNOSIS — J90 Pleural effusion, not elsewhere classified: Secondary | ICD-10-CM | POA: Diagnosis not present

## 2017-05-24 DIAGNOSIS — N170 Acute kidney failure with tubular necrosis: Secondary | ICD-10-CM | POA: Diagnosis not present

## 2017-05-24 DIAGNOSIS — I739 Peripheral vascular disease, unspecified: Secondary | ICD-10-CM | POA: Diagnosis not present

## 2017-05-24 DIAGNOSIS — T83031A Leakage of indwelling urethral catheter, initial encounter: Secondary | ICD-10-CM | POA: Diagnosis not present

## 2017-05-24 DIAGNOSIS — G4733 Obstructive sleep apnea (adult) (pediatric): Secondary | ICD-10-CM | POA: Diagnosis not present

## 2017-05-24 DIAGNOSIS — Z9119 Patient's noncompliance with other medical treatment and regimen: Secondary | ICD-10-CM | POA: Diagnosis not present

## 2017-05-24 DIAGNOSIS — R0902 Hypoxemia: Secondary | ICD-10-CM | POA: Diagnosis not present

## 2017-05-24 DIAGNOSIS — Z01810 Encounter for preprocedural cardiovascular examination: Secondary | ICD-10-CM

## 2017-05-24 DIAGNOSIS — K649 Unspecified hemorrhoids: Secondary | ICD-10-CM | POA: Diagnosis not present

## 2017-05-24 DIAGNOSIS — L219 Seborrheic dermatitis, unspecified: Secondary | ICD-10-CM

## 2017-05-24 DIAGNOSIS — C3491 Malignant neoplasm of unspecified part of right bronchus or lung: Secondary | ICD-10-CM | POA: Diagnosis not present

## 2017-05-24 DIAGNOSIS — Z6841 Body Mass Index (BMI) 40.0 and over, adult: Secondary | ICD-10-CM | POA: Diagnosis not present

## 2017-05-24 DIAGNOSIS — D649 Anemia, unspecified: Secondary | ICD-10-CM | POA: Diagnosis not present

## 2017-05-24 DIAGNOSIS — R278 Other lack of coordination: Secondary | ICD-10-CM | POA: Diagnosis not present

## 2017-05-24 DIAGNOSIS — K440 Diaphragmatic hernia with obstruction, without gangrene: Secondary | ICD-10-CM | POA: Diagnosis not present

## 2017-05-24 HISTORY — DX: Dermatitis, unspecified: L30.9

## 2017-05-24 HISTORY — DX: Respiratory tuberculosis unspecified: A15.9

## 2017-05-24 HISTORY — DX: Anemia, unspecified: D64.9

## 2017-05-24 HISTORY — DX: Personal history of Methicillin resistant Staphylococcus aureus infection: Z86.14

## 2017-05-24 LAB — SURGICAL PCR SCREEN
MRSA, PCR: NEGATIVE
STAPHYLOCOCCUS AUREUS: NEGATIVE

## 2017-05-24 MED ORDER — ENSURE PRE-SURGERY PO LIQD
296.0000 mL | Freq: Once | ORAL | Status: DC
  Filled 2017-05-24: qty 296

## 2017-05-24 MED ORDER — ENSURE SURGERY PO LIQD
237.0000 mL | Freq: Two times a day (BID) | ORAL | Status: DC
  Filled 2017-05-24: qty 237

## 2017-05-24 MED ORDER — ENSURE PRE-SURGERY PO LIQD
592.0000 mL | Freq: Once | ORAL | Status: DC
  Filled 2017-05-24: qty 592

## 2017-05-24 NOTE — Patient Instructions (Signed)
Your procedure is scheduled on: Thursday, May 26, 2017   Surgery Time:  7:45AM-10:45AM   Report to [facility] by 5:45 AM pick up the phone at the front desk dial [phone], have a seat in the lobby and a staff member will come and escort you to Short Stay Department   Call this number if you have problems the morning of surgery [phone]   Do not eat food or drink liquids :After Midnight.   Do NOT smoke after Midnight   Drink 2 Ensure drinks the night before surgery.  Complete one Ensure drink the morning of surgery 3 hours prior to scheduled surgery.   Take these medicines the morning of surgery with A SIP OF WATER: Amlodipine, [name]   Bring Asthma Inhaler day of surgery                               You may not have any metal on your body including hair pins, jewelry, and body piercings             Do not wear make-up, lotions, powders, perfumes/cologne, or deodorant             Do not wear nail polish.  Do not shave  48 hours prior to surgery.                Do not bring valuables to the hospital. [place].   Contacts, dentures or bridgework may not be worn into surgery.   Leave suitcase in the car. After surgery it may be brought to your room.   Special Instructions: Bring a copy of your healthcare power of attorney and living will documents         the day of surgery if you haven't scanned them in before.              Please read over the following fact sheets you were given:  [facility] - Preparing for Surgery Before surgery, you can play an important role.  Because skin is not sterile, your skin needs to be as free of germs as possible.  You can reduce the number of germs on your skin by washing with CHG (chlorahexidine gluconate) soap before surgery.  CHG is an antiseptic cleaner which kills germs and bonds with the skin to continue killing germs even after washing. Please DO  NOT use if you have an allergy to CHG or antibacterial soaps.  If your skin becomes reddened/irritated stop using the CHG and inform your nurse when you arrive at Short Stay. Do not shave (including legs and underarms) for at least 48 hours prior to the first CHG shower.  You may shave your face/neck.  Please follow these instructions carefully:  1.  Shower with CHG Soap the night before surgery and the  morning of surgery.  2.  If you choose to wash your hair, wash your hair first as usual with your normal  shampoo.  3.  After you shampoo, rinse your hair and body thoroughly to remove the shampoo.                             4.  Use CHG as you would any other liquid soap.  You  can apply chg directly to the skin and wash.  Gently with a scrungie or clean washcloth.  5.  Apply the CHG Soap to your body ONLY FROM THE NECK DOWN.   Do   not use on face/ open                           Wound or open sores. Avoid contact with eyes, ears mouth and   genitals (private parts).                       Wash face,  Genitals (private parts) with your normal soap.             6.  Wash thoroughly, paying special attention to the area where your    surgery  will be performed.  7.  Thoroughly rinse your body with warm water from the neck down.  8.  DO NOT shower/wash with your normal soap after using and rinsing off the CHG Soap.                9.  Pat yourself dry with a clean towel.            10.  Wear clean pajamas.            11.  Place clean sheets on your bed the night of your first shower and do not  sleep with pets. Day of Surgery : Do not apply any lotions/deodorants the morning of surgery.  Please wear clean clothes to the hospital/surgery center.  FAILURE TO FOLLOW THESE INSTRUCTIONS MAY RESULT IN THE CANCELLATION OF YOUR SURGERY  PATIENT SIGNATURE_________________________________  NURSE  SIGNATURE__________________________________  ________________________________________________________________________  WHAT IS A BLOOD TRANSFUSION? Blood Transfusion Information  A transfusion is the replacement of blood or some of its parts. Blood is made up of multiple cells which provide different functions.  Red blood cells carry oxygen and are used for blood loss replacement.  White blood cells fight against infection.  Platelets control bleeding.  Plasma helps clot blood.  Other blood products are available for specialized needs, such as hemophilia or other clotting disorders. BEFORE THE TRANSFUSION  Who gives blood for transfusions?   Healthy volunteers who are fully evaluated to make sure their blood is safe. This is blood bank blood. Transfusion therapy is the safest it has ever been in the practice of medicine. Before blood is taken from a donor, a complete history is taken to make sure that person has no history of diseases nor engages in risky social behavior (examples are intravenous drug use or sexual activity with multiple partners). The donor's travel history is screened to minimize risk of transmitting infections, such as malaria. The donated blood is tested for signs of infectious diseases, such as HIV and hepatitis. The blood is then tested to be sure it is compatible with you in order to minimize the chance of a transfusion reaction. If you or a relative donates blood, this is often done in anticipation of surgery and is not appropriate for emergency situations. It takes many days to process the donated blood. RISKS AND COMPLICATIONS Although transfusion therapy is very safe and saves many lives, the main dangers of transfusion include:   Getting an infectious disease.  Developing a transfusion reaction. This is an allergic reaction to something in the blood you were given. Every precaution is taken to prevent this. The decision to have a blood transfusion has been  considered carefully by your caregiver before blood is given. Blood is not given unless the benefits outweigh the risks. AFTER THE TRANSFUSION  Right after receiving a blood transfusion, you will usually feel much better and more energetic. This is especially true if your red blood cells have gotten low (anemic). The transfusion raises the level of the red blood cells which carry oxygen, and this usually causes an energy increase.  The nurse administering the transfusion will monitor you carefully for complications. HOME CARE INSTRUCTIONS  No special instructions are needed after a transfusion. You may find your energy is better. Speak with your caregiver about any limitations on activity for underlying diseases you may have. SEEK MEDICAL CARE IF:   Your condition is not improving after your transfusion.  You develop redness or irritation at the intravenous (IV) site. SEEK IMMEDIATE MEDICAL CARE IF:  Any of the following symptoms occur over the next 12 hours:  Shaking chills.  You have a temperature by mouth above 102 F (38.9 C), not controlled by medicine.  Chest, back, or muscle pain.  People around you feel you are not acting correctly or are confused.  Shortness of breath or difficulty breathing.  Dizziness and fainting.  You get a rash or develop hives.  You have a decrease in urine output.  Your urine turns a dark color or changes to pink, red, or brown. Any of the following symptoms occur over the next 10 days:  You have a temperature by mouth above 102 F (38.9 C), not controlled by medicine.  Shortness of breath.  Weakness after normal activity.  The white part of the eye turns yellow (jaundice).  You have a decrease in the amount of urine or are urinating less often.  Your urine turns a dark color or changes to pink, red, or brown. Document Released: 03/05/2000 Document Revised: 05/31/2011 Document Reviewed: 10/23/2007 [facility] Patient Information 2014  [place], [place].  _______________________________________________________________________

## 2017-05-25 ENCOUNTER — Other Ambulatory Visit: Payer: Self-pay | Admitting: Internal Medicine

## 2017-05-25 ENCOUNTER — Encounter (HOSPITAL_COMMUNITY): Payer: Self-pay | Admitting: Certified Registered Nurse Anesthetist

## 2017-05-25 DIAGNOSIS — C3490 Malignant neoplasm of unspecified part of unspecified bronchus or lung: Secondary | ICD-10-CM

## 2017-05-25 MED ORDER — BUPIVACAINE LIPOSOME 1.3 % IJ SUSP
20.0000 mL | INTRAMUSCULAR | Status: DC
  Filled 2017-05-25: qty 20

## 2017-05-25 NOTE — Anesthesia Preprocedure Evaluation (Addendum)
Anesthesia Evaluation  Patient identified by MRN, date of birth, ID band Patient awake    Reviewed: Allergy & Precautions, NPO status , Patient's Chart, lab work & pertinent test results  Airway Mallampati: II  TM Distance: >3 FB Neck ROM: Full    Dental  (+) Dental Advisory Given   Pulmonary shortness of breath, asthma , sleep apnea , COPD, former smoker,  Hx lung CA s/p radiation tx. Diaphragmatic hernia with transverse Colon in right hemithorax.   breath sounds clear to auscultation       Cardiovascular hypertension, Pt. on medications + Peripheral Vascular Disease   Rhythm:Regular Rate:Normal  04/2016 Echo: Normal LV size with EF 55-60%. Normal RV size and systolic function. Eccentric mitral regurgitation jet, looks mild. Origin of jet from mid anterior leaflet, cannot rule out perforation. MV is thickened in some views but no definite vegetation. If suspicion for endocarditis is significant, will need TEE.   Neuro/Psych negative neurological ROS     GI/Hepatic Neg liver ROS, GERD  ,  Endo/Other  negative endocrine ROS  Renal/GU negative Renal ROS     Musculoskeletal  (+) Arthritis , Fibromyalgia -  Abdominal   Peds  Hematology   Anesthesia Other Findings   Reproductive/Obstetrics                            Lab Results  Component Value Date   WBC 6.8 05/09/2017   HGB 11.4 (L) 05/09/2017   HCT 35.3 05/09/2017   MCV 92.7 05/09/2017   PLT 281 05/09/2017   Lab Results  Component Value Date   CREATININE 0.87 05/09/2017   BUN 9 05/09/2017   NA 140 05/09/2017   K 3.8 05/09/2017   CL 100 05/09/2017   CO2 29 05/09/2017    Anesthesia Physical Anesthesia Plan  ASA: III  Anesthesia Plan: General   Post-op Pain Management:    Induction: Intravenous  PONV Risk Score and Plan: 4 or greater and Scopolamine patch - Pre-op, Midazolam, Dexamethasone and Ondansetron  Airway Management  Planned: Oral ETT  Additional Equipment: Arterial line  Intra-op Plan:   Post-operative Plan: Extubation in OR and Possible Post-op intubation/ventilation  Informed Consent: I have reviewed the patients History and Physical, chart, labs and discussed the procedure including the risks, benefits and alternatives for the proposed anesthesia with the patient or authorized representative who has indicated his/her understanding and acceptance.   Dental advisory given  Plan Discussed with: CRNA  Anesthesia Plan Comments:        Anesthesia Quick Evaluation

## 2017-05-26 ENCOUNTER — Inpatient Hospital Stay (HOSPITAL_COMMUNITY)
Admission: AD | Admit: 2017-05-26 | Discharge: 2017-06-01 | DRG: 326 | Disposition: A | Discharge status: Home / Self Care | Payer: Medicare Other | Source: Ambulatory Visit | Attending: Surgery | Admitting: Surgery

## 2017-05-26 ENCOUNTER — Ambulatory Visit (HOSPITAL_COMMUNITY): Payer: Medicare Other | Admitting: Certified Registered Nurse Anesthetist

## 2017-05-26 ENCOUNTER — Encounter (HOSPITAL_COMMUNITY): Payer: Self-pay

## 2017-05-26 ENCOUNTER — Ambulatory Visit (HOSPITAL_COMMUNITY): Payer: Medicare Other

## 2017-05-26 ENCOUNTER — Encounter (HOSPITAL_COMMUNITY)
Admission: AD | Disposition: A | Discharge status: Home / Self Care | Payer: Self-pay | Source: Ambulatory Visit | Attending: Surgery

## 2017-05-26 DIAGNOSIS — K440 Diaphragmatic hernia with obstruction, without gangrene: Principal | ICD-10-CM | POA: Diagnosis present

## 2017-05-26 DIAGNOSIS — K267 Chronic duodenal ulcer without hemorrhage or perforation: Secondary | ICD-10-CM | POA: Diagnosis present

## 2017-05-26 DIAGNOSIS — Y738 Miscellaneous gastroenterology and urology devices associated with adverse incidents, not elsewhere classified: Secondary | ICD-10-CM | POA: Diagnosis not present

## 2017-05-26 DIAGNOSIS — Z8614 Personal history of Methicillin resistant Staphylococcus aureus infection: Secondary | ICD-10-CM

## 2017-05-26 DIAGNOSIS — Q790 Congenital diaphragmatic hernia: Secondary | ICD-10-CM | POA: Diagnosis not present

## 2017-05-26 DIAGNOSIS — N182 Chronic kidney disease, stage 2 (mild): Secondary | ICD-10-CM | POA: Diagnosis present

## 2017-05-26 DIAGNOSIS — I959 Hypotension, unspecified: Secondary | ICD-10-CM | POA: Diagnosis not present

## 2017-05-26 DIAGNOSIS — J439 Emphysema, unspecified: Secondary | ICD-10-CM | POA: Diagnosis present

## 2017-05-26 DIAGNOSIS — J9811 Atelectasis: Secondary | ICD-10-CM | POA: Diagnosis not present

## 2017-05-26 DIAGNOSIS — M797 Fibromyalgia: Secondary | ICD-10-CM | POA: Diagnosis present

## 2017-05-26 DIAGNOSIS — I10 Essential (primary) hypertension: Secondary | ICD-10-CM

## 2017-05-26 DIAGNOSIS — Z923 Personal history of irradiation: Secondary | ICD-10-CM | POA: Diagnosis not present

## 2017-05-26 DIAGNOSIS — R0602 Shortness of breath: Secondary | ICD-10-CM

## 2017-05-26 DIAGNOSIS — Z9221 Personal history of antineoplastic chemotherapy: Secondary | ICD-10-CM | POA: Diagnosis not present

## 2017-05-26 DIAGNOSIS — I129 Hypertensive chronic kidney disease with stage 1 through stage 4 chronic kidney disease, or unspecified chronic kidney disease: Secondary | ICD-10-CM | POA: Diagnosis present

## 2017-05-26 DIAGNOSIS — K311 Adult hypertrophic pyloric stenosis: Secondary | ICD-10-CM | POA: Diagnosis present

## 2017-05-26 DIAGNOSIS — R278 Other lack of coordination: Secondary | ICD-10-CM | POA: Diagnosis not present

## 2017-05-26 DIAGNOSIS — I361 Nonrheumatic tricuspid (valve) insufficiency: Secondary | ICD-10-CM | POA: Diagnosis not present

## 2017-05-26 DIAGNOSIS — T83031A Leakage of indwelling urethral catheter, initial encounter: Secondary | ICD-10-CM | POA: Diagnosis not present

## 2017-05-26 DIAGNOSIS — K219 Gastro-esophageal reflux disease without esophagitis: Secondary | ICD-10-CM | POA: Diagnosis present

## 2017-05-26 DIAGNOSIS — D649 Anemia, unspecified: Secondary | ICD-10-CM | POA: Diagnosis present

## 2017-05-26 DIAGNOSIS — I739 Peripheral vascular disease, unspecified: Secondary | ICD-10-CM | POA: Diagnosis present

## 2017-05-26 DIAGNOSIS — K449 Diaphragmatic hernia without obstruction or gangrene: Secondary | ICD-10-CM | POA: Diagnosis not present

## 2017-05-26 DIAGNOSIS — D72829 Elevated white blood cell count, unspecified: Secondary | ICD-10-CM

## 2017-05-26 DIAGNOSIS — N170 Acute kidney failure with tubular necrosis: Secondary | ICD-10-CM | POA: Diagnosis not present

## 2017-05-26 DIAGNOSIS — R0902 Hypoxemia: Secondary | ICD-10-CM | POA: Diagnosis not present

## 2017-05-26 DIAGNOSIS — Z6841 Body Mass Index (BMI) 40.0 and over, adult: Secondary | ICD-10-CM

## 2017-05-26 DIAGNOSIS — R34 Anuria and oliguria: Secondary | ICD-10-CM | POA: Diagnosis not present

## 2017-05-26 DIAGNOSIS — Z09 Encounter for follow-up examination after completed treatment for conditions other than malignant neoplasm: Secondary | ICD-10-CM

## 2017-05-26 DIAGNOSIS — J90 Pleural effusion, not elsewhere classified: Secondary | ICD-10-CM | POA: Diagnosis not present

## 2017-05-26 DIAGNOSIS — G4733 Obstructive sleep apnea (adult) (pediatric): Secondary | ICD-10-CM | POA: Diagnosis present

## 2017-05-26 DIAGNOSIS — F419 Anxiety disorder, unspecified: Secondary | ICD-10-CM | POA: Diagnosis present

## 2017-05-26 DIAGNOSIS — F329 Major depressive disorder, single episode, unspecified: Secondary | ICD-10-CM | POA: Diagnosis present

## 2017-05-26 DIAGNOSIS — N179 Acute kidney failure, unspecified: Secondary | ICD-10-CM

## 2017-05-26 DIAGNOSIS — K649 Unspecified hemorrhoids: Secondary | ICD-10-CM | POA: Diagnosis present

## 2017-05-26 DIAGNOSIS — Z5111 Encounter for antineoplastic chemotherapy: Secondary | ICD-10-CM

## 2017-05-26 DIAGNOSIS — J329 Chronic sinusitis, unspecified: Secondary | ICD-10-CM | POA: Diagnosis not present

## 2017-05-26 DIAGNOSIS — G4700 Insomnia, unspecified: Secondary | ICD-10-CM | POA: Diagnosis present

## 2017-05-26 DIAGNOSIS — C7951 Secondary malignant neoplasm of bone: Secondary | ICD-10-CM | POA: Diagnosis present

## 2017-05-26 DIAGNOSIS — C3490 Malignant neoplasm of unspecified part of unspecified bronchus or lung: Secondary | ICD-10-CM | POA: Diagnosis present

## 2017-05-26 DIAGNOSIS — Z87891 Personal history of nicotine dependence: Secondary | ICD-10-CM

## 2017-05-26 DIAGNOSIS — Z9119 Patient's noncompliance with other medical treatment and regimen: Secondary | ICD-10-CM | POA: Diagnosis not present

## 2017-05-26 DIAGNOSIS — C3491 Malignant neoplasm of unspecified part of right bronchus or lung: Secondary | ICD-10-CM | POA: Diagnosis not present

## 2017-05-26 DIAGNOSIS — Z8249 Family history of ischemic heart disease and other diseases of the circulatory system: Secondary | ICD-10-CM

## 2017-05-26 DIAGNOSIS — J9859 Other diseases of mediastinum, not elsewhere classified: Secondary | ICD-10-CM | POA: Diagnosis not present

## 2017-05-26 HISTORY — PX: XI ROBOTIC ASSISTED PARASTOMAL HERNIA REPAIR: SHX6540

## 2017-05-26 LAB — TYPE AND SCREEN
ABO/RH(D): O POS
Antibody Screen: NEGATIVE

## 2017-05-26 LAB — MRSA PCR SCREENING: MRSA by PCR: NEGATIVE

## 2017-05-26 SURGERY — REPAIR, HERNIA, PARASTOMAL, ROBOT-ASSISTED
Anesthesia: General | Site: Abdomen

## 2017-05-26 MED ORDER — ROCURONIUM BROMIDE 10 MG/ML (PF) SYRINGE
PREFILLED_SYRINGE | INTRAVENOUS | Status: DC | PRN
  Administered 2017-05-26: 20 mg via INTRAVENOUS
  Administered 2017-05-26: 10 mg via INTRAVENOUS
  Administered 2017-05-26: 60 mg via INTRAVENOUS
  Administered 2017-05-26 (×2): 10 mg via INTRAVENOUS

## 2017-05-26 MED ORDER — BUPIVACAINE-EPINEPHRINE 0.5% -1:200000 IJ SOLN
INTRAMUSCULAR | Status: DC | PRN
  Administered 2017-05-26: 60 mL

## 2017-05-26 MED ORDER — LACTATED RINGERS IV SOLN
1000.0000 mL | Freq: Three times a day (TID) | INTRAVENOUS | Status: DC | PRN
  Administered 2017-05-26: 1000 mL via INTRAVENOUS

## 2017-05-26 MED ORDER — METOCLOPRAMIDE HCL 5 MG/ML IJ SOLN: 10.0000 mg | Freq: Four times a day (QID) | INTRAMUSCULAR | Status: DC | PRN

## 2017-05-26 MED ORDER — LIDOCAINE 2% (20 MG/ML) 5 ML SYRINGE
INTRAMUSCULAR | Status: DC | PRN
  Administered 2017-05-26: 1.5 mg/kg/h via INTRAVENOUS

## 2017-05-26 MED ORDER — 0.9 % SODIUM CHLORIDE (POUR BTL) OPTIME
TOPICAL | Status: DC | PRN
  Administered 2017-05-26: 2000 mL

## 2017-05-26 MED ORDER — ALBUMIN HUMAN 5 % IV SOLN
INTRAVENOUS | Status: AC
  Filled 2017-05-26: qty 250

## 2017-05-26 MED ORDER — ALBUMIN HUMAN 5 % IV SOLN
12.5000 g | Freq: Once | INTRAVENOUS | Status: AC
  Administered 2017-05-26: 12.5 g via INTRAVENOUS

## 2017-05-26 MED ORDER — LIDOCAINE 2% (20 MG/ML) 5 ML SYRINGE
INTRAMUSCULAR | Status: AC
  Filled 2017-05-26: qty 5

## 2017-05-26 MED ORDER — ONDANSETRON HCL 4 MG/2ML IJ SOLN: 4.0000 mg | Freq: Four times a day (QID) | INTRAMUSCULAR | Status: DC | PRN

## 2017-05-26 MED ORDER — SCOPOLAMINE 1 MG/3DAYS TD PT72
MEDICATED_PATCH | TRANSDERMAL | Status: AC
  Filled 2017-05-26: qty 1

## 2017-05-26 MED ORDER — SODIUM CHLORIDE 0.9 % IV SOLN
8.0000 mg | Freq: Four times a day (QID) | INTRAVENOUS | Status: DC | PRN
  Filled 2017-05-26: qty 4

## 2017-05-26 MED ORDER — ONDANSETRON HCL 4 MG/2ML IJ SOLN
INTRAMUSCULAR | Status: AC
  Filled 2017-05-26: qty 2

## 2017-05-26 MED ORDER — LIDOCAINE HCL 2 % IJ SOLN
INTRAMUSCULAR | Status: AC
  Filled 2017-05-26: qty 20

## 2017-05-26 MED ORDER — ONDANSETRON HCL 4 MG PO TABS: 4.0000 mg | ORAL_TABLET | Freq: Three times a day (TID) | ORAL | 5 refills | Status: AC | PRN

## 2017-05-26 MED ORDER — MIDAZOLAM HCL 2 MG/2ML IJ SOLN
INTRAMUSCULAR | Status: AC
  Filled 2017-05-26: qty 2

## 2017-05-26 MED ORDER — ONDANSETRON 4 MG PO TBDP: 4.0000 mg | ORAL_TABLET | Freq: Four times a day (QID) | ORAL | Status: DC | PRN

## 2017-05-26 MED ORDER — PHENYLEPHRINE 40 MCG/ML (10ML) SYRINGE FOR IV PUSH (FOR BLOOD PRESSURE SUPPORT)
PREFILLED_SYRINGE | INTRAVENOUS | Status: DC | PRN
  Administered 2017-05-26 (×5): 80 ug via INTRAVENOUS

## 2017-05-26 MED ORDER — POLYETHYLENE GLYCOL 3350 17 G PO PACK: 17.0000 g | PACK | Freq: Every day | ORAL | Status: DC | PRN

## 2017-05-26 MED ORDER — CELECOXIB 200 MG PO CAPS
400.0000 mg | ORAL_CAPSULE | ORAL | Status: AC
  Administered 2017-05-26: 400 mg via ORAL
  Filled 2017-05-26: qty 2

## 2017-05-26 MED ORDER — ONDANSETRON HCL 4 MG/2ML IJ SOLN
4.0000 mg | Freq: Four times a day (QID) | INTRAMUSCULAR | Status: DC | PRN
  Administered 2017-05-30 (×2): 4 mg via INTRAVENOUS
  Filled 2017-05-26 (×2): qty 2

## 2017-05-26 MED ORDER — FENTANYL CITRATE (PF) 250 MCG/5ML IJ SOLN
INTRAMUSCULAR | Status: AC
  Filled 2017-05-26: qty 5

## 2017-05-26 MED ORDER — BUPIVACAINE LIPOSOME 1.3 % IJ SUSP
INTRAMUSCULAR | Status: DC | PRN
  Administered 2017-05-26: 20 mL

## 2017-05-26 MED ORDER — ACETAMINOPHEN 500 MG PO TABS
1000.0000 mg | ORAL_TABLET | Freq: Three times a day (TID) | ORAL | Status: DC
  Administered 2017-05-26 – 2017-05-29 (×3): 1000 mg via ORAL
  Filled 2017-05-26 (×7): qty 2

## 2017-05-26 MED ORDER — SUCCINYLCHOLINE CHLORIDE 200 MG/10ML IV SOSY
PREFILLED_SYRINGE | INTRAVENOUS | Status: AC
  Filled 2017-05-26: qty 10

## 2017-05-26 MED ORDER — BUPIVACAINE-EPINEPHRINE (PF) 0.5% -1:200000 IJ SOLN
INTRAMUSCULAR | Status: AC
  Filled 2017-05-26: qty 30

## 2017-05-26 MED ORDER — HYDROCORTISONE 2.5 % RE CREA
1.0000 | TOPICAL_CREAM | Freq: Four times a day (QID) | RECTAL | Status: DC | PRN
Start: 2017-05-26 — End: 2017-06-01
  Filled 2017-05-26: qty 28.35

## 2017-05-26 MED ORDER — HYDROMORPHONE HCL 1 MG/ML IJ SOLN
0.5000 mg | INTRAMUSCULAR | Status: DC | PRN
  Administered 2017-05-27: 0.5 mg via INTRAVENOUS
  Administered 2017-05-27 – 2017-05-30 (×4): 1 mg via INTRAVENOUS
  Filled 2017-05-26 (×7): qty 1

## 2017-05-26 MED ORDER — METOCLOPRAMIDE HCL 10 MG PO TABS: 10.0000 mg | ORAL_TABLET | Freq: Four times a day (QID) | ORAL | Status: DC | PRN

## 2017-05-26 MED ORDER — DIPHENHYDRAMINE HCL 50 MG/ML IJ SOLN: 12.5000 mg | Freq: Four times a day (QID) | INTRAMUSCULAR | Status: DC | PRN

## 2017-05-26 MED ORDER — FENTANYL CITRATE (PF) 100 MCG/2ML IJ SOLN
INTRAMUSCULAR | Status: AC
  Filled 2017-05-26: qty 2

## 2017-05-26 MED ORDER — SUCCINYLCHOLINE CHLORIDE 200 MG/10ML IV SOSY
PREFILLED_SYRINGE | INTRAVENOUS | Status: DC | PRN
  Administered 2017-05-26: 140 mg via INTRAVENOUS

## 2017-05-26 MED ORDER — SUGAMMADEX SODIUM 200 MG/2ML IV SOLN
INTRAVENOUS | Status: DC | PRN
  Administered 2017-05-26: 400 mg via INTRAVENOUS

## 2017-05-26 MED ORDER — FENTANYL CITRATE (PF) 100 MCG/2ML IJ SOLN
25.0000 ug | INTRAMUSCULAR | Status: DC | PRN
  Administered 2017-05-26: 25 ug via INTRAVENOUS
  Administered 2017-05-26: 50 ug via INTRAVENOUS
  Administered 2017-05-26: 25 ug via INTRAVENOUS
  Administered 2017-05-26: 50 ug via INTRAVENOUS

## 2017-05-26 MED ORDER — LACTATED RINGERS IV BOLUS (SEPSIS)
1000.0000 mL | Freq: Once | INTRAVENOUS | Status: AC
  Administered 2017-05-26: 1000 mL via INTRAVENOUS

## 2017-05-26 MED ORDER — ONDANSETRON HCL 4 MG/2ML IJ SOLN: 4.0000 mg | Freq: Once | INTRAMUSCULAR | Status: DC | PRN

## 2017-05-26 MED ORDER — SODIUM CHLORIDE 0.9 % IV SOLN
2.0000 g | INTRAVENOUS | Status: AC
  Administered 2017-05-26: 2 g via INTRAVENOUS
  Filled 2017-05-26: qty 20

## 2017-05-26 MED ORDER — METHOCARBAMOL 500 MG PO TABS
750.0000 mg | ORAL_TABLET | Freq: Four times a day (QID) | ORAL | Status: DC | PRN
  Administered 2017-05-28 (×2): 750 mg via ORAL
  Filled 2017-05-26 (×2): qty 2

## 2017-05-26 MED ORDER — MIDAZOLAM HCL 5 MG/5ML IJ SOLN
INTRAMUSCULAR | Status: DC | PRN
  Administered 2017-05-26 (×2): 1 mg via INTRAVENOUS

## 2017-05-26 MED ORDER — PHENYLEPHRINE HCL 10 MG/ML IJ SOLN
INTRAVENOUS | Status: DC | PRN
  Administered 2017-05-26: 50 ug/min via INTRAVENOUS

## 2017-05-26 MED ORDER — LACTATED RINGERS IV BOLUS (SEPSIS)
1000.0000 mL | Freq: Three times a day (TID) | INTRAVENOUS | Status: DC | PRN
  Administered 2017-05-27 (×2): 1000 mL via INTRAVENOUS

## 2017-05-26 MED ORDER — FENTANYL CITRATE (PF) 100 MCG/2ML IJ SOLN
INTRAMUSCULAR | Status: DC | PRN
  Administered 2017-05-26 (×4): 50 ug via INTRAVENOUS
  Administered 2017-05-26: 100 ug via INTRAVENOUS

## 2017-05-26 MED ORDER — ACETAMINOPHEN 500 MG PO TABS
1000.0000 mg | ORAL_TABLET | ORAL | Status: AC
  Administered 2017-05-26: 1000 mg via ORAL
  Filled 2017-05-26: qty 2

## 2017-05-26 MED ORDER — MENTHOL 3 MG MT LOZG
1.0000 | LOZENGE | OROMUCOSAL | Status: DC | PRN
  Filled 2017-05-26: qty 9

## 2017-05-26 MED ORDER — DEXAMETHASONE SODIUM PHOSPHATE 10 MG/ML IJ SOLN
INTRAMUSCULAR | Status: AC
  Filled 2017-05-26: qty 1

## 2017-05-26 MED ORDER — LACTATED RINGERS IV SOLN
INTRAVENOUS | Status: DC | PRN
  Administered 2017-05-26: 07:00:00 via INTRAVENOUS

## 2017-05-26 MED ORDER — SUGAMMADEX SODIUM 500 MG/5ML IV SOLN
INTRAVENOUS | Status: AC
  Filled 2017-05-26: qty 5

## 2017-05-26 MED ORDER — ZOLPIDEM TARTRATE 5 MG PO TABS: 5.0000 mg | ORAL_TABLET | Freq: Every evening | ORAL | Status: DC | PRN

## 2017-05-26 MED ORDER — GUAIFENESIN-DM 100-10 MG/5ML PO SYRP
10.0000 mL | ORAL_SOLUTION | ORAL | Status: DC | PRN
Start: 2017-05-26 — End: 2017-05-28

## 2017-05-26 MED ORDER — CEFOTETAN DISODIUM-DEXTROSE 2-2.08 GM-%(50ML) IV SOLR
2.0000 g | Freq: Two times a day (BID) | INTRAVENOUS | Status: AC
  Administered 2017-05-26: 2 g via INTRAVENOUS
  Filled 2017-05-26: qty 50

## 2017-05-26 MED ORDER — SIMETHICONE 80 MG PO CHEW
40.0000 mg | CHEWABLE_TABLET | Freq: Four times a day (QID) | ORAL | Status: DC | PRN
  Filled 2017-05-26: qty 1

## 2017-05-26 MED ORDER — LACTATED RINGERS IV SOLN
INTRAVENOUS | Status: DC | PRN
  Administered 2017-05-26 (×3): via INTRAVENOUS

## 2017-05-26 MED ORDER — METOPROLOL TARTRATE 5 MG/5ML IV SOLN: 5.0000 mg | Freq: Four times a day (QID) | INTRAVENOUS | Status: DC | PRN

## 2017-05-26 MED ORDER — LIP MEDEX EX OINT
1.0000 "application " | TOPICAL_OINTMENT | Freq: Two times a day (BID) | CUTANEOUS | Status: DC
  Administered 2017-05-26: 1 via TOPICAL
  Filled 2017-05-26 (×3): qty 7

## 2017-05-26 MED ORDER — ALBUMIN HUMAN 5 % IV SOLN
INTRAVENOUS | Status: AC
Start: 2017-05-26 — End: 2017-05-26
  Filled 2017-05-26: qty 250

## 2017-05-26 MED ORDER — DEXAMETHASONE SODIUM PHOSPHATE 4 MG/ML IJ SOLN
4.0000 mg | INTRAMUSCULAR | Status: AC
  Administered 2017-05-26: 4 mg via INTRAVENOUS

## 2017-05-26 MED ORDER — METRONIDAZOLE IN NACL 5-0.79 MG/ML-% IV SOLN
500.0000 mg | INTRAVENOUS | Status: AC
  Administered 2017-05-26: 500 mg via INTRAVENOUS
  Filled 2017-05-26: qty 100

## 2017-05-26 MED ORDER — SCOPOLAMINE 1 MG/3DAYS TD PT72
1.0000 | MEDICATED_PATCH | TRANSDERMAL | Status: AC
  Administered 2017-05-26: 1.5 mg via TRANSDERMAL
  Administered 2017-05-26: 1 via TRANSDERMAL
  Filled 2017-05-26: qty 1

## 2017-05-26 MED ORDER — GABAPENTIN 300 MG PO CAPS
300.0000 mg | ORAL_CAPSULE | ORAL | Status: AC
  Administered 2017-05-26: 300 mg via ORAL
  Filled 2017-05-26: qty 1

## 2017-05-26 MED ORDER — GABAPENTIN 300 MG PO CAPS
300.0000 mg | ORAL_CAPSULE | Freq: Two times a day (BID) | ORAL | Status: DC
  Administered 2017-05-26: 300 mg via ORAL
  Filled 2017-05-26: qty 1

## 2017-05-26 MED ORDER — KETAMINE HCL 10 MG/ML IJ SOLN
INTRAMUSCULAR | Status: DC | PRN
  Administered 2017-05-26: 25 mg via INTRAVENOUS

## 2017-05-26 MED ORDER — EPHEDRINE 5 MG/ML INJ
INTRAVENOUS | Status: AC
  Filled 2017-05-26: qty 10

## 2017-05-26 MED ORDER — ROCURONIUM BROMIDE 10 MG/ML (PF) SYRINGE
PREFILLED_SYRINGE | INTRAVENOUS | Status: AC
  Filled 2017-05-26: qty 5

## 2017-05-26 MED ORDER — SODIUM CHLORIDE 0.9 % IV SOLN: INTRAVENOUS | Status: DC

## 2017-05-26 MED ORDER — PROPOFOL 10 MG/ML IV BOLUS
INTRAVENOUS | Status: DC | PRN
  Administered 2017-05-26: 150 mg via INTRAVENOUS

## 2017-05-26 MED ORDER — PHENOL 1.4 % MT LIQD
1.0000 | OROMUCOSAL | Status: DC | PRN
  Filled 2017-05-26: qty 177

## 2017-05-26 MED ORDER — PHENYLEPHRINE HCL 10 MG/ML IJ SOLN
INTRAMUSCULAR | Status: AC
  Filled 2017-05-26: qty 1

## 2017-05-26 MED ORDER — ALBUMIN HUMAN 5 % IV SOLN
INTRAVENOUS | Status: DC | PRN
  Administered 2017-05-26: 11:00:00 via INTRAVENOUS

## 2017-05-26 MED ORDER — PROPOFOL 10 MG/ML IV BOLUS
INTRAVENOUS | Status: AC
  Filled 2017-05-26: qty 40

## 2017-05-26 MED ORDER — HYDRALAZINE HCL 20 MG/ML IJ SOLN: 5.0000 mg | INTRAMUSCULAR | Status: DC | PRN

## 2017-05-26 MED ORDER — PROCHLORPERAZINE EDISYLATE 5 MG/ML IJ SOLN: 5.0000 mg | Freq: Four times a day (QID) | INTRAMUSCULAR | Status: DC | PRN

## 2017-05-26 MED ORDER — ALUM & MAG HYDROXIDE-SIMETH 200-200-20 MG/5ML PO SUSP
30.0000 mL | Freq: Four times a day (QID) | ORAL | Status: DC | PRN
Start: 2017-05-26 — End: 2017-05-30

## 2017-05-26 MED ORDER — HYDROCORTISONE 1 % EX CREA
1.0000 "application " | TOPICAL_CREAM | Freq: Three times a day (TID) | CUTANEOUS | Status: DC | PRN
  Filled 2017-05-26: qty 28

## 2017-05-26 MED ORDER — KETAMINE HCL 10 MG/ML IJ SOLN
INTRAMUSCULAR | Status: AC
  Filled 2017-05-26: qty 1

## 2017-05-26 MED ORDER — DIPHENHYDRAMINE HCL 12.5 MG/5ML PO ELIX: 12.5000 mg | ORAL_SOLUTION | Freq: Four times a day (QID) | ORAL | Status: DC | PRN

## 2017-05-26 MED ORDER — MAGIC MOUTHWASH
15.0000 mL | Freq: Four times a day (QID) | ORAL | Status: DC | PRN
Start: 2017-05-26 — End: 2017-06-01
  Filled 2017-05-26: qty 15

## 2017-05-26 MED ORDER — ONDANSETRON HCL 4 MG/2ML IJ SOLN
INTRAMUSCULAR | Status: DC | PRN
  Administered 2017-05-26: 4 mg via INTRAVENOUS

## 2017-05-26 MED ORDER — OXYCODONE HCL 5 MG PO TABS
5.0000 mg | ORAL_TABLET | ORAL | Status: DC | PRN
  Administered 2017-05-26 – 2017-05-28 (×4): 5 mg via ORAL
  Administered 2017-05-29 – 2017-05-30 (×3): 10 mg via ORAL
  Filled 2017-05-26: qty 2
  Filled 2017-05-26 (×2): qty 1
  Filled 2017-05-26 (×2): qty 2
  Filled 2017-05-26: qty 1
  Filled 2017-05-26: qty 2

## 2017-05-26 MED ORDER — PROCHLORPERAZINE MALEATE 10 MG PO TABS: 10.0000 mg | ORAL_TABLET | Freq: Four times a day (QID) | ORAL | Status: DC | PRN

## 2017-05-26 MED ORDER — PSYLLIUM 95 % PO PACK
1.0000 | PACK | Freq: Every day | ORAL | Status: DC
  Administered 2017-05-26: 1 via ORAL
  Filled 2017-05-26 (×2): qty 1

## 2017-05-26 MED ORDER — BISACODYL 10 MG RE SUPP: 10.0000 mg | Freq: Every day | RECTAL | Status: DC | PRN

## 2017-05-26 MED ORDER — ENOXAPARIN SODIUM 40 MG/0.4ML ~~LOC~~ SOLN
40.0000 mg | SUBCUTANEOUS | Status: DC
  Administered 2017-05-27: 40 mg via SUBCUTANEOUS
  Filled 2017-05-26: qty 0.4

## 2017-05-26 MED ORDER — SODIUM CHLORIDE 0.9 % IV SOLN
25.0000 mg | Freq: Four times a day (QID) | INTRAVENOUS | Status: DC | PRN
  Filled 2017-05-26: qty 1

## 2017-05-26 MED ORDER — EPHEDRINE SULFATE-NACL 50-0.9 MG/10ML-% IV SOSY
PREFILLED_SYRINGE | INTRAVENOUS | Status: DC | PRN
  Administered 2017-05-26 (×2): 5 mg via INTRAVENOUS

## 2017-05-26 SURGICAL SUPPLY — 67 items
APPLICATOR COTTON TIP 6IN STRL (MISCELLANEOUS) ×4 IMPLANT
APPLIER CLIP 5 13 M/L LIGAMAX5 (MISCELLANEOUS)
APPLIER CLIP ROT 10 11.4 M/L (STAPLE)
BLADE SURG SZ11 CARB STEEL (BLADE) ×4 IMPLANT
CHLORAPREP W/TINT 26ML (MISCELLANEOUS) ×4 IMPLANT
CLIP APPLIE 5 13 M/L LIGAMAX5 (MISCELLANEOUS) IMPLANT
CLIP APPLIE ROT 10 11.4 M/L (STAPLE) IMPLANT
CLOSURE WOUND 1/2 X4 (GAUZE/BANDAGES/DRESSINGS) ×1
COVER SURGICAL LIGHT HANDLE (MISCELLANEOUS) ×4 IMPLANT
COVER TIP SHEARS 8 DVNC (MISCELLANEOUS) ×2 IMPLANT
COVER TIP SHEARS 8MM DA VINCI (MISCELLANEOUS) ×2
DECANTER SPIKE VIAL GLASS SM (MISCELLANEOUS) ×4 IMPLANT
DEVICE PMI PUNCTURE CLOSURE (MISCELLANEOUS) ×4 IMPLANT
DRAIN CHANNEL 19F RND (DRAIN) ×4 IMPLANT
DRAIN PENROSE 18X1/2 LTX STRL (DRAIN) IMPLANT
DRAPE ARM DVNC X/XI (DISPOSABLE) ×8 IMPLANT
DRAPE COLUMN DVNC XI (DISPOSABLE) ×2 IMPLANT
DRAPE DA VINCI XI ARM (DISPOSABLE) ×8
DRAPE DA VINCI XI COLUMN (DISPOSABLE) ×2
DRAPE WARM FLUID 44X44 (DRAPE) ×4 IMPLANT
DRSG TEGADERM 2-3/8X2-3/4 SM (GAUZE/BANDAGES/DRESSINGS) ×4 IMPLANT
DRSG TEGADERM 4X4.75 (GAUZE/BANDAGES/DRESSINGS) ×4 IMPLANT
ELECT REM PT RETURN 15FT ADLT (MISCELLANEOUS) ×4 IMPLANT
ENDOLOOP SUT PDS II  0 18 (SUTURE)
ENDOLOOP SUT PDS II 0 18 (SUTURE) IMPLANT
EVACUATOR SILICONE 100CC (DRAIN) IMPLANT
GAUZE SPONGE 2X2 8PLY STRL LF (GAUZE/BANDAGES/DRESSINGS) ×2 IMPLANT
GLOVE ECLIPSE 8.0 STRL XLNG CF (GLOVE) ×8 IMPLANT
GLOVE INDICATOR 8.0 STRL GRN (GLOVE) ×8 IMPLANT
GOWN STRL REUS W/TWL XL LVL3 (GOWN DISPOSABLE) ×16 IMPLANT
IRRIG SUCT STRYKERFLOW 2 WTIP (MISCELLANEOUS) ×4
IRRIGATION SUCT STRKRFLW 2 WTP (MISCELLANEOUS) ×2 IMPLANT
KIT BASIN OR (CUSTOM PROCEDURE TRAY) ×4 IMPLANT
MESH VENTRALIGHT ST 8X10 (Mesh General) ×4 IMPLANT
NEEDLE HYPO 22GX1.5 SAFETY (NEEDLE) ×4 IMPLANT
NEEDLE INSUFFLATION 14GA 120MM (NEEDLE) ×4 IMPLANT
PACK CARDIOVASCULAR III (CUSTOM PROCEDURE TRAY) ×4 IMPLANT
PAD POSITIONING PINK XL (MISCELLANEOUS) ×4 IMPLANT
SCISSORS LAP 5X45 EPIX DISP (ENDOMECHANICALS) ×4 IMPLANT
SEAL CANN UNIV 5-8 DVNC XI (MISCELLANEOUS) ×8 IMPLANT
SEAL XI 5MM-8MM UNIVERSAL (MISCELLANEOUS) ×8
SEALER VESSEL DA VINCI XI (MISCELLANEOUS) ×2
SEALER VESSEL EXT DVNC XI (MISCELLANEOUS) ×2 IMPLANT
SOLUTION ELECTROLUBE (MISCELLANEOUS) ×4 IMPLANT
SPONGE GAUZE 2X2 STER 10/PKG (GAUZE/BANDAGES/DRESSINGS) ×2
SPONGE LAP 18X18 X RAY DECT (DISPOSABLE) ×4 IMPLANT
STRIP CLOSURE SKIN 1/2X4 (GAUZE/BANDAGES/DRESSINGS) ×3 IMPLANT
SUT ETHIBOND 0 36 GRN (SUTURE) ×12 IMPLANT
SUT ETHIBOND NAB CT1 #1 30IN (SUTURE) ×8 IMPLANT
SUT MNCRL AB 4-0 PS2 18 (SUTURE) ×4 IMPLANT
SUT PROLENE 1 CT 1 30 (SUTURE) ×32 IMPLANT
SUT PROLENE 2 0 SH DA (SUTURE) IMPLANT
SUT V-LOC BARB 180 2/0GR6 GS22 (SUTURE)
SUTURE V-LC BRB 180 2/0GR6GS22 (SUTURE) IMPLANT
SYR 10ML LL (SYRINGE) ×4 IMPLANT
SYR 20CC LL (SYRINGE) ×4 IMPLANT
TAPE STRIPS DRAPE STRL (GAUZE/BANDAGES/DRESSINGS) ×4 IMPLANT
TIP INNERVISION DETACH 40FR (MISCELLANEOUS) IMPLANT
TIP INNERVISION DETACH 50FR (MISCELLANEOUS) IMPLANT
TIP INNERVISION DETACH 56FR (MISCELLANEOUS) IMPLANT
TIPS INNERVISION DETACH 40FR (MISCELLANEOUS)
TOWEL OR 17X26 10 PK STRL BLUE (TOWEL DISPOSABLE) ×4 IMPLANT
TOWEL OR NON WOVEN STRL DISP B (DISPOSABLE) ×4 IMPLANT
TRAY FOLEY W/METER SILVER 16FR (SET/KITS/TRAYS/PACK) IMPLANT
TROCAR ADV FIXATION 5X100MM (TROCAR) ×4 IMPLANT
TROCAR XCEL 12X100 BLDLESS (ENDOMECHANICALS) ×4 IMPLANT
TUBING INSUFFLATION 10FT LAP (TUBING) ×4 IMPLANT

## 2017-05-26 NOTE — Progress Notes (Signed)
Dr Ola Spurr at bedside.

## 2017-05-26 NOTE — Anesthesia Procedure Notes (Signed)
Procedure Name: Intubation Date/Time: 05/26/2017 7:54 AM Performed by: Claudia Desanctis, CRNA Pre-anesthesia Checklist: Patient identified, Emergency Drugs available, Suction available and Patient being monitored Patient Re-evaluated:Patient Re-evaluated prior to induction Oxygen Delivery Method: Circle system utilized Preoxygenation: Pre-oxygenation with 100% oxygen Induction Type: IV induction Ventilation: Mask ventilation without difficulty Laryngoscope Size: 2 and Miller Grade View: Grade I Tube type: Oral Tube size: 7.0 mm Number of attempts: 1 Airway Equipment and Method: Stylet Placement Confirmation: ETT inserted through vocal cords under direct vision,  positive ETCO2 and breath sounds checked- equal and bilateral Secured at: 22 cm Tube secured with: Tape Dental Injury: Teeth and Oropharynx as per pre-operative assessment

## 2017-05-26 NOTE — Progress Notes (Signed)
Dr Ola Spurr at bedside. Aware pt continues with low UOP. 2nd Albumin bolus infusing. Will monitor.

## 2017-05-26 NOTE — Interval H&P Note (Signed)
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 After consideration of risks, benefits and other options for treatment, the patient has consented to  Procedure(s): XI ROBOTIC [place] (N/A) INSERTION OF MESH (N/A) as a surgical intervention .    I have re-reviewed the the patient's records, history, medications, and allergies.  I have re-examined the patient.  I again discussed intraoperative plans and goals of post-operative recovery.  The patient agrees to proceed.  Beth [name]  1960/05/11 [id number]  Patient Care Team: [name], MD as PCP - Huston Foley, MD as Consulting Physician (General Surgery)  Patient Active Problem List   Diagnosis Date Noted  . 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 Flu-like symptoms 05/12/2016  . Hx of bacterial endocarditis 02/28/2016  . Acute upper respiratory infection 02/28/2016  . Morbid (severe) obesity due to excess calories ([place]) 01/20/2016  . Cough variant asthma vs UACS 12/09/2015  . Encounter for antineoplastic chemotherapy 01/06/2015  . History of MRSA infection 11/13/2014  . Sinusitis 09/11/2014  . Elevated brain natriuretic peptide (BNP) level 10/09/2011  . Pleural effusion 09/01/2011  . Anxiety 07/12/2011  . Vitamin D deficiency 06/23/2011  . Normocytic anemia 06/23/2011  . Asthma, mild intermittent 04/12/2011  . History of depression 01/21/2011  . Overweight 12/05/2009  . Essential hypertension, benign 12/05/2009  . GERD 12/05/2009  . MURMUR 12/05/2009    Past Medical History:  Diagnosis Date  .  Anemia   . Anxiety   . Aortic atherosclerosis ([place])   . Arthritis of knee, degenerative   . Asthma   . Complication of anesthesia    difficulty waking up, throat feels wierd  . Depression   . Dermatitis   . Emphysema of lung ([place])   . Endocarditis    as teenager  . Fibromyalgia   . Flu-like symptoms 05/12/2016  . GERD (gastroesophageal reflux disease)   . Heart murmur   . History of MRSA infection   . History of radiation therapy 09/29/11-11/04/2011   right lung 2700cGy 15 sessions  . Hypertension   . Lung mass    R- ADENOCARCINOMA  . Morbid obesity ([place])   . Osseous metastasis ([place]) 09/20/11   per PET scan, Lung  . Pleural effusion 08/30/11  . Shortness of breath   . Sleep apnea    no longer using CPAP  . Tuberculosis    +PPD took treatments years ago    Past Surgical History:  Procedure Laterality Date  . THORACENTESIS  09/02/11, 09/09/11   right-side pleural effusion  . TUBAL LIGATION  2002    Social History   Socioeconomic History  . Marital status: Single    Spouse name: Not on file  . Number of children: Not on file  . Years of education: Not on file  . Highest education level: Not on file  Social Needs  . Financial resource strain: Not on file  . Food insecurity - worry: Not on file  . Food insecurity - inability: Not on file  . Transportation needs - medical: Not on file  . Transportation needs - non-medical: Not on file  Occupational History  . Not on file  Tobacco Use  . Smoking status: Former Smoker    Packs/day: 0.50    Years: 15.00    Pack years: 7.50    Types: Cigarettes    Last attempt to quit: 01/01/2011    Years since quitting: 6.4  . Smokeless tobacco: Never Used  Substance and Sexual Activity  . Alcohol use: Yes    Comment: per H&P, used to drink alcohol regularly  . Drug use: No  . Sexual activity: Not Currently    Birth control/protection: Surgical    Comment: still having menses due this week, menses started age 37, g1,p1, no hrt,  has hot flashes  Other Topics Concern  . Not on file  Social History Narrative   Divorced, 1 son, worked for [employer]    Family History  Problem Relation Age of Onset  . Alcohol abuse Father   . Hypertension Mother   . Hypertension Maternal Aunt   . Hypertension Maternal Uncle   . Hypertension Maternal Grandmother   . Hypertension Maternal Grandfather   . Sarcoidosis Sister     Medications Prior to Admission  Medication Sig Dispense Refill Last Dose  . amLODipine (NORVASC) 5 MG tablet TAKE 1 TABLET BY MOUTH EVERY DAY 90 tablet 1 05/26/2017 at 0400  . calcium carbonate (OSCAL) 1500 (600 Ca) MG TABS tablet Take 2 tablets by mouth daily.   05/25/2017 at Unknown time  . clindamycin (CLEOCIN-T) 1 % external solution Apply topically 2 (two) times daily. Apply to face, chest , arms for rash (Patient taking differently: Apply 1 application topically 3 (three) times daily. Apply to face, chest , arms for rash) 240 mL 0 05/26/2017 at 0400  . COMBIVENT RESPIMAT 20-100 MCG/ACT AERS respimat INHALE 1 PUFF INTO THE LUNGS EVERY 6 HOURS AS NEEDED FOR WHEEZING 4 g 0 05/25/2017 at pm  . diphenhydrAMINE (BENADRYL) 25 MG tablet Take 50 mg by mouth 2 (two) times daily.    05/24/2017  . diphenoxylate-atropine (LOMOTIL) 2.5-0.025 MG tablet TAKE 2 TABLETS BY MOUTH FOUR TIMES DAILY AS NEEDED FOR DIARRHEA OR LOOSE STOOL. ALTERNATE WITH IMMODIUM. MAX OF 8 TABLETS PER DAY 30 tablet 0 Past Week at Unknown time  . doxycycline (VIBRA-TABS) 100 MG tablet Take 1 tablet (100 mg total) by mouth 2 (two) times daily. 14 tablet 0 Past Month at Unknown time  . hydrochlorothiazide (HYDRODIURIL) 25 MG tablet TAKE 1 TABLET BY MOUTH EVERY DAY 90 tablet 0 05/26/2017 at 0400  . hydrOXYzine (ATARAX/VISTARIL) 25 MG tablet TAKE 1 TABLET BY MOUTH EVERY 6 HOURS AS NEEDED FOR ITCHING 30 tablet 0 05/25/2017 at pm  . loperamide (IMODIUM) 2 MG capsule Take 4 mg by mouth 4 (four) times daily as needed for diarrhea or loose stools.    05/24/2017  . Melatonin 5  MG TABS Take 25-30 mg by mouth at bedtime as needed (sleep).   05/24/2017  . Omega-3 Fatty Acids (FISH OIL) 1200 MG CAPS Take 3,600 mg by mouth daily.   05/25/2017 at Unknown time  . Oxycodone HCl 10 MG TABS Take 1 tablet (10 mg total) by mouth every 6 (six) hours as needed (for pain). 90 tablet 0 05/25/2017 at 1600  . Probiotic Product (ALIGN PO) Take 1 capsule by mouth daily.   Past Month at Unknown time  . prochlorperazine (COMPAZINE) 10 MG tablet TAKE ONE TABLET BY MOUTH EVERY 6 HOURS AS NEEDED (Patient taking differently: TAKE ONE TABLET BY MOUTH EVERY 6 HOURS AS NEEDED FOR NAUSEA)  385 tablet 0 05/24/2017  . [name] 150 MG tablet TAKE 1 TABLET BY MOUTH DAILY ON AN EMPTY STOMACH 1 HOUR BEFORE OR 2 HOURS AFTER A MEAL 30 tablet 0 05/26/2017 at 0400  . venlafaxine XR (EFFEXOR-XR) 75 MG 24 hr capsule TAKE 1 CAPSULE(75 MG) BY MOUTH 1 TIME 90 capsule 3 05/25/2017 at pm  . ergocalciferol (VITAMIN D2) 50000 units capsule Take 1 capsule (50,000 Units total) by mouth once a week. (Patient not taking: Reported on 05/12/2017) 12 capsule 3 Not Taking at Unknown time  . furosemide (LASIX) 20 MG tablet TAKE 1 TABLET BY MOUTH DAILY AS NEEDED FOR SEVERE EDEMA 30 tablet 0 More than a month at Unknown time  . hydrOXYzine (ATARAX/VISTARIL) 10 MG tablet TAKE 1 TABLET BY MOUTH THREE TIMES DAILY AS NEEDED FOR ITCHING (Patient not taking: Reported on 05/12/2017) 30 tablet 1 Completed Course at Unknown time  . ketoconazole (NIZORAL) 2 % shampoo APPLY EXTERNALLY 2 TIMES A WEEK 120 mL 0 More than a month at Unknown time  . mirtazapine (REMERON) 30 MG tablet TAKE 1 TABLET(30 MG) BY MOUTH AT BEDTIME (Patient not taking: Reported on 05/12/2017) 30 tablet 2 Not Taking at Unknown time  . mometasone (NASONEX) 50 MCG/ACT nasal spray Place 2 sprays into the nose daily. (Patient not taking: Reported on 05/12/2017) 17 g 12 Not Taking at Unknown time  . NONFORMULARY OR COMPOUNDED ITEM Cbcd, cmp,ua   Dx hx fever, hx endocarditis, 1 each 0 non-med  .  terbinafine (LAMISIL) 250 MG tablet Take 1 tablet (250 mg total) by mouth daily. (Patient not taking: Reported on 05/12/2017) 14 tablet 0 Not Taking at Unknown time  . triamcinolone (KENALOG) 0.025 % cream APPLY EXTERNALLY TO THE AFFECTED AREA TWICE DAILY (Patient not taking: Reported on 05/12/2017) 30 g 0 Not Taking at Unknown time    Current Facility-Administered Medications  Medication Dose Route Frequency Provider Last Rate Last Dose  . bupivacaine liposome (EXPAREL) 1.3 % injection 266 mg  20 mL Infiltration On Call to OR Michael Boston, MD      . cefTRIAXone (ROCEPHIN) 2 g in sodium chloride 0.9 % 100 mL IVPB  2 g Intravenous On Call to OR Michael Boston, MD       And  . metroNIDAZOLE (FLAGYL) IVPB 500 mg  500 mg Intravenous On Call to OR Michael Boston, MD      . dexamethasone (DECADRON) injection 4 mg  4 mg Intravenous On Call to OR Michael Boston, MD      . scopolamine (TRANSDERM-SCOP) 1 MG/3DAYS 1.5 mg  1 patch Transdermal On Call to OR Michael Boston, MD   1.5 mg at 05/26/17 1914     Allergies  Allergen Reactions  . Meloxicam     Possible GI bleed    BP (!) 135/95   Pulse (!) 105   Temp 98.2 F (36.8 C) (Oral)   Resp 16   Ht 5\' 5"  (1.651 m)   Wt 127.9 kg (282 lb)   LMP 09/28/2011   SpO2 95%   BMI 46.93 kg/m   Labs: Results for orders placed or performed during the hospital encounter of 05/24/17 (from the past 48 hour(s))  Type and screen All Cardiac and thoracic surgeries, spinal fusions, myomectomies, craniotomies, colon & liver resections, total joint revisions, same day c-section with placenta previa or accreta.     Status: None   Collection Time: 05/24/17  1:22 PM  Result Value Ref Range   ABO/RH(D) O POS    Antibody  Screen NEG    Sample Expiration 06/07/2017    Extend sample reason      NO TRANSFUSIONS OR PREGNANCY IN THE PAST 3 MONTHS Performed at [facility], [place] [address], [place], [place] [address]   Surgical pcr screen     Status: None    Collection Time: 05/24/17  1:22 PM  Result Value Ref Range   MRSA, PCR NEGATIVE NEGATIVE   Staphylococcus aureus NEGATIVE NEGATIVE    Comment: (NOTE) The Xpert SA Assay (FDA approved for NASAL specimens in patients 39 years of age and older), is one component of a comprehensive surveillance program. It is not intended to diagnose infection nor to guide or monitor treatment. Performed at [facility], [place] [address], [place], [place] [address]    *Note: Due to a large number of results and/or encounters for the requested time period, some results have not been displayed. A complete set of results can be found in Results Review.    Imaging / Studies: No results found.   Adin Hector, M.D., F.A.C.S. Gastrointestinal and Minimally Invasive Surgery [facility] [address], [place],  [address] [phone] Main / Paging  05/26/2017 7:14 AM    The patient's history has been reviewed, patient examined, no change in status, stable for surgery.  I have reviewed the patient's chart and labs.  Questions were answered to the patient's satisfaction.     Adin Hector

## 2017-05-26 NOTE — Progress Notes (Signed)
Dr Ola Spurr aware of low UOP. Order received to hang ALbumin 5% 250 x 2.

## 2017-05-26 NOTE — Transfer of Care (Signed)
Immediate Anesthesia Transfer of Care Note  Patient: Beth [name]  Procedure(s) Performed: XI ROBOTIC REDUTION AND REPAIR OF INCARCERATED ANTERIOR DIAPHRAGMATIC HERNIA OF MORGAGNI WITH MESH (N/A Abdomen)  Patient Location: PACU  Anesthesia Type:General  Level of Consciousness: awake, alert , oriented and patient cooperative  Airway & Oxygen Therapy: Patient Spontanous Breathing and Patient connected to face mask  Post-op Assessment: Report given to RN and Post -op Vital signs reviewed and stable  Post vital signs: Reviewed and stable  Last Vitals:  Vitals:   05/26/17 0543  BP: (!) 135/95  Pulse: (!) 105  Resp: 16  Temp: 36.8 C  SpO2: 95%    Last Pain:  Vitals:   05/26/17 0543  TempSrc: Oral         Complications: No apparent anesthesia complications

## 2017-05-26 NOTE — Anesthesia Postprocedure Evaluation (Signed)
Anesthesia Post Note  Patient: Ellyce Lafevers  Procedure(s) Performed: XI ROBOTIC REDUTION AND REPAIR OF INCARCERATED ANTERIOR DIAPHRAGMATIC HERNIA OF MORGAGNI WITH MESH (N/A Abdomen)     Patient location during evaluation: PACU Anesthesia Type: General Level of consciousness: awake and alert Pain management: pain level controlled Vital Signs Assessment: post-procedure vital signs reviewed and stable Respiratory status: spontaneous breathing, nonlabored ventilation, respiratory function stable and patient connected to nasal cannula oxygen Cardiovascular status: blood pressure returned to baseline and stable Postop Assessment: no apparent nausea or vomiting Anesthetic complications: no    Last Vitals:  Vitals:   05/26/17 1615 05/26/17 1630  BP: (!) 134/94 (!) 117/97  Pulse: (!) 101 100  Resp: (!) 9 15  Temp:  36.7 C  SpO2: 99% 100%    Last Pain:  Vitals:   05/26/17 1630  TempSrc:   PainSc: 4                  Rice Walsh,W. EDMOND

## 2017-05-26 NOTE — Anesthesia Procedure Notes (Addendum)
Arterial Line Insertion Start/End3/09/2017 7:25 AM, 05/26/2017 7:31 AM Performed by: Suzette Battiest, MD, anesthesiologist  Patient location: Pre-op. Preanesthetic checklist: patient identified, IV checked, site marked, risks and benefits discussed, surgical consent, monitors and equipment checked, pre-op evaluation, timeout performed and anesthesia consent Lidocaine 1% used for infiltration and patient sedated Left, radial was placed Catheter size: 20 G Hand hygiene performed , maximum sterile barriers used  and Seldinger technique used  Attempts: 1 Procedure performed using ultrasound guided technique. Ultrasound Notes:anatomy identified, needle tip was noted to be adjacent to the nerve/plexus identified, no ultrasound evidence of intravascular and/or intraneural injection and image(s) printed for medical record Following insertion, dressing applied and Biopatch. Post procedure assessment: normal  Patient tolerated the procedure well with no immediate complications.

## 2017-05-26 NOTE — Op Note (Addendum)
05/26/2017  11:46 AM  PATIENT:  Beth [name]  57 y.o. female  Patient Care Team: [name], MD as PCP - Huston Foley, MD as Consulting Physician (General Surgery)  PRE-OPERATIVE DIAGNOSIS:  Anterior diaphragmatic hernia of Morgagni incarcerated with transverse colon  POST-OPERATIVE DIAGNOSIS:  Anterior diaphragmatic hernia of Morgagni incarcerated with transverse colon   PROCEDURE:  : XI ROBOTIC REDUCTION AND REPAIR OF INCARCERATED ANTERIOR DIAPHRAGMATIC HERNIA OF MORGAGNI WITH MESH  SURGEON:  Adin Hector, MD  ASSIST:  Leighton Ruff, MD, FACS. An experienced assistant was required given the standard of surgical care given the complexity of the case.  This assistant was needed for exposure, dissection, suctioning, retraction, instrument exchange, etc.   ANESTHESIA:   local and general  EBL:  Total I/O In: 1850 [I.V.:1600; IV Piggyback:250] Out: 110 [Urine:100; Blood:10]  Delay start of Pharmacological VTE agent (>24hrs) due to surgical blood loss or risk of bleeding:  no  ANESTHESIA: 1. General anesthesia. 2. Local anesthetic in a field block around all port sites.  SPECIMEN:  Mediastinal hernia sac (not sent).  DRAINS:  A 19-French Blake drain goes from the right upper quadrant along the lesser curvature of the stomach into the mediastinum.  COUNTS:  YES  PLAN OF CARE: Admit to inpatient   PATIENT DISPOSITION:  PACU - hemodynamically stable.  INDICATION:   Patient with symptomatic hernia in Anterior diaphragm.  Morgagni defect.  Transverse colon incarcerated within it.  Replacing most of her right chest cavity, significantly compressing her right lung.  History of right lung cancer status post chemoradiation therapy with several years disease free.  Persistent and worsening dyspnea with enlarging hernia sac nearly completely complexing the right lung.  The patient has had extensive work-up & we feel the patient will benefit from repair:  The  anatomy & physiology of the foregut and anti-reflux mechanism was discussed.  The pathophysiology of diaphragmatic hernia &  natural history risks without surgery was discussed.   The patient's symptoms are not adequately controlled by medicines and other non-operative treatments.  I feel the risks of no intervention will lead to serious problems that outweigh the operative risks; therefore, I recommended surgery to reduce the colon out of the chest and closure and mesh reinforcement to close the hernia defect in the anterior diaphragm down.  Need for a thorough workup to rule out the differential diagnosis and plan treatment was explained.  I explained laparoscopic techniques with possible need for an open approach.  Risks such as bleeding, infection, abscess, leak, need for further treatment, heart attack, death, and other risks were discussed.   I noted a good likelihood this will help address the problem.  Goals of post-operative recovery were discussed as well.  Possibility that this will not correct all symptoms was explained.  Post-operative dysphagia, need for short-term liquid & pureed diet, inability to vomit, possibility of reherniation, possible need for medicines to help control symptoms in addition to surgery were discussed.  We will work to minimize complications.   Educational handouts further explaining the pathology, treatment options, and dysphagia diet was given as well.  Questions were answered.  The patient expresses understanding & wishes to proceed with surgery.  OR FINDINGS:   12 x 6 cm anterior diaphragmatic hernia defect consistent with Morgagni hernia.  Quite large.  Most of transverse colon and greater omentum incarcerated in the right chest cavity, compressing right lung.  No evidence of any nodularity suspicious for cancer.    It is  a primary repair of anterior diaphragm back to costal ridge/xiphoid regiod.  A 25 x 20 cm mesh reinforcement laid transversely.  Bard Ventralight  dual sided mesh.  Inferior edge secured to anterior wall fascia with interrupted Prolene sutures.  Anterior & lateral edges of mesh secured to diaphragm, sewn with #1 Prolene suture around the edge.  #1 PDS suture sewn in the center of the mesh.  A 19 [place] Blake drain goes in the right anterior intercostal space and overlays the right diaphragm and collapsed chest cavity.  R lung expanded by PACU CXR.  ?Bone mets on CXR - note sent to oncologist, Dr Earlie Server.  DESCRIPTION:   Informed consent was confirmed.  The patient received IV antibiotics prior to incision.  The underwent general anesthesia without difficulty.  A Foley catheter sterilely placed.  The patient was positioned supine with arms tucked. The abdomen was prepped and draped in the sterile fashion.  Surgical time-out confirmed our plan.  I induce insufflation through a Varess needle in the left subcostal region.  I placed a robotic port in the left upper quadrant along the anterior axillary line.  Camera inspection revealed entry was clean & no injury.  Under direct visualization, we placed extra robotic and laparoscopic ports.  XI robot docked.  Could see a large anterior diaphragmatic hernia defect with transverse colon and greater omentum going up into it.  It was not reducible.  Therefore proceeded with dissecting out the hernia sac to reduce the hernia sac and its contents.  Started with transection of the falciform ligament near the umbilicus.  I got to the preperitoneal plane epigastric region and use that to find the anterior boundary of the hernia defect underneath the xiphoid.  Went up into the mediastinum and towards the right chest cavity.  Bluntly freed off adhesions of the hernia sac to the pleura and rib cage.  Reduced out the hernia sac.  I came around the right and left lateral diaphragmatic defects of the hernia.  Transected the hernia sac there.  Gradually with this I was able to release the suction cup effect of the hernia  sac and reduce a large swath of greater omentum and most of the transverse colon out.  That allowed dissection to continue more easily.  Freddrick March off more hernia sac and transected that.  Came around posteriorly and transected hernia sac off the posterior diaphragmatic hernia defect.  With that I could reduce the greater omentum transverse colon and all intra-abdominal contents out of the right chest wall cavity & mediastinum.  I dissected off and removed the hernia sac that had been up in the mediastinum and right chest cavity.  This was later removed and sent for pathology.  She had a right lung cancer there, but there was no evidence of any nodularity or metastatic disease in her chest cavity.  We inspected the colon.  It was viable & uninjured.  I mobilized the left lateral sector of the liver off the anterior diaphragm to help have a little more mobility on the diaphragm to reach up to the chest wall.  Confirmed that the esophageal hiatus was snug and there was no esophageal hiatal hernia.  No other diaphragmatic defects noted.  Dr. Marcello Moores placed a 76 French Blake drain through the right anterior chest wall over the between the lower ribs along the midclavicular line under direct visualization into the right chest cavity..  We passed the 11 French Blake drain & overlaid the entire right chest wall cavity.  Secured at the skin with Prolene suture.  We then proceeded to primarily close the hernia defect.  #1 Prolene interrupted sutures were passed from the anterior subxiphoid region through the anterior abdominal wall fascia and in and out the diaphragm and back out the fascia.  This was tied down.  We decreased abdominal insufflation pressure to allow the diaphragm to reapproximate to the subxiphoid region.  6 interrupted sutures.  Some tension but good bites.  Given the large hernia defect & her morbid obesity, I felt she would benefit from mesh reinforcement.  Chose 25 x 20 cm Bard mesh.   Ventralight/nonadherent barrier.  Felt permanent mesh would be better given her obesity.  Placed a 12 mm port in subxiphoid region.  Passed sutures and the mesh through this.  Unrolled the mesh and laid it transversely over the diaphragm such that the closure was centered.  The inferior aspect came through the anterior abdominal wall.  This was secured with #1 Prolene interrupted sutures x3.  I then took 1 Prolene suture and sutured the edges of the mesh to the diaphragm starting in the right lateral aspect, coming around anteriorly just anterior to the esophageal hiatus which had no hernia.  And came around the left side.  This had circumferential securing of the mesh to the diaphragm.  I then did a #1 PDS suture to close the 12 mm port site fascial defect in and out the mesh as well to centrally secure the mesh.  This provided at least 7 cm a circumferential coverage of the repair  Did inspection.  Hemostasis is good.  Colon, greater omentum and other organs show no injury or other abnormality..  CO2 was evacuated.  Ports removed.  Robotic port sites 8 mm and 5 mm lap port site closed with Monocryl suture.  Blake drain to suction & Anesthesia Valsalva'd to re-expand the right lung.  Sterile dressing applied.  Patient extubated in recovery room in stable condition.  I discussed postop care in detail with the patient in the office.  Discussed again with the patient in the holding area.  No one is in the hospital but per the patient's request, I called and discussed with the patient's son.  Questions answered.  He expressed understanding and appreciation.  He plans to see her this afternoon  Adin Hector, M.D., F.A.C.S. Gastrointestinal and Minimally Invasive Surgery [facility] [address] [place], [place] [address] [phone] Main / Paging

## 2017-05-27 ENCOUNTER — Other Ambulatory Visit: Payer: Self-pay

## 2017-05-27 ENCOUNTER — Inpatient Hospital Stay (HOSPITAL_COMMUNITY): Payer: Medicare Other

## 2017-05-27 ENCOUNTER — Telehealth: Payer: Self-pay | Admitting: Medical Oncology

## 2017-05-27 ENCOUNTER — Encounter (HOSPITAL_COMMUNITY): Payer: Self-pay | Admitting: Surgery

## 2017-05-27 DIAGNOSIS — N182 Chronic kidney disease, stage 2 (mild): Secondary | ICD-10-CM

## 2017-05-27 LAB — BASIC METABOLIC PANEL
Anion gap: 11 (ref 5–15)
BUN: 21 mg/dL — AB (ref 6–20)
CHLORIDE: 103 mmol/L (ref 101–111)
CO2: 24 mmol/L (ref 22–32)
CREATININE: 2.06 mg/dL — AB (ref 0.44–1.00)
Calcium: 7.4 mg/dL — ABNORMAL LOW (ref 8.9–10.3)
GFR calc Af Amer: 30 mL/min — ABNORMAL LOW (ref >60)
GFR calc non Af Amer: 26 mL/min — ABNORMAL LOW (ref >60)
GLUCOSE: 124 mg/dL — AB (ref 65–99)
POTASSIUM: 5.1 mmol/L (ref 3.5–5.1)
SODIUM: 138 mmol/L (ref 135–145)

## 2017-05-27 LAB — URINALYSIS, ROUTINE W REFLEX MICROSCOPIC
Bilirubin Urine: NEGATIVE
Glucose, UA: NEGATIVE mg/dL
Ketones, ur: NEGATIVE mg/dL
Nitrite: NEGATIVE
Protein, ur: 100 mg/dL — AB
Specific Gravity, Urine: 1.019 (ref 1.005–1.030)
pH: 5 (ref 5.0–8.0)

## 2017-05-27 LAB — CBC
HEMATOCRIT: 29.3 % — AB (ref 36.0–46.0)
HEMOGLOBIN: 9 g/dL — AB (ref 12.0–15.0)
MCH: 29.8 pg (ref 26.0–34.0)
MCHC: 30.7 g/dL (ref 30.0–36.0)
MCV: 97 fL (ref 78.0–100.0)
Platelets: 220 10*3/uL (ref 150–400)
RBC: 3.02 MIL/uL — ABNORMAL LOW (ref 3.87–5.11)
RDW: 17.6 % — AB (ref 11.5–15.5)
WBC: 12.5 10*3/uL — ABNORMAL HIGH (ref 4.0–10.5)

## 2017-05-27 LAB — PROTEIN, URINE, RANDOM: Total Protein, Urine: 129 mg/dL

## 2017-05-27 LAB — CREATININE, URINE, RANDOM: Creatinine, Urine: 206.74 mg/dL

## 2017-05-27 LAB — MAGNESIUM: MAGNESIUM: 1.4 mg/dL — AB (ref 1.7–2.4)

## 2017-05-27 LAB — SODIUM, URINE, RANDOM: Sodium, Ur: 21 mmol/L

## 2017-05-27 MED ORDER — LACTATED RINGERS IV BOLUS (SEPSIS)
1000.0000 mL | Freq: Once | INTRAVENOUS | Status: AC
  Administered 2017-05-27: 1000 mL via INTRAVENOUS

## 2017-05-27 MED ORDER — OXYCODONE HCL 10 MG PO TABS
10.0000 mg | ORAL_TABLET | Freq: Four times a day (QID) | ORAL | Status: DC | PRN
Start: 2017-05-27 — End: 2017-05-27

## 2017-05-27 MED ORDER — MAGNESIUM SULFATE 4 GM/100ML IV SOLN
4.0000 g | Freq: Once | INTRAVENOUS | Status: AC
Start: 2017-05-27 — End: 2017-05-27
  Administered 2017-05-27: 4 g via INTRAVENOUS
  Filled 2017-05-27: qty 100

## 2017-05-27 MED ORDER — IPRATROPIUM-ALBUTEROL 20-100 MCG/ACT IN AERS: 1.0000 | INHALATION_SPRAY | Freq: Four times a day (QID) | RESPIRATORY_TRACT | Status: DC | PRN

## 2017-05-27 MED ORDER — PSYLLIUM 95 % PO PACK
1.0000 | PACK | Freq: Two times a day (BID) | ORAL | Status: DC
  Administered 2017-05-27: 1 via ORAL
  Filled 2017-05-27 (×7): qty 1

## 2017-05-27 MED ORDER — ALBUMIN HUMAN 5 % IV SOLN
25.0000 g | Freq: Once | INTRAVENOUS | Status: AC
  Administered 2017-05-27: 25 g via INTRAVENOUS
  Filled 2017-05-27: qty 500

## 2017-05-27 MED ORDER — VENLAFAXINE HCL ER 75 MG PO CP24
75.0000 mg | ORAL_CAPSULE | Freq: Every day | ORAL | Status: DC
  Administered 2017-05-27 – 2017-06-01 (×6): 75 mg via ORAL
  Filled 2017-05-27 (×6): qty 1

## 2017-05-27 MED ORDER — AMLODIPINE BESYLATE 5 MG PO TABS
5.0000 mg | ORAL_TABLET | Freq: Every day | ORAL | Status: DC
  Administered 2017-05-27 – 2017-05-28 (×2): 5 mg via ORAL
  Filled 2017-05-27 (×2): qty 1

## 2017-05-27 MED ORDER — HYDROXYZINE HCL 10 MG PO TABS: 10.0000 mg | ORAL_TABLET | Freq: Three times a day (TID) | ORAL | Status: DC | PRN

## 2017-05-27 MED ORDER — IPRATROPIUM-ALBUTEROL 0.5-2.5 (3) MG/3ML IN SOLN
3.0000 mL | Freq: Four times a day (QID) | RESPIRATORY_TRACT | Status: DC | PRN
  Administered 2017-05-28 – 2017-05-29 (×3): 3 mL via RESPIRATORY_TRACT
  Filled 2017-05-27 (×3): qty 3

## 2017-05-27 MED ORDER — ENSURE SURGERY PO LIQD
237.0000 mL | Freq: Two times a day (BID) | ORAL | Status: DC
  Administered 2017-05-27 – 2017-05-31 (×4): 237 mL via ORAL
  Filled 2017-05-27 (×12): qty 237

## 2017-05-27 MED ORDER — FLUTICASONE PROPIONATE 50 MCG/ACT NA SUSP
2.0000 | Freq: Every day | NASAL | Status: DC
  Administered 2017-05-27: 2 via NASAL
  Filled 2017-05-27 (×2): qty 16

## 2017-05-27 MED ORDER — GABAPENTIN 300 MG PO CAPS
300.0000 mg | ORAL_CAPSULE | Freq: Three times a day (TID) | ORAL | Status: DC
  Administered 2017-05-27 – 2017-05-28 (×3): 300 mg via ORAL
  Filled 2017-05-27 (×3): qty 1

## 2017-05-27 NOTE — Progress Notes (Signed)
Admission to the ICU pt received 1L fluid with only 64ml of UOP over two hours. Pt received a second ordered scheduled 1L fluid bolus with a UOP of 50 over two hours. A standing PRN order of a 1L LR fluid bolus for a UOP < 120 over 4 hours. Pt received the PRN standing order bolus with only 62ml UOP after bolus. Dr. Johney Maine ordered a 4th fluid bolus at 0330. By 4944 no urine in foley bag. Bladder scanned x2 over two hours with no urine recorded. Foley flushed and working adequately. Dr. Johney Maine called and told about pt's UOP, 4 boluses given and creatinine and BUN. Dr. Johney Maine stated "to give another fluid bolus and that I am aware of her UOP".

## 2017-05-27 NOTE — Addendum Note (Signed)
Addendum  created 05/27/17 1324 by Suzette Battiest, MD   Intraprocedure Blocks edited, Sign clinical note

## 2017-05-27 NOTE — Telephone Encounter (Signed)
Had surgery and requests Mohamed to stop by . Julien Nordmann out of office today.

## 2017-05-27 NOTE — Progress Notes (Addendum)
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  Most likely dehydration versus pulmonary failure  Plan:  -Okay to transfer to floor.  -Hydration for now.  BUN/creatinine ratio argues against more like ATN not dehydration but would like medical consultation to further evaluate.  PAged x 2.  In the past shey has had elevated BNP but a normal echocardiogram so not a straightforward picture.  Normally people benefit from diuresis after diaphragm hernia repair and thoracic surgery, but hesitant to do that with elevated creatinine unless medicine feels more strongly that this is more fluid overload and not dehydration.  Holding any potentially nephrotoxic agents.  Did not  receive any perioperative NSAIDs.  EBL was minimal.  We had lowered intra-abdominal insufflation during the surgery for a fair part of the time to avoid poor venous return.  Suspect she has some baseline chronic kidney disease.  Improve pain control.  Mobilize.  Continue surgical drain in right chest.  Most likely can come out in a week in the office.  Advance diet as tolerated.  Concern for bone mets on CXR - left note for Dr Earlie Server to see/evaluate  -VTE prophylaxis- SCDs, etc -mobilize as tolerated to help recovery  40 minutes spent in review, evaluation, examination, counseling, and coordination of care.  More than 50% of that time was spent in counseling.  Adin Hector, M.D., F.A.C.S. Gastrointestinal and Minimally Invasive Surgery [facility] [address], [place], [place] [address] [phone] Main / Paging   05/27/2017    Subjective: (Chief complaint)  'HEY DR. Johney Maine! HOW YA DOIN?" Being a little short of breath but pain controlled.  Poor urine output intraop & through the night.  Received numerous IV fluid boluses.  +6L overall.  Stepdown nurses very involved and vigilant  Tolerating liquids.  No nausea or vomiting.  Felt a little bloated but now better   Objective:  Vital signs:  Vitals:   05/27/17 0400 05/27/17 0500 05/27/17 0600 05/27/17 0700  BP: (!) 166/92 (!) 159/125    Pulse: 91 89 98 (!) 110  Resp: 13 14 (!) 41 (!) 25  Temp:      TempSrc:      SpO2: 96% 100%  98%  Weight:      Height:  Intake/Output   Yesterday:  03/07 0701 - 03/08 0700 In: 2716.7 [I.V.:1600; IV Piggyback:1116.7] Out: 602 [Urine:252; Drains:340; Blood:10] This shift:  No intake/output data recorded.  Bowel function:  Flatus: YES  BM:  No  Drain: Serosanguinous   Physical Exam:  General: Pt awake/alert/oriented x4 in no acute distress.  Smiling.  Waiting.  Chatty but with some mild conversational dyspnea Eyes:  PERRL, normal EOM.  Sclera clear.  No icterus Neuro: CN II-XII intact w/o focal sensory/motor deficits. Lymph: No head/neck/groin lymphadenopathy Psych:  No delerium/psychosis/paranoia.  Not agitated.  Not anxious. HENT: Normocephalic, Mucus membranes moist.  No thrush Neck: Supple, No tracheal deviation Chest: No rhonchi.  No major crackles.  Mild chest soreness with deep breaths anteriorly at stitches.  CV:  Pulses intact.  Regular rhythm MS: Normal AROM mjr joints.  No obvious deformity  Abdomen: Soft.  Nondistended.  Mildly tender at incisions only.  No evidence of peritonitis.  No incarcerated hernias.  Ext:  No deformity.  No mjr edema.  No cyanosis Skin: No petechiae / purpura  Results:   Labs: Results for orders placed or performed during the hospital encounter of 05/26/17 (from the past 48 hour(s))  MRSA PCR Screening     Status: None   Collection Time: 05/26/17  8:04 PM  Result Value Ref Range   MRSA by PCR NEGATIVE NEGATIVE    Comment:        The GeneXpert MRSA Assay (FDA approved for NASAL specimens only), is one component of a comprehensive MRSA colonization surveillance program. It is not intended to diagnose MRSA infection nor to guide or monitor treatment for MRSA infections. Performed at [facility], [place] [address], [place], [place] [address]   Basic metabolic panel     Status: Abnormal   Collection Time: 05/27/17  3:40 AM  Result Value Ref Range   Sodium 138 135 - 145 mmol/L   Potassium 5.1 3.5 - 5.1 mmol/L   Chloride 103 101 - 111 mmol/L   CO2 24 22 - 32 mmol/L   Glucose, Bld 124 (H) 65 - 99 mg/dL   BUN 21 (H) 6 - 20 mg/dL   Creatinine, Ser 2.06 (H) 0.44 - 1.00 mg/dL   Calcium 7.4 (L) 8.9 - 10.3 mg/dL   GFR calc non Af Amer 26 (L) >60 mL/min   GFR calc Af Amer 30 (L) >60 mL/min    Comment: (NOTE) The eGFR has been calculated using the CKD EPI equation. This calculation has not been validated in all clinical situations. eGFR's  persistently <60 mL/min signify possible Chronic Kidney Disease.    Anion gap 11 5 - 15    Comment: Performed at [facility], [place] [address] [place], [place] [address]  Magnesium     Status: Abnormal   Collection Time: 05/27/17  3:40 AM  Result Value Ref Range   Magnesium 1.4 (L) 1.7 - 2.4 mg/dL    Comment: Performed at [facility], [place] [address], [place], [place] [address]  CBC     Status: Abnormal   Collection Time: 05/27/17  3:40 AM  Result Value Ref Range   WBC 12.5 (H) 4.0 - 10.5 K/uL   RBC 3.02 (L) 3.87 - 5.11 MIL/uL   Hemoglobin 9.0 (L) 12.0 - 15.0 g/dL   HCT 29.3 (L) 36.0 - 46.0 %   MCV 97.0 78.0 - 100.0 fL   MCH 29.8 26.0 - 34.0 pg   MCHC 30.7 30.0 - 36.0 g/dL  RDW 17.6 (H) 11.5 - 15.5 %   Platelets 220 150 - 400 K/uL    Comment: Performed at [facility], [place] [address] [place], [place] [address]   *Note: Due to a large number of results and/or encounters for the requested time period, some results have not been displayed. A complete set of results can be found in Results Review.    Imaging / Studies: Dg Chest Port 1 View  Result Date: 05/26/2017 CLINICAL DATA:  PACU follow up diaphragmatic hernia repair - hx right lung cancer - 2nd image obtained to try to correct rotation EXAM: PORTABLE CHEST 1 VIEW COMPARISON:  02/17/2017 CT FINDINGS: Interval repair of RIGHT diaphragmatic hernia. Patient is slightly rotated towards the RIGHT. The heart size is enlarged. There is pleural thickening or pleural effusion on the RIGHT. There mildly prominent interstitial markings and early airspace filling opacities suggesting pulmonary edema or consolidation, RIGHT greater than LEFT. There are sclerotic osseous lesions consistent with metastatic disease. IMPRESSION: Bilateral airspace filling opacity, RIGHT greater than LEFT favoring pulmonary edema. Pleural thickening or pleural effusion on the RIGHT. Osseous metastatic disease.  Electronically Signed   By: Nolon Nations M.D.   On: 05/26/2017 12:48    Medications / Allergies: per chart  Antibiotics: Anti-infectives (From admission, onward)   Start     Dose/Rate Route Frequency Ordered Stop   05/26/17 2100  cefoTEtan in Dextrose 5% (CEFOTAN) IVPB 2 g     2 g Intravenous Every 12 hours 05/26/17 1208 05/26/17 2133   05/26/17 0538  cefTRIAXone (ROCEPHIN) 2 g in sodium chloride 0.9 % 100 mL IVPB     2 g 200 mL/hr over 30 Minutes Intravenous On call to O.R. 05/26/17 6244 05/26/17 0805   05/26/17 0538  metroNIDAZOLE (FLAGYL) IVPB 500 mg     500 mg 100 mL/hr over 60 Minutes Intravenous On call to O.R. 05/26/17 6950 05/26/17 0827        Note: Portions of this report may have been transcribed using voice recognition software. Every effort was made to ensure accuracy; however, inadvertent computerized transcription errors may be present.   Any transcriptional errors that result from this process are unintentional.     Adin Hector, M.D., F.A.C.S. Gastrointestinal and Minimally Invasive Surgery [facility] [address], [place], [place] [address] [phone] Main / Paging   05/27/2017

## 2017-05-27 NOTE — Consult Note (Signed)
Medical Consultation   Beth [name]  PPJ:[id number]  DOB: 1960-06-23  DOA: 05/26/2017  PCP: [name], MD   Outpatient Specialists:    Requesting physician: Dr. Alwyn Pea  Reason for consultation: Acute kidney injury.   History of Present Illness: Beth [name] is an 57 y.o. female with past medical history is significant for morbid obesity, OSA, noncompliant with CPAP machine, hypertension since the age of 23, lung cancer, fibromyalgia amongst other medical problems.  Patient underwent robotic reduction and repair of incarcerated anterior diaphragmatic hernia of Morgagni with mesh.  Postoperatively, the patient was noted to have decreased urine output with elevated serum creatinine.  Patient's baseline serum creatinine is 0.87.  This morning, the serum creatinine has risen to 2.06.  Urine output is about 15 mils per hour.  Available vital signs were reviewed over the last 24 hours, and a blood pressure of 58/32 mmHg was noted around 9 PM last night.  I could not fully assess the patient's intraoperative vital signs monitoring.  Patient denied prior history of kidney disease.  No documented use of NSAIDs.  Despite adequate hydration with about 4 L of fluids as well as albumin, the patient continues to have decreased urine output.  Due to patient's body habitus, it can be challenges to accurately assess volume status.  However, patient does not look volume depleted.  No headache, no neck pain, no chest pain, no shortness of breath, no GI symptoms or urinary symptoms.  Patient also denied prior history of diabetes mellitus.  Review of Systems:  ROS As per HPI otherwise 10 point review of systems negative.    Past Medical History: Past Medical History:  Diagnosis Date  . Anemia   . Anxiety   . Aortic atherosclerosis ([place])   . Arthritis of knee, degenerative   . Asthma   . Complication of anesthesia    difficulty waking up, throat feels wierd  .  Depression   . Dermatitis   . Emphysema of lung ([place])   . Endocarditis    as teenager  . Fibromyalgia   . Flu-like symptoms 05/12/2016  . GERD (gastroesophageal reflux disease)   . Heart murmur   . History of MRSA infection   . History of radiation therapy 09/29/11-11/04/2011   right lung 2700cGy 15 sessions  . Hypertension   . Lung mass    R- ADENOCARCINOMA  . Morbid obesity ([place])   . Osseous metastasis ([place]) 09/20/11   per PET scan, Lung  . Pleural effusion 08/30/11  . Shortness of breath   . Sleep apnea    no longer using CPAP  . Tuberculosis    +PPD took treatments years ago    Past Surgical History: Past Surgical History:  Procedure Laterality Date  . THORACENTESIS  09/02/11, 09/09/11   right-side pleural effusion  . TUBAL LIGATION  2002     Allergies:   Allergies  Allergen Reactions  . Meloxicam     Possible GI bleed     Social History:  reports that she quit smoking about 6 years ago. Her smoking use included cigarettes. She has a 7.50 pack-year smoking history. she has never used smokeless tobacco. She reports that she drinks alcohol. She reports that she does not use drugs.   Family History: Family History  Problem Relation Age of Onset  . Alcohol abuse Father   . Hypertension Mother   . Hypertension Maternal Aunt   .  Hypertension Maternal Uncle   . Hypertension Maternal Grandmother   . Hypertension Maternal Grandfather   . Sarcoidosis Sister      Physical Exam: Vitals:   05/27/17 0500 05/27/17 0600 05/27/17 0700 05/27/17 0800  BP: (!) 159/125   (!) 133/102  Pulse: 89 98 (!) 110 (!) 110  Resp: 14 (!) 41 (!) 25 (!) 30  Temp:    98 F (36.7 C)  TempSrc:    Oral  SpO2: 100%  98% 96%  Weight:      Height:        Constitutional: Morbidly obese, alert and awake, oriented x3, not in any acute distress. Eyes: PERLA, EOMI, irises appear normal, anicteric sclera,  ENMT: external ears and nose appear normal,             Lips appears normal,  oropharynx mucosa, tongue, posterior pharynx appear normal  Neck: neck appears normal, no masses, normal ROM, no thyromegaly, no JVD  CVS: S1-S2 clear, no murmur rubs or gallops, no LE edema, normal pedal pulses  Respiratory: Decreased air entry on the right side of the lungs.  Good entry on the left.  .  Abdomen: soft nontender, nondistended, normal bowel sounds.  Organs are difficult to assess.   Musculoskeletal: : no cyanosis, clubbing or edema noted bilaterally Neuro: Cranial nerves II-XII intact, strength, sensation, reflexes Psych: judgement and insight appear normal, stable mood and affect, mental status Skin: no rashes or lesions or ulcers, no induration or nodules   Data reviewed:  I have personally reviewed following labs and imaging studies Labs:  CBC: Recent Labs  Lab 05/27/17 0340  WBC 12.5*  HGB 9.0*  HCT 29.3*  MCV 97.0  PLT 595    Basic Metabolic Panel: Recent Labs  Lab 05/27/17 0340  NA 138  K 5.1  CL 103  CO2 24  GLUCOSE 124*  BUN 21*  CREATININE 2.06*  CALCIUM 7.4*  MG 1.4*   GFR Estimated Creatinine Clearance: 41.1 mL/min (A) (by C-G formula based on SCr of 2.06 mg/dL (H)). Liver Function Tests: No results for input(s): AST, ALT, ALKPHOS, BILITOT, PROT, ALBUMIN in the last 168 hours. No results for input(s): LIPASE, AMYLASE in the last 168 hours. No results for input(s): AMMONIA in the last 168 hours. Coagulation profile No results for input(s): INR, PROTIME in the last 168 hours.  Cardiac Enzymes: No results for input(s): CKTOTAL, CKMB, CKMBINDEX, TROPONINI in the last 168 hours. BNP: Invalid input(s): POCBNP CBG: No results for input(s): GLUCAP in the last 168 hours. D-Dimer No results for input(s): DDIMER in the last 72 hours. Hgb A1c No results for input(s): HGBA1C in the last 72 hours. Lipid Profile No results for input(s): CHOL, HDL, LDLCALC, TRIG, CHOLHDL, LDLDIRECT in the last 72 hours. Thyroid function studies No results for  input(s): TSH, T4TOTAL, T3FREE, THYROIDAB in the last 72 hours.  Invalid input(s): FREET3 Anemia work up No results for input(s): VITAMINB12, FOLATE, FERRITIN, TIBC, IRON, RETICCTPCT in the last 72 hours. Urinalysis    Component Value Date/Time   COLORURINE YELLOW 10/08/2011 1654   APPEARANCEUR CLOUDY (A) 10/08/2011 1654   LABSPEC 1.024 10/08/2011 1654   PHURINE 5.0 10/08/2011 1654   GLUCOSEU NEGATIVE 10/08/2011 1654   HGBUR NEGATIVE 10/08/2011 1654   BILIRUBINUR negative 02/28/2016 1045   KETONESUR NEGATIVE 10/08/2011 1654   PROTEINUR positive 02/28/2016 1045   PROTEINUR 30 (A) 10/08/2011 1654   UROBILINOGEN 0.2 02/28/2016 1045   UROBILINOGEN 1.0 10/08/2011 1654   NITRITE negative 02/28/2016 1045  NITRITE NEGATIVE 10/08/2011 1654   LEUKOCYTESUR Negative 02/28/2016 1045     Sepsis Labs Invalid input(s): PROCALCITONIN,  WBC,  LACTICIDVEN Microbiology Recent Results (from the past 240 hour(s))  Surgical pcr screen     Status: None   Collection Time: 05/24/17  1:22 PM  Result Value Ref Range Status   MRSA, PCR NEGATIVE NEGATIVE Final   Staphylococcus aureus NEGATIVE NEGATIVE Final    Comment: (NOTE) The Xpert SA Assay (FDA approved for NASAL specimens in patients 18 years of age and older), is one component of a comprehensive surveillance program. It is not intended to diagnose infection nor to guide or monitor treatment. Performed at [facility], [place] [address], [place], [place] [address]   MRSA PCR Screening     Status: None   Collection Time: 05/26/17  8:04 PM  Result Value Ref Range Status   MRSA by PCR NEGATIVE NEGATIVE Final    Comment:        The GeneXpert MRSA Assay (FDA approved for NASAL specimens only), is one component of a comprehensive MRSA colonization surveillance program. It is not intended to diagnose MRSA infection nor to guide or monitor treatment for MRSA infections. Performed at [facility], [place]  [address], [place], [place] [address]        Inpatient Medications:   Scheduled Meds: . acetaminophen  1,000 mg Oral Q8H  . amLODipine  5 mg Oral Daily  . enoxaparin (LOVENOX) injection  40 mg Subcutaneous Q24H  . feeding supplement  237 mL Oral BID BM  . fluticasone  2 spray Each Nare Daily  . gabapentin  300 mg Oral TID  . lip balm  1 application Topical BID  . psyllium  1 packet Oral BID  . venlafaxine XR  75 mg Oral Q breakfast   Continuous Infusions: . sodium chloride    . chlorproMAZINE (THORAZINE) IV    . lactated ringers 1,000 mL (05/27/17 0535)  . magnesium sulfate 1 - 4 g bolus IVPB 4 g (05/27/17 0923)  . ondansetron [facility]) IV       Radiological Exams on Admission: Dg Chest Port 1 View  Result Date: 05/26/2017 CLINICAL DATA:  PACU follow up diaphragmatic hernia repair - hx right lung cancer - 2nd image obtained to try to correct rotation EXAM: PORTABLE CHEST 1 VIEW COMPARISON:  02/17/2017 CT FINDINGS: Interval repair of RIGHT diaphragmatic hernia. Patient is slightly rotated towards the RIGHT. The heart size is enlarged. There is pleural thickening or pleural effusion on the RIGHT. There mildly prominent interstitial markings and early airspace filling opacities suggesting pulmonary edema or consolidation, RIGHT greater than LEFT. There are sclerotic osseous lesions consistent with metastatic disease. IMPRESSION: Bilateral airspace filling opacity, RIGHT greater than LEFT favoring pulmonary edema. Pleural thickening or pleural effusion on the RIGHT. Osseous metastatic disease. Electronically Signed   By: Nolon Nations M.D.   On: 05/26/2017 12:48    Impression/Recommendation: In summary, patient is a 57 year old African-American female, morbidly obese, with past medical history significant for obstructive sleep apnea for which the patient has not been compliant with CPAP machine and hypertension since the age of 20.  The patient underwent robotic reduction and repair of  incarcerated anterior diaphragmatic hernia of Morgagni with mesh.  Patient does not look volume depleted.  From my review, has not visualized any use of nephrotoxic medication.  Episode of significant hypotension was noted.  Creatinine has risen from 0.87-2.06, with decreased urine output.  1.  Acute kidney  injury, likely postsurgical ATN: -We will continue workup of the patient's acute kidney injury. -We will check urinalysis, urine sodium, urine protein, urine creatinine and renal ultrasound. -We will continue cautious hydration. -We will keep map above 65 mmHg. -Avoid nephrotoxic medications. -Dose all medications assuming a GFR of less than 10 mils per minute. -Further management will depend on above.  2.  Hypertension: -Patient's serum creatinine was 0.87 2-weeks ago. -Continue to optimize. -Keep map greater than 65 mmHg.  3.  Morbid obesity/OSA/noncompliant with CPAP: -Patient has been advised to comply with CPAP use. -Diet and exercise. -Encouraged weight loss.  4.  History of lung cancer: -According to the patient, she is on [name]. -Continue current medication. -We will defer to the oncology team.  Thank you for this consultation.  Our [facility] hospitalist team will follow the patient with you.   Time Spent: 50 minutes.  Dana Allan, MD  [facility] Pager #: [phone] 7PM-7AM contact night coverage as above

## 2017-05-28 ENCOUNTER — Inpatient Hospital Stay (HOSPITAL_COMMUNITY): Payer: Medicare Other

## 2017-05-28 ENCOUNTER — Encounter (HOSPITAL_COMMUNITY): Payer: Self-pay | Admitting: Nephrology

## 2017-05-28 DIAGNOSIS — Q790 Congenital diaphragmatic hernia: Secondary | ICD-10-CM

## 2017-05-28 LAB — RENAL FUNCTION PANEL
Albumin: 4.1 g/dL (ref 3.5–5.0)
Anion gap: 11 (ref 5–15)
BUN: 31 mg/dL — ABNORMAL HIGH (ref 6–20)
CO2: 23 mmol/L (ref 22–32)
Calcium: 6.9 mg/dL — ABNORMAL LOW (ref 8.9–10.3)
Chloride: 103 mmol/L (ref 101–111)
Creatinine, Ser: 2.6 mg/dL — ABNORMAL HIGH (ref 0.44–1.00)
GFR calc Af Amer: 23 mL/min — ABNORMAL LOW (ref >60)
GFR calc non Af Amer: 19 mL/min — ABNORMAL LOW (ref >60)
Glucose, Bld: 138 mg/dL — ABNORMAL HIGH (ref 65–99)
Phosphorus: 5.3 mg/dL — ABNORMAL HIGH (ref 2.5–4.6)
Potassium: 5 mmol/L (ref 3.5–5.1)
Sodium: 137 mmol/L (ref 135–145)

## 2017-05-28 LAB — CBC
HEMATOCRIT: 27.2 % — AB (ref 36.0–46.0)
Hemoglobin: 8.3 g/dL — ABNORMAL LOW (ref 12.0–15.0)
MCH: 30.3 pg (ref 26.0–34.0)
MCHC: 30.5 g/dL (ref 30.0–36.0)
MCV: 99.3 fL (ref 78.0–100.0)
PLATELETS: 236 10*3/uL (ref 150–400)
RBC: 2.74 MIL/uL — ABNORMAL LOW (ref 3.87–5.11)
RDW: 18 % — AB (ref 11.5–15.5)
WBC: 17.6 10*3/uL — AB (ref 4.0–10.5)

## 2017-05-28 LAB — MAGNESIUM: Magnesium: 2.4 mg/dL (ref 1.7–2.4)

## 2017-05-28 LAB — BASIC METABOLIC PANEL
Anion gap: 11 (ref 5–15)
BUN: 31 mg/dL — AB (ref 6–20)
CHLORIDE: 103 mmol/L (ref 101–111)
CO2: 23 mmol/L (ref 22–32)
CREATININE: 2.64 mg/dL — AB (ref 0.44–1.00)
Calcium: 6.9 mg/dL — ABNORMAL LOW (ref 8.9–10.3)
GFR calc Af Amer: 22 mL/min — ABNORMAL LOW (ref >60)
GFR calc non Af Amer: 19 mL/min — ABNORMAL LOW (ref >60)
Glucose, Bld: 134 mg/dL — ABNORMAL HIGH (ref 65–99)
POTASSIUM: 5 mmol/L (ref 3.5–5.1)
SODIUM: 137 mmol/L (ref 135–145)

## 2017-05-28 MED ORDER — SODIUM CHLORIDE 0.9 % IV SOLN
1.0000 g | Freq: Every day | INTRAVENOUS | Status: DC
  Administered 2017-05-28 – 2017-05-30 (×3): 1 g via INTRAVENOUS
  Filled 2017-05-28 (×3): qty 1

## 2017-05-28 MED ORDER — FUROSEMIDE 10 MG/ML IJ SOLN
80.0000 mg | Freq: Three times a day (TID) | INTRAMUSCULAR | Status: AC
  Administered 2017-05-28 (×2): 80 mg via INTRAVENOUS
  Filled 2017-05-28 (×2): qty 8

## 2017-05-28 MED ORDER — AMLODIPINE BESYLATE 5 MG PO TABS
5.0000 mg | ORAL_TABLET | Freq: Two times a day (BID) | ORAL | Status: DC
  Administered 2017-05-28 – 2017-05-31 (×7): 5 mg via ORAL
  Filled 2017-05-28 (×8): qty 1

## 2017-05-28 NOTE — Consult Note (Signed)
Renal Service Consult Note [facility] [place] 05/28/2017 Sol [place]azing Requesting Physician:  Dr Johney Maine  Reason for Consult:  AKI HPI: The patient is a 57 y.o. year-old with hx hiatal hernia, HTN and lung cancer admitted on 3/7 for elective robotic repair of incarcerated diaphragmatic hernia of Morgagni.  This was done w/ MESH on 3/7 /19 by Dr Johney Maine.  Postop notes show drop in UOP post op, given IV albumin and bolus IVF's. Foley was flushed and drained adequately .  She is now 6L + over the last 2/5 days.  Creat 0.8 in Feb 2018.  Creat here 2.0 on 3/8 post-op, and up to 2.60 today this am.  Asked to see for AKI.    Pt is reporting some SOB at rest.  Has nasal O2 on.  Some leaking of urine around the foley.  Per staff UOP today 250 cc so far.  BP's postop were low briefly, but have been mostly normal to high now.  No contrast given, no acei/ ARB.  Did get 400 mg po dose of celebrex on morning of the surgery at 6 am.     Renal US > ^'d echo, 10 cm kidneys, no hydro UA 3/8 > hazy, mod LE, neg nit, rare bact, +tntc rbc/ 6-30 wbc UNa 21,  UCreat 207,  Urine total protein = 129 mg/dl   Inpatient meds received here > norvasc, defotetan, celecoxib, decadron, lovenox, ensure, neurontin, scopolamine, effexor-xr, IV rocephin, IV flagyl, fentanyl, iv dilaudid,  duoneb q 6, LR, robaxin and oxycodone.  NO IV contrast, no acei/ ARB.      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 Hypoxic in ED.  Dx was AMS due to dec'd renal clearance of pain medications.  MS Contin, MSIR and flexeril placed on hold.  Pt improved.  [name] placed on hold.   ROS  denies CP, + SOB  no joint pain    no HA  no blurry vision  no rash  no diarrhea  no nausea/ vomiting    Past Medical History  Past Medical History:  Diagnosis Date  . Anemia   . Anxiety   . Aortic atherosclerosis ([place])   . Arthritis of knee, degenerative   . Asthma   . Complication of anesthesia    difficulty waking up, throat feels wierd  . Depression   . Dermatitis   . Emphysema of lung ([place])   . Endocarditis    as teenager  . Fibromyalgia   . Flu-like symptoms 05/12/2016  . GERD (gastroesophageal reflux disease)   . Heart murmur   . History of MRSA infection   . History of radiation therapy 09/29/11-11/04/2011   right lung 2700cGy 15 sessions  . Hypertension   . Lung mass    R- ADENOCARCINOMA  . Morbid obesity ([place])   . Osseous metastasis ([place]) 09/20/11   per PET scan, Lung  . Pleural effusion 08/30/11  . Shortness of breath   . Sleep apnea    no longer using CPAP  . Tuberculosis    +PPD took treatments years ago   Past Surgical History  Past Surgical History:  Procedure Laterality Date  . THORACENTESIS  09/02/11, 09/09/11   right-side pleural effusion  . TUBAL LIGATION  2002  . XI ROBOTIC ASSISTED PARASTOMAL HERNIA REPAIR N/A 05/26/2017   Procedure: XI ROBOTIC REDUTION AND REPAIR OF INCARCERATED ANTERIOR DIAPHRAGMATIC HERNIA OF MORGAGNI WITH MESH;  Surgeon: Michael Boston, MD;  Location: [facility];  Service: General;  Laterality: N/A;   Family History  Family History  Problem Relation Age of Onset  . Alcohol abuse Father   . Hypertension Mother   . Hypertension Maternal Aunt   . Hypertension Maternal Uncle   . Hypertension Maternal Grandmother   . Hypertension Maternal Grandfather   . Sarcoidosis Sister    Social History  reports that she quit smoking about 6 years ago. Her smoking use included cigarettes. She has a 7.50 pack-year smoking history. she has never used smokeless tobacco. She reports that she drinks alcohol. She reports that she does not use drugs. Allergies  Allergies  Allergen  Reactions  . Meloxicam     Possible GI bleed   Home medications Prior to Admission medications   Medication Sig Start Date End Date Taking? Authorizing Provider  amLODipine (NORVASC) 5 MG tablet TAKE 1 TABLET BY MOUTH EVERY DAY 03/07/17  Yes Burchette, Alinda Sierras, MD  calcium carbonate (OSCAL) 1500 (600 Ca) MG TABS tablet Take 2 tablets by mouth daily.   Yes [provider]  clindamycin (CLEOCIN-T) 1 % external solution Apply topically 2 (two) times daily. Apply to face, chest , arms for rash Patient taking differently: Apply 1 application topically 3 (three) times daily. Apply to face, chest , arms for rash 04/18/17  Yes Curt Bears, MD  COMBIVENT RESPIMAT 20-100 MCG/ACT AERS respimat INHALE 1 PUFF INTO THE LUNGS EVERY 6 HOURS AS NEEDED FOR WHEEZING 05/03/17  Yes Burchette, Alinda Sierras, MD  diphenhydrAMINE (BENADRYL) 25 MG tablet Take 50 mg by mouth 2 (two) times daily.    Yes [provider]  diphenoxylate-atropine (LOMOTIL) 2.5-0.025 MG tablet TAKE 2 TABLETS BY MOUTH FOUR TIMES DAILY AS NEEDED FOR DIARRHEA OR LOOSE STOOL. ALTERNATE WITH IMMODIUM. MAX OF 8 TABLETS PER DAY 04/15/17  Yes Curcio, Roselie Awkward, NP  hydrochlorothiazide (HYDRODIURIL) 25 MG tablet TAKE 1 TABLET BY MOUTH EVERY DAY 03/16/17  Yes Burchette, Alinda Sierras, MD  hydrOXYzine (ATARAX/VISTARIL) 25 MG tablet TAKE 1 TABLET BY MOUTH EVERY 6 HOURS AS NEEDED FOR ITCHING 05/11/17  Yes Curt Bears, MD  loperamide (IMODIUM) 2 MG capsule Take 4 mg by mouth 4 (four) times daily as needed for diarrhea or loose stools.    Yes [provider]  Melatonin 5 MG TABS Take 25-30 mg by mouth at bedtime as needed (sleep).   Yes [provider]  Omega-3 Fatty Acids (FISH OIL) 1200 MG CAPS Take 3,600 mg by mouth daily.   Yes [provider]  Oxycodone HCl 10 MG TABS Take 1 tablet (10 mg total) by mouth every 6 (six) hours as needed (for pain). 05/18/17  Yes Curt Bears, MD  Probiotic Product (ALIGN PO) Take 1  capsule by mouth daily.   Yes [provider]  prochlorperazine (COMPAZINE) 10 MG tablet TAKE ONE TABLET BY MOUTH EVERY 6 HOURS AS NEEDED Patient taking differently: TAKE ONE TABLET BY MOUTH EVERY 6 HOURS AS NEEDED FOR NAUSEA 08/02/16  Yes Curt Bears, MD  [name] 150 MG tablet TAKE 1 TABLET BY MOUTH DAILY ON AN EMPTY STOMACH 1 HOUR BEFORE OR 2 HOURS AFTER A MEAL 05/25/17  Yes Curt Bears, MD  venlafaxine XR (EFFEXOR-XR) 75 MG 24 hr capsule TAKE 1 CAPSULE(75 MG) BY MOUTH 1 TIME 03/30/17  Yes Burchette, Alinda Sierras, MD  ergocalciferol (VITAMIN D2) 50000 units capsule Take 1 capsule (50,000 Units total) by mouth once a week. Patient not taking: Reported on 05/12/2017 10/17/15   [name], MD  furosemide (LASIX) 20 MG tablet TAKE 1 TABLET BY MOUTH DAILY AS NEEDED FOR SEVERE EDEMA 05/17/16   Burchette, Alinda Sierras, MD  hydrOXYzine (ATARAX/VISTARIL) 10 MG tablet TAKE 1 TABLET BY MOUTH THREE TIMES DAILY AS NEEDED FOR ITCHING Patient not taking: Reported on 05/12/2017 04/22/17   Copland, Gay Filler, MD  ketoconazole (NIZORAL) 2 % shampoo APPLY EXTERNALLY 2 TIMES A WEEK 05/24/17   Burchette, Alinda Sierras, MD  mirtazapine (REMERON) 30 MG tablet TAKE 1 TABLET(30 MG) BY MOUTH AT BEDTIME Patient not taking: Reported on 05/12/2017 10/29/16   [name], MD  mometasone (NASONEX) 50 MCG/ACT nasal spray Place 2 sprays into the nose daily. Patient not taking: Reported on 05/12/2017 05/28/16   [name], MD  NONFORMULARY OR COMPOUNDED ITEM Cbcd, cmp,ua   Dx hx fever, hx endocarditis, 02/28/16   Carollee Herter, Alferd Apa, DO  ondansetron (ZOFRAN) 4 MG tablet Take 1 tablet (4 mg total) by mouth every 8 (eight) hours as needed for nausea. 05/26/17   Michael Boston, MD  terbinafine (LAMISIL) 250 MG tablet Take 1 tablet (250 mg total) by mouth daily. Patient not taking: Reported on 05/12/2017 01/22/17   Copland, Gay Filler, MD  triamcinolone (KENALOG) 0.025 % cream APPLY EXTERNALLY TO THE AFFECTED AREA TWICE  DAILY Patient not taking: Reported on 05/12/2017 01/07/17   [name], MD   Liver Function Tests Recent Labs  Lab 05/28/17 0352  ALBUMIN 4.1   No results for input(s): LIPASE, AMYLASE in the last 168 hours. CBC Recent Labs  Lab 05/27/17 0340 05/28/17 0352  WBC 12.5* 17.6*  HGB 9.0* 8.3*  HCT 29.3* 27.2*  MCV 97.0 99.3  PLT 220 629   Basic Metabolic Panel Recent Labs  Lab 05/27/17 0340 05/28/17 0352  NA 138 137  137  K 5.1 5.0  5.0  CL 103 103  103  CO2 24 23  23   GLUCOSE 124* 138*  134*  BUN 21* 31*  31*  CREATININE 2.06* 2.60*  2.64*  CALCIUM 7.4* 6.9*  6.9*  PHOS  --  5.3*   Iron/TIBC/Ferritin/ %Sat No results found for: IRON, TIBC, FERRITIN, IRONPCTSAT  Vitals:   05/28/17 0400 05/28/17 0600 05/28/17 0800 05/28/17 1200  BP: (!) 116/97 131/90    Pulse: 91 91    Resp: 10 (!) 9    Temp: 98.5 F (36.9 C)  98.5 F (36.9 C) (!) 97.4 F (36.3 C)  TempSrc: Oral  Oral Oral  SpO2: 97% 99%    Weight:      Height:       Exam Gen adult AAF, nasal O2, appears a bit SOB, not in distress No rash, cyanosis or gangrene Sclera anicteric, throat clear  +JVD to angle of jaw Chest crackles R < L base, no wheezing RRR , tachy, 2/6 sem, no RG Abd soft ntnd no mass or ascites +bs obese, mult lap port scars, drain RUQ GU foley draining small amts yellow blood-tinged urine MS no joint effusions or deformity Ext 2+ R thigh edema, no LLE edema, lies on her R side, no wounds or ulcers Neuro is alert, Ox 3 , nf, +asterixis   Home meds: - norvasc  5/ HTCZ 25 mg/ lasix 20 qd prn - doxycycline 100 bid/ clinda topical / lamisil 250 qd - tarceva (erlotinib)150 qd - effexor xr 75/ oxy IR prn/ remeron 30 hs - combivent respimat 20 - 100 - prn's > atarax/ compazine/ imodium/ lomotil/ benadryl/ triamcinolone cream - vitamins/ calc carb/ probiotic  Inpatient meds received here > norvasc, defotetan, celecoxib, decadron, lovenox, ensure, neurontin, scopolamine,  effexor-xr, IV rocephin, IV flagyl, fentanyl, iv dilaudid,  duoneb q 6, LR, robaxin and oxycodone.  NO IV contrast, no acei/ ARB.   CXR 3/7, 3/8 > chronic distortion of R chest d/t hernia, L chest appears clear  Renal US > ^'d echo, 10 cm kidneys, no hydro UA 3/8 > hazy, mod LE, neg nit, rare bact, +tntc rbc/ 6-30 wbc UNa 21,  UCreat 207,  Urine total protein = 129 mg/dl   Impression: 1  Acute renal failure - oliguric, AKI prob due to hypoperfusion/ COX II inhibitor postop.  Making some urine today, lytes ok.  Creat rising. Suspect vol is up, pt SOB, will dc IVF and give IV lasix 1-2 doses. Leaking around foley, check bladder scan. Renal US unremarkable.  Will follow.  2  Asterixis - will dc neurontin for now, also dc'd prn's which might cause AMS in AKI 3  SP hernia repair - POD #2 (surg on 05/26/17) 4  HTN - will ^norvasc 5 bid for now, prn hydral IV 5  Lung Ca - holding tarceva per primary 6  Depression 7  Obesity/ OSA   Plan - as above  Kelly Splinter MD [employer] pager [phone]   05/28/2017, 2:38 PM

## 2017-05-28 NOTE — Progress Notes (Signed)
2 Days Post-Op   Subjective/Chief Complaint: Complains of difficulty swallowing   Objective: Vital signs in last 24 hours: Temp:  [97.5 F (36.4 C)-98.5 F (36.9 C)] 98.5 F (36.9 C) (03/09 0400) Pulse Rate:  [89-110] 91 (03/09 0600) Resp:  [8-30] 9 (03/09 0600) BP: (99-153)/(65-102) 131/90 (03/09 0600) SpO2:  [87 %-100 %] 99 % (03/09 0600)    Intake/Output from previous day: 03/08 0701 - 03/09 0700 In: 3460 [P.O.:960; I.V.:2400; IV Piggyback:100] Out: 595 [Urine:415; Drains:180] Intake/Output this shift: No intake/output data recorded.  General appearance: alert and cooperative Resp: clear to auscultation bilaterally Cardio: regular rate and rhythm GI: soft, moderate tenderness. quiet. incisions look good  Lab Results:  Recent Labs    05/27/17 0340 05/28/17 0352  WBC 12.5* 17.6*  HGB 9.0* 8.3*  HCT 29.3* 27.2*  PLT 220 236   BMET Recent Labs    05/27/17 0340 05/28/17 0352  NA 138 137  137  K 5.1 5.0  5.0  CL 103 103  103  CO2 24 23  23   GLUCOSE 124* 138*  134*  BUN 21* 31*  31*  CREATININE 2.06* 2.60*  2.64*  CALCIUM 7.4* 6.9*  6.9*   PT/INR No results for input(s): LABPROT, INR in the last 72 hours. ABG No results for input(s): PHART, HCO3 in the last 72 hours.  Invalid input(s): PCO2, PO2  Studies/Results: Dg Chest 2 View  Result Date: 05/27/2017 CLINICAL DATA:  57 year old female with shortness of breath. History of small cell lung cancer. Postoperative day 1 status post anterior diaphragmatic Morgagni hernia repair. EXAM: CHEST - 2 VIEW COMPARISON:  05/26/2017 and earlier. FINDINGS: AP and lateral semi upright views at 1513 hours. There is an anterior approach right side pleural or diaphragmatic drain in place coursing from anterior to posterior terminating at the expected location of the right posterior costophrenic angle. There is persistent subtotal opacification of the right hemithorax with evidence of loculated right lateral pleural  fluid (arrow). No pneumothorax identified. The tracheal air column and visible mediastinal contours are stable. The left lung ventilation is stable with no left pleural effusion or confluent opacity identified. Visible bowel gas pattern in the abdomen is normal. Diffusely sclerotic osseous structures redemonstrated. IMPRESSION: 1. Suboptimal right lung re-expansion following right diaphragm/Morgagni hernia repair. Pleural or postoperative drain in place. Stable right lung ventilation since yesterday with loculated right pleural effusion and patchy right lung opacity. 2. Stable, negative left lung. 3. Chronic diffuse sclerotic bone metastases. Electronically Signed   By: Genevie Ann M.D.   On: 05/27/2017 15:36   US Renal  Result Date: 05/27/2017 CLINICAL DATA:  Acute kidney injury EXAM: RENAL / URINARY TRACT ULTRASOUND COMPLETE COMPARISON:  CT 02/17/2017 FINDINGS: Right Kidney: Length: 9.8 cm. Echogenicity within normal limits. No mass or hydronephrosis visualized. Left Kidney: Length: 10.1 cm. Difficult visualization. Echogenicity within normal limits. No mass or hydronephrosis visualized. Bladder: Urinary bladder is empty. Incidentally noted is increased hepatic echogenicity. IMPRESSION: 1. Suboptimal visualization of the left kidney. Allowing for this, ultrasound appearance of the kidneys is within normal limits 2. Urinary bladder is empty 3. Increased echogenicity of the hepatic parenchyma suggesting steatosis. Electronically Signed   By: Donavan Foil M.D.   On: 05/27/2017 18:05   Dg Chest Port 1 View  Result Date: 05/26/2017 CLINICAL DATA:  PACU follow up diaphragmatic hernia repair - hx right lung cancer - 2nd image obtained to try to correct rotation EXAM: PORTABLE CHEST 1 VIEW COMPARISON:  02/17/2017 CT FINDINGS: Interval repair of  RIGHT diaphragmatic hernia. Patient is slightly rotated towards the RIGHT. The heart size is enlarged. There is pleural thickening or pleural effusion on the RIGHT. There mildly  prominent interstitial markings and early airspace filling opacities suggesting pulmonary edema or consolidation, RIGHT greater than LEFT. There are sclerotic osseous lesions consistent with metastatic disease. IMPRESSION: Bilateral airspace filling opacity, RIGHT greater than LEFT favoring pulmonary edema. Pleural thickening or pleural effusion on the RIGHT. Osseous metastatic disease. Electronically Signed   By: Nolon Nations M.D.   On: 05/26/2017 12:48    Anti-infectives: Anti-infectives (From admission, onward)   Start     Dose/Rate Route Frequency Ordered Stop   05/26/17 2100  cefoTEtan in Dextrose 5% (CEFOTAN) IVPB 2 g     2 g Intravenous Every 12 hours 05/26/17 1208 05/26/17 2133   05/26/17 0538  cefTRIAXone (ROCEPHIN) 2 g in sodium chloride 0.9 % 100 mL IVPB     2 g 200 mL/hr over 30 Minutes Intravenous On call to O.R. 05/26/17 3785 05/26/17 0805   05/26/17 0538  metroNIDAZOLE (FLAGYL) IVPB 500 mg     500 mg 100 mL/hr over 60 Minutes Intravenous On call to O.R. 05/26/17 0538 05/26/17 0827      Assessment/Plan: s/p Procedure(s): XI ROBOTIC REDUTION AND REPAIR OF INCARCERATED ANTERIOR DIAPHRAGMATIC HERNIA OF MORGAGNI WITH MESH (N/A) Go back to liquids and monitor  Cr still elevated but trending down. Continue IV hydration and hold nephrotoxic meds. Monitor ambulate  LOS: 2 days    TOTH III,PAUL S 05/28/2017

## 2017-05-28 NOTE — Progress Notes (Signed)
PROGRESS NOTE    Beth [name]  GHW:[id number] DOB: Feb 28, 1961 DOA: 05/26/2017 PCP: [name], MD   Brief Narrative: Patient is a 57 year old female with past medical history of morbid obesity, OSA, noncompliant with CPAP machine, hypertension, lung cancer, fibromyalgia who was admitted for robotic reduction and repair of incarcerated anterior diaphragmatic herni of Morgagni with mesh.  Patient was consulted for the management of acute kidney injury.  Postoperatively, patient was noted to have decreased urinary output with elevated serum creatinine.  Baseline creatinine is normal.   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  Acute kidney injury: Possible acute tubular necrosis likely precipitated by the episode of hypotension. US renal did not show any significant finding.  Patient's kidney function has deteriorated despite IV fluid hydration.  Elevated phosphorus.  She does not look volume depleted.  She has poor urinary output despite aggressive IV fluid resuscitation. Avoid nephrotoxic medications. Due to worsening kidney function, I have requested for nephrology evaluation today.  Hypertension: Continue current medication.  We will continue to monitor her blood pressure.  Morbid obesity/OSA: Noncompliant with CPAP.  History of lung cancer: On [name].  Follows with oncology as an outpatient.  Abnormal urinalysis/leukocytosis: Urinalysis looked abnormal.  Urine culture blood cultures have been sent.  Started on ceftriaxone   DVT prophylaxis:  SCD Code Status: Full Family Communication: None present at the bedside Disposition Plan: As per surgery after resolution of kidney dysfunction   Consultants: Medicine, nephrology  Procedures: Hernia repair  Antimicrobials: Ceftriaxone since 05/28/17  Subjective: Patient seen and examined the bedside this morning.  No overnight fever, nausea or vomiting.  She is not in any kind of acute distress.  Objective: Vitals:   05/28/17 0400 05/28/17 0600 05/28/17 0800 05/28/17 1200  BP: (!) 116/97 131/90    Pulse: 91 91    Resp: 10 (!) 9    Temp: 98.5 F (36.9 C)  98.5 F (36.9 C) (!) 97.4 F (36.3 C)  TempSrc: Oral  Oral Oral  SpO2: 97% 99%    Weight:      Height:        Intake/Output Summary (Last 24 hours) at 05/28/2017 1402 Last data filed at 05/28/2017 0600 Gross per 24 hour  Intake 2040 ml  Output 415 ml  Net 1625 ml   Filed Weights   05/26/17 0543  Weight: 127.9 kg (282 lb)    Examination:  General exam: Appears calm and comfortable ,Not in distress, obese HEENT:PERRL,Oral mucosa moist, Ear/Nose normal on gross exam Respiratory system: Bilateral equal air entry, normal vesicular breath sounds, no wheezes or crackles  Cardiovascular system: S1 & S2 heard, RRR. No JVD, systolic murmur present, rubs, gallops or clicks. No pedal edema. Gastrointestinal system: Abdomen is nondistended, soft and nontender. No organomegaly or masses felt. Normal bowel sounds heard. Postsurgical scars, drain Central nervous system: Alert and oriented. No focal neurological deficits. Extremities: No edema, no clubbing ,no cyanosis, distal peripheral pulses palpable. Skin: No rashes, lesions or ulcers,no icterus ,no pallor MSK: Normal muscle bulk,tone ,power Psychiatry: Judgement  and insight appear normal. Mood & affect appropriate.     Data Reviewed: I have personally reviewed following labs and imaging studies  CBC: Recent Labs  Lab 05/27/17 0340 05/28/17 0352  WBC 12.5* 17.6*  HGB  9.0* 8.3*  HCT 29.3* 27.2*  MCV 97.0 99.3  PLT 220 130   Basic Metabolic Panel: Recent Labs  Lab 05/27/17 0340 05/28/17 0352  NA 138 137  137  K 5.1 5.0  5.0  CL 103 103  103  CO2 24 23  23   GLUCOSE 124* 138*  134*  BUN 21* 31*  31*  CREATININE 2.06* 2.60*  2.64*  CALCIUM 7.4* 6.9*  6.9*  MG 1.4* 2.4  PHOS  --  5.3*   GFR: Estimated Creatinine Clearance: 32.1 mL/min (A) (by C-G formula based on SCr of 2.64 mg/dL (H)). Liver Function Tests: Recent Labs  Lab 05/28/17 0352  ALBUMIN 4.1   No results for input(s): LIPASE, AMYLASE in the last 168 hours. No results for input(s): AMMONIA in the last 168 hours. Coagulation Profile: No results for input(s): INR, PROTIME in the last 168 hours. Cardiac Enzymes: No results for input(s): CKTOTAL, CKMB, CKMBINDEX, TROPONINI in the last 168 hours. BNP (last 3 results) No results for input(s): PROBNP in the last 8760 hours. HbA1C: No results for input(s): HGBA1C in the last 72 hours. CBG: No results for input(s): GLUCAP in the last 168 hours. Lipid Profile: No results for input(s): CHOL, HDL, LDLCALC, TRIG, CHOLHDL, LDLDIRECT in the last 72 hours. Thyroid Function Tests: No results for input(s): TSH, T4TOTAL, FREET4, T3FREE, THYROIDAB in the last 72 hours. Anemia Panel: No results for input(s): VITAMINB12, FOLATE, FERRITIN, TIBC, IRON, RETICCTPCT in the last 72 hours. Sepsis Labs: No results for input(s): PROCALCITON, LATICACIDVEN in the last 168 hours.  Recent Results (from the past 240 hour(s))  Surgical pcr screen     Status: None   Collection Time: 05/24/17  1:22 PM  Result Value Ref Range Status   MRSA, PCR NEGATIVE NEGATIVE Final   Staphylococcus aureus NEGATIVE NEGATIVE Final    Comment: (NOTE) The Xpert SA Assay (FDA approved for NASAL specimens in patients 74 years of age and older), is one component of a comprehensive surveillance program. It is not intended to diagnose infection nor to guide or monitor  treatment. Performed at [facility], [place] [address] [place], [place] [address]   MRSA PCR Screening     Status: None   Collection Time: 05/26/17  8:04 PM  Result Value Ref Range Status   MRSA by PCR NEGATIVE NEGATIVE Final    Comment:        The GeneXpert MRSA Assay (FDA approved for NASAL specimens only), is one component of a comprehensive MRSA colonization surveillance program. It is not intended to diagnose MRSA infection nor to guide or monitor treatment for MRSA infections. Performed at [facility] [place] [address] [place], [place] [address]          Radiology Studies: Dg Chest 2 View  Result Date: 05/27/2017 CLINICAL DATA:  57 year old female with shortness of breath. History of small cell lung cancer. Postoperative day 1 status post anterior diaphragmatic Morgagni hernia repair. EXAM: CHEST - 2 VIEW COMPARISON:  05/26/2017 and earlier. FINDINGS: AP and lateral semi upright views at 1513 hours. There is an anterior approach right side pleural or diaphragmatic drain in place coursing from anterior to posterior terminating at the expected location of the right posterior costophrenic angle. There is persistent  subtotal opacification of the right hemithorax with evidence of loculated right lateral pleural fluid (arrow). No pneumothorax identified. The tracheal air column and visible mediastinal contours are stable. The left lung ventilation is stable with no left pleural effusion or confluent opacity identified. Visible bowel gas pattern in the abdomen is normal. Diffusely sclerotic osseous structures redemonstrated. IMPRESSION: 1. Suboptimal right lung re-expansion following right diaphragm/Morgagni hernia repair. Pleural or postoperative drain in place. Stable right lung ventilation since yesterday with loculated right pleural effusion and patchy right lung opacity. 2. Stable, negative left lung. 3. Chronic diffuse sclerotic bone metastases.  Electronically Signed   By: Genevie Ann M.D.   On: 05/27/2017 15:36   US Renal  Result Date: 05/27/2017 CLINICAL DATA:  Acute kidney injury EXAM: RENAL / URINARY TRACT ULTRASOUND COMPLETE COMPARISON:  CT 02/17/2017 FINDINGS: Right Kidney: Length: 9.8 cm. Echogenicity within normal limits. No mass or hydronephrosis visualized. Left Kidney: Length: 10.1 cm. Difficult visualization. Echogenicity within normal limits. No mass or hydronephrosis visualized. Bladder: Urinary bladder is empty. Incidentally noted is increased hepatic echogenicity. IMPRESSION: 1. Suboptimal visualization of the left kidney. Allowing for this, ultrasound appearance of the kidneys is within normal limits 2. Urinary bladder is empty 3. Increased echogenicity of the hepatic parenchyma suggesting steatosis. Electronically Signed   By: Donavan Foil M.D.   On: 05/27/2017 18:05        Scheduled Meds: . acetaminophen  1,000 mg Oral Q8H  . amLODipine  5 mg Oral Daily  . feeding supplement  237 mL Oral BID BM  . fluticasone  2 spray Each Nare Daily  . gabapentin  300 mg Oral TID  . lip balm  1 application Topical BID  . psyllium  1 packet Oral BID  . venlafaxine XR  75 mg Oral Q breakfast   Continuous Infusions: . sodium chloride    . cefTRIAXone (ROCEPHIN)  IV Stopped (05/28/17 1125)  . chlorproMAZINE (THORAZINE) IV    . lactated ringers 1,000 mL (05/27/17 0535)  . ondansetron (ZOFRAN) IV       LOS: 2 days    Time spent: 25 mins.More than 50% of that time was spent in counseling and/or coordination of care.      Marene Lenz, MD [facility] Pager [phone]  If 7PM-7AM, please contact night-coverage [email or web address] Password [email or web address] 05/28/2017, 2:02 PM

## 2017-05-29 ENCOUNTER — Inpatient Hospital Stay (HOSPITAL_COMMUNITY): Payer: Medicare Other

## 2017-05-29 LAB — RENAL FUNCTION PANEL
Albumin: 3.7 g/dL (ref 3.5–5.0)
Anion gap: 10 (ref 5–15)
BUN: 30 mg/dL — ABNORMAL HIGH (ref 6–20)
CO2: 28 mmol/L (ref 22–32)
Calcium: 6.9 mg/dL — ABNORMAL LOW (ref 8.9–10.3)
Chloride: 102 mmol/L (ref 101–111)
Creatinine, Ser: 1.46 mg/dL — ABNORMAL HIGH (ref 0.44–1.00)
GFR calc Af Amer: 45 mL/min — ABNORMAL LOW (ref >60)
GFR calc non Af Amer: 39 mL/min — ABNORMAL LOW (ref >60)
Glucose, Bld: 131 mg/dL — ABNORMAL HIGH (ref 65–99)
Phosphorus: 2.4 mg/dL — ABNORMAL LOW (ref 2.5–4.6)
Potassium: 3.8 mmol/L (ref 3.5–5.1)
Sodium: 140 mmol/L (ref 135–145)

## 2017-05-29 LAB — CBC WITH DIFFERENTIAL/PLATELET
Basophils Absolute: 0 10*3/uL (ref 0.0–0.1)
Basophils Relative: 0 %
Eosinophils Absolute: 0.1 10*3/uL (ref 0.0–0.7)
Eosinophils Relative: 1 %
HCT: 24.6 % — ABNORMAL LOW (ref 36.0–46.0)
HEMOGLOBIN: 7.7 g/dL — AB (ref 12.0–15.0)
Lymphocytes Relative: 10 %
Lymphs Abs: 1.3 10*3/uL (ref 0.7–4.0)
MCH: 30.9 pg (ref 26.0–34.0)
MCHC: 31.3 g/dL (ref 30.0–36.0)
MCV: 98.8 fL (ref 78.0–100.0)
MONOS PCT: 10 %
Monocytes Absolute: 1.2 10*3/uL — ABNORMAL HIGH (ref 0.1–1.0)
NEUTROS PCT: 79 %
Neutro Abs: 9.9 10*3/uL — ABNORMAL HIGH (ref 1.7–7.7)
Platelets: 230 10*3/uL (ref 150–400)
RBC: 2.49 MIL/uL — ABNORMAL LOW (ref 3.87–5.11)
RDW: 18 % — AB (ref 11.5–15.5)
WBC: 12.5 10*3/uL — AB (ref 4.0–10.5)

## 2017-05-29 LAB — URINE CULTURE: CULTURE: NO GROWTH

## 2017-05-29 MED ORDER — FUROSEMIDE 10 MG/ML IJ SOLN
60.0000 mg | Freq: Three times a day (TID) | INTRAMUSCULAR | Status: DC
  Administered 2017-05-29 (×2): 60 mg via INTRAVENOUS
  Filled 2017-05-29 (×2): qty 6

## 2017-05-29 NOTE — Progress Notes (Signed)
3 Days Post-Op   Subjective/Chief Complaint: Feels much better than yesterday. No SOB although sats are low   Objective: Vital signs in last 24 hours: Temp:  [97.4 F (36.3 C)-98.9 F (37.2 C)] 98.9 F (37.2 C) (03/10 0400) Pulse Rate:  [86-100] 98 (03/10 0701) Resp:  [13-22] 17 (03/09 2353) BP: (126-152)/(76-118) 142/87 (03/10 0701) SpO2:  [79 %-100 %] 95 % (03/10 0701) Weight:  [141.8 kg (312 lb 9.8 oz)] 141.8 kg (312 lb 9.8 oz) (03/10 0600)    Intake/Output from previous day: 03/09 0701 - 03/10 0700 In: 820 [P.O.:720; IV Piggyback:100] Out: 2320 [Urine:2125; Drains:195] Intake/Output this shift: No intake/output data recorded.  General appearance: alert and cooperative Resp: clear to auscultation bilaterally Cardio: regular rate and rhythm GI: soft, much less tender. few bs  Lab Results:  Recent Labs    05/28/17 0352 05/29/17 0422  WBC 17.6* 12.5*  HGB 8.3* 7.7*  HCT 27.2* 24.6*  PLT 236 230   BMET Recent Labs    05/28/17 0352 05/29/17 0422  NA 137  137 140  K 5.0  5.0 3.8  CL 103  103 102  CO2 23  23 28   GLUCOSE 138*  134* 131*  BUN 31*  31* 30*  CREATININE 2.60*  2.64* 1.46*  CALCIUM 6.9*  6.9* 6.9*   PT/INR No results for input(s): LABPROT, INR in the last 72 hours. ABG No results for input(s): PHART, HCO3 in the last 72 hours.  Invalid input(s): PCO2, PO2  Studies/Results: Dg Chest 2 View  Result Date: 05/27/2017 CLINICAL DATA:  57 year old female with shortness of breath. History of small cell lung cancer. Postoperative day 1 status post anterior diaphragmatic Morgagni hernia repair. EXAM: CHEST - 2 VIEW COMPARISON:  05/26/2017 and earlier. FINDINGS: AP and lateral semi upright views at 1513 hours. There is an anterior approach right side pleural or diaphragmatic drain in place coursing from anterior to posterior terminating at the expected location of the right posterior costophrenic angle. There is persistent subtotal opacification of  the right hemithorax with evidence of loculated right lateral pleural fluid (arrow). No pneumothorax identified. The tracheal air column and visible mediastinal contours are stable. The left lung ventilation is stable with no left pleural effusion or confluent opacity identified. Visible bowel gas pattern in the abdomen is normal. Diffusely sclerotic osseous structures redemonstrated. IMPRESSION: 1. Suboptimal right lung re-expansion following right diaphragm/Morgagni hernia repair. Pleural or postoperative drain in place. Stable right lung ventilation since yesterday with loculated right pleural effusion and patchy right lung opacity. 2. Stable, negative left lung. 3. Chronic diffuse sclerotic bone metastases. Electronically Signed   By: Genevie Ann M.D.   On: 05/27/2017 15:36   US Renal  Result Date: 05/27/2017 CLINICAL DATA:  Acute kidney injury EXAM: RENAL / URINARY TRACT ULTRASOUND COMPLETE COMPARISON:  CT 02/17/2017 FINDINGS: Right Kidney: Length: 9.8 cm. Echogenicity within normal limits. No mass or hydronephrosis visualized. Left Kidney: Length: 10.1 cm. Difficult visualization. Echogenicity within normal limits. No mass or hydronephrosis visualized. Bladder: Urinary bladder is empty. Incidentally noted is increased hepatic echogenicity. IMPRESSION: 1. Suboptimal visualization of the left kidney. Allowing for this, ultrasound appearance of the kidneys is within normal limits 2. Urinary bladder is empty 3. Increased echogenicity of the hepatic parenchyma suggesting steatosis. Electronically Signed   By: Donavan Foil M.D.   On: 05/27/2017 18:05   Dg Chest Port 1 View  Result Date: 05/28/2017 CLINICAL DATA:  Short of breath and hypoxia. EXAM: PORTABLE CHEST 1 VIEW COMPARISON:  05/27/2017 FINDINGS: Patient rotated right. Mildly degraded exam due to AP portable technique and patient body habitus. Moderate cardiomegaly. Moderate right-sided pleural effusion. Given differences in technique, no significant change  in right-sided aeration, with only minimal aerated right upper lobe remaining. No left-sided pleural fluid or pneumothorax. Suspect patchy left lower lobe airspace disease, similar. Drainage catheter projecting over the lower right hemithorax. Sclerotic osseous metastasis, better visualized yesterday. IMPRESSION: Given differences in technique, no significant change compared to 05/27/2017. Pleuroparenchymal opacification throughout the right hemithorax with patchy left base airspace disease. Electronically Signed   By: Abigail Miyamoto M.D.   On: 05/28/2017 17:01    Anti-infectives: Anti-infectives (From admission, onward)   Start     Dose/Rate Route Frequency Ordered Stop   05/28/17 0915  cefTRIAXone (ROCEPHIN) 1 g in sodium chloride 0.9 % 100 mL IVPB     1 g 200 mL/hr over 30 Minutes Intravenous Daily 05/28/17 0856     05/26/17 2100  cefoTEtan in Dextrose 5% (CEFOTAN) IVPB 2 g     2 g Intravenous Every 12 hours 05/26/17 1208 05/26/17 2133   05/26/17 0538  cefTRIAXone (ROCEPHIN) 2 g in sodium chloride 0.9 % 100 mL IVPB     2 g 200 mL/hr over 30 Minutes Intravenous On call to O.R. 05/26/17 1423 05/26/17 0805   05/26/17 0538  metroNIDAZOLE (FLAGYL) IVPB 500 mg     500 mg 100 mL/hr over 60 Minutes Intravenous On call to O.R. 05/26/17 0538 05/26/17 0827      Assessment/Plan: s/p Procedure(s): XI ROBOTIC REDUTION AND REPAIR OF INCARCERATED ANTERIOR DIAPHRAGMATIC HERNIA OF MORGAGNI WITH MESH (N/A) stay on liquid diet for now  Rocephin stopped yesterday. Wbc elevated and sats low. Will check cxr today Continue to monitor in icu Pod 3 diaphragmatic hernia repair uop good. Cr improving  LOS: 3 days    TOTH III,Xana Bradt S 05/29/2017

## 2017-05-29 NOTE — Progress Notes (Signed)
PT Cancellation Note  Patient Details Name: Beth [name] MRN: [id number] DOB: October 13, 1960   Cancelled Treatment:    Reason Eval/Treat Not Completed: Patient at procedure or test/unavailable   [facility] 05/29/2017, 2:22 PM

## 2017-05-29 NOTE — Progress Notes (Signed)
[facility] Progress Note  Subjective:   Vitals:   05/29/17 0600 05/29/17 0701 05/29/17 0800 05/29/17 1200  BP:  (!) 142/87  (!) 148/81  Pulse:  98    Resp:    11  Temp:   98.2 F (36.8 C) 98.2 F (36.8 C)  TempSrc:   Oral Oral  SpO2:  95%    Weight: (!) 141.8 kg (312 lb 9.8 oz)     Height:        Inpatient medications: . acetaminophen  1,000 mg Oral Q8H  . amLODipine  5 mg Oral BID  . feeding supplement  237 mL Oral BID BM  . fluticasone  2 spray Each Nare Daily  . lip balm  1 application Topical BID  . psyllium  1 packet Oral BID  . venlafaxine XR  75 mg Oral Q breakfast   . cefTRIAXone (ROCEPHIN)  IV Stopped (05/29/17 1130)  . ondansetron (ZOFRAN) IV     alum & mag hydroxide-simeth, bisacodyl, hydrALAZINE, hydrocortisone, hydrocortisone cream, HYDROmorphone (DILAUDID) injection, ipratropium-albuterol, magic mouthwash, menthol-cetylpyridinium, ondansetron (ZOFRAN) IV **OR** ondansetron (ZOFRAN) IV, oxyCODONE, phenol, polyethylene glycol, simethicone   Home meds: - norvasc 5/ HTCZ 25 mg/ lasix 20 qd prn - doxycycline 100 bid/ clinda topical / lamisil 250 qd - tarceva (erlotinib)150 qd - effexor xr 75/ oxy IR prn/ remeron 30 hs - combivent respimat 20 - 100  Exam: Gen adult AAF, nasal O2, appears a bit SOB, not in distress No rash, cyanosis or gangrene Sclera anicteric, throat clear  +JVD Chest bilat mild basilar rales RRR , tachy, 2/6 sem, no RG Abd soft ntnd no mass or ascites +bs obese, mult lap port scars, drain RUQ GU foley w large amount amber urine, clear MS no joint effusions or deformity Ext 2+ LE edema R> L  Neuro is alert, Ox 3 , nf , asterixis better  CXR 3/7, 3/8 > chronic distortion of R chest d/t hernia, L chest clear Renal US > ^'d echo, 10 cm kidneys, no hydro UA 3/8 > hazy, mod LE, neg nit, rare bact, +tntc rbc/ 6-30 wbc UNa 21,  UCreat 207   Impression: 1  Acute renal failure - oliguric, AKI prob due to hypoperfusion/  COX II inhibitor postop and decomp CHF.  Significant improvement with diuresis, creat down 1.45 today. Will cont IV lasix, lower dose a little.  Get ECHO. Will follow.  2  Asterixis - have dc'd neurontin for now 3  SP hernia repair - on 05/26/17 4  HTN - norvasc 5 bid, prn hydral IV 5  Lung Ca - holding tarceva per primary 6  Depression 7  Obesity/ OSA   Plan - as above   Kelly Splinter MD [facility] pager [phone]   05/29/2017, 3:22 PM   Recent Labs  Lab 05/27/17 0340 05/28/17 0352 05/29/17 0422  NA 138 137  137 140  K 5.1 5.0  5.0 3.8  CL 103 103  103 102  CO2 24 23  23 28   GLUCOSE 124* 138*  134* 131*  BUN 21* 31*  31* 30*  CREATININE 2.06* 2.60*  2.64* 1.46*  CALCIUM 7.4* 6.9*  6.9* 6.9*  PHOS  --  5.3* 2.4*   Recent Labs  Lab 05/28/17 0352 05/29/17 0422  ALBUMIN 4.1 3.7   Recent Labs  Lab 05/27/17 0340 05/28/17 0352 05/29/17 0422  WBC 12.5* 17.6* 12.5*  NEUTROABS  --   --  9.9*  HGB 9.0* 8.3* 7.7*  HCT 29.3* 27.2*  24.6*  MCV 97.0 99.3 98.8  PLT 220 236 230   Iron/TIBC/Ferritin/ %Sat No results found for: IRON, TIBC, FERRITIN, IRONPCTSAT

## 2017-05-29 NOTE — Progress Notes (Signed)
PROGRESS NOTE    Beth [name]  HYI:[id number] DOB: May 06, 1960 DOA: 05/26/2017 PCP: [name], MD   Brief Narrative: Patient is a 57 year old female with past medical history of morbid obesity, OSA, noncompliant with CPAP machine, hypertension, lung cancer, fibromyalgia who was admitted for robotic reduction and repair of incarcerated anterior diaphragmatic herni of Morgagni with mesh.  Patient was consulted for the management of acute kidney injury.  Postoperatively, patient was noted to have decreased urinary output with elevated serum creatinine.  Baseline creatinine is normal.   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  Hypoxia: Noted to be satting low.  Chest x-ray shows pleuroparenchymal opacification throughout the right hemithorax with patchy left base airspace disease.  This looks like a chronic finding but pneumonia is not excluded.  Continue antibiotic. Improvement in the white cell counts today.  Acute kidney injury: Kidney function improved today. Possible acute tubular necrosis likely precipitated by the episode of hypotension. US renal did not show any significant finding.  Following.  Given few doses of Lasix .Avoid nephrotoxic medications.  Hypertension: Continue current medication.  We will continue to monitor her blood pressure.  Morbid obesity/OSA: Noncompliant with CPAP.  History of lung cancer: On [name].  Follows with oncology as an outpatient.  Abnormal urinalysis/leukocytosis: Urinalysis looked abnormal.  Urine culture  blood cultures showed no growth.  Anemia: Hemoglobin has dropped since admission.  Associated with surgery? We will continue to monitor H&H and transfuse if needed.   DVT prophylaxis: SCD Code Status: Full Family Communication: None present at the bedside Disposition Plan: As per surgery after resolution of kidney dysfunction   Consultants: Medicine, nephrology  Procedures: Hernia repair  Antimicrobials: Ceftriaxone since 05/28/17  Subjective: Patient seen and examined the bedside this morning.  She says she is feeling better.  Denies any shortness of breath or abdominal pain.  Objective: Vitals:   05/29/17 0500 05/29/17 0600 05/29/17 0701 05/29/17 0800  BP:   (!) 142/87   Pulse:   98   Resp:      Temp:    98.2 F (36.8 C)  TempSrc:    Oral  SpO2:   95%   Weight: (!) 141.8 kg (312 lb 9.8 oz) (!) 141.8 kg (312 lb 9.8 oz)    Height:        Intake/Output Summary (Last 24 hours) at 05/29/2017 1223 Last data filed at 05/29/2017 0644 Gross per 24 hour  Intake 820 ml  Output 2245 ml  Net -1425 ml   Filed Weights   05/26/17 0543 05/29/17 0500 05/29/17 0600  Weight: 127.9 kg (282 lb) (!) 141.8 kg (312 lb 9.8 oz) (!) 141.8 kg (312 lb 9.8 oz)    Examination:  General exam: Not in distress,weak HEENT:PERRL,Oral mucosa moist, Ear/Nose normal on gross exam Respiratory system: Decreased air entry on the right side Cardiovascular system: S1 & S2 heard, RRR. No JVD, murmurs, rubs, gallops or clicks. Gastrointestinal system: Abdomen is nondistended, soft and nontender. No organomegaly or masses felt. Normal bowel sounds heard. Postsurgical scars, drain Central  nervous system: Alert and oriented. No focal neurological deficits. Extremities: No edema, no clubbing ,no cyanosis, distal peripheral pulses palpable. Skin: No rashes, lesions or ulcers,no icterus ,no pallor MSK: Normal muscle bulk,tone ,power Psychiatry: Judgement and insight appear normal. Mood & affect appropriate.       Data Reviewed: I have personally reviewed following labs and imaging studies  CBC: Recent Labs  Lab 05/27/17 0340 05/28/17 0352 05/29/17 0422  WBC 12.5* 17.6* 12.5*  NEUTROABS  --   --  9.9*  HGB 9.0* 8.3* 7.7*  HCT 29.3* 27.2* 24.6*  MCV 97.0 99.3 98.8  PLT 220 236 382   Basic Metabolic Panel: Recent Labs  Lab 05/27/17 0340 05/28/17 0352 05/29/17 0422  NA 138 137  137 140  K 5.1 5.0  5.0 3.8  CL 103 103  103 102  CO2 24 23  23 28   GLUCOSE 124* 138*  134* 131*  BUN 21* 31*  31* 30*  CREATININE 2.06* 2.60*  2.64* 1.46*  CALCIUM 7.4* 6.9*  6.9* 6.9*  MG 1.4* 2.4  --   PHOS  --  5.3* 2.4*   GFR: Estimated Creatinine Clearance: 61.7 mL/min (A) (by C-G formula based on SCr of 1.46 mg/dL (H)). Liver Function Tests: Recent Labs  Lab 05/28/17 0352 05/29/17 0422  ALBUMIN 4.1 3.7   No results for input(s): LIPASE, AMYLASE in the last 168 hours. No results for input(s): AMMONIA in the last 168 hours. Coagulation Profile: No results for input(s): INR, PROTIME in the last 168 hours. Cardiac Enzymes: No results for input(s): CKTOTAL, CKMB, CKMBINDEX, TROPONINI in the last 168 hours. BNP (last 3 results) No results for input(s): PROBNP in the last 8760 hours. HbA1C: No results for input(s): HGBA1C in the last 72 hours. CBG: No results for input(s): GLUCAP in the last 168 hours. Lipid Profile: No results for input(s): CHOL, HDL, LDLCALC, TRIG, CHOLHDL, LDLDIRECT in the last 72 hours. Thyroid Function Tests: No results for input(s): TSH, T4TOTAL, FREET4, T3FREE, THYROIDAB in the last 72 hours. Anemia Panel: No results for input(s): VITAMINB12, FOLATE, FERRITIN, TIBC, IRON, RETICCTPCT in the last 72 hours. Sepsis Labs: No results for input(s): PROCALCITON, LATICACIDVEN in the last 168 hours.  Recent Results (from the past 240 hour(s))  Surgical pcr screen     Status: None   Collection Time: 05/24/17  1:22 PM  Result Value Ref Range Status   MRSA, PCR  NEGATIVE NEGATIVE Final   Staphylococcus aureus NEGATIVE NEGATIVE Final    Comment: (NOTE) The Xpert SA Assay (FDA approved for NASAL specimens in patients 47 years of age and older), is one component of a comprehensive surveillance program. It is not intended to diagnose infection nor to guide or monitor treatment. Performed at [facility], [place] [address] [place], [place] [address]   MRSA PCR Screening     Status: None   Collection Time: 05/26/17  8:04 PM  Result Value Ref Range Status   MRSA by PCR NEGATIVE NEGATIVE Final    Comment:        The GeneXpert MRSA Assay (FDA approved for NASAL specimens only), is one component of a comprehensive MRSA colonization surveillance program. It is not intended to diagnose MRSA infection nor to guide or monitor treatment for MRSA infections. Performed at [facility], [place] [address] [place], [place] [address]   Culture, Urine     Status: None   Collection Time: 05/28/17  8:56 AM  Result Value Ref Range Status  Specimen Description   Final    URINE, RANDOM Performed at [facility], [place] [address] [place], [place] [address]    Special Requests   Final    NONE Performed at [facility], [place] [address] [place], [place] [address]    Culture   Final    NO GROWTH Performed at [facility], [place] [address], [place], [place] [address]    Report Status 05/29/2017 FINAL  Final         Radiology Studies: Dg Chest 2 View  Result Date: 05/27/2017 CLINICAL DATA:  57 year old female with shortness of breath. History of small cell lung cancer. Postoperative day 1 status post anterior diaphragmatic Morgagni hernia repair. EXAM: CHEST - 2 VIEW COMPARISON:  05/26/2017 and earlier. FINDINGS: AP and lateral semi upright views at 1513 hours. There is an anterior approach right side pleural or diaphragmatic drain in place coursing from anterior to posterior  terminating at the expected location of the right posterior costophrenic angle. There is persistent subtotal opacification of the right hemithorax with evidence of loculated right lateral pleural fluid (arrow). No pneumothorax identified. The tracheal air column and visible mediastinal contours are stable. The left lung ventilation is stable with no left pleural effusion or confluent opacity identified. Visible bowel gas pattern in the abdomen is normal. Diffusely sclerotic osseous structures redemonstrated. IMPRESSION: 1. Suboptimal right lung re-expansion following right diaphragm/Morgagni hernia repair. Pleural or postoperative drain in place. Stable right lung ventilation since yesterday with loculated right pleural effusion and patchy right lung opacity. 2. Stable, negative left lung. 3. Chronic diffuse sclerotic bone metastases. Electronically Signed   By: Genevie Ann M.D.   On: 05/27/2017 15:36   US Renal  Result Date: 05/27/2017 CLINICAL DATA:  Acute kidney injury EXAM: RENAL / URINARY TRACT ULTRASOUND COMPLETE COMPARISON:  CT 02/17/2017 FINDINGS: Right Kidney: Length: 9.8 cm. Echogenicity within normal limits. No mass or hydronephrosis visualized. Left Kidney: Length: 10.1 cm. Difficult visualization. Echogenicity within normal limits. No mass or hydronephrosis visualized. Bladder: Urinary bladder is empty. Incidentally noted is increased hepatic echogenicity. IMPRESSION: 1. Suboptimal visualization of the left kidney. Allowing for this, ultrasound appearance of the kidneys is within normal limits 2. Urinary bladder is empty 3. Increased echogenicity of the hepatic parenchyma suggesting steatosis. Electronically Signed   By: Donavan Foil M.D.   On: 05/27/2017 18:05   Dg Chest Port 1 View  Result Date: 05/28/2017 CLINICAL DATA:  Short of breath and hypoxia. EXAM: PORTABLE CHEST 1 VIEW COMPARISON:  05/27/2017 FINDINGS: Patient rotated right. Mildly degraded exam due to AP portable technique and patient  body habitus. Moderate cardiomegaly. Moderate right-sided pleural effusion. Given differences in technique, no significant change in right-sided aeration, with only minimal aerated right upper lobe remaining. No left-sided pleural fluid or pneumothorax. Suspect patchy left lower lobe airspace disease, similar. Drainage catheter projecting over the lower right hemithorax. Sclerotic osseous metastasis, better visualized yesterday. IMPRESSION: Given differences in technique, no significant change compared to 05/27/2017. Pleuroparenchymal opacification throughout the right hemithorax with patchy left base airspace disease. Electronically Signed   By: Abigail Miyamoto M.D.   On: 05/28/2017 17:01        Scheduled Meds: . acetaminophen  1,000 mg Oral Q8H  . amLODipine  5 mg Oral BID  . feeding supplement  237 mL Oral BID BM  . fluticasone  2 spray Each Nare Daily  . lip balm  1 application Topical BID  . psyllium  1 packet Oral  BID  . venlafaxine XR  75 mg Oral Q breakfast   Continuous Infusions: . cefTRIAXone (ROCEPHIN)  IV Stopped (05/28/17 1125)  . ondansetron (ZOFRAN) IV       LOS: 3 days    Time spent: 25 mins.More than 50% of that time was spent in counseling and/or coordination of care.      Marene Lenz, MD [facility] Pager [phone]  If 7PM-7AM, please contact night-coverage [email or web address] Password [facility] 05/29/2017, 12:23 PM

## 2017-05-30 ENCOUNTER — Other Ambulatory Visit: Payer: Self-pay | Admitting: Family Medicine

## 2017-05-30 ENCOUNTER — Inpatient Hospital Stay (HOSPITAL_COMMUNITY): Payer: Medicare Other

## 2017-05-30 DIAGNOSIS — I361 Nonrheumatic tricuspid (valve) insufficiency: Secondary | ICD-10-CM

## 2017-05-30 LAB — ECHOCARDIOGRAM COMPLETE
Height: 65 in
WEIGHTICAEL: 4659.64 [oz_av]

## 2017-05-30 LAB — BASIC METABOLIC PANEL
ANION GAP: 11 (ref 5–15)
BUN: 20 mg/dL (ref 6–20)
CHLORIDE: 98 mmol/L — AB (ref 101–111)
CO2: 35 mmol/L — ABNORMAL HIGH (ref 22–32)
Calcium: 7 mg/dL — ABNORMAL LOW (ref 8.9–10.3)
Creatinine, Ser: 0.76 mg/dL (ref 0.44–1.00)
Glucose, Bld: 127 mg/dL — ABNORMAL HIGH (ref 65–99)
POTASSIUM: 3.3 mmol/L — AB (ref 3.5–5.1)
Sodium: 144 mmol/L (ref 135–145)

## 2017-05-30 LAB — CBC WITH DIFFERENTIAL/PLATELET
BASOS ABS: 0 10*3/uL (ref 0.0–0.1)
BASOS PCT: 0 %
Eosinophils Absolute: 0.1 10*3/uL (ref 0.0–0.7)
Eosinophils Relative: 1 %
HCT: 26.3 % — ABNORMAL LOW (ref 36.0–46.0)
HEMOGLOBIN: 8.2 g/dL — AB (ref 12.0–15.0)
LYMPHS ABS: 1.2 10*3/uL (ref 0.7–4.0)
Lymphocytes Relative: 10 %
MCH: 30.6 pg (ref 26.0–34.0)
MCHC: 31.2 g/dL (ref 30.0–36.0)
MCV: 98.1 fL (ref 78.0–100.0)
Monocytes Absolute: 1.1 10*3/uL — ABNORMAL HIGH (ref 0.1–1.0)
Monocytes Relative: 10 %
NEUTROS PCT: 79 %
Neutro Abs: 9.4 10*3/uL — ABNORMAL HIGH (ref 1.7–7.7)
PLATELETS: 318 10*3/uL (ref 150–400)
RBC: 2.68 MIL/uL — AB (ref 3.87–5.11)
RDW: 17.4 % — ABNORMAL HIGH (ref 11.5–15.5)
WBC: 11.9 10*3/uL — AB (ref 4.0–10.5)

## 2017-05-30 MED ORDER — MIRTAZAPINE 15 MG PO TABS
15.0000 mg | ORAL_TABLET | Freq: Every day | ORAL | Status: DC
  Administered 2017-05-30 – 2017-05-31 (×2): 15 mg via ORAL
  Filled 2017-05-30 (×2): qty 1

## 2017-05-30 MED ORDER — FENTANYL CITRATE (PF) 100 MCG/2ML IJ SOLN
25.0000 ug | INTRAMUSCULAR | Status: DC | PRN
  Administered 2017-05-30: 50 ug via INTRAVENOUS
  Filled 2017-05-30: qty 2

## 2017-05-30 MED ORDER — HALOPERIDOL LACTATE 5 MG/ML IJ SOLN: 2.0000 mg | Freq: Four times a day (QID) | INTRAMUSCULAR | Status: DC | PRN

## 2017-05-30 MED ORDER — ACETAMINOPHEN 500 MG PO TABS
1000.0000 mg | ORAL_TABLET | Freq: Three times a day (TID) | ORAL | Status: DC
  Administered 2017-05-30 – 2017-05-31 (×5): 1000 mg via ORAL
  Filled 2017-05-30 (×7): qty 2

## 2017-05-30 MED ORDER — POTASSIUM CHLORIDE CRYS ER 20 MEQ PO TBCR
40.0000 meq | EXTENDED_RELEASE_TABLET | Freq: Once | ORAL | Status: AC
  Administered 2017-05-30: 40 meq via ORAL
  Filled 2017-05-30: qty 2

## 2017-05-30 MED ORDER — GABAPENTIN 300 MG PO CAPS: 300.0000 mg | ORAL_CAPSULE | Freq: Two times a day (BID) | ORAL | Status: DC

## 2017-05-30 MED ORDER — DIPHENHYDRAMINE HCL 50 MG/ML IJ SOLN
25.0000 mg | Freq: Three times a day (TID) | INTRAMUSCULAR | Status: DC | PRN
  Administered 2017-05-30: 25 mg via INTRAVENOUS
  Filled 2017-05-30: qty 1

## 2017-05-30 MED ORDER — CALCIUM CARBONATE 1250 (500 CA) MG PO TABS
2.0000 | ORAL_TABLET | Freq: Every day | ORAL | Status: DC
  Administered 2017-05-30 – 2017-06-01 (×3): 1000 mg via ORAL
  Filled 2017-05-30: qty 1
  Filled 2017-05-30: qty 2
  Filled 2017-05-30 (×2): qty 1

## 2017-05-30 MED ORDER — PERFLUTREN LIPID MICROSPHERE
1.0000 mL | INTRAVENOUS | Status: AC | PRN
Start: 2017-05-30 — End: 2017-05-30
  Administered 2017-05-30: 2 mL via INTRAVENOUS
  Filled 2017-05-30: qty 10

## 2017-05-30 MED ORDER — GABAPENTIN 300 MG PO CAPS: 300.0000 mg | ORAL_CAPSULE | Freq: Three times a day (TID) | ORAL | Status: DC

## 2017-05-30 MED ORDER — PERFLUTREN LIPID MICROSPHERE
INTRAVENOUS | Status: AC
  Filled 2017-05-30: qty 10

## 2017-05-30 MED ORDER — LORAZEPAM 2 MG/ML IJ SOLN: 0.5000 mg | Freq: Three times a day (TID) | INTRAMUSCULAR | Status: DC | PRN

## 2017-05-30 MED ORDER — HYDROGEN PEROXIDE 3 % EX SOLN
CUTANEOUS | Status: AC
  Filled 2017-05-30: qty 473

## 2017-05-30 MED ORDER — ERGOCALCIFEROL 1.25 MG (50000 UT) PO CAPS
50000.0000 [IU] | ORAL_CAPSULE | ORAL | Status: DC
  Administered 2017-05-31: 50000 [IU] via ORAL
  Filled 2017-05-30: qty 1

## 2017-05-30 MED ORDER — ERLOTINIB HCL 150 MG PO TABS
150.0000 mg | ORAL_TABLET | Freq: Every day | ORAL | Status: DC
  Administered 2017-05-30 – 2017-06-01 (×3): 150 mg via ORAL

## 2017-05-30 MED ORDER — ALUM & MAG HYDROXIDE-SIMETH 200-200-20 MG/5ML PO SUSP: 30.0000 mL | Freq: Four times a day (QID) | ORAL | Status: DC | PRN

## 2017-05-30 NOTE — Progress Notes (Signed)
PT Cancellation Note  Patient Details Name: Beth [name] MRN: [id number] DOB: Oct 16, 1960   Cancelled Treatment:    Reason Eval/Treat Not Completed: Patient not medically ready, confusion overnight. Will check back this PM per RN recommendation.    Claretha Cooper 05/30/2017, 9:38 AM Tresa Endo PT [phone]

## 2017-05-30 NOTE — Progress Notes (Signed)
[facility]  [address] [place], [place] [address] Phone: [phone]  FAX: [phone]      Beth [name] [id number] 1960-06-30  CARE TEAM:  PCP: [name], MD  Outpatient Care Team: Patient Care Team: [name], MD as PCP - Huston Foley, MD as Consulting Physician (General Surgery) Curt Bears, MD as Consulting Physician (Oncology)  Inpatient Treatment Team: Treatment Team: Attending Provider: Michael Boston, MD; Technician: Linton Flemings, NT; Consulting Physician: Curt Bears, MD; Consulting Physician: Florencia Reasons, MD; Consulting Physician: Bonnielee Haff, MD; Consulting Physician: Threasa Beards, MD; Consulting Physician: Marene Lenz, MD; Consulting Physician: Roney Jaffe, MD; Registered Nurse: Danie Chandler, RN; Technician: Wynn Maudlin, NT; Physical Therapist: Fuller Mandril, PT; Registered Nurse: Jeannie Fend, RN   Problem List:   Principal Problem:   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/26/2017 Active Problems:   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  Most likely dehydration versus pulmonary failure  Plan:  Okay to transfer to floor.  Adjusting pain medications.  Do not know if she is sundowning.  Agree with diuresis.  Defer to nephrology.  DC Foley catheter.  Doubt UTI but  will defer antibiotic treatment to medicine  Following pulmonary status with her pulling out her chest tube prematurely.  No pneumothorax.  -ARF resolved.  Holding any potentially nephrotoxic agents.  Did not receive any perioperative NSAIDs.  EBL was minimal.  We had lowered intra-abdominal insufflation during the surgery for a fair part of the time to avoid poor venous return.  Suspect she has some baseline chronic kidney disease.  Advance diet as tolerated.  Concern for bone mets on CXR - left note for Dr Earlie Server to see/evaluate.  He is aware of her chronic bone metastases.  Restart [name]  -VTE prophylaxis- SCDs, etc  -mobilize as tolerated to help recovery  40 minutes spent in review, evaluation, examination, counseling, and coordination of care.  More than 50% of that time was spent in counseling.  Adin Hector, M.D., F.A.C.S. Gastrointestinal and Minimally Invasive Surgery [facility] [address] [place], [place] [address] [phone] Main / Paging   05/30/2017    Subjective: (Chief complaint)  Feeling sore.  A little short of breath.  No nausea or vomiting.  Got confused and tried to go to bathroom.  Pulled out 19Fr Blake chest tube.  Objective:  Vital signs:  Vitals:   05/30/17 0300 05/30/17 0400 05/30/17 0500 05/30/17 0600  BP: 138/80 129/88 (!) 132/104 (!) 151/109  Pulse: (!) 101 (!) 103 97 96  Resp: 17 (!) 7 (!) 5 13  Temp:  97.6 F (36.4 C)    TempSrc:  Axillary    SpO2: (!) 79% 97% 97% 100%  Weight:    132.1 kg (291 lb 3.6 oz)  Height:  Last BM Date: 05/29/17  Intake/Output   Yesterday:  03/10 0701 - 03/11 0700 In: -  Out: 0240 [Urine:5250; Drains:120] This shift:  No intake/output data recorded.  Bowel function:  Flatus: YES  BM:  YES  Drain: (No drain)   Physical Exam:  General: Pt awake/alert/oriented x4 in mild acute distress.  Sleepy and tired but talking fine.   Eyes:  PERRL, normal EOM.  Sclera clear.  No icterus Neuro: CN II-XII intact w/o focal sensory/motor deficits. Lymph: No head/neck/groin lymphadenopathy Psych:  No delerium/psychosis/paranoia.  Not agitated.  Not anxious. HENT: Normocephalic, Mucus membranes moist.  No thrush Neck: Supple, No tracheal deviation Chest: No rhonchi.  No major crackles.  Mild chest soreness with deep breaths anteriorly at stitches.  CV:  Pulses intact.  Regular rhythm MS: Normal AROM mjr joints.  No obvious deformity  Abdomen: Soft.  Nondistended.  Mildly tender at incisions only.  No evidence of peritonitis.  No incarcerated hernias.  All dressings removed  Ext:  No deformity.  No mjr edema.  No cyanosis Skin: No petechiae / purpura  Results:   Labs: Results for orders placed or performed during the hospital encounter of 05/26/17 (from the past 48 hour(s))  Culture, Urine     Status: None   Collection Time: 05/28/17  8:56 AM  Result Value Ref Range   Specimen Description      URINE, RANDOM Performed at [facility] [address], [place], [place] [address]    Special Requests      NONE Performed at [facility], [place] [address], [place], [place] [address]    Culture      NO GROWTH Performed at [facility], [place] [address], [place], [place] [address]    Report Status 05/29/2017 FINAL   Culture, blood (routine x 2)     Status: None (Preliminary result)   Collection Time: 05/28/17 11:56 AM  Result Value Ref Range   Specimen Description      BLOOD LEFT HAND Performed at [facility] [address] [place], [place] [address]    Special Requests      BOTTLES DRAWN AEROBIC ONLY Blood Culture adequate volume Performed at [facility] [address] [place], [place] [address]    Culture      NO GROWTH < 24 HOURS Performed at [facility] [address] [place], [place] [address]    Report Status PENDING    Renal function panel     Status: Abnormal   Collection Time: 05/29/17  4:22 AM  Result Value Ref Range   Sodium 140 135 - 145 mmol/L   Potassium 3.8 3.5 - 5.1 mmol/L    Comment: DELTA CHECK NOTED   Chloride 102 101 - 111 mmol/L   CO2 28 22 - 32 mmol/L   Glucose, Bld 131 (H) 65 - 99 mg/dL   BUN 30 (H) 6 - 20 mg/dL   Creatinine, Ser 1.46 (H) 0.44 - 1.00 mg/dL    Comment: DELTA CHECK NOTED   Calcium 6.9 (L) 8.9 - 10.3 mg/dL   Phosphorus 2.4 (L) 2.5 - 4.6 mg/dL   Albumin 3.7 3.5 - 5.0 g/dL   GFR calc non Af Amer 39 (L) >60 mL/min   GFR calc Af Amer 45 (L) >60 mL/min    Comment: (NOTE) The eGFR has been calculated using the CKD EPI equation. This calculation has not been validated in all clinical situations. eGFR's persistently <60  mL/min signify possible Chronic Kidney Disease.    Anion gap 10 5 - 15    Comment: Performed at [facility], [place] [address], [place], [place] [address]  CBC with Differential/Platelet     Status: Abnormal   Collection Time: 05/29/17  4:22 AM  Result Value Ref Range   WBC 12.5 (H) 4.0 - 10.5 K/uL   RBC 2.49 (L) 3.87 - 5.11 MIL/uL   Hemoglobin 7.7 (L) 12.0 - 15.0 g/dL   HCT 24.6 (L) 36.0 - 46.0 %   MCV 98.8 78.0 - 100.0 fL   MCH 30.9 26.0 - 34.0 pg   MCHC 31.3 30.0 - 36.0 g/dL   RDW 18.0 (H) 11.5 - 15.5 %   Platelets 230 150 - 400 K/uL   Neutrophils Relative % 79 %   Neutro Abs 9.9 (H) 1.7 - 7.7 K/uL   Lymphocytes Relative 10 %   Lymphs Abs 1.3 0.7 - 4.0 K/uL   Monocytes Relative 10 %   Monocytes Absolute 1.2 (H) 0.1 - 1.0 K/uL   Eosinophils Relative 1 %   Eosinophils Absolute 0.1 0.0 - 0.7 K/uL   Basophils Relative 0 %   Basophils Absolute 0.0 0.0 - 0.1 K/uL    Comment: Performed at [facility], [place] [address], [place], [place] [address]  CBC with Differential/Platelet     Status: Abnormal   Collection Time: 05/30/17  3:26 AM  Result Value Ref Range   WBC 11.9 (H) 4.0 - 10.5 K/uL   RBC 2.68 (L) 3.87 -  5.11 MIL/uL   Hemoglobin 8.2 (L) 12.0 - 15.0 g/dL   HCT 26.3 (L) 36.0 - 46.0 %   MCV 98.1 78.0 - 100.0 fL   MCH 30.6 26.0 - 34.0 pg   MCHC 31.2 30.0 - 36.0 g/dL   RDW 17.4 (H) 11.5 - 15.5 %   Platelets 318 150 - 400 K/uL   Neutrophils Relative % 79 %   Neutro Abs 9.4 (H) 1.7 - 7.7 K/uL   Lymphocytes Relative 10 %   Lymphs Abs 1.2 0.7 - 4.0 K/uL   Monocytes Relative 10 %   Monocytes Absolute 1.1 (H) 0.1 - 1.0 K/uL   Eosinophils Relative 1 %   Eosinophils Absolute 0.1 0.0 - 0.7 K/uL   Basophils Relative 0 %   Basophils Absolute 0.0 0.0 - 0.1 K/uL    Comment: Performed at [facility], [place] [address] [place], [place] [address]  Basic metabolic panel     Status: Abnormal   Collection Time: 05/30/17  3:26 AM  Result Value Ref Range   Sodium 144 135 - 145 mmol/L   Potassium 3.3 (L) 3.5 - 5.1 mmol/L   Chloride 98 (L) 101 - 111 mmol/L   CO2 35 (H) 22 - 32 mmol/L   Glucose, Bld 127 (H) 65 - 99 mg/dL   BUN 20 6 - 20 mg/dL   Creatinine, Ser 0.76 0.44 - 1.00 mg/dL   Calcium 7.0 (L) 8.9 - 10.3 mg/dL   GFR calc non Af Amer >60 >60 mL/min   GFR calc Af Amer >60 >60 mL/min    Comment: (NOTE) The eGFR has been calculated using the CKD EPI equation. This calculation has not been validated in all clinical situations. eGFR's persistently <60 mL/min signify possible Chronic Kidney Disease.    Anion gap 11 5 - 15    Comment: Performed at [facility], [place] [address] [place], [place] [address]   *Note: Due  to a large number of results and/or encounters for the requested time period, some results have not been displayed. A complete set of results can be found in Results Review.    Imaging / Studies: Dg Chest 2 View  Result Date: 05/29/2017 CLINICAL DATA:  Admitted x 3 days ago for robotic reduction and repair of incarcerated anterior diaphragmatic herni of Morgagni with mesh. Patient was consulted for the management of acute kidney injury. Hx lung ca,  copd, morbid obesity. EXAM: CHEST - 2 VIEW COMPARISON:  05/28/2017 FINDINGS: Right lung opacity and right pleural effusion are without change from the previous day's study. Volume loss on the right is also stable. Mild atelectasis at the left lung base.  Left lung otherwise clear. No pneumothorax. There is a tube curled in the right lower hemithorax stable from the prior exam. IMPRESSION: 1. No significant change from the most recent prior study. Persistent right lung opacity and volume loss, consistent with atelectasis. Persistent right pleural effusion. Electronically Signed   By: Lajean Manes M.D.   On: 05/29/2017 14:08   Dg Chest Port 1 View  Result Date: 05/28/2017 CLINICAL DATA:  Short of breath and hypoxia. EXAM: PORTABLE CHEST 1 VIEW COMPARISON:  05/27/2017 FINDINGS: Patient rotated right. Mildly degraded exam due to AP portable technique and patient body habitus. Moderate cardiomegaly. Moderate right-sided pleural effusion. Given differences in technique, no significant change in right-sided aeration, with only minimal aerated right upper lobe remaining. No left-sided pleural fluid or pneumothorax. Suspect patchy left lower lobe airspace disease, similar. Drainage catheter projecting over the lower right hemithorax. Sclerotic osseous metastasis, better visualized yesterday. IMPRESSION: Given differences in technique, no significant change compared to 05/27/2017. Pleuroparenchymal opacification throughout the right hemithorax with patchy left base airspace disease. Electronically Signed   By: Abigail Miyamoto M.D.   On: 05/28/2017 17:01    Medications / Allergies: per chart  Antibiotics: Anti-infectives (From admission, onward)   Start     Dose/Rate Route Frequency Ordered Stop   05/28/17 0915  cefTRIAXone (ROCEPHIN) 1 g in sodium chloride 0.9 % 100 mL IVPB     1 g 200 mL/hr over 30 Minutes Intravenous Daily 05/28/17 0856     05/26/17 2100  cefoTEtan in Dextrose 5% (CEFOTAN) IVPB 2 g     2 g  Intravenous Every 12 hours 05/26/17 1208 05/26/17 2133   05/26/17 0538  cefTRIAXone (ROCEPHIN) 2 g in sodium chloride 0.9 % 100 mL IVPB     2 g 200 mL/hr over 30 Minutes Intravenous On call to O.R. 05/26/17 0258 05/26/17 0805   05/26/17 0538  metroNIDAZOLE (FLAGYL) IVPB 500 mg     500 mg 100 mL/hr over 60 Minutes Intravenous On call to O.R. 05/26/17 5277 05/26/17 0827        Note: Portions of this report may have been transcribed using voice recognition software. Every effort was made to ensure accuracy; however, inadvertent computerized transcription errors may be present.   Any transcriptional errors that result from this process are unintentional.     Adin Hector, M.D., F.A.C.S. Gastrointestinal and Minimally Invasive Surgery [facility] [address] [place], [place] [address] [phone] Main / Paging   05/30/2017

## 2017-05-30 NOTE — Progress Notes (Signed)
  Echocardiogram 2D Echocardiogram has been performed.  Merrie Roof F 05/30/2017, 9:27 AM

## 2017-05-30 NOTE — Progress Notes (Signed)
Pt had climbed out of bed and went to the bathroom and pulled out her JP drain around 2300 pm. Just spoke with the oncall surgeon to make them aware and toldr her placed gauze over the site. She gave no new orders at this time. Will cont to monitor site for drainage and pt for complcations

## 2017-05-30 NOTE — Progress Notes (Signed)
PT Cancellation Note  Patient Details Name: Beth [name] MRN: [id number] DOB: 23-Jul-1960   Cancelled Treatment:    Reason Eval/Treat Not Completed: Fatigue/lethargy limiting ability to participate;Patient declined.    Claretha Cooper 05/30/2017, 2:46 PM  Tresa Endo PT [phone]

## 2017-05-30 NOTE — Progress Notes (Signed)
Patient arrived to room 1540. She is C/O "bruises on my arms that seem to be getting bigger". Reddened areas to bilateral antecubital spaces that do resemble bruising. Patient stated she has had BP cuffs on in those areas. Skin is intact and areas do not feel warm to touch. No edema noted to areas.  Donne Hazel, RN

## 2017-05-30 NOTE — Progress Notes (Signed)
[facility] Progress Note  Subjective:  No c/o Lasix held this AM, high UOP eGFR back to normal, K 3.3 and Na 144 TTE LVEF 60-65% no LVH  I/O last 3 completed shifts: In: 62 [P.O.:720; IV Piggyback:100] Out: 7390 [Urine:7150; Drains:240]   Vitals:   05/30/17 0700 05/30/17 0800 05/30/17 0819 05/30/17 0958  BP: (!) 141/100 133/81  135/81  Pulse: 96 (!) 103 (!) 101   Resp: '20 17 14   ' Temp:      TempSrc:      SpO2: 99%  97%   Weight:      Height:        Inpatient medications: . acetaminophen  1,000 mg Oral TID WC & HS  . amLODipine  5 mg Oral BID  . calcium carbonate  2 tablet Oral Q breakfast  . erlotinib  150 mg Oral Daily  . feeding supplement  237 mL Oral BID BM  . fluticasone  2 spray Each Nare Daily  . hydrogen peroxide      . lip balm  1 application Topical BID  . psyllium  1 packet Oral BID  . venlafaxine XR  75 mg Oral Q breakfast   . ondansetron (ZOFRAN) IV     alum & mag hydroxide-simeth, bisacodyl, diphenhydrAMINE, fentaNYL (SUBLIMAZE) injection, haloperidol lactate, hydrALAZINE, hydrocortisone, hydrocortisone cream, ipratropium-albuterol, LORazepam, magic mouthwash, menthol-cetylpyridinium, ondansetron (ZOFRAN) IV **OR** ondansetron (ZOFRAN) IV, oxyCODONE, phenol, polyethylene glycol, simethicone   Home meds: - norvasc 5/ HTCZ 25 mg/ lasix 20 qd prn - doxycycline 100 bid/ clinda topical / lamisil 250 qd - tarceva (erlotinib)150 qd - effexor xr 75/ oxy IR prn/ remeron 30 hs - combivent respimat 20 - 100  Exam: Gen adult AAF, NAD CTAB RRR , regular no RG Abd soft ntnd n +bs obese Trace LEE Neuro is alert, Ox 3 , nf , asterixis better  CXR 3/7, 3/8 > chronic distortion of R chest d/t hernia, L chest clear Renal US > ^'d echo, 10 cm kidneys, no hydro UA 3/8 > hazy, mod LE, neg nit, rare bact, +tntc rbc/ 6-30 wbc UNa 21,  UCreat 207   Impression: 1  Resolved Acute renal failure - oliguric, AKI prob due to hypoperfusion/ COX II  inhibitor postop.  Stop diuretics 2  SP hernia repair - on 05/26/17 3  HTN - stable; norvasc 5 bid 4  Lung Ca - tarceva per primary 4  Depression 6  Obesity/ OSA   Plan - Will sign off for now.  Please call with any questions or concerns.  Pt does not need follow up with nephrology  Pearson Grippe MD [phone] pgr 05/30/2017, 12:00 PM   Recent Labs  Lab 05/28/17 0352 05/29/17 0422 05/30/17 0326  NA 137  137 140 144  K 5.0  5.0 3.8 3.3*  CL 103  103 102 98*  CO2 '23  23 28 ' 35*  GLUCOSE 138*  134* 131* 127*  BUN 31*  31* 30* 20  CREATININE 2.60*  2.64* 1.46* 0.76  CALCIUM 6.9*  6.9* 6.9* 7.0*  PHOS 5.3* 2.4*  --    Recent Labs  Lab 05/28/17 0352 05/29/17 0422  ALBUMIN 4.1 3.7   Recent Labs  Lab 05/28/17 0352 05/29/17 0422 05/30/17 0326  WBC 17.6* 12.5* 11.9*  NEUTROABS  --  9.9* 9.4*  HGB 8.3* 7.7* 8.2*  HCT 27.2* 24.6* 26.3*  MCV 99.3 98.8 98.1  PLT 236 230 318   Iron/TIBC/Ferritin/ %Sat No results found for: IRON, TIBC, FERRITIN, IRONPCTSAT

## 2017-05-30 NOTE — Progress Notes (Signed)
PROGRESS NOTE    Beth [name]  IWP:[id number] DOB: [birth year] DOA: 05/26/2017 PCP: [name], MD   Brief Narrative: Patient is a 57 year old female with past medical history of morbid obesity, OSA, noncompliant with CPAP machine, hypertension, lung cancer, fibromyalgia who was admitted for robotic reduction and repair of incarcerated anterior diaphragmatic herni of Morgagni with mesh.  Patient was consulted for the management of acute kidney injury.  Postoperatively, patient was noted to have decreased urinary output with elevated serum creatinine.  Baseline creatinine is normal.   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  Hypoxia: Saturating fine on supplemental oxygen.  Will recommend monitoring saturation without oxygenation . Last CXR shows persistent right lung opacity and volume loss, consistent with atelectasis. Persistent right pleural effusion.This is a chronic finding.  Acute kidney injury: Kidney function improved today and is on baseline. Possible acute tubular necrosis likely precipitated by the episode of hypotension. US renal did not show any significant finding.  Nephrology following.  Given few doses of Lasix .Avoid nephrotoxic medications.  Hypertension: Continue current medications.  We will continue to monitor her blood pressure.  Morbid obesity/OSA: Noncompliant with CPAP.  History of lung cancer: On [name].  Follows with oncology as an outpatient.  Abnormal urinalysis/leukocytosis: Urinalysis looked abnormal.  Urine culture  blood cultures showed no growth.On ceftriaxone with improvement in the white cell counts.  Will discontinue soon.  Anemia: Hemoglobin has dropped since admission.  Associated with surgery? We will continue to monitor H&H and transfuse if needed.   DVT prophylaxis: SCD Code Status: Full Family Communication: None present at the bedside Disposition Plan: As per surgery    Consultants: Medicine, nephrology  Procedures: Hernia repair  Antimicrobials: Ceftriaxone since 05/28/17  Subjective: Patient seen and examined the bedside this morning comfortable.  Denies any complaints.  No overnight fever, nausea or vomiting.  Was reported to be slightly confused this morning.  Looked well during my evaluation.  Objective: Vitals:   05/30/17 0700 05/30/17 0800 05/30/17 0819 05/30/17 0958  BP: (!) 141/100 133/81  135/81  Pulse: 96 (!) 103 (!) 101   Resp: 20 17 14    Temp:      TempSrc:      SpO2: 99%  97%   Weight:      Height:        Intake/Output Summary (Last 24 hours) at 05/30/2017 1048 Last data filed at 05/30/2017 0400 Gross per 24 hour  Intake -  Output 5370 ml  Net -5370 ml   Filed Weights   05/29/17 0500 05/29/17 0600 05/30/17 0600  Weight: (!) 141.8 kg (312 lb 9.8 oz) (!) 141.8 kg (312 lb 9.8 oz) 132.1 kg (291 lb 3.6 oz)    Examination:  General exam: Appears calm and comfortable ,Not in distress,average built HEENT:PERRL,Oral mucosa moist, Ear/Nose normal on gross exam Respiratory system: Decreased air entry in the right side  cardiovascular system: S1 & S2 heard, RRR. No JVD, murmurs, rubs, gallops or clicks. Gastrointestinal system: Abdomen is nondistended, soft and nontender. No organomegaly or masses  felt. Normal bowel sounds heard. Surgical scars Central nervous system: Alert and oriented. No focal neurological deficits. Extremities: No edema, no clubbing ,no cyanosis, distal peripheral pulses palpable. Skin: No rashes, lesions or ulcers,no icterus ,no pallor MSK:  Normal muscle bulk,tone ,power Psychiatry: Judgement and insight appear normal. Mood & affect appropriate.         Data Reviewed: I have personally reviewed following labs and imaging studies  CBC: Recent Labs  Lab 05/27/17 0340 05/28/17 0352 05/29/17 0422 05/30/17 0326  WBC 12.5* 17.6* 12.5* 11.9*  NEUTROABS  --   --  9.9* 9.4*  HGB 9.0* 8.3* 7.7* 8.2*  HCT 29.3* 27.2* 24.6* 26.3*  MCV 97.0 99.3 98.8 98.1  PLT 220 236 230 710   Basic Metabolic Panel: Recent Labs  Lab 05/27/17 0340 05/28/17 0352 05/29/17 0422 05/30/17 0326  NA 138 137  137 140 144  K 5.1 5.0  5.0 3.8 3.3*  CL 103 103  103 102 98*  CO2 24 23  23 28  35*  GLUCOSE 124* 138*  134* 131* 127*  BUN 21* 31*  31* 30* 20  CREATININE 2.06* 2.60*  2.64* 1.46* 0.76  CALCIUM 7.4* 6.9*  6.9* 6.9* 7.0*  MG 1.4* 2.4  --   --   PHOS  --  5.3* 2.4*  --    GFR: Estimated Creatinine Clearance: 107.8 mL/min (by C-G formula based on SCr of 0.76 mg/dL). Liver Function Tests: Recent Labs  Lab 05/28/17 0352 05/29/17 0422  ALBUMIN 4.1 3.7   No results for input(s): LIPASE, AMYLASE in the last 168 hours. No results for input(s): AMMONIA in the last 168 hours. Coagulation Profile: No results for input(s): INR, PROTIME in the last 168 hours. Cardiac Enzymes: No results for input(s): CKTOTAL, CKMB, CKMBINDEX, TROPONINI in the last 168 hours. BNP (last 3 results) No results for input(s): PROBNP in the last 8760 hours. HbA1C: No results for input(s): HGBA1C in the last 72 hours. CBG: No results for input(s): GLUCAP in the last 168 hours. Lipid Profile: No results for input(s): CHOL, HDL, LDLCALC, TRIG, CHOLHDL, LDLDIRECT in the last 72 hours. Thyroid Function Tests: No results for input(s): TSH, T4TOTAL, FREET4, T3FREE, THYROIDAB in the last 72 hours. Anemia Panel: No results for input(s): VITAMINB12, FOLATE, FERRITIN, TIBC, IRON, RETICCTPCT in the last 72 hours. Sepsis Labs: No results for input(s):  PROCALCITON, LATICACIDVEN in the last 168 hours.  Recent Results (from the past 240 hour(s))  Surgical pcr screen     Status: None   Collection Time: 05/24/17  1:22 PM  Result Value Ref Range Status   MRSA, PCR NEGATIVE NEGATIVE Final   Staphylococcus aureus NEGATIVE NEGATIVE Final    Comment: (NOTE) The Xpert SA Assay (FDA approved for NASAL specimens in patients 64 years of age and older), is one component of a comprehensive surveillance program. It is not intended to diagnose infection nor to guide or monitor treatment. Performed at [facility], [place] [address] [place], [place] [address]   MRSA PCR Screening     Status: None   Collection Time: 05/26/17  8:04 PM  Result Value Ref Range Status   MRSA by PCR NEGATIVE NEGATIVE Final    Comment:        The GeneXpert MRSA Assay (FDA approved for NASAL specimens only), is one component of a comprehensive MRSA colonization surveillance program. It is not intended to diagnose MRSA infection nor to guide or monitor treatment for MRSA infections. Performed at [facility],  [place] [address] [place], [place] [address]   Culture, Urine     Status: None   Collection Time: 05/28/17  8:56 AM  Result Value Ref Range Status   Specimen Description   Final    URINE, RANDOM Performed at [facility] [address] [place], [place] [address]    Special Requests   Final    NONE Performed at [facility], [place] [address], [place], [place] [address]    Culture   Final    NO GROWTH Performed at [facility], [place] [address] [place], [place] [address]    Report Status 05/29/2017 FINAL  Final  Culture, blood (routine x 2)     Status: None (Preliminary result)   Collection Time: 05/28/17 11:56 AM  Result Value Ref Range Status   Specimen Description   Final    BLOOD LEFT HAND Performed at [place] [address], [place], [place]  [address]    Special Requests   Final    BOTTLES DRAWN AEROBIC ONLY Blood Culture adequate volume Performed at [place] [address] [place],  [address]    Culture   Final    NO GROWTH < 24 HOURS Performed at [place] [address] [place],  [address]    Report Status PENDING  Incomplete         Radiology Studies: Dg Chest 2 View  Result Date: 05/29/2017 CLINICAL DATA:  Admitted x 3 days ago for robotic reduction and repair of incarcerated anterior diaphragmatic herni of Morgagni with mesh. Patient was consulted for the management of acute kidney injury. Hx lung ca, copd, morbid obesity. EXAM: CHEST - 2 VIEW COMPARISON:  05/28/2017 FINDINGS: Right lung opacity and right pleural effusion are without change from the previous day's study. Volume loss on the right is also stable. Mild atelectasis at the left lung base.  Left lung otherwise clear. No pneumothorax. There is a tube curled in the right lower hemithorax stable from the prior exam. IMPRESSION: 1. No significant change from the most recent prior study. Persistent right lung opacity and volume loss, consistent with atelectasis. Persistent right pleural effusion. Electronically Signed   By: Lajean Manes M.D.   On: 05/29/2017 14:08   Dg Chest Port 1 View  Result Date: 05/28/2017 CLINICAL DATA:  Short of breath and hypoxia. EXAM: PORTABLE CHEST 1 VIEW COMPARISON:  05/27/2017 FINDINGS: Patient rotated right. Mildly degraded exam due to AP portable technique and patient body habitus. Moderate cardiomegaly. Moderate right-sided pleural effusion. Given differences in technique, no significant change in right-sided aeration, with only minimal aerated right upper lobe remaining. No left-sided pleural fluid or pneumothorax. Suspect patchy left lower lobe airspace disease, similar. Drainage catheter projecting over the lower right hemithorax. Sclerotic osseous metastasis, better visualized  yesterday. IMPRESSION: Given differences in technique, no significant change compared to 05/27/2017. Pleuroparenchymal opacification throughout the right hemithorax with patchy left base airspace disease. Electronically Signed   By: Abigail Miyamoto M.D.   On: 05/28/2017 17:01        Scheduled Meds: . acetaminophen  1,000 mg Oral TID WC & HS  . amLODipine  5 mg Oral BID  . calcium carbonate  2 tablet Oral Q breakfast  . erlotinib  150 mg Oral Daily  . feeding supplement  237 mL Oral BID BM  . fluticasone  2 spray Each Nare Daily  . hydrogen peroxide      .  lip balm  1 application Topical BID  . psyllium  1 packet Oral BID  . venlafaxine XR  75 mg Oral Q breakfast   Continuous Infusions: . ondansetron (ZOFRAN) IV       LOS: 4 days    Time spent: 25 mins.More than 50% of that time was spent in counseling and/or coordination of care.      Marene Lenz, MD [facility] Pager [phone]  If 7PM-7AM, please contact night-coverage [email or web address] Password [facility] 05/30/2017, 10:48 AM

## 2017-05-31 LAB — CBC WITH DIFFERENTIAL/PLATELET
BASOS ABS: 0 10*3/uL (ref 0.0–0.1)
Basophils Relative: 0 %
Eosinophils Absolute: 0.3 10*3/uL (ref 0.0–0.7)
Eosinophils Relative: 3 %
HEMATOCRIT: 32.9 % — AB (ref 36.0–46.0)
Hemoglobin: 10 g/dL — ABNORMAL LOW (ref 12.0–15.0)
LYMPHS ABS: 2 10*3/uL (ref 0.7–4.0)
LYMPHS PCT: 18 %
MCH: 30.1 pg (ref 26.0–34.0)
MCHC: 30.4 g/dL (ref 30.0–36.0)
MCV: 99.1 fL (ref 78.0–100.0)
MONO ABS: 1.7 10*3/uL — AB (ref 0.1–1.0)
MONOS PCT: 15 %
NEUTROS ABS: 7.1 10*3/uL (ref 1.7–7.7)
Neutrophils Relative %: 64 %
Platelets: 315 10*3/uL (ref 150–400)
RBC: 3.32 MIL/uL — ABNORMAL LOW (ref 3.87–5.11)
RDW: 17 % — AB (ref 11.5–15.5)
WBC: 11 10*3/uL — ABNORMAL HIGH (ref 4.0–10.5)

## 2017-05-31 LAB — BASIC METABOLIC PANEL
ANION GAP: 13 (ref 5–15)
BUN: 13 mg/dL (ref 6–20)
CO2: 32 mmol/L (ref 22–32)
Calcium: 7.7 mg/dL — ABNORMAL LOW (ref 8.9–10.3)
Chloride: 99 mmol/L — ABNORMAL LOW (ref 101–111)
Creatinine, Ser: 0.65 mg/dL (ref 0.44–1.00)
GFR calc Af Amer: >60 mL/min (ref >60)
GLUCOSE: 92 mg/dL (ref 65–99)
POTASSIUM: 3.7 mmol/L (ref 3.5–5.1)
Sodium: 144 mmol/L (ref 135–145)

## 2017-05-31 MED ORDER — OXYCODONE HCL 5 MG PO TABS
5.0000 mg | ORAL_TABLET | ORAL | Status: DC | PRN
  Administered 2017-05-31: 10 mg via ORAL
  Filled 2017-05-31: qty 2

## 2017-05-31 MED ORDER — METHOCARBAMOL 500 MG PO TABS
750.0000 mg | ORAL_TABLET | Freq: Four times a day (QID) | ORAL | Status: DC
  Administered 2017-05-31 – 2017-06-01 (×3): 750 mg via ORAL
  Filled 2017-05-31 (×4): qty 2

## 2017-05-31 MED ORDER — DIPHENHYDRAMINE HCL 25 MG PO CAPS: 25.0000 mg | ORAL_CAPSULE | Freq: Three times a day (TID) | ORAL | Status: DC | PRN

## 2017-05-31 MED ORDER — PSYLLIUM 95 % PO PACK
1.0000 | PACK | Freq: Every day | ORAL | Status: DC
  Filled 2017-05-31 (×2): qty 1

## 2017-05-31 NOTE — Progress Notes (Signed)
PROGRESS NOTE    Beth [name]  NAT:[id number] DOB: 10-Apr-1960 DOA: 05/26/2017 PCP: [name], MD   Brief Narrative: Patient is a 57 year old female with past medical history of morbid obesity, OSA, noncompliant with CPAP machine, hypertension, lung cancer, fibromyalgia who was admitted for robotic reduction and repair of incarcerated anterior diaphragmatic herni of Morgagni with mesh.  Patient was consulted for the management of acute kidney injury.  Postoperatively, patient was noted to have decreased urinary output with elevated serum creatinine.  Baseline creatinine is normal.  Nephrology consulted.  Now her kidney function is at baseline.  Surgery planning for discharge.   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  Hypoxia: Saturating fine on supplemental oxygen.  But she is not on oxygen at home.  She desaturates without oxygen . Last CXR shows persistent right lung opacity and volume loss, consistent with atelectasis. Persistent right pleural effusion.This is a chronic finding. She might need oxygen at home.  Acute kidney injury: Kidney function remains stable now with diuretics which have been stopped. Possible acute tubular necrosis likely precipitated by the episode of hypotension. US renal did not show any significant finding.  Nephrology following.  Given few doses of Lasix .Avoid nephrotoxic medications.  Hypertension: Continue current medications.  We will continue to monitor her blood pressure.  Morbid obesity/OSA: Noncompliant with  CPAP.  History of lung cancer : On [name].  Follows with oncology as an outpatient,Dr Mohamed.  Abnormal urinalysis/leukocytosis: Urinalysis looked abnormal.  Urine culture blood cultures showed no growth.On ceftriaxone with improvement in the white cell counts.  Will discontinue it.  Anemia: H and H stable   DVT prophylaxis: SCD Code Status: Full Family Communication: None present at the bedside Disposition Plan: As per surgery    Consultants: Medicine, nephrology  Procedures: Hernia repair  Antimicrobials: Ceftriaxone since 05/28/17-05/31/17  Subjective: Patient seen and examined the bedside this morning comfortable.  Denies any complaints.  No overnight fever, nausea or vomiting.   Patient desaturates without oxygen.  Objective: Vitals:   05/30/17 2246 05/31/17 0104 05/31/17 0543 05/31/17 1322  BP: 116/82  (!) 126/92 105/65  Pulse: 100  97 97  Resp: 20  20 18   Temp: 98.4 F (36.9 C) 98.3 F (36.8 C) 98.3 F (36.8 C) 97.7 F (36.5 C)  TempSrc:  Oral Oral Oral  SpO2: 100%  100% 100%  Weight:   129.2 kg (284 lb 13.4 oz)   Height:        Intake/Output Summary (Last 24 hours) at 05/31/2017 1532 Last data filed at 05/31/2017 4270 Gross per 24 hour  Intake 240 ml  Output -  Net 240 ml   Filed Weights   05/29/17 0600 05/30/17 0600 05/31/17 0543  Weight: (!) 141.8 kg (312 lb 9.8 oz) 132.1 kg (291 lb 3.6 oz) 129.2 kg (284 lb 13.4 oz)    Examination:  General exam: Not in distress,average built HEENT:PERRL,Oral mucosa moist, Ear/Nose normal on gross exam Respiratory system: Bilateral decreased air entry more than right , bilateral basal crackles cardiovascular system: S1 &  S2 heard, RRR. No JVD, murmurs, rubs, gallops or clicks. Gastrointestinal system: Abdomen is nondistended, soft and nontender. No organomegaly or masses felt. Normal bowel sounds heard. Surgical scars Central nervous system: Alert and oriented. No focal neurological deficits. Extremities: No  edema, no clubbing ,no cyanosis, distal peripheral pulses palpable. Skin: No rashes, lesions or ulcers,no icterus ,no pallor MSK: Normal muscle bulk,tone ,power Psychiatry: Judgement and insight appear normal. Mood & affect appropriate.    Data Reviewed: I have personally reviewed following labs and imaging studies  CBC: Recent Labs  Lab 05/27/17 0340 05/28/17 0352 05/29/17 0422 05/30/17 0326 05/31/17 0416  WBC 12.5* 17.6* 12.5* 11.9* 11.0*  NEUTROABS  --   --  9.9* 9.4* 7.1  HGB 9.0* 8.3* 7.7* 8.2* 10.0*  HCT 29.3* 27.2* 24.6* 26.3* 32.9*  MCV 97.0 99.3 98.8 98.1 99.1  PLT 220 236 230 318 297   Basic Metabolic Panel: Recent Labs  Lab 05/27/17 0340 05/28/17 0352 05/29/17 0422 05/30/17 0326 05/31/17 0416  NA 138 137  137 140 144 144  K 5.1 5.0  5.0 3.8 3.3* 3.7  CL 103 103  103 102 98* 99*  CO2 24 23  23 28  35* 32  GLUCOSE 124* 138*  134* 131* 127* 92  BUN 21* 31*  31* 30* 20 13  CREATININE 2.06* 2.60*  2.64* 1.46* 0.76 0.65  CALCIUM 7.4* 6.9*  6.9* 6.9* 7.0* 7.7*  MG 1.4* 2.4  --   --   --   PHOS  --  5.3* 2.4*  --   --    GFR: Estimated Creatinine Clearance: 106.5 mL/min (by C-G formula based on SCr of 0.65 mg/dL). Liver Function Tests: Recent Labs  Lab 05/28/17 0352 05/29/17 0422  ALBUMIN 4.1 3.7   No results for input(s): LIPASE, AMYLASE in the last 168 hours. No results for input(s): AMMONIA in the last 168 hours. Coagulation Profile: No results for input(s): INR, PROTIME in the last 168 hours. Cardiac Enzymes: No results for input(s): CKTOTAL, CKMB, CKMBINDEX, TROPONINI in the last 168 hours. BNP (last 3 results) No results for input(s): PROBNP in the last 8760 hours. HbA1C: No results for input(s): HGBA1C in the last 72 hours. CBG: No results for input(s): GLUCAP in the last 168 hours. Lipid Profile: No results for input(s): CHOL, HDL, LDLCALC, TRIG, CHOLHDL, LDLDIRECT in the last 72 hours. Thyroid Function Tests: No results for  input(s): TSH, T4TOTAL, FREET4, T3FREE, THYROIDAB in the last 72 hours. Anemia Panel: No results for input(s): VITAMINB12, FOLATE, FERRITIN, TIBC, IRON, RETICCTPCT in the last 72 hours. Sepsis Labs: No results for input(s): PROCALCITON, LATICACIDVEN in the last 168 hours.  Recent Results (from the past 240 hour(s))  Surgical pcr screen     Status: None   Collection Time: 05/24/17  1:22 PM  Result Value Ref Range Status   MRSA, PCR NEGATIVE NEGATIVE Final   Staphylococcus aureus NEGATIVE NEGATIVE Final    Comment: (NOTE) The Xpert SA Assay (FDA approved for NASAL specimens in patients 54 years of age and older), is one component of a comprehensive surveillance program. It is not intended to diagnose infection nor to guide or monitor treatment. Performed at [facility], [place] [address], [place], [place] [address]   MRSA PCR Screening     Status: None   Collection Time: 05/26/17  8:04 PM  Result Value Ref Range Status   MRSA by PCR NEGATIVE NEGATIVE Final    Comment:        The GeneXpert  MRSA Assay (FDA approved for NASAL specimens only), is one component of a comprehensive MRSA colonization surveillance program. It is not intended to diagnose MRSA infection nor to guide or monitor treatment for MRSA infections. Performed at [facility], [place] [address] [place], [place] [address]   Culture, Urine     Status: None   Collection Time: 05/28/17  8:56 AM  Result Value Ref Range Status   Specimen Description   Final    URINE, RANDOM Performed at [place] [address] [place], [place] [address]    Special Requests   Final    NONE Performed at [facility], [place] [address], [place], [place] [address]    Culture   Final    NO GROWTH Performed at [facility], [place] [address], [place], [place] [address]    Report Status 05/29/2017 FINAL  Final  Culture, blood (routine x 2)     Status: None  (Preliminary result)   Collection Time: 05/28/17 11:56 AM  Result Value Ref Range Status   Specimen Description   Final    BLOOD LEFT HAND Performed at [place] [address] [place], [place] [address]    Special Requests   Final    BOTTLES DRAWN AEROBIC ONLY Blood Culture adequate volume Performed at [place] [address] [place], [place] [address]    Culture   Final    NO GROWTH 3 DAYS Performed at [facility], [place] [address], [place], [place] [address]    Report Status PENDING  Incomplete  Culture, blood (Routine X 2) w Reflex to ID Panel     Status: None (Preliminary result)   Collection Time: 05/29/17  4:22 AM  Result Value Ref Range Status   Specimen Description   Final    BLOOD LEFT ANTECUBITAL Performed at [place] [address] [place], [place] [address]    Special Requests   Final    BOTTLES DRAWN AEROBIC AND ANAEROBIC Blood Culture adequate volume Performed at [place] [address], [place], [place] [address]    Culture   Final    NO GROWTH 2 DAYS Performed at [facility] [address], [place], [place] [address]    Report Status PENDING  Incomplete         Radiology Studies: No results found.      Scheduled Meds: . acetaminophen  1,000 mg Oral TID WC & HS  . amLODipine  5 mg Oral BID  . calcium carbonate  2 tablet Oral Q breakfast  . ergocalciferol  50,000 Units Oral Weekly  . erlotinib  150 mg Oral Daily  . feeding supplement  237 mL Oral BID BM  . fluticasone  2 spray Each Nare Daily  . lip balm  1 application Topical BID  . methocarbamol  750 mg Oral QID  . mirtazapine  15 mg Oral QHS  . psyllium  1 packet Oral Daily  . venlafaxine XR  75 mg Oral Q breakfast   Continuous Infusions: . ondansetron (ZOFRAN) IV       LOS: 5 days    Time spent: 25 mins.     Marene Lenz, MD [facility] Pager  [phone]  If 7PM-7AM, please contact night-coverage [email or web address] Password [facility] 05/31/2017, 3:32 PM

## 2017-05-31 NOTE — Care Management Important Message (Signed)
Important Message  Patient Details  Name: Waniya Hoglund MRN: [id number] Date of Birth: 1960/10/09   Medicare Important Message Given:  Yes    Kerin Salen 05/31/2017, 11:23 [place] Message  Patient Details  Name: Shaquetta Arcos MRN: [id number] Date of Birth: [birth year]   Medicare Important Message Given:  Yes    Kerin Salen 05/31/2017, 11:23 AM

## 2017-05-31 NOTE — Progress Notes (Signed)
[facility]  [address] [place], [place] [address] Phone: [phone]  FAX: [phone]      Beth [name] [id number] November 12, 1960  CARE TEAM:  PCP: [name], MD  Outpatient Care Team: Patient Care Team: [name], MD as PCP - Huston Foley, MD as Consulting Physician (General Surgery) Curt Bears, MD as Consulting Physician (Oncology)  Inpatient Treatment Team: Treatment Team: Attending Provider: Michael Boston, MD; Technician: Linton Flemings, NT; Consulting Physician: Curt Bears, MD; Consulting Physician: Florencia Reasons, MD; Consulting Physician: Bonnielee Haff, MD; Consulting Physician: Threasa Beards, MD; Consulting Physician: Marene Lenz, MD; Consulting Physician: Roney Jaffe, MD; Registered Nurse: Danie Chandler, RN; Registered Nurse: Consuela Mimes, RN; Technician: Gardiner Ramus, NT; Technician: Lucas Mallow, NT; Registered Nurse: Terrial Rhodes, RN; Physical Therapist: Fuller Mandril, PT; Registered Nurse: Ivy Lynn, RN   Problem List:   Principal Problem:   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/26/2017 Active Problems:   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  While she says her pain is under control, she says it hurts too much to  get out of bed.  We will try increased pain control.  I strongly recommend that she let the nurses and therapist help her recover.  She needs to start getting.  Otherwise every skilled nursing facility more aggressive rehab however.  She really wants to avoid that.  The best way is to prove to me that she can safely mobilized.  Hopefully she will get there in the next few days.    Adjusting pain medications.  Try and increase gradually so she can mobilize more comfortably do not know if she is sundowning.  Improved off oxygen.  Hemoglobin increased most likely reflection of diuresis is truly falsely low due to hemodilution from resuscitation needed initially for ATN  Following pulmonary status with her pulling out her chest tube prematurely.  No pneumothorax.  Right-sided atelectasis and possible contusion reinflation.  Stable to improving  Advance diet as tolerated.  Concern for bone mets on CXR - left note for Dr Earlie Server to see/evaluate.  He is aware of her chronic bone metastases.  Restart [name]  Bowel regimen.  She was embarrassed/frustrated that she had diarrhea loose bowel movement yesterday we will try and back off and find a regimen that will help her avoid regular bowel habits.  Colonoscopy screening as needed in the future  -VTE prophylaxis- SCDs, etc  -mobilize as tolerated to help recovery  30 minutes spent in review, evaluation, examination, counseling, and coordination of care.  More than 50% of that time was spent in counseling.  Adin Hector, M.D., F.A.C.S. Gastrointestinal and Minimally Invasive Surgery [facility] [address] [place], [place] [address] [phone] Main / Paging  05/31/2017    Subjective: (Chief complaint)  Staying in bed.  Complains of soreness but feels a medicine to help.  Breathing a little bit better.  Tolerating solid food..  Had large loose diarrhea bowel movement.  Frustrated and  embarrassed but struggles with irregular bowels for some time..  Objective:  Vital signs:  Vitals:   05/30/17 1545 05/30/17 2246 05/31/17 0104 05/31/17 0543  BP:  116/82  (!) 126/92  Pulse:  100  97  Resp:  20  20  Temp:  98.4 F (36.9 C) 98.3 F (36.8 C) 98.3 F (36.8 C)  TempSrc:   Oral Oral  SpO2: 90% 100%  100%  Weight:    129.2 kg (284 lb 13.4 oz)  Height:        Last BM Date: 05/30/17  Intake/Output   Yesterday:  03/11 0701 - 03/12 0700 In: 480 [P.O.:480] Out: -  This shift:  No intake/output data recorded.  Bowel function:  Flatus: YES  BM:  YES  Drain: (No drain)   Physical Exam:  General: Pt awake/alert/oriented x4 in no acute distress.  Sleeping but awakens in no acute distress.  Depressed but not sickly/toxic    Eyes: PERRL, normal EOM.  Sclera clear.  No icterus Neuro: CN II-XII intact w/o focal sensory/motor deficits. Lymph: No head/neck/groin lymphadenopathy Psych:  No delerium/psychosis/paranoia.  Not agitated.  Not anxious. HENT: Normocephalic, Mucus membranes moist.  No thrush Neck: Supple, No tracheal deviation Chest: No rhonchi.  No major crackles.  Mild chest soreness with deep breaths anteriorly at stitches.  CV:  Pulses intact.  Regular rhythm MS: Normal AROM mjr joints.  No obvious deformity  Abdomen: Soft.  Nondistended.  Mildly tender at incisions only.  No evidence of peritonitis.  No incarcerated hernias.  All dressings removed  Ext:  No deformity.  No mjr edema.  No cyanosis Skin: No petechiae / purpura  Results:   Labs: Results for orders placed or performed during the hospital encounter of 05/26/17 (from the past 48 hour(s))  CBC with Differential/Platelet     Status: Abnormal   Collection Time: 05/30/17  3:26 AM  Result Value Ref Range   WBC 11.9 (H) 4.0 - 10.5 K/uL   RBC 2.68 (L) 3.87 - 5.11 MIL/uL   Hemoglobin 8.2 (L) 12.0 - 15.0 g/dL   HCT 26.3 (L) 36.0 - 46.0 %   MCV 98.1 78.0 - 100.0 fL   MCH 30.6 26.0 - 34.0 pg    MCHC 31.2 30.0 - 36.0 g/dL   RDW 17.4 (H) 11.5 - 15.5 %   Platelets 318 150 - 400 K/uL   Neutrophils Relative % 79 %   Neutro Abs 9.4 (H) 1.7 - 7.7 K/uL   Lymphocytes Relative 10 %   Lymphs Abs 1.2 0.7 - 4.0 K/uL   Monocytes Relative 10 %   Monocytes Absolute 1.1 (H) 0.1 - 1.0 K/uL   Eosinophils Relative 1 %   Eosinophils Absolute 0.1 0.0 - 0.7 K/uL   Basophils Relative 0 %   Basophils Absolute 0.0 0.0 - 0.1 K/uL    Comment: Performed at [facility], [place] [address], [place], [place] [address]  Basic metabolic panel     Status: Abnormal   Collection Time: 05/30/17  3:26 AM  Result Value Ref Range   Sodium 144 135 - 145 mmol/L   Potassium 3.3 (L) 3.5 - 5.1 mmol/L   [place] 98 (L) 101 - 111 mmol/L   CO2 35 (H) 22 - 32  mmol/L   Glucose, Bld 127 (H) 65 - 99 mg/dL   BUN 20 6 - 20 mg/dL   Creatinine, Ser 0.76 0.44 - 1.00 mg/dL   Calcium 7.0 (L) 8.9 - 10.3 mg/dL   GFR calc non Af Amer >60 >60 mL/min   GFR calc Af Amer >60 >60 mL/min    Comment: (NOTE) The eGFR has been calculated using the CKD EPI equation. This calculation has not been validated in all clinical situations. eGFR's persistently <60 mL/min signify possible Chronic Kidney Disease.    Anion gap 11 5 - 15    Comment: Performed at [facility], [place] [address], [place], [place] [address]  Basic metabolic panel     Status: Abnormal   Collection Time: 05/31/17  4:16 AM  Result Value Ref Range   Sodium 144 135 - 145 mmol/L   Potassium 3.7 3.5 - 5.1 mmol/L   [place] 99 (L) 101 - 111 mmol/L   CO2 32 22 - 32 mmol/L   Glucose, Bld 92 65 - 99 mg/dL   BUN 13 6 - 20 mg/dL   Creatinine, Ser 0.65 0.44 - 1.00 mg/dL   Calcium 7.7 (L) 8.9 - 10.3 mg/dL   GFR calc non Af Amer >60 >60 mL/min   GFR calc Af Amer >60 >60 mL/min    Comment: (NOTE) The eGFR has been calculated using the CKD EPI equation. This calculation has not been validated in all clinical situations. eGFR's persistently  <60 mL/min signify possible Chronic Kidney Disease.    Anion gap 13 5 - 15    Comment: Performed at [facility], [place] [address], [place], [place] [address]  CBC with Differential/Platelet     Status: Abnormal   Collection Time: 05/31/17  4:16 AM  Result Value Ref Range   WBC 11.0 (H) 4.0 - 10.5 K/uL   RBC 3.32 (L) 3.87 - 5.11 MIL/uL   Hemoglobin 10.0 (L) 12.0 - 15.0 g/dL   HCT 32.9 (L) 36.0 - 46.0 %   MCV 99.1 78.0 - 100.0 fL   MCH 30.1 26.0 - 34.0 pg   MCHC 30.4 30.0 - 36.0 g/dL   RDW 17.0 (H) 11.5 - 15.5 %   Platelets 315 150 - 400 K/uL   Neutrophils Relative % 64 %   Neutro Abs 7.1 1.7 - 7.7 K/uL   Lymphocytes Relative 18 %   Lymphs Abs 2.0 0.7 - 4.0 K/uL   Monocytes Relative 15 %   Monocytes Absolute 1.7 (H) 0.1 - 1.0 K/uL   Eosinophils Relative 3 %   Eosinophils Absolute 0.3 0.0 - 0.7 K/uL   Basophils Relative 0 %   Basophils Absolute 0.0 0.0 - 0.1 K/uL    Comment: Performed at [facility], [place] [address] [place], [place] [address]   *Note: Due to a large number of results and/or encounters for the requested time period, some results have not been displayed. A complete set of results can be found in Results Review.    Imaging / Studies: Dg Chest 2 View  Result Date: 05/29/2017 CLINICAL DATA:  Admitted x 3 days ago for robotic reduction and repair of incarcerated anterior diaphragmatic herni of Morgagni with mesh. Patient was consulted for the management of acute kidney injury. Hx lung ca, copd, morbid obesity. EXAM: CHEST - 2 VIEW COMPARISON:  05/28/2017 FINDINGS: Right lung opacity and right pleural effusion are without change from the previous day's study. Volume loss on the right is also stable. Mild  atelectasis at the left lung base.  Left lung otherwise clear. No pneumothorax. There is a tube curled in the right lower hemithorax stable from the prior exam. IMPRESSION: 1. No significant change from the most recent prior study.  Persistent right lung opacity and volume loss, consistent with atelectasis. Persistent right pleural effusion. Electronically Signed   By: Lajean Manes M.D.   On: 05/29/2017 14:08    Medications / Allergies: per chart  Antibiotics: Anti-infectives (From admission, onward)   Start     Dose/Rate Route Frequency Ordered Stop   05/28/17 0915  cefTRIAXone (ROCEPHIN) 1 g in sodium chloride 0.9 % 100 mL IVPB  Status:  Discontinued     1 g 200 mL/hr over 30 Minutes Intravenous Daily 05/28/17 0856 05/30/17 1021   05/26/17 2100  cefoTEtan in Dextrose 5% (CEFOTAN) IVPB 2 g     2 g Intravenous Every 12 hours 05/26/17 1208 05/26/17 2133   05/26/17 0538  cefTRIAXone (ROCEPHIN) 2 g in sodium chloride 0.9 % 100 mL IVPB     2 g 200 mL/hr over 30 Minutes Intravenous On call to O.R. 05/26/17 2707 05/26/17 0805   05/26/17 0538  metroNIDAZOLE (FLAGYL) IVPB 500 mg     500 mg 100 mL/hr over 60 Minutes Intravenous On call to O.R. 05/26/17 8675 05/26/17 0827        Note: Portions of this report may have been transcribed using voice recognition software. Every effort was made to ensure accuracy; however, inadvertent computerized transcription errors may be present.   Any transcriptional errors that result from this process are unintentional.     Adin Hector, M.D., F.A.C.S. Gastrointestinal and Minimally Invasive Surgery [facility] [address], [place], [place] [address] [phone] Main / Paging   05/31/2017

## 2017-05-31 NOTE — Evaluation (Addendum)
Physical Therapy Evaluation Patient Details Name: Beth [name] MRN: [id number] DOB: 1960-07-17 Today's Date: 05/31/2017   History of Present Illness  Admitted 05/26/17 XI ROBOTIC REDUCTION AND REPAIR OF INCARCERATED ANTERIOR DIAPHRAGMATIC HERNIA   Clinical Impression  The patient required exceptional amount of time and stimulation to participate in PT eval after havingmhad pain medication. Patient did ambulate in room. Pt admitted with above diagnosis. Pt currently with functional limitations due to the deficits listed below (see PT Problem List). Pt will benefit from skilled PT to increase their independence and safety with mobility to allow discharge to the venue listed below.  .    Follow Up Recommendations Home health PT(if pt progresses)    Equipment Recommendations  Rolling walker with 5" wheels    Recommendations for Other Services   OT    Precautions / Restrictions        Mobility  Bed Mobility Overal bed mobility: Needs Assistance Bed Mobility: Supine to Sit     Supine to sit: Supervision     General bed mobility comments: with MUCH CUES AND ENCOURAGEMENT AND STIMULATION, PATIEN FINALLY ASSISTED SELF TO SITTING ON BEDSIDE  Transfers Overall transfer level: Needs assistance Equipment used: Rolling walker (2 wheeled) Transfers: Sit to/from Stand Sit to Stand: Min guard         General transfer comment: WITH MUCH DELAY AND ENCOURSGEMENT, THE PATIENT FINALLY STOOD AT RW.   Ambulation/Gait Ambulation/Gait assistance: Min assist Ambulation Distance (Feet): 20 Feet(x 2) Assistive device: Rolling walker (2 wheeled);1 person hand held assist Gait Pattern/deviations: Step-to pattern;Step-through pattern     General Gait Details: with much encouragem,ent, patient ambulated to BR with RW and without RW from BR.  Extra time and redirection .  Stairs            Wheelchair Mobility    Modified Rankin (Stroke Patients Only)       Balance                                              Pertinent Vitals/Pain Pain Assessment: No/denies pain    Home Living Family/patient expects to be discharged to:: Private residence Living Arrangements: Children Available Help at Discharge: Family Type of Home: Apartment Home Access: Level entry     Home Layout: One level Home Equipment: None      Prior Function Level of Independence: Independent               Hand Dominance        Extremity/Trunk Assessment   Upper Extremity Assessment Upper Extremity Assessment: Generalized weakness    Lower Extremity Assessment Lower Extremity Assessment: Generalized weakness    Cervical / Trunk Assessment Cervical / Trunk Assessment: Normal  Communication   Communication: No difficulties  Cognition Arousal/Alertness: Lethargic Behavior During Therapy: Flat affect Overall Cognitive Status: Difficult to assess                                 General Comments: The patient required max cues and stimulation to participate in therapy eval,       General Comments      Exercises     Assessment/Plan    PT Assessment Patient needs continued PT services  PT Problem List Decreased strength;Decreased knowledge of use of DME;Decreased activity tolerance;Decreased safety awareness;Decreased mobility;Cardiopulmonary status limiting  activity       PT Treatment Interventions DME instruction;Therapeutic exercise;Gait training;Functional mobility training;Therapeutic activities    PT Goals (Current goals can be found in the Care Plan section)  Acute Rehab PT Goals Patient Stated Goal: to go home PT Goal Formulation: With patient Time For Goal Achievement: 06/14/17 Potential to Achieve Goals: Good    Frequency Min 3X/week   Barriers to discharge Decreased caregiver support      Co-evaluation               AM-PAC PT "6 Clicks" Daily Activity  Outcome Measure Difficulty turning over in bed (including  adjusting bedclothes, sheets and blankets)?: A Little Difficulty moving from lying on back to sitting on the side of the bed? : A Little   Help needed moving to and from a bed to chair (including a wheelchair)?: A Lot Help needed walking in hospital room?: A Lot Help needed climbing 3-5 steps with a railing? : Total 6 Click Score: 11    End of Session   Activity Tolerance: Patient limited by lethargy Patient left: in chair;with call bell/phone within reach;with chair alarm set Nurse Communication: Mobility status PT Visit Diagnosis: Unsteadiness on feet (R26.81);Difficulty in walking, not elsewhere classified (R26.2)    Time: 9892-1194 PT Time Calculation (min) (ACUTE ONLY): 24 min   Charges:     PT Treatments $Gait Training: 8-22 mins Evaluation low   PT G CodesTresa Endo PT [place] 05/31/2017, 1:43 PM

## 2017-05-31 NOTE — Progress Notes (Signed)
PHARMACIST - PHYSICIAN COMMUNICATION  DR: GROSS CONCERNING: IV to Oral Route Change Policy  RECOMMENDATION: This patient is receiving diphenhydramine by the intravenous route.  Based on criteria approved by the Pharmacy and Therapeutics Committee, intravenous diphenhydramine is being converted to the equivalent oral dose form(s).   DESCRIPTION: These criteria include:  Diphenhydramine is not prescribed to treat or prevent a severe allergic reaction  Diphenhydramine is not prescribed as premedication prior to receiving blood product, biologic medication, antimicrobial, or chemotherapy agent  The patient has tolerated at least one dose of an oral or enteral medication  The patient has no evidence of active gastrointestinal bleeding or impaired GI absorption (gastrectomy, short bowel, patient on TNA or NPO).  The patient is not undergoing procedural sedation   If you have questions about this conversion, please contact the Pharmacy Department  []   [phone] )  [facility] []   [phone] )  [facility] []   [phone] )  [facility] []   [phone] )  [facility] [x]   [phone] )  [facility]    Lindell Spar, PharmD, [place] Pager: [phone] 05/31/2017 10:47 AM

## 2017-06-01 LAB — BASIC METABOLIC PANEL
ANION GAP: 8 (ref 5–15)
BUN: 10 mg/dL (ref 6–20)
CO2: 36 mmol/L — AB (ref 22–32)
Calcium: 7.8 mg/dL — ABNORMAL LOW (ref 8.9–10.3)
Chloride: 103 mmol/L (ref 101–111)
Creatinine, Ser: 0.61 mg/dL (ref 0.44–1.00)
GFR calc Af Amer: >60 mL/min (ref >60)
GLUCOSE: 101 mg/dL — AB (ref 65–99)
POTASSIUM: 3.2 mmol/L — AB (ref 3.5–5.1)
Sodium: 147 mmol/L — ABNORMAL HIGH (ref 135–145)

## 2017-06-01 LAB — MAGNESIUM: Magnesium: 2 mg/dL (ref 1.7–2.4)

## 2017-06-01 MED ORDER — POTASSIUM CHLORIDE CRYS ER 20 MEQ PO TBCR
40.0000 meq | EXTENDED_RELEASE_TABLET | Freq: Every day | ORAL | Status: DC
  Administered 2017-06-01: 40 meq via ORAL
  Filled 2017-06-01: qty 2

## 2017-06-01 MED ORDER — OXYCODONE HCL 5 MG PO TABS: 5.0000 mg | ORAL_TABLET | Freq: Four times a day (QID) | ORAL | 0 refills | Status: DC | PRN

## 2017-06-01 MED ORDER — OXYCODONE HCL 10 MG PO TABS: 10.0000 mg | ORAL_TABLET | Freq: Four times a day (QID) | ORAL | 0 refills | Status: DC | PRN

## 2017-06-01 MED ORDER — METHOCARBAMOL 750 MG PO TABS: 750.0000 mg | ORAL_TABLET | Freq: Four times a day (QID) | ORAL | 2 refills | Status: AC

## 2017-06-01 NOTE — Progress Notes (Signed)
New orders for home 02. Desaturation screen done by nursing and [facility] alerted of need for 02. Marney Doctor RN,BSN,NCM [phone]

## 2017-06-01 NOTE — Care Management Note (Signed)
Case Management Note  Patient Details  Name: Matylda Fehring MRN: [id number] Date of Birth: May 10, 1960  Subjective/Objective:     57 yo admitted with Hernia of anterior diaphragm with repair.               Action/Plan: From home with children. PT recommendations gone over with patient at bedside. Patient declines home health services at this time and states that she doesn't need a rolling walker.  Expected Discharge Date:  06/01/17               Expected Discharge Plan:  Home/Self Care  In-House Referral:     Discharge planning Services  CM Consult  Post Acute Care Choice:  Home Health Choice offered to:  Patient  DME Arranged:    DME Agency:     HH Arranged:    [place] Agency:     Status of Service:  Completed, signed off  If discussed at [employer] of Stay Meetings, dates discussed:    Additional CommentsLynnell Catalan, RN 06/01/2017, 9:58 AM [phone]

## 2017-06-01 NOTE — Progress Notes (Signed)
Pt discharged home with son in stable condition. Discharge instructions and scripts given. Pt verbalized understanding. No immediate questions or concerns. Discharge from unit via wheelchair.

## 2017-06-01 NOTE — Progress Notes (Signed)
Pt refusing to have [name] taken down to pharmacy. States she is going home today. Also says she takes at 7am daily on empty stomach and request to take it now.

## 2017-06-01 NOTE — Evaluation (Signed)
Occupational Therapy Evaluation Patient Details Name: Beth [name] MRN: [id number] DOB: 02-21-1961 Today's Date: 06/01/2017    History of Present Illness Admitted 05/26/17 XI ROBOTIC REDUCTION AND REPAIR OF INCARCERATED ANTERIOR DIAPHRAGMATIC HERNIA    Clinical Impression   Pt admitted for hernia repair. Pt currently with functional limitations due to the deficits listed below (see OT Problem List).  Pt will benefit from skilled OT to increase their safety and independence with ADL and functional mobility for ADL to facilitate discharge to venue listed below.      Follow Up Recommendations  No OT follow up    Equipment Recommendations  None recommended by OT    Recommendations for Other Services       Precautions / Restrictions Precautions Precautions: Fall      Mobility Bed Mobility Overal bed mobility: Needs Assistance Bed Mobility: Supine to Sit     Supine to sit: Supervision        Transfers Overall transfer level: Needs assistance Equipment used: Rolling walker (2 wheeled) Transfers: Sit to/from [facility] Sit to Stand: Min guard Stand pivot transfers: Min guard                ADL either performed or assessed with clinical judgement   ADL Overall ADL's : Needs assistance/impaired Eating/Feeding: Set up;Sitting   Grooming: Set up;Sitting   Upper Body Bathing: Set up;Sitting   Lower Body Bathing: Moderate assistance;Sit to/from stand;Cueing for safety;Cueing for sequencing   Upper Body Dressing : Set up;Sitting   Lower Body Dressing: Moderate assistance;Sit to/from stand;Cueing for compensatory techniques;Cueing for sequencing   Toilet Transfer: Minimal assistance;Comfort height toilet;Regular Toilet;Ambulation   Toileting- Clothing Manipulation and Hygiene: Minimal assistance;Sit to/from stand;Cueing for safety;Cueing for sequencing                Extremity/Trunk Assessment         Cervical / Trunk  Assessment Cervical / Trunk Assessment: Normal   Communication Communication Communication: No difficulties   Cognition Arousal/Alertness: Awake/alert Behavior During Therapy: Flat affect Overall Cognitive Status: No family/caregiver present to determine baseline cognitive functioning                                                Home Living Family/patient expects to be discharged to:: Private residence Living Arrangements: Children Available Help at Discharge: Family Type of Home: Apartment Home Access: Level entry     Home Layout: One level     Bathroom Shower/Tub: Teacher, early years/pre: Standard     Home Equipment: None          Prior Functioning/Environment Level of Independence: Independent                 OT Problem List: Decreased strength;Decreased activity tolerance;Impaired balance (sitting and/or standing)      OT Treatment/Interventions: Self-care/ADL training;Patient/family education;DME and/or AE instruction    OT Goals(Current goals can be found in the care plan section) Acute Rehab OT Goals Patient Stated Goal: to go home OT Goal Formulation: With patient Time For Goal Achievement: 06/15/17  OT Frequency: Min 2X/week   Barriers to D/C:            Co-evaluation              AM-PAC PT "6 Clicks" Daily Activity     Outcome Measure Help from another person  eating meals?: None Help from another person taking care of personal grooming?: None Help from another person toileting, which includes using toliet, bedpan, or urinal?: A Little Help from another person bathing (including washing, rinsing, drying)?: A Little Help from another person to put on and taking off regular upper body clothing?: None Help from another person to put on and taking off regular lower body clothing?: A Little 6 Click Score: 21   End of Session Equipment Utilized During Treatment: Rolling walker Nurse Communication: Mobility  status  Activity Tolerance: Patient tolerated treatment well Patient left: Other (comment)(in bathroom with nursing students)  OT Visit Diagnosis: Unsteadiness on feet (R26.81)                Time: 1050-1110 OT Time Calculation (min): 20 min Charges:  OT General Charges $OT Visit: 1 Visit OT Evaluation $OT Eval Moderate Complexity: 1 Mod G-Codes:     Kari Baars, [place] [phone]  Payton Mccallum D 06/01/2017, 11:25 AM

## 2017-06-01 NOTE — Progress Notes (Addendum)
SATURATION QUALIFICATIONS: (This note is used to comply with regulatory documentation for home oxygen)  Patient Saturations on Room Air at Rest = 85%  Patient Saturations on Room Air while Ambulating = 82%  Patient Saturations on 2 Liters of oxygen while Ambulating = 95%  Please briefly explain why patient needs home oxygen: Patient desats without oxygen

## 2017-06-01 NOTE — Discharge Instructions (Signed)
LAPAROSCOPIC SURGERY: POST OP INSTRUCTIONS  ######################################################################  EAT Gradually transition to a high fiber diet with a fiber supplement over the next few weeks after discharge.  Start with a pureed / full liquid diet (see below)  WALK Walk an hour a day.  Control your pain to do that.    CONTROL PAIN Control pain so that you can walk, sleep, tolerate sneezing/coughing, go up/down stairs.  HAVE A BOWEL MOVEMENT DAILY Keep your bowels regular to avoid problems.  OK to try a laxative to override constipation.  OK to use an antidairrheal to slow down diarrhea.  Call if not better after 2 tries  CALL IF YOU HAVE PROBLEMS/CONCERNS Call if you are still struggling despite following these instructions. Call if you have concerns not answered by these instructions  ######################################################################    1. DIET: Follow a light bland diet the first 24 hours after arrival home, such as soup, liquids, crackers, etc.  Be sure to include lots of fluids daily.  Avoid fast food or heavy meals as your are more likely to get nauseated.  Eat a low fat the next few days after surgery.   2. Take your usually prescribed home medications unless otherwise directed. 3. PAIN CONTROL: a. Pain is best controlled by a usual combination of three different methods TOGETHER: i. Ice/Heat ii. Over the counter pain medication iii. Prescription pain medication b. Most patients will experience some swelling and bruising around the incisions.  Ice packs or heating pads (30-60 minutes up to 6 times a day) will help. Use ice for the first few days to help decrease swelling and bruising, then switch to heat to help relax tight/sore spots and speed recovery.  Some people prefer to use ice alone, heat alone, alternating between ice & heat.  Experiment to what works for you.  Swelling and bruising can take several weeks to resolve.   c. It is  helpful to take an over-the-counter pain medication regularly for the first few weeks.  Choose one of the following that works best for you: i. Naproxen (Aleve, etc)  Two 251m tabs twice a day ii. Ibuprofen (Advil, etc) Three 2053mtabs four times a day (every meal & bedtime) iii. Acetaminophen (Tylenol, etc) 500-65044mour times a day (every meal & bedtime) d. A  prescription for pain medication (such as oxycodone, hydrocodone, etc) should be given to you upon discharge.  Take your pain medication as prescribed.  i. If you are having problems/concerns with the prescription medicine (does not control pain, nausea, vomiting, rash, itching, etc), please call us [phone] see if we need to switch you to a different pain medicine that will work better for you and/or control your side effect better. ii. If you need a refill on your pain medication, please contact your pharmacy.  They will contact our office to request authorization. Prescriptions will not be filled after 5 pm or on week-ends. 4. Avoid getting constipated.  Between the surgery and the pain medications, it is common to experience some constipation.  Increasing fluid intake and taking a fiber supplement (such as Metamucil, Citrucel, FiberCon, MiraLax, etc) 1-2 times a day regularly will usually help prevent this problem from occurring.  A mild laxative (prune juice, Milk of Magnesia, MiraLax, etc) should be taken according to package directions if there are no bowel movements after 48 hours.   5. Watch out for diarrhea.  If you have many loose bowel movements, simplify your diet to bland foods & liquids for  a few days.  Stop any stool softeners and decrease your fiber supplement.  Switching to mild anti-diarrheal medications (Kayopectate, Pepto Bismol) can help.  If this worsens or does not improve, please call us. 6. Wash / shower every day.  You may shower over the dressings as they are waterproof.  Continue to shower over incision(s)  after the dressing is off. 7. Remove your waterproof bandages 5 days after surgery.  You may leave the incision open to air.  You may replace a dressing/Band-Aid to cover the incision for comfort if you wish.  8. ACTIVITIES as tolerated:   a. You may resume regular (light) daily activities beginning the next day--such as daily self-care, walking, climbing stairs--gradually increasing activities as tolerated.  If you can walk 30 minutes without difficulty, it is safe to try more intense activity such as jogging, treadmill, bicycling, low-impact aerobics, swimming, etc. b. Save the most intensive and strenuous activity for last such as sit-ups, heavy lifting, contact sports, etc  Refrain from any heavy lifting or straining until you are off narcotics for pain control.   c. DO NOT PUSH THROUGH PAIN.  Let pain be your guide: If it hurts to do something, don't do it.  Pain is your body warning you to avoid that activity for another week until the pain goes down. d. You may drive when you are no longer taking prescription pain medication, you can comfortably wear a seatbelt, and you can safely maneuver your car and apply brakes. e. Dennis Bast may have sexual intercourse when it is comfortable.  9. FOLLOW UP in our office a. Please call [facility] at [phone] to set up an appointment to see your surgeon in the office for a follow-up appointment approximately 2-3 weeks after your surgery. b. Make sure that you call for this appointment the day you arrive home to insure a convenient appointment time. 10. IF YOU HAVE DISABILITY OR FAMILY LEAVE FORMS, BRING THEM TO THE OFFICE FOR PROCESSING.  DO NOT GIVE THEM TO YOUR DOCTOR.   WHEN TO CALL us [phone]: 1. Poor pain control 2. Reactions / problems with new medications (rash/itching, nausea, etc)  3. Fever over 101.5 F (38.5 C) 4. Inability to urinate 5. Nausea and/or vomiting 6. Worsening swelling or bruising 7. Continued bleeding from incision. 8. Increased  pain, redness, or drainage from the incision   The clinic staff is available to answer your questions during regular business hours (8:30am-5pm).  Please dont hesitate to call and ask to speak to one of our nurses for clinical concerns.   If you have a medical emergency, go to the nearest emergency room or call 911.  A surgeon from [facility] is always on call at the [facility], [place], [place], [place], [place]  [address] ? MAIN: [phone] ? TOLL FREE: [phone] ?  FAX [phone] [email or web address]  GETTING TO GOOD BOWEL HEALTH.  ######################################################################  EAT Gradually transition to a high fiber diet with a fiber supplement over the next few weeks after discharge.  Start with a pureed / full liquid diet (see below)  WALK Walk an hour a day.  Control your pain to do that.    HAVE A BOWEL MOVEMENT DAILY Keep your bowels regular to avoid problems.  OK to try a laxative to override constipation.  OK to use an antidairrheal to slow down diarrhea.  Call if not better after 2 tries  CALL IF YOU HAVE  PROBLEMS/CONCERNS Call if you are still struggling despite following these instructions. Call if you have concerns not answered by these instructions  ######################################################################   Irregular bowel habits such as constipation and diarrhea can lead to many problems over time.  Having one soft bowel movement a day is the most important way to prevent further problems.  The anorectal canal is designed to handle stretching and feces to safely manage our ability to get rid of solid waste (feces, poop, stool) out of our body.  BUT, hard constipated stools can act like ripping concrete bricks and diarrhea can be a burning fire to this very sensitive area of our body, causing inflamed hemorrhoids, anal fissures, increasing risk is  perirectal abscesses, abdominal pain/bloating, an making irritable bowel worse.      The goal: ONE SOFT BOWEL MOVEMENT A DAY!  To have soft, regular bowel movements:   Drink plenty of fluids, consider 4-6 tall glasses of water a day.    Take plenty of fiber.  Fiber is the undigested part of plant food that passes into the colon, acting s natures broom to encourage bowel motility and movement.  Fiber can absorb and hold large amounts of water. This results in a larger, bulkier stool, which is soft and easier to pass. Work gradually over several weeks up to 6 servings a day of fiber (25g a day even more if needed) in the form of: o Vegetables -- Root (potatoes, carrots, turnips), leafy green (lettuce, salad greens, celery, spinach), or cooked high residue (cabbage, broccoli, etc) o Fruit -- Fresh (unpeeled skin & pulp), Dried (prunes, apricots, cherries, etc ),  or stewed ( applesauce)  o Whole grain breads, pasta, etc (whole wheat)  o Bran cereals   Bulking Agents -- This type of water-retaining fiber generally is easily obtained each day by one of the following:  o Psyllium bran -- The psyllium plant is remarkable because its ground seeds can retain so much water. This product is available as Metamucil, Konsyl, Effersyllium, Per Diem Fiber, or the less expensive generic preparation in drug and health food stores. Although labeled a laxative, it really is not a laxative.  o Methylcellulose -- This is another fiber derived from wood which also retains water. It is available as Citrucel. o Polyethylene Glycol - and artificial fiber commonly called Miralax or Glycolax.  It is helpful for people with gassy or bloated feelings with regular fiber o Flax Seed - a less gassy fiber than psyllium  No reading or other relaxing activity while on the toilet. If bowel movements take longer than 5 minutes, you are too constipated  AVOID CONSTIPATION.  High fiber and water intake usually takes care of this.   Sometimes a laxative is needed to stimulate more frequent bowel movements, but   Laxatives are not a good long-term solution as it can wear the colon out.  They can help jump-start bowels if constipated, but should be relied on constantly without discussing with your doctor o Osmotics (Milk of Magnesia, Fleets phosphosoda, Magnesium citrate, MiraLax, GoLytely) are safer than  o Stimulants (Senokot, Castor Oil, Dulcolax, Ex Lax)    o Avoid taking laxatives for more than 7 days in a row.   IF SEVERELY CONSTIPATED, try a Bowel Retraining Program: o Do not use laxatives.  o Eat a diet high in roughage, such as bran cereals and leafy vegetables.  o Drink six (6) ounces of prune or apricot juice each morning.  o Eat two (2) large servings of  stewed fruit each day.  o Take one (1) heaping tablespoon of a psyllium-based bulking agent twice a day. Use sugar-free sweetener when possible to avoid excessive calories.  o Eat a normal breakfast.  o Set aside 15 minutes after breakfast to sit on the toilet, but do not strain to have a bowel movement.  o If you do not have a bowel movement by the third day, use an enema and repeat the above steps.   Controlling diarrhea o Switch to liquids and simpler foods for a few days to avoid stressing your intestines further. o Avoid dairy products (especially milk & ice cream) for a short time.  The intestines often can lose the ability to digest lactose when stressed. o Avoid foods that cause gassiness or bloating.  Typical foods include beans and other legumes, cabbage, broccoli, and dairy foods.  Every person has some sensitivity to other foods, so listen to our body and avoid those foods that trigger problems for you. o Adding fiber (Citrucel, Metamucil, psyllium, Miralax) gradually can help thicken stools by absorbing excess fluid and retrain the intestines to act more normally.  Slowly increase the dose over a few weeks.  Too much fiber too soon can backfire and  cause cramping & bloating. o Probiotics (such as active yogurt, Align, etc) may help repopulate the intestines and colon with normal bacteria and calm down a sensitive digestive tract.  Most studies show it to be of mild help, though, and such products can be costly. o Medicines: - Bismuth subsalicylate (ex. Kayopectate, Pepto Bismol) every 30 minutes for up to 6 doses can help control diarrhea.  Avoid if pregnant. - Loperamide (Immodium) can slow down diarrhea.  Start with two tablets (4mg  total) first and then try one tablet every 6 hours.  Avoid if you are having fevers or severe pain.  If you are not better or start feeling worse, stop all medicines and call your doctor for advice o Call your doctor if you are getting worse or not better.  Sometimes further testing (cultures, endoscopy, X-ray studies, bloodwork, etc) may be needed to help diagnose and treat the cause of the diarrhea.  TROUBLESHOOTING IRREGULAR BOWELS 1) Avoid extremes of bowel movements (no bad constipation/diarrhea) 2) Miralax 17gm mixed in 8oz. water or juice-daily. May use BID as needed.  3) Gas-x,Phazyme, etc. as needed for gas & bloating.  4) Soft,bland diet. No spicy,greasy,fried foods.  5) Prilosec over-the-counter as needed  6) May hold gluten/wheat products from diet to see if symptoms improve.  7)  May try probiotics (Align, Activa, etc) to help calm the bowels down 7) If symptoms become worse call back immediately.   Home Oxygen Use, Adult When a medical condition keeps you from getting enough oxygen, your health care provider may instruct you to take extra oxygen at home. Your health care provider will let you know:  When to take oxygen.  For how long to take oxygen.  How quickly oxygen should be delivered (flow rate), in liters per minute (LPM or L/M).  Home oxygen can be given through:  A mask.  A nasal cannula. This is a device or tube that goes in the nostrils.  A transtracheal catheter. This is a  small, flexible tube placed in the trachea.  A tracheostomy. This is a surgically made opening in the trachea.  These devices are connected with tubing to an oxygen source, such as:  A tank. Tanks hold oxygen in gas form. They must be replaced when  the oxygen is used up.  A liquid oxygen device. This holds oxygen in liquid form. It must be replaced when the oxygen is used up.  An oxygen concentrator machine. This filters oxygen in the room. It uses electricity, so you must have a backup cylinder of oxygen in case the power goes out.  Supplies needed: To use oxygen, you will need:  A mask, nasal cannula, transtracheal catheter, or tracheostomy.  An oxygen tank, a liquid oxygen device, or an oxygen concentrator.  The tape that your health care provider recommends (optional).  If you use a transtracheal catheter and your prescribed flow rate is 1 LPM or greater, you will also need a humidifier. Risks and complications  Fire. This can happen if the oxygen is exposed to a heat source, flame, or spark.  Injury to skin. This can happen if liquid oxygen touches your skin.  Organ damage. This can happen if you get too little oxygen. How to use oxygen Your health care provider will show you how to use your oxygen device. Follow her or his instructions. They may look something like this: 1. Wash your hands. 2. If you use an oxygen concentrator, make sure it is plugged in. 3. Place one end of the tube into the port on the tank, device, or machine. 4. Place the mask over your nose and mouth. Or, place the nasal cannula and secure it with tape if instructed. If you use a tracheostomy or transtracheal catheter, connect it to the oxygen source as directed. 5. Make sure the liter-flow setting on the machine is at the level prescribed by your health care provider. 6. Turn on the machine or adjust the knob on the tank or device to the correct liter-flow setting. 7. When you are done, turn off and  unplug the machine, or turn the knob to OFF.  How to clean and care for the oxygen supplies Nasal cannula  Clean it with a warm, wet cloth daily or as needed.  Wash it with a liquid soap once a week.  Rinse it thoroughly once or twice a week.  Replace it every 2-4 weeks.  If you have an infection, such as a cold or pneumonia, change the cannula when you get better. Mask  Replace it every 2-4 weeks.  If you have an infection, such as a cold or pneumonia, change the mask when you get better. Humidifier bottle  Wash the bottle between each refill: ? Wash it with soap and warm water. ? Rinse it thoroughly. ? Disinfect it and its top. ? Air-dry it.  Make sure it is dry before you refill it. Oxygen concentrator  Clean the air filter at least twice a week according to directions from your home medical equipment and service company.  Wipe down the cabinet every day. To do this: ? Unplug the unit. ? Wipe down the cabinet with a damp cloth. ? Dry the cabinet. Other equipment  Change any extra tubing every 1-3 months.  Follow instructions from your health care provider about taking care of any other equipment. Safety tips Fire safety tips   Keep your oxygen and oxygen supplies at least 5 ft away from sources of heat, flames, and sparks at all times.  Do not allow smoking near your oxygen. Put up "no smoking" signs in your home.  Do not use materials that can burn (are flammable) while you use oxygen.  When you go to a restaurant with portable oxygen, ask to be seated in the  nonsmoking section.  Keep a Data processing manager close by. Let your fire department know that you have oxygen in your home.  Test your home smoke detectors regularly. General safety tips  If you use an oxygen cylinder, make sure it is in a stand or secured to an object that will not move (fixed object).  If you use liquid oxygen, make sure its container is kept upright.  If you use an oxygen  concentrator: ? Dance movement psychotherapist company. Make sure you are given priority service in the event that your power goes out. ? Avoid using extension cords, if possible. Follow these instructions at home:  Use oxygen only as told by your health care provider.  Do not use alcohol or other drugs that make you relax (sedating drugs) unless instructed. They can slow down your breathing rate and make it hard to get in enough oxygen.  Know how and when to order a refill of oxygen.  Always keep a spare tank of oxygen. Plan ahead for holidays when you may not be able to get a prescription filled.  Use water-based lubricants on your lips or nostrils. Do not use oil-based products like petroleum jelly.  To prevent skin irritation on your cheeks or behind your ears, tuck some gauze under the tubing. Contact a health care provider if:  You get headaches often.  You have shortness of breath.  You have a lasting cough.  You have anxiety.  You are sleepy all the time.  You develop an illness that affects your breathing.  You cannot exercise at your regular level.  You are restless.  You have difficult or irregular breathing, and it is getting worse.  You have a fever.  You have persistent redness under your nose. Get help right away if:  You are confused.  You have blue lips or fingernails.  You are struggling to breathe. This information is not intended to replace advice given to you by your health care provider. Make sure you discuss any questions you have with your health care provider. Document Released: 05/29/2003 Document Revised: 11/05/2015 Document Reviewed: 09/30/2015 Elsevier Interactive Patient Education  [employer].

## 2017-06-01 NOTE — Discharge Summary (Signed)
Physician Discharge Summary  Patient ID: Beth [name] MRN: [id number] DOB/AGE: [birth year]  57 y.o.  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   GERD   Anxiety   ARF (acute renal failure) ([place])   Morbid obesity with body mass index (BMI) of 40.0 to 49.9 (HCC)   CKD (chronic kidney disease) stage 2, GFR 60-89 ml/min   Hypomagnesemia   6 Days Post-Op  05/26/2017  POST-OPERATIVE DIAGNOSIS:   incarcerated anterior diaphragmatic hernia of morgagni   SURGERY:  05/26/2017  Procedure(s): XI ROBOTIC REDUTION AND REPAIR OF INCARCERATED ANTERIOR DIAPHRAGMATIC HERNIA OF MORGAGNI WITH MESH  SURGEON:    Surgeon(s): Michael Boston, MD Leighton Ruff, MD  Consults:  Internal medicine ([facility]) Nephrology ([facility])  Hospital Course:   The patient underwent the surgery above to reduce the anterior diaphragmatic hernia at that had her transverse colon incarcerated with it.Marland Kitchen  Postoperatively, her urine output was rather poor.  She is followed in the stepdown unit with aggressive fluid hydration.  Had a rise in her creatinine.  Medicine and nephrology were consulted.  Her creatinine stabilized.  Her urine output improved.  She diuresed.  She did have some issues of confusion with pain medications.  Dilaudid and gabapentin more strongly suspected as the culprits.  She accidentally pulled out her Keenan Bachelor drain going into her chest with no evidence of any pneumothorax.  Pain medicines adjusted.  Placed back on her insomnia medications.  Slept better and felt better.  She was able be  transferred to the floor.  The patient gradually mobilized and advanced to a solid diet.  Pain and other symptoms were treated aggressively.  Placed on a fiber bowel regimen.  She was able to wean down her oxygen down to 2 L.  However she still needed some supplemental oxygen so this was ordered at discharge.  By the time of discharge, the patient was walking well the hallways, eating food, having flatus.  Pain was well-controlled on an oral medications.  Based on meeting discharge criteria and continuing to recover, I felt it was safe for the patient to be discharged from the hospital to further recover with close followup. Postoperative recommendations were discussed in detail.  They are written as well.  Discharged Condition: fair  Disposition:  Follow-up Information    Michael Boston, MD. Schedule an appointment as soon as possible for a visit in 3 week(s).   Specialty:  General Surgery Why:  To follow up after your operation, To follow up after your hospital stay Contact information: [place] [address] [phone]        Curt Bears, MD. Schedule an appointment as soon as possible for a visit in 1 month(s).   Specialty:  Oncology Contact information: [place] [address] [phone]           07-Left Against Medical Advice/Left Without Being Seen/Elopement  Discharge Instructions    Call MD for:   Complete by:  As directed    Temperature > 101.75F   Call MD for:   Complete by:  As directed    FEVER > 101.5 F  (  temperatures < 101.5 F are not significant)   Call MD for:  extreme fatigue   Complete by:  As directed    Call MD for:  extreme fatigue   Complete by:  As directed    Call MD for:  hives   Complete by:  As directed    Call MD for:  persistant dizziness or light-headedness   Complete by:  As directed    Call MD for:  persistant nausea and vomiting   Complete by:  As directed    Call MD for:   persistant nausea and vomiting   Complete by:  As directed    Call MD for:  redness, tenderness, or signs of infection (pain, swelling, redness, odor or green/yellow discharge around incision site)   Complete by:  As directed    Call MD for:  redness, tenderness, or signs of infection (pain, swelling, redness, odor or green/yellow discharge around incision site)   Complete by:  As directed    Call MD for:  severe uncontrolled pain   Complete by:  As directed    Call MD for:  severe uncontrolled pain   Complete by:  As directed    Diet - low sodium heart healthy   Complete by:  As directed    Start with a bland diet such as soups, liquids, starchy foods, low fat foods, etc. the first few days at home. Gradually advance to a solid, low-fat, high fiber diet by the end of the first week at home.   Add a fiber supplement to your diet (Metamucil, etc) If you feel full, bloated, or constipated, stay on a full liquid or pureed/blenderized diet for a few days until you feel better and are no longer constipated.   Diet general   Complete by:  As directed    SEE ESOPHAGEAL SURGERY DIET INSTRUCTIONS  We using usually start you out on a pureed (blenderized) diet. Expect some sticking with swallowing over the next 1-2 months.   This is due to swelling around your esophagus at the wrap & hiatal diaphragm repair.  It will gradually ease off over the next few months.   Discharge instructions   Complete by:  As directed    Please see discharge instruction sheets.   Also refer to any handouts/printouts that may have been given from the [facility] surgery office (if you visited [place] there before surgery) Please call our office if you have any questions or concerns [phone]   Discharge instructions   Complete by:  As directed    See Discharge Instructions If you are not getting better after two weeks or are noticing you are getting worse, contact our office [phone] for further advice.  We may need to  adjust your medications, re-evaluate you in the office, send you to the emergency room, or see what other things we can do to help. The clinic staff is available to answer your questions during regular business hours (8:30am-5pm).  Please don't hesitate to call and ask to speak to one of our nurses for clinical concerns.    A surgeon from [facility] is always on call at the hospitals 24 hours/day If you have a medical emergency, go to the nearest emergency room or call 911.   Discharge wound care:   Complete by:  As directed    It is good for closed incision and even open wounds to be washed every day.  Shower every day.  Short baths are fine.  Wash the incisions and wounds clean with soap & water.    If you have a closed incision(s), wash the incision with soap & water every day.  You may leave closed incisions open to air if it is dry.   You may cover the incision with clean gauze & replace it after your daily shower for comfort. If you have skin tapes (Steristrips) or skin glue (Dermabond) on your incision, leave them in place.  They will fall off on their own like a scab.  You may trim any edges that curl up with clean scissors.  If you have staples, set up an appointment for them to be removed in the office in 10 days after surgery.  If you have a drain, wash around the skin exit site with soap & water and place a new dressing of gauze or band aid around the skin every day.  Keep the drain site clean & dry.   Driving Restrictions   Complete by:  As directed    No driving until off narcotics and can safely swerve away without pain during an emergency   Driving Restrictions   Complete by:  As directed    You may drive when: - you are no longer taking narcotic prescription pain medication - you can comfortably wear a seatbelt - you can safely make sudden turns/stops without pain.   Increase activity slowly   Complete by:  As directed    Increase activity slowly   Complete by:  As  directed    Start light daily activities --- self-care, walking, climbing stairs- beginning the day after surgery.  Gradually increase activities as tolerated.  Control your pain to be active.  Stop when you are tired.  Ideally, walk several times a day, eventually an hour a day.   Most people are back to most day-to-day activities in a few weeks.  It takes 4-6 weeks to get back to unrestricted, intense activity. If you can walk 30 minutes without difficulty, it is safe to try more intense activity such as jogging, treadmill, bicycling, low-impact aerobics, swimming, etc. Save the most intensive and strenuous activity for last (Usually 4-8 weeks after surgery) such as sit-ups, heavy lifting, contact sports, etc.  Refrain from any intense heavy lifting or straining until you are off narcotics for pain control.  You will have off days, but things should improve week-by-week. DO NOT PUSH THROUGH PAIN.  Let pain be your guide: If it hurts to do something, don't do it.   Lifting restrictions   Complete by:  As directed    Avoid heavy lifting initially, <20 pounds at first.   Do not push through pain.   You have no specific weight limit: If it hurts to do, DON'T DO IT.    If you feel no pain, you are not injuring anything.  Pain will protect you from injury.   Coughing and sneezing are far more stressful to your incision than any lifting.   Avoid resuming heavy lifting (>50 pounds) or other intense activity until off all narcotic pain medications.   When want to exercise more, give yourself 2 weeks to gradually get back to full intense exercise/activity.   Lifting restrictions   Complete by:  As directed    If you can walk 30 minutes without difficulty, it is safe to try more intense activity such as jogging, treadmill, bicycling, low-impact aerobics, swimming, etc. Save the most intensive and strenuous activity for last (Usually 4-8 weeks after surgery) such  as sit-ups, heavy lifting, contact sports,  etc.   Refrain from any intense heavy lifting or straining until you are off narcotics for pain control.  You will have off days, but things should improve week-by-week. DO NOT PUSH THROUGH PAIN.  Let pain be your guide: If it hurts to do something, don't do it.  Pain is your body warning you to avoid that activity for another week until the pain goes down.   May shower / Bathe   Complete by:  As directed    [place].  It is fine for dressings or wounds to be washed/rinsed.  Use gentle soap & water.  This will help the incisions and/or wounds get clean & minimize infection.   May shower / Bathe   Complete by:  As directed    May walk up steps   Complete by:  As directed    May walk up steps   Complete by:  As directed    Remove dressing in 72 hours   Complete by:  As directed    You have closed incisions: Shower and bathe over these incisions with soap and water every day.  It is OK to wash over the dressings: they are waterproof. Remove all surgical dressings on postoperative day #3.  You do not need to replace dressings over the closed incisions unless you feel more comfortable with a Band-Aid covering it.   Please call our office [phone] if you have further questions.   Sexual Activity Restrictions   Complete by:  As directed    Sexual activity as tolerated.  Do not push through pain.  Pain will protect you from injury.   Sexual Activity Restrictions   Complete by:  As directed    You may have sexual intercourse when it is comfortable. If it hurts to do something, stop.   Walk with assistance   Complete by:  As directed    Walk over an hour a day.  May use a walker/cane/companion to help with balance and stamina.      Allergies as of 06/01/2017      Reactions   Nsaids    CKD with bout of ARF   Meloxicam    Possible GI bleed      Medication List    STOP taking these medications   diphenoxylate-atropine 2.5-0.025 MG tablet Commonly known as:  LOMOTIL    doxycycline 100 MG tablet Commonly known as:  VIBRA-TABS     TAKE these medications   ALIGN PO Take 1 capsule by mouth daily.   amLODipine 5 MG tablet Commonly known as:  NORVASC TAKE 1 TABLET BY MOUTH EVERY DAY   calcium carbonate 1500 (600 Ca) MG Tabs tablet Commonly known as:  OSCAL Take 2 tablets by mouth daily.   clindamycin 1 % external solution Commonly known as:  CLEOCIN-T Apply topically 2 (two) times daily. Apply to face, chest , arms for rash What changed:    how much to take  when to take this  additional instructions   COMBIVENT RESPIMAT 20-100 MCG/ACT Aers respimat Generic drug:  Ipratropium-Albuterol INHALE 1 PUFF INTO THE LUNGS EVERY 6 HOURS AS NEEDED FOR WHEEZING   diphenhydrAMINE 25 MG tablet Commonly known as:  BENADRYL Take 50 mg by mouth 2 (two) times daily.   ergocalciferol 50000 units capsule Commonly known as:  VITAMIN D2 Take 1 capsule (50,000 Units total) by mouth once a week.   Fish Oil 1200 MG Caps Take 3,600 mg by mouth  daily.   furosemide 20 MG tablet Commonly known as:  LASIX TAKE 1 TABLET BY MOUTH DAILY AS NEEDED FOR SEVERE EDEMA   hydrochlorothiazide 25 MG tablet Commonly known as:  HYDRODIURIL TAKE 1 TABLET BY MOUTH EVERY DAY   hydrOXYzine 25 MG tablet Commonly known as:  ATARAX/VISTARIL TAKE 1 TABLET BY MOUTH EVERY 6 HOURS AS NEEDED FOR ITCHING What changed:  Another medication with the same name was removed. Continue taking this medication, and follow the directions you see here.   ketoconazole 2 % shampoo Commonly known as:  NIZORAL APPLY EXTERNALLY 2 TIMES A WEEK   loperamide 2 MG capsule Commonly known as:  IMODIUM Take 4 mg by mouth 4 (four) times daily as needed for diarrhea or loose stools.   Melatonin 5 MG Tabs Take 25-30 mg by mouth at bedtime as needed (sleep).   methocarbamol 750 MG tablet Commonly known as:  ROBAXIN Take 1 tablet (750 mg total) by mouth 4 (four) times daily.   mirtazapine 30 MG  tablet Commonly known as:  REMERON TAKE 1 TABLET(30 MG) BY MOUTH AT BEDTIME   mometasone 50 MCG/ACT nasal spray Commonly known as:  NASONEX Place 2 sprays into the nose daily.   NONFORMULARY OR COMPOUNDED ITEM Cbcd, cmp,ua   Dx hx fever, hx endocarditis,   ondansetron 4 MG tablet Commonly known as:  ZOFRAN Take 1 tablet (4 mg total) by mouth every 8 (eight) hours as needed for nausea.   Oxycodone HCl 10 MG Tabs Take 1 tablet (10 mg total) by mouth every 6 (six) hours as needed (for pain).   prochlorperazine 10 MG tablet Commonly known as:  COMPAZINE TAKE ONE TABLET BY MOUTH EVERY 6 HOURS AS NEEDED What changed:    how much to take  how to take this  when to take this   [name] 150 MG tablet Generic drug:  erlotinib TAKE 1 TABLET BY MOUTH DAILY ON AN EMPTY STOMACH 1 HOUR BEFORE OR 2 HOURS AFTER A MEAL   venlafaxine XR 75 MG 24 hr capsule Commonly known as:  EFFEXOR-XR TAKE 1 CAPSULE(75 MG) BY MOUTH 1 TIME            Durable Medical Equipment  (From admission, onward)        Start     Ordered   06/01/17 0842  For home use only DME Walker rolling  Once    Comments:  To help patient transfer and ambulate.  Physical / Occupational Therapy may change type of walker PRN.  Question:  Patient needs a walker to treat with the following condition  Answer:  Osteoarthropathy   06/01/17 0842       Discharge Care Instructions  (From admission, onward)        Start     Ordered   06/01/17 0000  Discharge wound care:    Comments:  It is good for closed incision and even open wounds to be washed every day.  Shower every day.  Short baths are fine.  Wash the incisions and wounds clean with soap & water.    If you have a closed incision(s), wash the incision with soap & water every day.  You may leave closed incisions open to air if it is dry.   You may cover the incision with clean gauze & replace it after your daily shower for comfort. If you have skin tapes  (Steristrips) or skin glue (Dermabond) on your incision, leave them in place.  They will fall off on  their own like a scab.  You may trim any edges that curl up with clean scissors.  If you have staples, set up an appointment for them to be removed in the office in 10 days after surgery.  If you have a drain, wash around the skin exit site with soap & water and place a new dressing of gauze or band aid around the skin every day.  Keep the drain site clean & dry.   06/01/17 0839      Significant Diagnostic Studies:  Results for orders placed or performed during the hospital encounter of 05/26/17 (from the past 72 hour(s))  CBC with Differential/Platelet     Status: Abnormal   Collection Time: 05/30/17  3:26 AM  Result Value Ref Range   WBC 11.9 (H) 4.0 - 10.5 K/uL   RBC 2.68 (L) 3.87 - 5.11 MIL/uL   Hemoglobin 8.2 (L) 12.0 - 15.0 g/dL   HCT 26.3 (L) 36.0 - 46.0 %   MCV 98.1 78.0 - 100.0 fL   MCH 30.6 26.0 - 34.0 pg   MCHC 31.2 30.0 - 36.0 g/dL   RDW 17.4 (H) 11.5 - 15.5 %   Platelets 318 150 - 400 K/uL   Neutrophils Relative % 79 %   Neutro Abs 9.4 (H) 1.7 - 7.7 K/uL   Lymphocytes Relative 10 %   Lymphs Abs 1.2 0.7 - 4.0 K/uL   Monocytes Relative 10 %   Monocytes Absolute 1.1 (H) 0.1 - 1.0 K/uL   Eosinophils Relative 1 %   Eosinophils Absolute 0.1 0.0 - 0.7 K/uL   Basophils Relative 0 %   Basophils Absolute 0.0 0.0 - 0.1 K/uL    Comment: Performed at [facility], [place] [address], [place], [place] [address]  Basic metabolic panel     Status: Abnormal   Collection Time: 05/30/17  3:26 AM  Result Value Ref Range   Sodium 144 135 - 145 mmol/L   Potassium 3.3 (L) 3.5 - 5.1 mmol/L   Chloride 98 (L) 101 - 111 mmol/L   CO2 35 (H) 22 - 32 mmol/L   Glucose, Bld 127 (H) 65 - 99 mg/dL   BUN 20 6 - 20 mg/dL   Creatinine, Ser 0.76 0.44 - 1.00 mg/dL   Calcium 7.0 (L) 8.9 - 10.3 mg/dL   GFR calc non Af Amer >60 >60 mL/min   GFR calc Af Amer >60 >60 mL/min    Comment:  (NOTE) The eGFR has been calculated using the CKD EPI equation. This calculation has not been validated in all clinical situations. eGFR's persistently <60 mL/min signify possible Chronic Kidney Disease.    Anion gap 11 5 - 15    Comment: Performed at [facility], [place] [address], [place], [place] [address]  Basic metabolic panel     Status: Abnormal   Collection Time: 05/31/17  4:16 AM  Result Value Ref Range   Sodium 144 135 - 145 mmol/L   Potassium 3.7 3.5 - 5.1 mmol/L   Chloride 99 (L) 101 - 111 mmol/L   CO2 32 22 - 32 mmol/L   Glucose, Bld 92 65 - 99 mg/dL   BUN 13 6 - 20 mg/dL   Creatinine, Ser 0.65 0.44 - 1.00 mg/dL   Calcium 7.7 (L) 8.9 - 10.3 mg/dL   GFR calc non Af Amer >60 >60 mL/min   GFR calc Af Amer >60 >60 mL/min    Comment: (NOTE) The eGFR has been calculated using the CKD  EPI equation. This calculation has not been validated in all clinical situations. eGFR's persistently <60 mL/min signify possible Chronic Kidney Disease.    Anion gap 13 5 - 15    Comment: Performed at [facility], [place] [address], [place], [place] [address]  CBC with Differential/Platelet     Status: Abnormal   Collection Time: 05/31/17  4:16 AM  Result Value Ref Range   WBC 11.0 (H) 4.0 - 10.5 K/uL   RBC 3.32 (L) 3.87 - 5.11 MIL/uL   Hemoglobin 10.0 (L) 12.0 - 15.0 g/dL   HCT 32.9 (L) 36.0 - 46.0 %   MCV 99.1 78.0 - 100.0 fL   MCH 30.1 26.0 - 34.0 pg   MCHC 30.4 30.0 - 36.0 g/dL   RDW 17.0 (H) 11.5 - 15.5 %   Platelets 315 150 - 400 K/uL   Neutrophils Relative % 64 %   Neutro Abs 7.1 1.7 - 7.7 K/uL   Lymphocytes Relative 18 %   Lymphs Abs 2.0 0.7 - 4.0 K/uL   Monocytes Relative 15 %   Monocytes Absolute 1.7 (H) 0.1 - 1.0 K/uL   Eosinophils Relative 3 %   Eosinophils Absolute 0.3 0.0 - 0.7 K/uL   Basophils Relative 0 %   Basophils Absolute 0.0 0.0 - 0.1 K/uL    Comment: Performed at [facility], [place] [address],  [place], [place] [address]  Basic metabolic panel     Status: Abnormal   Collection Time: 06/01/17  4:06 AM  Result Value Ref Range   Sodium 147 (H) 135 - 145 mmol/L   Potassium 3.2 (L) 3.5 - 5.1 mmol/L   Chloride 103 101 - 111 mmol/L   CO2 36 (H) 22 - 32 mmol/L   Glucose, Bld 101 (H) 65 - 99 mg/dL   BUN 10 6 - 20 mg/dL   Creatinine, Ser 0.61 0.44 - 1.00 mg/dL   Calcium 7.8 (L) 8.9 - 10.3 mg/dL   GFR calc non Af Amer >60 >60 mL/min   GFR calc Af Amer >60 >60 mL/min    Comment: (NOTE) The eGFR has been calculated using the CKD EPI equation. This calculation has not been validated in all clinical situations. eGFR's persistently <60 mL/min signify possible Chronic Kidney Disease.    Anion gap 8 5 - 15    Comment: Performed at [facility], [place] [address], [place], [place] [address]  Magnesium     Status: None   Collection Time: 06/01/17  4:06 AM  Result Value Ref Range   Magnesium 2.0 1.7 - 2.4 mg/dL    Comment: Performed at [facility], [place] [address], [place], [place] [address]   *Note: Due to a large number of results and/or encounters for the requested time period, some results have not been displayed. A complete set of results can be found in Results Review.    Dg Chest 2 View  Result Date: 05/29/2017 CLINICAL DATA:  Admitted x 3 days ago for robotic reduction and repair of incarcerated anterior diaphragmatic herni of Morgagni with mesh. Patient was consulted for the management of acute kidney injury. Hx lung ca, copd, morbid obesity. EXAM: CHEST - 2 VIEW COMPARISON:  05/28/2017 FINDINGS: Right lung opacity and right pleural effusion are without change from the previous day's study. Volume loss on the right is also stable. Mild atelectasis at the left lung base.  Left lung otherwise clear. No pneumothorax. There is a tube curled in the right lower hemithorax  stable from the prior exam. IMPRESSION: 1. No significant change from the most recent prior  study. Persistent right lung opacity and volume loss, consistent with atelectasis. Persistent right pleural effusion. Electronically Signed   By: Lajean Manes M.D.   On: 05/29/2017 14:08   Dg Chest Port 1 View  Result Date: 05/28/2017 CLINICAL DATA:  Short of breath and hypoxia. EXAM: PORTABLE CHEST 1 VIEW COMPARISON:  05/27/2017 FINDINGS: Patient rotated right. Mildly degraded exam due to AP portable technique and patient body habitus. Moderate cardiomegaly. Moderate right-sided pleural effusion. Given differences in technique, no significant change in right-sided aeration, with only minimal aerated right upper lobe remaining. No left-sided pleural fluid or pneumothorax. Suspect patchy left lower lobe airspace disease, similar. Drainage catheter projecting over the lower right hemithorax. Sclerotic osseous metastasis, better visualized yesterday. IMPRESSION: Given differences in technique, no significant change compared to 05/27/2017. Pleuroparenchymal opacification throughout the right hemithorax with patchy left base airspace disease. Electronically Signed   By: Abigail Miyamoto M.D.   On: 05/28/2017 17:01    Discharge Exam: Blood pressure 109/74, pulse 98, temperature 99.3 F (37.4 C), temperature source Oral, resp. rate 18, height '5\' 5"'  (1.651 m), weight 128.8 kg (284 lb), last menstrual period 09/28/2011, SpO2 100 %.  General: Pt awake/alert/oriented x4 in No acute distress Eyes: PERRL, normal EOM.  Sclera clear.  No icterus Neuro: CN II-XII intact w/o focal sensory/motor deficits. Lymph: No head/neck/groin lymphadenopathy Psych:  No delerium/psychosis/paranoia HENT: Normocephalic, Mucus membranes moist.  No thrush Neck: Supple, No tracheal deviation Chest: No chest wall pain w good excursion CV:  Pulses intact.  Regular rhythm MS: Normal AROM mjr joints.  No obvious deformity Abdomen: Soft.  Nondistended.  Mildly tender at incisions only.  No evidence of peritonitis.  No incarcerated  hernias. Ext:  SCDs BLE.  No mjr edema.  No cyanosis Skin: No petechiae / purpura  Past Medical History:  Diagnosis Date  . Anemia   . Anxiety   . Aortic atherosclerosis ([place])   . Arthritis of knee, degenerative   . Asthma   . Complication of anesthesia    difficulty waking up, throat feels wierd  . Depression   . Dermatitis   . Emphysema of lung ([place])   . Endocarditis    as teenager  . Fibromyalgia   . Flu-like symptoms 05/12/2016  . GERD (gastroesophageal reflux disease)   . Heart murmur   . History of MRSA infection   . History of radiation therapy 09/29/11-11/04/2011   right lung 2700cGy 15 sessions  . Hypertension   . Lung mass    R- ADENOCARCINOMA  . Morbid obesity ([place])   . Osseous metastasis ([place]) 09/20/11   per PET scan, Lung  . Pleural effusion 08/30/11  . Shortness of breath   . Sleep apnea    no longer using CPAP  . Tuberculosis    +PPD took treatments years ago, just had +skin test    Past Surgical History:  Procedure Laterality Date  . THORACENTESIS  09/02/11, 09/09/11   right-side pleural effusion  . TUBAL LIGATION  2002  . XI ROBOTIC ASSISTED PARASTOMAL HERNIA REPAIR N/A 05/26/2017   Procedure: XI ROBOTIC REDUTION AND REPAIR OF INCARCERATED ANTERIOR DIAPHRAGMATIC HERNIA OF MORGAGNI WITH MESH;  Surgeon: Michael Boston, MD;  Location: [facility];  Service: General;  Laterality: N/A;    Social History   Socioeconomic History  . Marital status: Single    Spouse name: Not on file  . Number of children: Not on  file  . Years of education: Not on file  . Highest education level: Not on file  Social Needs  . Financial resource strain: Not on file  . Food insecurity - worry: Not on file  . Food insecurity - inability: Not on file  . Transportation needs - medical: Not on file  . Transportation needs - non-medical: Not on file  Occupational History  . Not on file  Tobacco Use  . Smoking status: Former Smoker    Packs/day: 0.50    Years: 15.00    Pack years:  7.50    Types: Cigarettes    Last attempt to quit: 01/01/2011    Years since quitting: 6.4  . Smokeless tobacco: Never Used  Substance and Sexual Activity  . Alcohol use: Yes    Comment: per H&P, used to drink alcohol regularly  . Drug use: No  . Sexual activity: Not Currently    Birth control/protection: Surgical    Comment: still having menses due this week, menses started age 46, g1,p1, no hrt, has hot flashes  Other Topics Concern  . Not on file  Social History Narrative   Divorced, 1 son, worked for [employer]    Family History  Problem Relation Age of Onset  . Alcohol abuse Father   . Hypertension Mother   . Hypertension Maternal Aunt   . Hypertension Maternal Uncle   . Hypertension Maternal Grandmother   . Hypertension Maternal Grandfather   . Sarcoidosis Sister     Current Facility-Administered Medications  Medication Dose Route Frequency Provider Last Rate Last Dose  . acetaminophen (TYLENOL) tablet 1,000 mg  1,000 mg Oral TID WC & HS Michael Boston, MD   1,000 mg at 05/31/17 2026  . alum & mag hydroxide-simeth (MAALOX/MYLANTA) 200-200-20 MG/5ML suspension 30 mL  30 mL Oral Q6H PRN Michael Boston, MD      . amLODipine (NORVASC) tablet 5 mg  5 mg Oral BID Roney Jaffe, MD   5 mg at 05/31/17 2130  . bisacodyl (DULCOLAX) suppository 10 mg  10 mg Rectal Daily PRN Michael Boston, MD      . calcium carbonate (OS-CAL - dosed in mg of elemental calcium) tablet 1,000 mg of elemental calcium  2 tablet Oral Q breakfast Berton Mount, RPH   1,000 mg of elemental calcium at 05/31/17 0852  . diphenhydrAMINE (BENADRYL) capsule 25 mg  25 mg Oral Q8H PRN Blount, Scarlette Shorts T, NP      . ergocalciferol (VITAMIN D2) capsule 50,000 Units  50,000 Units Oral Weekly Michael Boston, MD   50,000 Units at 05/31/17 1101  . erlotinib ([name]) tablet 150 mg  150 mg Oral Daily Michael Boston, MD   150 mg at 06/01/17 0739  . feeding supplement (ENSURE SURGERY) liquid 237 mL  237 mL Oral BID BM Michael Boston,  MD   237 mL at 05/31/17 1101  . fentaNYL (SUBLIMAZE) injection 25-50 mcg  25-50 mcg Intravenous Q1H PRN Michael Boston, MD   50 mcg at 05/30/17 2113  . fluticasone (FLONASE) 50 MCG/ACT nasal spray 2 spray  2 spray Each Nare Daily Michael Boston, MD   2 spray at 05/27/17 4268  . haloperidol lactate (HALDOL) injection 2-5 mg  2-5 mg Intravenous Q6H PRN Michael Boston, MD      . hydrALAZINE (APRESOLINE) injection 5-20 mg  5-20 mg Intravenous Q4H PRN Michael Boston, MD      . hydrocortisone (ANUSOL-HC) 2.5 % rectal cream 1 application  1 application Topical QID PRN Michael Boston,  MD      . hydrocortisone cream 1 % 1 application  1 application Topical TID PRN Michael Boston, MD      . ipratropium-albuterol (DUONEB) 0.5-2.5 (3) MG/3ML nebulizer solution 3 mL  3 mL Nebulization Q6H PRN Michael Boston, MD   3 mL at 05/29/17 2321  . lip balm (CARMEX) ointment 1 application  1 application Topical BID Michael Boston, MD   1 application at 02/77/41 2131  . LORazepam (ATIVAN) injection 0.5-1 mg  0.5-1 mg Intravenous Q8H PRN Michael Boston, MD      . magic mouthwash  15 mL Oral QID PRN Michael Boston, MD      . menthol-cetylpyridinium (CEPACOL) lozenge 3 mg  1 lozenge Oral PRN Michael Boston, MD      . methocarbamol (ROBAXIN) tablet 750 mg  750 mg Oral QID Michael Boston, MD   750 mg at 05/31/17 2027  . mirtazapine (REMERON) tablet 15 mg  15 mg Oral Ardeen Fillers, MD   15 mg at 05/31/17 2130  . ondansetron (ZOFRAN) injection 4 mg  4 mg Intravenous Q6H PRN Michael Boston, MD   4 mg at 05/30/17 2113   Or  . ondansetron (ZOFRAN) 8 mg in sodium chloride 0.9 % 50 mL IVPB  8 mg Intravenous Q6H PRN Michael Boston, MD      . oxyCODONE (Oxy IR/ROXICODONE) immediate release tablet 5-15 mg  5-15 mg Oral Q4H PRN Michael Boston, MD   10 mg at 05/31/17 2878  . phenol (CHLORASEPTIC) mouth spray 1-2 spray  1-2 spray Mouth/Throat PRN Michael Boston, MD      . polyethylene glycol (MIRALAX / GLYCOLAX) packet 17 g  17 g Oral Daily PRN Michael Boston, MD      . potassium chloride SA (K-DUR,KLOR-CON) CR tablet 40 mEq  40 mEq Oral Daily Michael Boston, MD      . psyllium (HYDROCIL/METAMUCIL) packet 1 packet  1 packet Oral Daily Michael Boston, MD      . simethicone (MYLICON) chewable tablet 40 mg  40 mg Oral Q6H PRN Michael Boston, MD      . venlafaxine XR (EFFEXOR-XR) 24 hr capsule 75 mg  75 mg Oral Q breakfast Michael Boston, MD   75 mg at 05/31/17 6767     Allergies  Allergen Reactions  . Nsaids     CKD with bout of ARF  . Meloxicam     Possible GI bleed    Signed: Morton Peters, M.D., F.A.C.S. Gastrointestinal and Minimally Invasive [facility] [address] [place], [place] [address] [phone] Main / Paging   06/01/2017, 8:42 AM

## 2017-06-02 LAB — CULTURE, BLOOD (ROUTINE X 2)
CULTURE: NO GROWTH
SPECIAL REQUESTS: ADEQUATE

## 2017-06-03 DIAGNOSIS — C3490 Malignant neoplasm of unspecified part of unspecified bronchus or lung: Secondary | ICD-10-CM | POA: Diagnosis not present

## 2017-06-03 LAB — CULTURE, BLOOD (ROUTINE X 2)
CULTURE: NO GROWTH
SPECIAL REQUESTS: ADEQUATE

## 2017-06-13 ENCOUNTER — Telehealth: Payer: Self-pay | Admitting: Medical Oncology

## 2017-06-13 NOTE — Telephone Encounter (Signed)
Asking about ct scan,. It is in review.

## 2017-06-15 ENCOUNTER — Other Ambulatory Visit: Payer: Self-pay | Admitting: *Deleted

## 2017-06-15 DIAGNOSIS — Z5111 Encounter for antineoplastic chemotherapy: Secondary | ICD-10-CM

## 2017-06-15 DIAGNOSIS — I10 Essential (primary) hypertension: Secondary | ICD-10-CM

## 2017-06-15 DIAGNOSIS — C3490 Malignant neoplasm of unspecified part of unspecified bronchus or lung: Secondary | ICD-10-CM

## 2017-06-15 MED ORDER — OXYCODONE HCL 10 MG PO TABS: 10.0000 mg | ORAL_TABLET | Freq: Four times a day (QID) | ORAL | 0 refills | Status: DC | PRN

## 2017-06-15 MED FILL — oxyCODONE HCL 10 MG TABS: 10 | 22 days supply | Qty: 90 | Fill #0

## 2017-06-15 NOTE — Telephone Encounter (Signed)
Rx for Oxycodone ready for pick up. lmovm for pt.

## 2017-07-04 ENCOUNTER — Other Ambulatory Visit: Payer: Self-pay | Admitting: Internal Medicine

## 2017-07-04 ENCOUNTER — Telehealth: Payer: Self-pay | Admitting: *Deleted

## 2017-07-04 ENCOUNTER — Ambulatory Visit (HOSPITAL_COMMUNITY): Payer: Medicare Other

## 2017-07-04 ENCOUNTER — Telehealth: Payer: Self-pay | Admitting: Internal Medicine

## 2017-07-04 ENCOUNTER — Other Ambulatory Visit: Payer: Medicare Other

## 2017-07-04 DIAGNOSIS — C3490 Malignant neoplasm of unspecified part of unspecified bronchus or lung: Secondary | ICD-10-CM

## 2017-07-04 NOTE — Telephone Encounter (Signed)
"  I forgot when my appointment is today.  Could you confirm?"  Provided today's appointment information for 11:30 am lab followed by 12:30 pm scan.  Asked for 30 minute early arrival for registration.  Wednesday  10:45 MD follow up with injection.  Denies  further needs or questions at this time.

## 2017-07-04 NOTE — Telephone Encounter (Signed)
Patient called to reschedule  °

## 2017-07-06 ENCOUNTER — Ambulatory Visit: Payer: Medicare Other | Admitting: Internal Medicine

## 2017-07-06 ENCOUNTER — Ambulatory Visit: Payer: Medicare Other

## 2017-07-11 ENCOUNTER — Other Ambulatory Visit: Payer: Self-pay | Admitting: Family Medicine

## 2017-07-12 ENCOUNTER — Other Ambulatory Visit: Payer: Self-pay | Admitting: Medical Oncology

## 2017-07-12 DIAGNOSIS — C3490 Malignant neoplasm of unspecified part of unspecified bronchus or lung: Secondary | ICD-10-CM

## 2017-07-12 DIAGNOSIS — Z5111 Encounter for antineoplastic chemotherapy: Secondary | ICD-10-CM

## 2017-07-12 DIAGNOSIS — I10 Essential (primary) hypertension: Secondary | ICD-10-CM

## 2017-07-12 MED ORDER — OXYCODONE HCL 10 MG PO TABS: 10.0000 mg | ORAL_TABLET | Freq: Four times a day (QID) | ORAL | 0 refills | Status: AC | PRN

## 2017-07-12 MED FILL — oxyCODONE HCL 10 MG TABS: 10 | 22 days supply | Qty: 90 | Fill #0

## 2017-07-12 NOTE — Progress Notes (Signed)
Pt notified to pick up refill.

## 2017-07-13 ENCOUNTER — Ambulatory Visit (HOSPITAL_COMMUNITY): Payer: Medicare Other

## 2017-07-13 ENCOUNTER — Other Ambulatory Visit: Payer: Medicare Other

## 2017-07-21 ENCOUNTER — Telehealth: Payer: Self-pay | Admitting: Medical Oncology

## 2017-07-21 NOTE — Telephone Encounter (Signed)
err

## 2017-07-21 NOTE — Telephone Encounter (Signed)
Requests Niacin rx from [name]. "It is Vit b3 and helps with cholesterol and cancer pts live longer". Per Julien Nordmann pt can take it .  CT scan - Does not want to drink contrast for scan. Radiology aware.

## 2017-07-22 ENCOUNTER — Other Ambulatory Visit: Payer: Self-pay | Admitting: Medical Oncology

## 2017-07-22 ENCOUNTER — Encounter (HOSPITAL_COMMUNITY): Payer: Self-pay

## 2017-07-22 ENCOUNTER — Other Ambulatory Visit: Payer: Self-pay | Admitting: Internal Medicine

## 2017-07-22 ENCOUNTER — Inpatient Hospital Stay: Payer: Medicare Other | Attending: Internal Medicine

## 2017-07-22 ENCOUNTER — Telehealth: Payer: Self-pay | Admitting: Medical Oncology

## 2017-07-22 ENCOUNTER — Ambulatory Visit (HOSPITAL_COMMUNITY)
Admission: RE | Admit: 2017-07-22 | Discharge: 2017-07-22 | Disposition: A | Discharge status: Home / Self Care | Payer: Medicare Other | Source: Ambulatory Visit | Attending: Internal Medicine | Admitting: Internal Medicine

## 2017-07-22 DIAGNOSIS — J90 Pleural effusion, not elsewhere classified: Secondary | ICD-10-CM | POA: Diagnosis not present

## 2017-07-22 DIAGNOSIS — R918 Other nonspecific abnormal finding of lung field: Secondary | ICD-10-CM | POA: Diagnosis not present

## 2017-07-22 DIAGNOSIS — C7951 Secondary malignant neoplasm of bone: Secondary | ICD-10-CM | POA: Diagnosis not present

## 2017-07-22 DIAGNOSIS — E876 Hypokalemia: Secondary | ICD-10-CM

## 2017-07-22 DIAGNOSIS — I700 Atherosclerosis of aorta: Secondary | ICD-10-CM | POA: Insufficient documentation

## 2017-07-22 DIAGNOSIS — K4090 Unilateral inguinal hernia, without obstruction or gangrene, not specified as recurrent: Secondary | ICD-10-CM | POA: Diagnosis not present

## 2017-07-22 DIAGNOSIS — C3490 Malignant neoplasm of unspecified part of unspecified bronchus or lung: Secondary | ICD-10-CM | POA: Diagnosis not present

## 2017-07-22 DIAGNOSIS — C3491 Malignant neoplasm of unspecified part of right bronchus or lung: Secondary | ICD-10-CM | POA: Insufficient documentation

## 2017-07-22 LAB — CBC WITH DIFFERENTIAL (CANCER CENTER ONLY)
Basophils Absolute: 0 10*3/uL (ref 0.0–0.1)
Basophils Relative: 0 %
EOS ABS: 0.1 10*3/uL (ref 0.0–0.5)
EOS PCT: 2 %
HCT: 33 % — ABNORMAL LOW (ref 34.8–46.6)
HEMOGLOBIN: 10.6 g/dL — AB (ref 11.6–15.9)
LYMPHS ABS: 2.7 10*3/uL (ref 0.9–3.3)
Lymphocytes Relative: 33 %
MCH: 29.4 pg (ref 25.1–34.0)
MCHC: 32.1 g/dL (ref 31.5–36.0)
MCV: 91.7 fL (ref 79.5–101.0)
MONO ABS: 0.5 10*3/uL (ref 0.1–0.9)
MONOS PCT: 6 %
Neutro Abs: 4.8 10*3/uL (ref 1.5–6.5)
Neutrophils Relative %: 59 %
PLATELETS: 353 10*3/uL (ref 145–400)
RBC: 3.6 MIL/uL — ABNORMAL LOW (ref 3.70–5.45)
RDW: 17.3 % — ABNORMAL HIGH (ref 11.2–14.5)
WBC Count: 8.2 10*3/uL (ref 3.9–10.3)

## 2017-07-22 LAB — CMP (CANCER CENTER ONLY)
ALT: 10 U/L (ref 0–55)
AST: 20 U/L (ref 5–34)
Albumin: 3.6 g/dL (ref 3.5–5.0)
Alkaline Phosphatase: 70 U/L (ref 40–150)
Anion gap: 9 (ref 3–11)
BUN: 12 mg/dL (ref 7–26)
CO2: 32 mmol/L — AB (ref 22–29)
CREATININE: 0.83 mg/dL (ref 0.60–1.10)
Calcium: 9.4 mg/dL (ref 8.4–10.4)
Chloride: 101 mmol/L (ref 98–109)
GFR, Est AFR Am: >60 mL/min (ref >60)
GFR, Estimated: >60 mL/min (ref >60)
GLUCOSE: 101 mg/dL (ref 70–140)
Potassium: 3 mmol/L — CL (ref 3.5–5.1)
SODIUM: 142 mmol/L (ref 136–145)
Total Bilirubin: 1.2 mg/dL (ref 0.2–1.2)
Total Protein: 8 g/dL (ref 6.4–8.3)

## 2017-07-22 MED ORDER — POTASSIUM CHLORIDE CRYS ER 20 MEQ PO TBCR: 20.0000 meq | EXTENDED_RELEASE_TABLET | Freq: Every day | ORAL | 0 refills | Status: DC

## 2017-07-22 MED ORDER — IOHEXOL 300 MG/ML  SOLN
75.0000 mL | Freq: Once | INTRAMUSCULAR | Status: AC | PRN
  Administered 2017-07-22: 75 mL via INTRAVENOUS

## 2017-07-22 NOTE — Telephone Encounter (Signed)
Hypokalemia-kdur called to local pharmacy and pt notfied.

## 2017-07-25 ENCOUNTER — Encounter: Payer: Self-pay | Admitting: *Deleted

## 2017-07-25 ENCOUNTER — Telehealth: Payer: Self-pay | Admitting: Medical Oncology

## 2017-07-25 NOTE — Progress Notes (Signed)
Oncology Nurse Navigator Documentation  Oncology Nurse Navigator Flowsheets 07/25/2017  Navigator Location [facility]  Navigator Encounter Type Other/per Dr. Julien Nordmann, I cancelled all of Ms. Gura appts. She passed away.  I will update HIM to help identify chart as patient passed.   Barriers/Navigation Needs Coordination of Care  Interventions Coordination of Care  Coordination of Care Other  Acuity Level 1  Time Spent with Patient 15
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
## 2017-07-25 NOTE — Telephone Encounter (Signed)
Her scan actually looked very good.  I am not sure of the cause of this but likely cardiac arrest.  I will be happy to meet with him briefly if needed.

## 2017-07-26 ENCOUNTER — Ambulatory Visit: Payer: Medicare Other

## 2017-07-26 ENCOUNTER — Ambulatory Visit: Payer: Medicare Other | Admitting: Internal Medicine

## 2017-07-27 ENCOUNTER — Other Ambulatory Visit: Payer: Self-pay | Admitting: Internal Medicine

## 2017-07-27 DIAGNOSIS — C3490 Malignant neoplasm of unspecified part of unspecified bronchus or lung: Secondary | ICD-10-CM

## 2017-08-01 ENCOUNTER — Telehealth: Payer: Self-pay | Admitting: Internal Medicine

## 2017-08-01 ENCOUNTER — Telehealth: Payer: Self-pay | Admitting: Medical Oncology

## 2017-08-01 NOTE — Telephone Encounter (Signed)
Wants to talk to [facility] re scan and pick up her records. I returned call to his mobile and it rang one time . Unable to leave message. Julien Nordmann said he can meet with him briefly roughed 1130 today .

## 2017-08-01 NOTE — Telephone Encounter (Signed)
Printed ROI on 08/01/17 for pick up, Release ID [id number]

## 2017-08-16 ENCOUNTER — Other Ambulatory Visit: Payer: Self-pay | Admitting: Family Medicine

## 2017-08-20 DIAGNOSIS — 419620001 Death: Secondary | SNOMED CT | POA: Diagnosis not present

## 2017-08-20 DEATH — deceased

## 2017-09-16 IMAGING — CT CT ANGIO CHEST
2 of 6 series · 18 of 36 positions shown · IV contrast (isovue)
Comparison: 08/07/2015

CLINICAL DATA: Shortness of breath over 1 month, worsening. Stage
IV lung cancer. Questionable pulmonary embolus in the left lower
lobe on a none angiogram CT yesterday. Today' s exam is for
confirmation and characterization of any pulmonary embolus.

EXAM:
CT ANGIOGRAPHY CHEST WITH CONTRAST
TECHNIQUE: Multidetector CT imaging of the chest was performed using the
standard protocol during bolus administration of intravenous
contrast. Multiplanar CT image reconstructions and MIPs were
obtained to evaluate the vascular anatomy.
CONTRAST:  67 cc Isovue 370

[Series 6: pe thins @ 1mm · axial · 0.70mm/px · z∈[-314,-30]mm · 17 of 316 slices shown]
[im 16/316  lung]
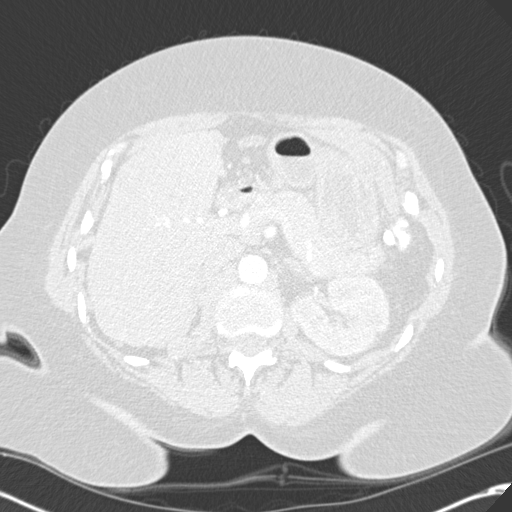
[im 32/316  mediastinal]
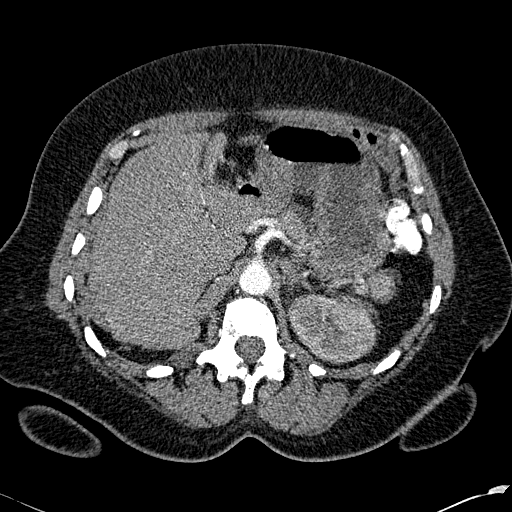
[im 48/316  lung]
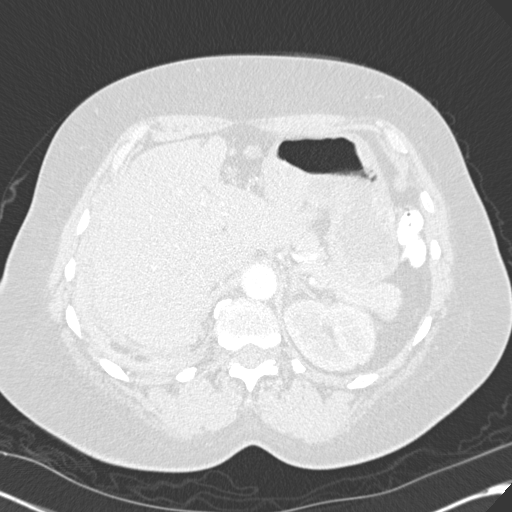
[im 64/316  mediastinal]
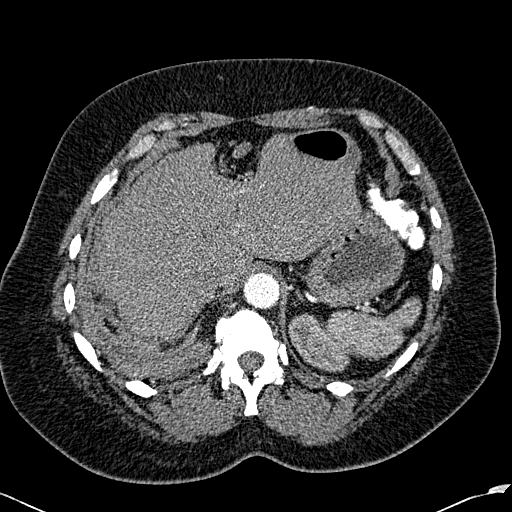
[im 95/316  lung]
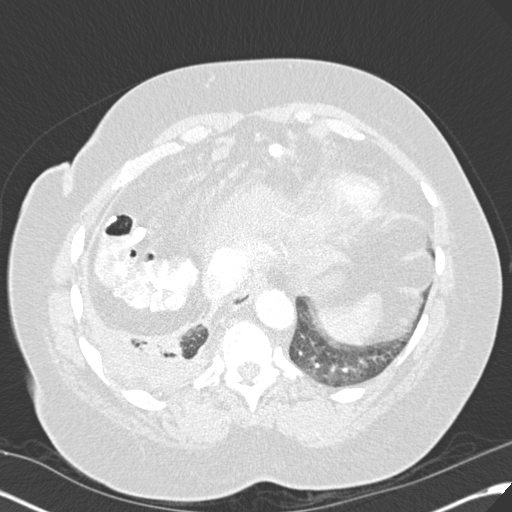
[im 111/316  mediastinal]
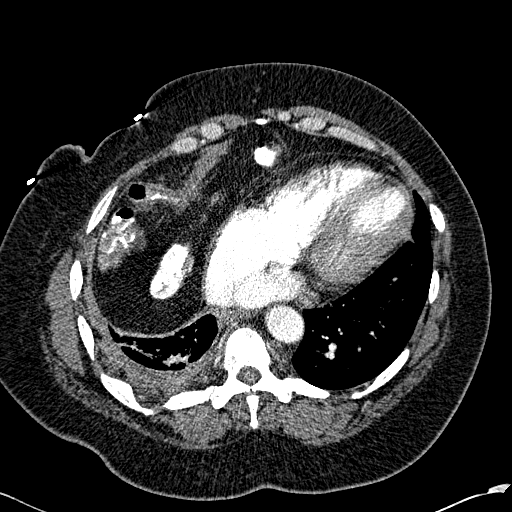
[im 127/316  lung]
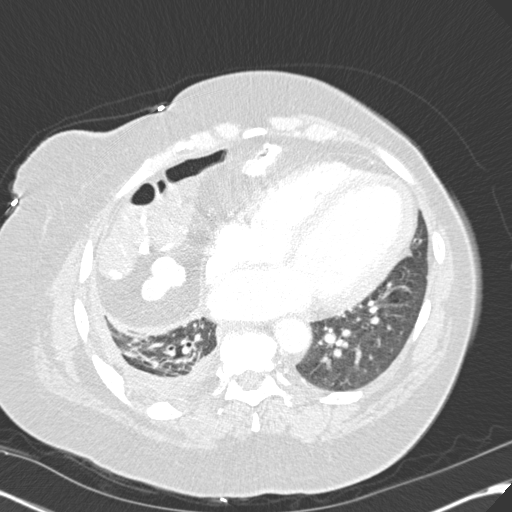
[im 142/316  mediastinal]
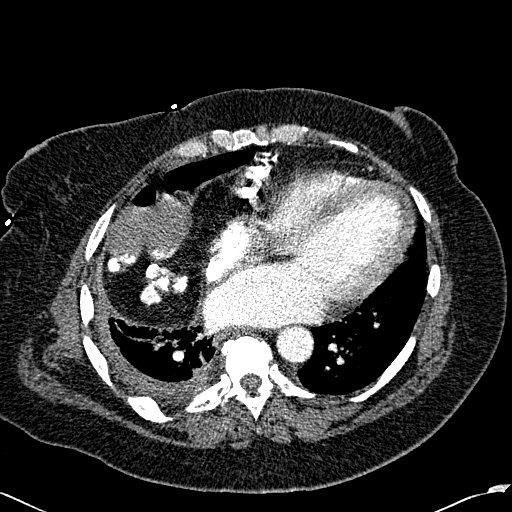
[im 158/316  lung]
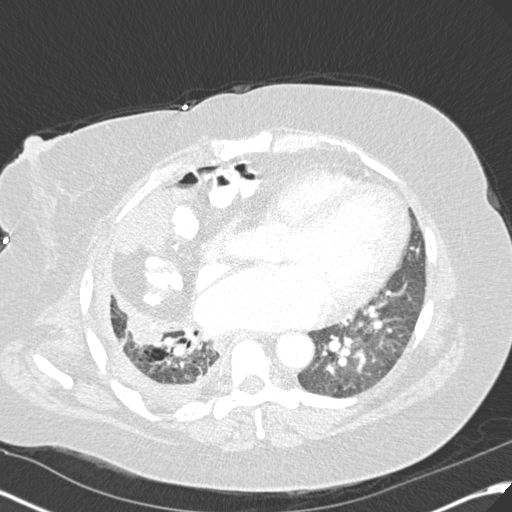
[im 174/316  mediastinal]
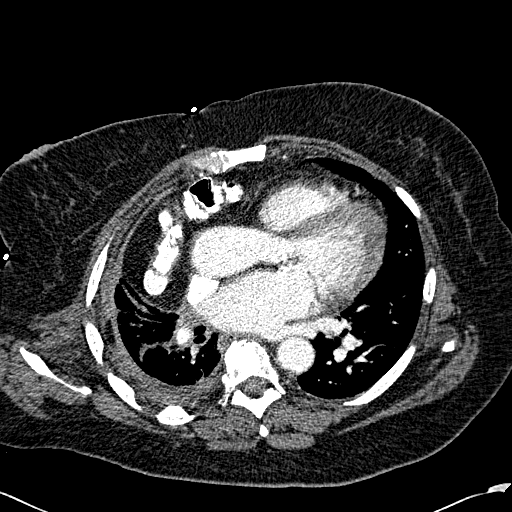
[im 190/316  lung]
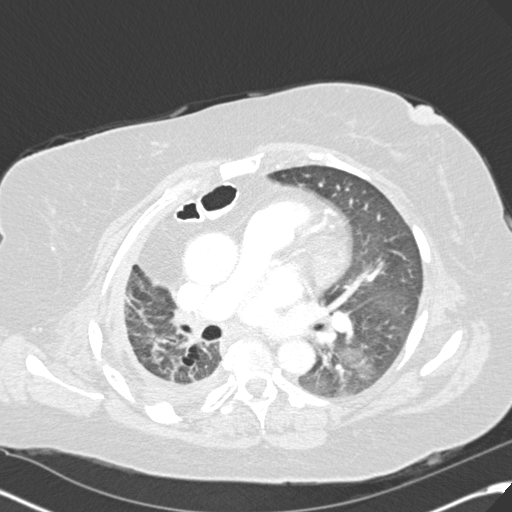
[im 205/316  mediastinal]
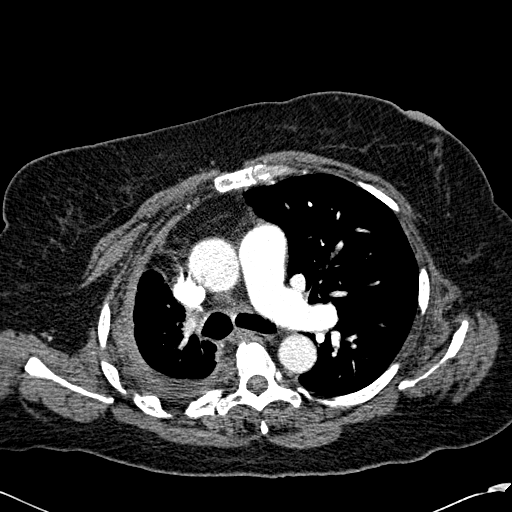
[im 221/316  lung]
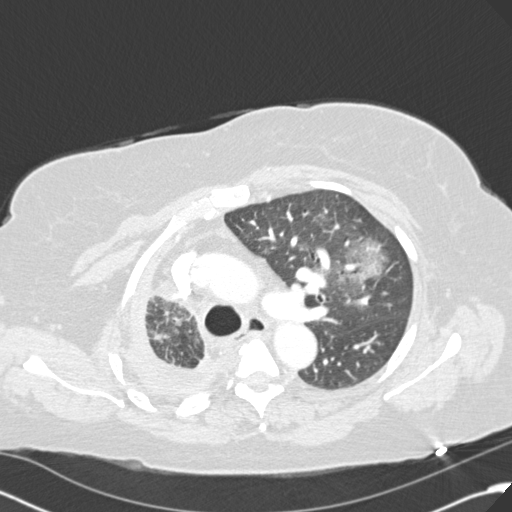
[im 253/316  mediastinal]
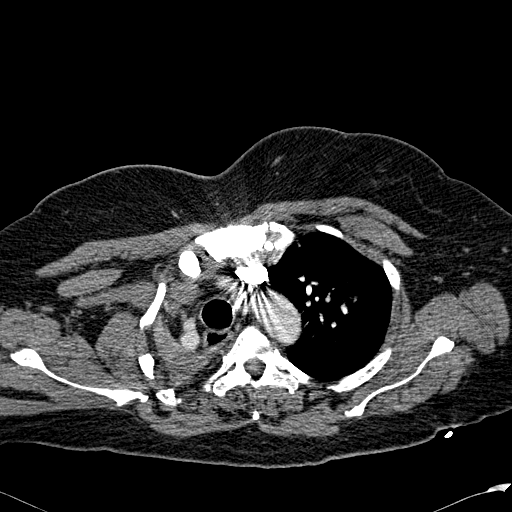
[im 268/316  lung]
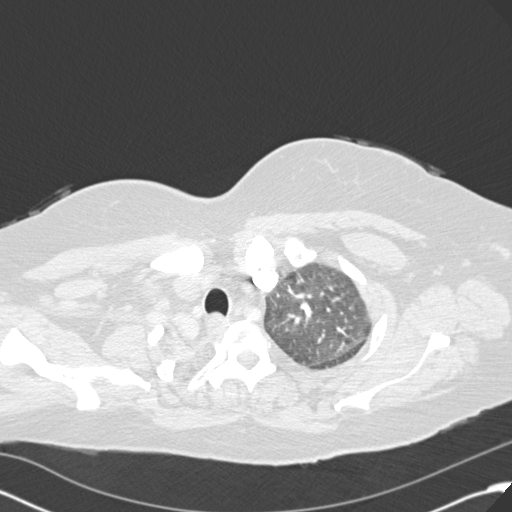
[im 284/316  mediastinal]
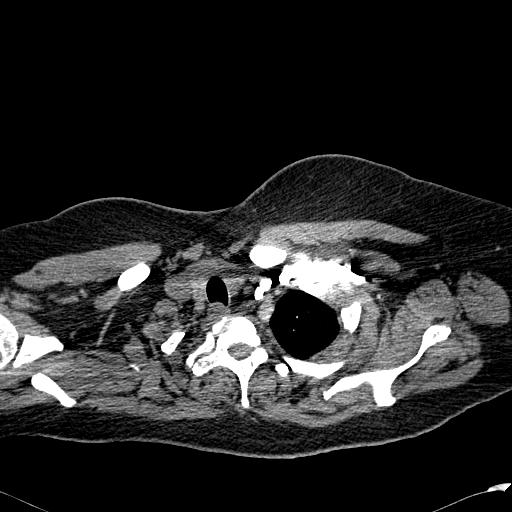
[im 300/316  lung]
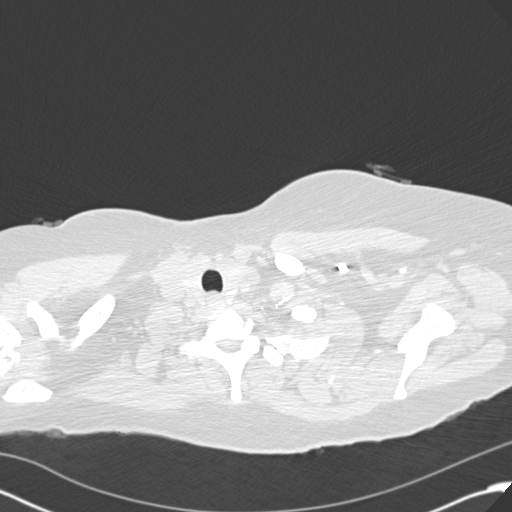

[Series 602: mpr cor · coronal · 0.70mm/px · 1 of 121 slices shown]
[im 61/121  mediastinal]
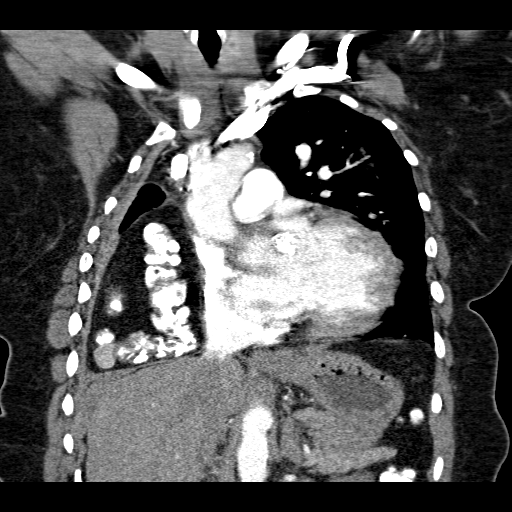

[18 of 36 positions shown; findings below may reference images not displayed]

FINDINGS: Despite efforts by the technologist and patient, motion artifact is
present on today's exam and could not be eliminated. This reduces
exam sensitivity and specificity.

Mediastinum/Nodes: Right hemithoracic volume loss with shift of
cardiac and mediastinal structures to the right. Calcification along
the posterior margin of the aortic valve. Cardiomegaly.

Vague hypodensity in right upper lobe pulmonary artery, image 39/4,
not appreciable yesterday, probably related to slow flow rather than
embolus.

In the area of concern from yesterday's exam, specifically in the
vicinity of image 34 series 2 from that exam which corresponds
approximately with image 135 of series 6 of the current exam, there
is no filling defect in the left lower lobe pulmonary artery.

Right anterior hemidiaphragmatic hernia containing adipose tissue,
colon, and small bowel loops.

Lungs/Pleura: Stable extensive volume loss in the right hemithorax
with nodular scarring inferiorly in the right lung which is
unchanged. Emphysema. Stable ground-glass opacities scattered in the
left lung.

Upper abdomen: Small central hypodense lesion in the liver measuring
6 mm on image 86 series 4, no change, technically nonspecific.

Musculoskeletal: Diffuse sclerotic osseous metastatic disease, not
appreciably changed.

Review of the MIP images confirms the above findings.
IMPRESSION: 1. No embolus is identified. Specifically in the area of concern in
the left lower lobe, there is no filling defect today. The
appearance of possible filling defect on the prior exam may have
been from slow flow or other vascular phenomenon.
2. In the right pulmonary artery, particularly in the upper and
middle lobe, there is very vague hypodensity in the pulmonary
arterial tree which was not readily apparent yesterday. I favor this
is being from slow flow/vascular phenomenon probably related to
hypoventilation of the right lung. I am very doubtful that this
represents embolus. It does not have the sharply defined appearance
than I would expect for acute embolus.
3. Exam is otherwise stable from yesterday.

## 2018-03-31 ENCOUNTER — Telehealth: Payer: Self-pay | Admitting: *Deleted

## 2018-03-31 NOTE — Telephone Encounter (Signed)
Medical records faxed to [facility]; RID [id number]
